# Patient Record
Sex: Female | Born: 1960 | Race: White | Hispanic: No | Smoking: Former smoker
Health system: Southern US, Community
[De-identification: ages and names within clinical notes are randomized; demographics above are authoritative.]

## PROBLEM LIST (undated history)

## (undated) ENCOUNTER — Ambulatory Visit: Admission: EM | Payer: Medicaid Other

## (undated) DIAGNOSIS — F1210 Cannabis abuse, uncomplicated: Secondary | ICD-10-CM

## (undated) DIAGNOSIS — E785 Hyperlipidemia, unspecified: Secondary | ICD-10-CM

## (undated) DIAGNOSIS — I471 Supraventricular tachycardia, unspecified: Secondary | ICD-10-CM

## (undated) DIAGNOSIS — R4189 Other symptoms and signs involving cognitive functions and awareness: Secondary | ICD-10-CM

## (undated) DIAGNOSIS — K219 Gastro-esophageal reflux disease without esophagitis: Secondary | ICD-10-CM

## (undated) DIAGNOSIS — M1990 Unspecified osteoarthritis, unspecified site: Secondary | ICD-10-CM

## (undated) DIAGNOSIS — I10 Essential (primary) hypertension: Secondary | ICD-10-CM

## (undated) DIAGNOSIS — M48061 Spinal stenosis, lumbar region without neurogenic claudication: Secondary | ICD-10-CM

## (undated) DIAGNOSIS — J189 Pneumonia, unspecified organism: Secondary | ICD-10-CM

## (undated) DIAGNOSIS — J439 Emphysema, unspecified: Secondary | ICD-10-CM

## (undated) DIAGNOSIS — R413 Other amnesia: Secondary | ICD-10-CM

## (undated) DIAGNOSIS — M4802 Spinal stenosis, cervical region: Secondary | ICD-10-CM

## (undated) DIAGNOSIS — F32A Depression, unspecified: Secondary | ICD-10-CM

## (undated) DIAGNOSIS — Z91419 Personal history of unspecified adult abuse: Secondary | ICD-10-CM

## (undated) DIAGNOSIS — F419 Anxiety disorder, unspecified: Secondary | ICD-10-CM

## (undated) DIAGNOSIS — M2011 Hallux valgus (acquired), right foot: Secondary | ICD-10-CM

## (undated) DIAGNOSIS — M797 Fibromyalgia: Secondary | ICD-10-CM

## (undated) DIAGNOSIS — R42 Dizziness and giddiness: Secondary | ICD-10-CM

## (undated) DIAGNOSIS — T8859XA Other complications of anesthesia, initial encounter: Secondary | ICD-10-CM

## (undated) DIAGNOSIS — I7090 Unspecified atherosclerosis: Secondary | ICD-10-CM

## (undated) DIAGNOSIS — D259 Leiomyoma of uterus, unspecified: Secondary | ICD-10-CM

## (undated) DIAGNOSIS — I5189 Other ill-defined heart diseases: Secondary | ICD-10-CM

## (undated) HISTORY — DX: Other amnesia: R41.3

## (undated) HISTORY — PX: LAPAROSCOPY: SHX197

## (undated) HISTORY — DX: Emphysema, unspecified: J43.9

## (undated) HISTORY — DX: Unspecified atherosclerosis: I70.90

## (undated) HISTORY — DX: Essential (primary) hypertension: I10

## (undated) HISTORY — DX: Spinal stenosis, cervical region: M48.02

## (undated) HISTORY — DX: Unspecified osteoarthritis, unspecified site: M19.90

## (undated) HISTORY — DX: Fibromyalgia: M79.7

## (undated) HISTORY — PX: MULTIPLE TOOTH EXTRACTIONS: SHX2053

## (undated) HISTORY — DX: Depression, unspecified: F32.A

## (undated) HISTORY — PX: OTHER SURGICAL HISTORY: SHX169

## (undated) HISTORY — DX: Dizziness and giddiness: R42

## (undated) HISTORY — DX: Hyperlipidemia, unspecified: E78.5

## (undated) HISTORY — DX: Spinal stenosis, lumbar region without neurogenic claudication: M48.061

## (undated) HISTORY — DX: Leiomyoma of uterus, unspecified: D25.9

## (undated) HISTORY — DX: Anxiety disorder, unspecified: F41.9

## (undated) HISTORY — PX: DILATION AND CURETTAGE OF UTERUS: SHX78

## (undated) HISTORY — DX: Personal history of unspecified adult abuse: Z91.419

## (undated) HISTORY — DX: Other symptoms and signs involving cognitive functions and awareness: R41.89

## (undated) HISTORY — PX: APPENDECTOMY: SHX54

## (undated) HISTORY — PX: COLONOSCOPY WITH ESOPHAGOGASTRODUODENOSCOPY (EGD): SHX5779

## (undated) HISTORY — PX: TONSILLECTOMY: SUR1361

---

## 1998-08-19 HISTORY — PX: APPENDECTOMY: SHX54

## 2020-01-27 ENCOUNTER — Telehealth: Payer: Self-pay | Admitting: Obstetrics and Gynecology

## 2020-01-27 ENCOUNTER — Ambulatory Visit: Payer: Self-pay | Admitting: Obstetrics and Gynecology

## 2020-01-27 ENCOUNTER — Encounter: Payer: Self-pay | Admitting: Obstetrics and Gynecology

## 2020-01-27 ENCOUNTER — Other Ambulatory Visit: Payer: Self-pay

## 2020-01-27 VITALS — BP 131/86 | HR 72 | Ht 68.5 in | Wt 145.0 lb

## 2020-01-27 DIAGNOSIS — Z01419 Encounter for gynecological examination (general) (routine) without abnormal findings: Secondary | ICD-10-CM

## 2020-01-27 DIAGNOSIS — Z7189 Other specified counseling: Secondary | ICD-10-CM

## 2020-01-27 DIAGNOSIS — Z124 Encounter for screening for malignant neoplasm of cervix: Secondary | ICD-10-CM

## 2020-01-27 DIAGNOSIS — Z1239 Encounter for other screening for malignant neoplasm of breast: Secondary | ICD-10-CM

## 2020-01-27 MED ORDER — MEDROXYPROGESTERONE ACETATE 2.5 MG PO TABS: 2.5000 mg | ORAL_TABLET | Freq: Every day | ORAL | 3 refills | Status: DC

## 2020-01-27 MED ORDER — ESTRADIOL 0.5 MG PO TABS: 0.5000 mg | ORAL_TABLET | Freq: Every day | ORAL | 3 refills | Status: DC

## 2020-01-27 NOTE — Progress Notes (Signed)
Gynecology Annual Exam  PCP: Patient, No Pcp Per  Chief Complaint:  Chief Complaint  Patient presents with  . Annual Exam    Refill on hormones, moved here from [place]    History of Present Illness:Patient is a 59 y.o. G3P0030 presents for annual exam. The patient has no complaints today.   LMP: No LMP recorded. Patient has had an ablation. Absent, postmenopausal and prior ablation.  No PMB  The patient is sexually active. She denies dyspareunia.  The patient does perform self breast exams.  There is no notable family history of breast or ovarian cancer in her family.  The patient wears seatbelts: yes.   The patient has regular exercise: not asked.    The patient denies current symptoms of depression.     Review of Systems: Review of Systems  Constitutional: Negative for chills and fever.  HENT: Negative for congestion.   Respiratory: Negative for cough and shortness of breath.   Cardiovascular: Negative for chest pain and palpitations.  Gastrointestinal: Negative for abdominal pain, constipation, diarrhea, heartburn, nausea and vomiting.  Genitourinary: Negative for dysuria, frequency and urgency.  Musculoskeletal: Positive for back pain.  Skin: Negative for itching and rash.  Neurological: Negative for dizziness and headaches.  Endo/Heme/Allergies: Negative for polydipsia.  Psychiatric/Behavioral: Negative for depression.    Past Medical History:  There are no problems to display for this patient.   Past Surgical History:  Past Surgical History:  Procedure Laterality Date  . ablation    . APPENDECTOMY    . DILATION AND CURETTAGE OF UTERUS     x3 for miscarriage  . LAPAROSCOPY     Fibroid removal    Gynecologic History:  No LMP recorded. Patient has had an ablation.  Obstetric History: G3P0030  Family History:  Family History  Problem Relation Age of Onset  . Uterine cancer Mother 67  . Lung cancer Mother   . Brain cancer Mother   . Breast cancer  Maternal Grandmother 60  . Breast cancer Paternal Grandmother 39    Social History:  Social History   Socioeconomic History  . Marital status: Married    Spouse name: Not on file  . Number of children: Not on file  . Years of education: Not on file  . Highest education level: Not on file  Occupational History  . Not on file  Tobacco Use  . Smoking status: Never Smoker  . Smokeless tobacco: Never Used  Vaping Use  . Vaping Use: Never used  Substance and Sexual Activity  . Alcohol use: Yes    Comment: Occ  . Drug use: Yes    Types: Marijuana  . Sexual activity: Yes    Partners: Male  Other Topics Concern  . Not on file  Social History Narrative  . Not on file   Social Determinants of Health   Financial Resource Strain:   . Difficulty of Paying Living Expenses:   Food Insecurity:   . Worried About Charity fundraiser in the Last Year:   . Arboriculturist in the Last Year:   Transportation Needs:   . Film/video editor (Medical):   Marland Kitchen Lack of Transportation (Non-Medical):   Physical Activity:   . Days of Exercise per Week:   . Minutes of Exercise per Session:   Stress:   . Feeling of Stress :   Social Connections:   . Frequency of Communication with Friends and Family:   . Frequency of Social Gatherings  with Friends and Family:   . Attends Religious Services:   . Active Member of Clubs or Organizations:   . Attends Archivist Meetings:   Marland Kitchen Marital Status:   Intimate Partner Violence:   . Fear of Current or Ex-Partner:   . Emotionally Abused:   Marland Kitchen Physically Abused:   . Sexually Abused:     Allergies:  No Known Allergies  Medications: Prior to Admission medications   Medication Sig Start Date End Date Taking? Authorizing Provider  dicyclomine (BENTYL) 10 MG capsule Take 10 mg by mouth 4 (four) times daily -  before meals and at bedtime.   Yes [provider]  estradiol (ESTRACE) 0.5 MG tablet Take 0.5 mg by mouth daily.   Yes  [provider]  medroxyPROGESTERone (PROVERA) 2.5 MG tablet Take 2.5 mg by mouth daily.   Yes [provider]  pantoprazole (PROTONIX) 40 MG tablet TAKE 1 TABLET BY MOUTH EVERY DAY 01/04/20  Yes [provider]    Physical Exam Vitals: Blood pressure 131/86, pulse 72, height 5' 8.5" (1.74 m), weight 145 lb (65.8 kg).  General: NAD HEENT: normocephalic, anicteric Thyroid: no enlargement, no palpable nodules Pulmonary: No increased work of breathing, CTAB Cardiovascular: RRR, distal pulses 2+ Breast: Breast symmetrical, no tenderness, no palpable nodules or masses, no skin or nipple retraction present, no nipple discharge.  No axillary or supraclavicular lymphadenopathy. Abdomen: NABS, soft, non-tender, non-distended.  Umbilicus without lesions.  No hepatomegaly, splenomegaly or masses palpable. No evidence of hernia  Genitourinary:  External: Normal external female genitalia.  Normal urethral meatus, normal Bartholin's and Skene's glands.    Vagina: Normal vaginal mucosa, no evidence of prolapse.    Cervix: Grossly normal in appearance, no bleeding  Uterus: Non-enlarged, mobile, normal contour.  No CMT  Adnexa: ovaries non-enlarged, no adnexal masses  Rectal: deferred  Lymphatic: no evidence of inguinal lymphadenopathy Extremities: no edema, erythema, or tenderness Neurologic: Grossly intact Psychiatric: mood appropriate, affect full  Female chaperone present for pelvic and breast  portions of the physical exam     Assessment: 59 y.o. G3P0030 routine annual exam  Plan: Problem List Items Addressed This Visit    None    Visit Diagnoses    Encounter for gynecological examination without abnormal finding    -  Primary   Screening for malignant neoplasm of cervix       Relevant Orders   Cytology - PAP   Breast screening       Relevant Orders   MM 3D SCREEN BREAST BILATERAL   Counseling for hormone replacement therapy          1) Mammogram -  recommend yearly screening mammogram.  Mammogram Was ordered today - will arrange for [facility] referral  2) STI screening  was notoffered and therefore not obtained  3) ASCCP guidelines and rational discussed.  Patient opts for every 5 years screening interval  4) Osteoporosis  - per USPTF routine screening DEXA at age 89  5) Routine healthcare maintenance including cholesterol, diabetes screening discussed managed by PCP  6) Colonoscopy  - reports had earlier this year at [facility]   7) We discussed WHI study findings in detail.  In the combined estrogen-progesterone arm breast cancer risk was increased by 1.26 (CI of 1.00 to 1.59), coronary heart disease 1.29 (CI 1.02-1.63), stroke risk 1.41 (1.07-1.85), and pulmonary embolism 2.13 (CI 1.39-3.25).  That being said the while statistically significant the actual number of cases attributable are relatively small at an  additional 8 cases of breast cancer, 7 more coronary artery event, 8 more strokes, and 8 additional case of pulmonary embolism per 10,000 women.  Study was terminated because of the increased breast cancer risk, this was not seen in the estrogen only arm of the study for women without an intact uterus.  In addition it is important to note that HRT also had positive or risk reducing effects, and all cause mortality between the HRT/non-HRT users is not statistically different.  Estrogen-progestin HRT decreased the relative risk of hip fracture 0.66 (CI 0.45-0.98), colorectal cancer 0.63 (0.43-0.92).  Current consensus is to limit dose to the lowest effective dose, and shortest treatment duration possible.  Breast cancer risk appeared to increase after 4 years of use.  Also important to note is that these risk refer to systemic HRT for the treatment of vasomotor symptoms, and do not apply to vaginal preperations with minimal systemic absorption and aimed at treating symptoms of vulvovaginal atrophy.    We briefly touched on findings of  WHIMS trial published in 2005 which looked at women 100 year of age or older, and whether HRT was protective against the development of dementia.  The study revealed that HRT actually increased the risk for the development of dementia but was limited by looking only at patients 19 years of age and older.  The subsequent KEEPS trial  In 2012 which looked at HRT in recently postmenopausal women did not show any improvement in cognitive function for women on HRT.  However, there was also no significant cognitive declines seen in recently postmenopausal women receiving HRT as had previously been shown in the WHIMS trial.   - patient opts to continue HRT at present  8) Return in about 1 year (around 01/26/2021) for annual.    Malachy Mood, MD [facility], [facility] 01/27/2020, 1:27 PM

## 2020-01-27 NOTE — Telephone Encounter (Signed)
Christy from the [place] at [facility] calling to see if we could contact patient to let them know they needing to contact her about needing to be with the [place] program per AMS. Alyse Low states she called multiples time to reach patient and phone number on file is not accepting call. Asking if we would attempt to reach patient. I called both phone numbers we have on file and they aren't accepting calls at this time.

## 2020-01-27 NOTE — Patient Instructions (Addendum)
[facility] [place] [address]  [facility]  [address] [place] [address]  Phone: [phone]  We discussed WHI study findings in detail.  In the combined estrogen-progesterone arm breast cancer risk was increased by 1.26 (CI of 1.00 to 1.59), coronary heart disease 1.29 (CI 1.02-1.63), stroke risk 1.41 (1.07-1.85), and pulmonary embolism 2.13 (CI 1.39-3.25).  That being said the while statistically significant the actual number of cases attributable are relatively small at an additional 8 cases of breast cancer, 7 more coronary artery event, 8 more strokes, and 8 additional case of pulmonary embolism per 10,000 women.  Study was terminated because of the increased breast cancer risk, this was not seen in the estrogen only arm of the study for women without an intact uterus.  In addition it is important to note that HRT also had positive or risk reducing effects, and all cause mortality between the HRT/non-HRT users is not statistically different.  Estrogen-progestin HRT decreased the relative risk of hip fracture 0.66 (CI 0.45-0.98), colorectal cancer 0.63 (0.43-0.92).  Current consensus is to limit dose to the lowest effective dose, and shortest treatment duration possible.  Breast cancer risk appeared to increase after 4 years of use.  Also important to note is that these risk refer to systemic HRT for the treatment of vasomotor symptoms, and do not apply to vaginal preperations with minimal systemic absorption and aimed at treating symptoms of vulvovaginal atrophy.    We briefly touched on findings of WHIMS trial published in 2005 which looked at women 4 year of age or older, and whether HRT was protective against the development of dementia.  The study revealed that HRT actually increased the risk for the development of dementia but was limited by looking only at patients 53 years of age and older.  The subsequent KEEPS trial  In 2012 which  looked at HRT in recently postmenopausal women did not show any improvement in cognitive function for women on HRT.  However, there was also no significant cognitive declines seen in recently postmenopausal women receiving HRT as had previously been shown in the [facility] trial.

## 2020-01-31 LAB — IGP, APTIMA HPV, RFX 16/18,45
HPV Aptima: NEGATIVE
PAP Smear Comment: 0

## 2020-01-31 NOTE — Telephone Encounter (Signed)
Phone number on file not accepting calls at this time. Unable to leave message

## 2020-02-08 ENCOUNTER — Ambulatory Visit: Payer: Self-pay

## 2020-02-22 ENCOUNTER — Encounter: Payer: Self-pay | Admitting: Gerontology

## 2020-02-22 ENCOUNTER — Ambulatory Visit: Payer: Self-pay | Admitting: Gerontology

## 2020-02-22 VITALS — BP 141/82 | HR 85 | Ht 68.0 in | Wt 148.5 lb

## 2020-02-22 DIAGNOSIS — M549 Dorsalgia, unspecified: Secondary | ICD-10-CM | POA: Insufficient documentation

## 2020-02-22 DIAGNOSIS — Z7689 Persons encountering health services in other specified circumstances: Secondary | ICD-10-CM | POA: Insufficient documentation

## 2020-02-22 DIAGNOSIS — G8929 Other chronic pain: Secondary | ICD-10-CM | POA: Insufficient documentation

## 2020-02-22 DIAGNOSIS — F419 Anxiety disorder, unspecified: Secondary | ICD-10-CM | POA: Insufficient documentation

## 2020-02-22 DIAGNOSIS — R109 Unspecified abdominal pain: Secondary | ICD-10-CM

## 2020-02-22 DIAGNOSIS — R232 Flushing: Secondary | ICD-10-CM | POA: Insufficient documentation

## 2020-02-22 MED ORDER — PANTOPRAZOLE SODIUM 40 MG PO TBEC: 40.0000 mg | DELAYED_RELEASE_TABLET | Freq: Every day | ORAL | 2 refills | Status: DC

## 2020-02-22 NOTE — Progress Notes (Signed)
Patient ID: Amber Livingston, female   DOB: [birth year], 59 y.o.   MRN: [id number]  Chief Complaint  Patient presents with  . 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 Spinal Stenosis    Worsens with weather    HPI Amber Livingston is a 59 y.o. female who presents to establish care and evaluation of her chronic problems. She states that after her second dose of Moderna covid vaccine on 12/16/2019, she has been experiencing palpitation,"funny feelings in her heart" and her heart tenses up sometimes. She states that she takes Magnesium and Potasium otc after her Covid vaccine and her symptoms has improved.   She also have a history of spinal stenosis and states that she experiences intermittent exacerbation of non radiating sharp 8/10 pain to upper and lower back. She states that her back "locks up" during the exacerbation episode for more than 2 weeks, and it's debilitating because she's in bed. She states that over exertion aggravate symptoms and rest relieves symptoms. She denies bowel or bladder incontinence and saddle anesthesia. She also states that she's very anxious about her back condition because she can't work and unable to support herself. She states that her mood fluctuates, but she denies suicidal nor homicidal ideation. She was seen by Ob/gyn Dr Bonney Aid A on 01/27/2020 and she was started on HRT. She reports that takeing estradiol 0.5 mg and Provera 2.5 mg moderately relieves her hot flashes. 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 She continues to c/o intermittent left upper quadrant abdominal pain, and she had Colonoscopy and EGD done on 10/11/19 at [facility]. Her EGD showedmultiple small antral erosions. biopsyfor hp done normal duodenum per Dr Betti Cruz D  And Colonoscopy, showedtotal of three polyps 3-4 mm in the rectosigmoid, removed with hot and cold snare.  otherwise normal colon. She continues on 40 mg Protonix daily and  reports that she's worried about her chronic abdominal pain. She continues to smoke 1/2 pack of cigarette daily and admits the desire to quit. Overall, she states that she's doing well and offers no further complaint.       Past Medical History:  Diagnosis Date  . Uterine fibroid     Past Surgical History:  Procedure Laterality Date  . ablation    . APPENDECTOMY    . DILATION AND CURETTAGE OF UTERUS     x3 for miscarriage  . LAPAROSCOPY     Fibroid removal    Family History  Problem Relation Age of Onset  . Uterine cancer Mother 20  . Lung cancer Mother   . Brain cancer Mother   . Breast cancer Maternal Grandmother 60  . Breast cancer Paternal Grandmother 32    Social History Social History   Tobacco Use  . Smoking status: Current Every Day Smoker    Packs/day: 0.50    Types: Cigarettes  . Smokeless tobacco: Never Used  Vaping Use  . Vaping Use: Never used  Substance Use Topics  . Alcohol use: Not Currently    Comment: Occ  . Drug use: Yes    Types: Marijuana    No Known Allergies  Current Outpatient Medications  Medication Sig Dispense Refill  . dicyclomine (BENTYL) 10 MG capsule Take 10 mg by mouth 4 (four) times daily -  before meals and at bedtime.    Marland Kitchen estradiol (ESTRACE) 0.5 MG tablet Take 1 tablet (0.5 mg total) by mouth daily. 90 tablet  3  . medroxyPROGESTERone (PROVERA) 2.5 MG tablet Take 1 tablet (2.5 mg total) by mouth daily. 90 tablet 3  . ondansetron (ZOFRAN) 4 MG tablet Take 4 mg by mouth as needed for nausea or vomiting.    . pantoprazole (PROTONIX) 40 MG tablet TAKE 1 TABLET BY MOUTH EVERY DAY     No current facility-administered medications for this visit.    Review of Systems Review of Systems  Constitutional: Negative.   HENT: Negative.   Eyes: Negative.   Gastrointestinal: Positive for abdominal pain.  Endocrine: Negative.   Genitourinary: Negative.   Musculoskeletal: Positive for back pain (spinal stenosis).  Skin: Negative.    Neurological: Negative.   Psychiatric/Behavioral: The patient is nervous/anxious.     Blood pressure (!) 141/82, pulse 85, height 5\' 8"  (1.727 m), weight 148 lb 8 oz (67.4 kg), SpO2 97 %.  Physical Exam Physical Exam Constitutional:      Appearance: Normal appearance.  HENT:     Head: Normocephalic and atraumatic.     Nose: Nose normal.     Mouth/Throat:     Mouth: Mucous membranes are moist.  Eyes:     Extraocular Movements: Extraocular movements intact.     Pupils: Pupils are equal, round, and reactive to light.  Cardiovascular:     Rate and Rhythm: Normal rate and regular rhythm.     Pulses: Normal pulses.     Heart sounds: Normal heart sounds.  Pulmonary:     Effort: Pulmonary effort is normal.     Breath sounds: Normal breath sounds.  Abdominal:     General: Abdomen is flat. Bowel sounds are normal.     Palpations: Abdomen is soft.     Tenderness: There is abdominal tenderness (mild tenderness on palpation to left upper quadrant).  Genitourinary:    Comments: Deferred per patient. Musculoskeletal:        General: Tenderness (mild tenderness to mid upper and lower back with palpation) present.     Cervical back: Normal range of motion.  Skin:    General: Skin is warm.  Neurological:     General: No focal deficit present.     Mental Status: She is alert and oriented to person, place, and time. Mental status is at baseline.  Psychiatric:        Mood and Affect: Mood normal.        Behavior: Behavior normal.        Thought Content: Thought content normal.        Judgment: Judgment normal.     Data Reviewed Lab and past medical history was reviewed.  Assessment and Plan  1. Encounter to establish care  - Ambulatory referral to Hematology / Oncology for Mammogram/ Routine labs will be checked. - CBC w/Diff; Future - Comp Met (CMET); Future - Lipid panel; Future - TSH; Future - Urinalysis; Future  2. Chronic abdominal pain - She will continue on Protonix  and was advised to complete [facility] financial application for Gastroenterology referral. - pantoprazole (PROTONIX) 40 MG tablet; Take 1 tablet (40 mg total) by mouth daily.  Dispense: 30 tablet; Refill: 2 - Ambulatory referral to Gastroenterology  3. Hot flashes - She will continue on current treatment regimen and will follow up with - Ambulatory referral to Obstetrics / Gynecology  4. Chronic back pain, unspecified back location, unspecified back pain laterality - She will follow up with [facility].  5. Anxiety - She will follow up with Ms. Simpson for mental health  evaluation. - She was advised to call Crisis help line or go to the ED for worsening symptoms.     Arkie Tagliaferro E Bless Lisenby 02/22/2020, 11:45 AM

## 2020-02-24 ENCOUNTER — Telehealth: Payer: Self-pay | Admitting: Obstetrics and Gynecology

## 2020-02-24 NOTE — Telephone Encounter (Signed)
Scheduled appt with Heather.

## 2020-02-29 ENCOUNTER — Other Ambulatory Visit: Payer: Self-pay

## 2020-02-29 ENCOUNTER — Ambulatory Visit: Payer: Self-pay | Admitting: Specialist

## 2020-02-29 ENCOUNTER — Ambulatory Visit: Payer: Self-pay | Admitting: Licensed Clinical Social Worker

## 2020-02-29 DIAGNOSIS — F411 Generalized anxiety disorder: Secondary | ICD-10-CM

## 2020-02-29 DIAGNOSIS — F341 Dysthymic disorder: Secondary | ICD-10-CM

## 2020-02-29 DIAGNOSIS — G8929 Other chronic pain: Secondary | ICD-10-CM

## 2020-02-29 NOTE — Progress Notes (Signed)
   Subjective:    Patient ID: Amber Livingston, female    DOB: 12/11/1960, 59 y.o.   MRN: [id number]  HPI 59 year old who provides a 15 year history of back pain previously diagnosed as spinal stenosis.   She is a poor historian going off on tangents such as she's had adult fatigue syndrome, fibro myalgia, her lack of insurance, etc. For the past month, her back pain is worse for no apparent reason. Fells her back has locked up. When asked  About ridiculer pain, gain its hard to get a straight answer.  Today she rates her pain as 10+ on a scale from 1-10.  We observed her smoking before coming into the clinic.  Review of Systems     Objective:   Physical Exam Her gait is normal. She's able to heel/toe walk. She accomplishes tandem gait. She does a full squat and rises in a monophasic action  When sitting, DTRs 2-3+ and symmetrical. Theres in clonus.   Sensation intact bilaterally.  MMT +5/5 in all muscle groups.   Sitting SLR neg. at 90 degrees  ROM she will forward flex until her fingers are approx. 6 inch from floor. She has 30 of EXT.  Lateral flex 20 degrees bilaterally with verbal complaints  Downward compression of her shoulders enblock, ROT reproduces no pain       Assessment & Plan:  Send her for x-rays to rule out any underlying serious condition.

## 2020-02-29 NOTE — BH Specialist Note (Signed)
Integrated Behavioral Health Comprehensive Clinical Assessment Via Phone  MRN: [id number] Name: Amber Livingston   Type of Service: Integrated Behavioral Health-Individual Interpretor: No. Interpretor Name and Language: not applicable.   PRESENTING CONCERNS: Amber Livingston is a 59 y.o. female accompanied by herself. Amber Livingston was referred to [facility] clinician for mental health.  Previous mental health services Have you ever been treated for a mental health problem? Yes If "Yes", when were you treated and whom did you see? Onesty reports that she saw her OB/Gyn for anxiety and was prescribed ativan and xanax.  Have you ever been hospitalized for mental health treatment? No Have you ever been treated for any of the following? Past Psychiatric History/Hospitalization(s): Anxiety: Yes, Anaia describes feeling nervous,anxious, and on edge daily, inability to stop or control her worrying, worrying too much, restless, difficulty relaxing, becomes easily  annoyed and irritable, and has trouble concentrating.  Bipolar Disorder: No Depression: Yes, Amber Livingston repots feeling down, depressed, hopeless, loss of interest in previously enjoyed  Activities, insomnia, fatigue, poor appetite, difficulty concentrating, and difficulty relaxing. She denies suicidal and homicidal thoughts.     Mania: Amber Livingston made the comment more than one regarding episodes of Mania and clinician forgot to followup on statement  Psychosis: No Schizophrenia: No Personality Disorder: Negative Hospitalization for psychiatric illness: No History of Electroconvulsive Shock Therapy: Negative Prior Suicide Attempts: Negative Have you ever had thoughts of harming yourself or others or attempted suicide? No plan to harm self or others  Medical history  has a past medical history of Uterine fibroid. Primary Care Physician: Amber Mood, MD Date of last physical exam:  Allergies: No Known Allergies Current medications:   Outpatient Encounter Medications as of 02/29/2020  Medication Sig  . dicyclomine (BENTYL) 10 MG capsule Take 10 mg by mouth 4 (four) times daily -  before meals and at bedtime.  Marland Kitchen estradiol (ESTRACE) 0.5 MG tablet Take 1 tablet (0.5 mg total) by mouth daily.  . medroxyPROGESTERone (PROVERA) 2.5 MG tablet Take 1 tablet (2.5 mg total) by mouth daily.  . ondansetron (ZOFRAN) 4 MG tablet Take 4 mg by mouth as needed for nausea or vomiting.  . pantoprazole (PROTONIX) 40 MG tablet Take 1 tablet (40 mg total) by mouth daily.   No facility-administered encounter medications on file as of 02/29/2020.   Have you ever had any serious medication reactions? No Is there any history of mental health problems or substance abuse in your family? Yes- Maurice intially reported that there was no history of mental illness in her family but later during the assessment made a statement that her dad had dementia.  Has anyone in your family been hospitalized for mental health treatment? No  Social/family history Who lives in your current household? Amber Livingston resides with her boyfriend of the last 15 years.  What is your family of origin, childhood history?  Where were you born?  Where did you grow up? How many different homes have you lived in?  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 Yes- Amber Livingston is one of five children she is the second oldest. What are their names, relation, sex, age? Are your parents separated or divorced? No.  What are your social supports? [name] states her boyfriend and aunt are her support system.  Education How many grades have you completed? 12th grade Did you have any problems in school? No  Employment/financial issues Danaiya stated she has an  employment history of working in [employer]. Sleep Usual bedtime varies. Sleeping arrangements: Rease shares bed with significant other.  Problems with  snoring: Not known Obstructive sleep apnea is not a concern. Problems with nightmares: No Problems with night terrors: No Problems with sleepwalking: No  Trauma/Abuse history Have you ever experienced or been exposed to any form of abuse? No Have you ever experienced or been exposed to something traumatic? No  Substance use Do you use alcohol, nicotine or caffeine? [name] smokes 1/2 pack of cigarettes per day.  How old were you when you first tasted alcohol?  Have you ever used illicit drugs or abused prescription medications? cocaine and marijuana [name] reported history of cocaine use in the 90's and states she currently uses marijuana in the mornings to cope with her symptoms of pain and anxiety.  Mental status General appearance/Behavior: Unable to assess patient's appearance due to assessment conducted over the phone.  Eye contact: Absent due to assessment conducted over the phone.  Motor behavior: Unknown assessment conducted over the phone.  Speech: Normal Level of consciousness: Alert Livingston: Depressed, Hopeless, Irritable and tearful.  Affect: Appropriate Anxiety level: Moderate Thought process: Coherent Thought content: WNL Perception: Normal Judgment: Fair Insight: Present  Diagnosis No diagnosis found.  GOALS ADDRESSED: Patient will reduce symptoms of: anxiety, depression and insomnia and increase knowledge and/or ability of: coping skills, healthy habits, self-management skills and stress reduction and also: Increase healthy adjustment to current life circumstances              INTERVENTIONS: Interventions utilized: Biopsychosocial assessment completed.  Standardized Assessments completed: GAD-7 and PHQ 9   ASSESSMENT/OUTCOME:  [name] is a 59 year old caucasian female who presents today for a mental health assessment via phone due to [place] and was referred by S. Conan Bowens, M.D. Amber Livingston has been experiencing symptoms of depression and anxiety for the last few years. She  states that a month ago she began having severe back pain that has impacted her mobility. Since that time Amber Livingston states that her symptoms of depression and anxiety have worsened. She denies any previous hospitalizations for mental illness. Kairy states she ws previously perscribed xanax and ativan but quit taking them in 2017. And takes no other psychotropic medications. Yakira admits to abusing cocaine 20 years ago she further admits to smoking marijuana daily.   Jerrica is an established patient of [facility]. She has a history of chronic back pain, chronic abdominal pain, anxiety. Surgical history includes: appendectomy, laparoscopy, fibroid removal, ablation, dilation and curettage x 3 for miscarriage. She had a colonoscopy on 10/11/2019. She has no known allergies or adverse drug reactions.  Korynne relocated 8 months ago to [place] from [place]. She currently lives with her boyfriend of 15 years. Zairah has three brothers and one sister all estranged. She has experienced the loss of her mother, December 2020, and her father January 2021. Additionally Etta's best friend recently died from [place]. Her support system includes her boyfriend and her aunt. Ethyl denies any history of mental illness in her family or substance abuse. However, Oris did state that her father had dementia.     PLAN:  Case consultation with Dr. Octavia Heir on Tuesday 03/07/2020 Scheduled next visit: Follow up in one week or earlier if needed Assessment completed by Jerrilyn Cairo, BSW Intern  [facility]

## 2020-03-02 ENCOUNTER — Other Ambulatory Visit: Payer: Self-pay

## 2020-03-02 DIAGNOSIS — Z7689 Persons encountering health services in other specified circumstances: Secondary | ICD-10-CM

## 2020-03-03 ENCOUNTER — Ambulatory Visit
Admission: RE | Admit: 2020-03-03 | Discharge: 2020-03-03 | Disposition: A | Discharge status: Home / Self Care | Payer: Self-pay | Attending: Specialist | Admitting: Specialist

## 2020-03-03 ENCOUNTER — Ambulatory Visit
Admission: RE | Admit: 2020-03-03 | Discharge: 2020-03-03 | Disposition: A | Discharge status: Home / Self Care | Payer: Self-pay | Source: Ambulatory Visit | Attending: Specialist | Admitting: Specialist

## 2020-03-03 DIAGNOSIS — M549 Dorsalgia, unspecified: Secondary | ICD-10-CM | POA: Insufficient documentation

## 2020-03-03 DIAGNOSIS — G8929 Other chronic pain: Secondary | ICD-10-CM | POA: Insufficient documentation

## 2020-03-03 LAB — CBC WITH DIFFERENTIAL/PLATELET
Basophils Absolute: 0.1 10*3/uL (ref 0.0–0.2)
Basos: 1 %
EOS (ABSOLUTE): 0.3 10*3/uL (ref 0.0–0.4)
Eos: 5 %
Hematocrit: 36.7 % (ref 34.0–46.6)
Hemoglobin: 13.1 g/dL (ref 11.1–15.9)
Immature Grans (Abs): 0 10*3/uL (ref 0.0–0.1)
Immature Granulocytes: 0 %
Lymphocytes Absolute: 2 10*3/uL (ref 0.7–3.1)
Lymphs: 36 %
MCH: 32.3 pg (ref 26.6–33.0)
MCHC: 35.7 g/dL (ref 31.5–35.7)
MCV: 91 fL (ref 79–97)
Monocytes Absolute: 0.4 10*3/uL (ref 0.1–0.9)
Monocytes: 8 %
Neutrophils Absolute: 2.8 10*3/uL (ref 1.4–7.0)
Neutrophils: 50 %
Platelets: 279 10*3/uL (ref 150–450)
RBC: 4.05 x10E6/uL (ref 3.77–5.28)
RDW: 12 % (ref 11.7–15.4)
WBC: 5.5 10*3/uL (ref 3.4–10.8)

## 2020-03-03 LAB — URINALYSIS
Bilirubin, UA: NEGATIVE
Glucose, UA: NEGATIVE
Ketones, UA: NEGATIVE
Leukocytes,UA: NEGATIVE
Nitrite, UA: NEGATIVE
Protein,UA: NEGATIVE
RBC, UA: NEGATIVE
Specific Gravity, UA: 1.014 (ref 1.005–1.030)
Urobilinogen, Ur: 0.2 mg/dL (ref 0.2–1.0)
pH, UA: 5 (ref 5.0–7.5)

## 2020-03-03 LAB — COMPREHENSIVE METABOLIC PANEL
ALT: 23 IU/L (ref 0–32)
AST: 14 IU/L (ref 0–40)
Albumin/Globulin Ratio: 2 (ref 1.2–2.2)
Albumin: 4.7 g/dL (ref 3.8–4.9)
Alkaline Phosphatase: 72 IU/L (ref 48–121)
BUN/Creatinine Ratio: 18 (ref 9–23)
BUN: 13 mg/dL (ref 6–24)
Bilirubin Total: 0.5 mg/dL (ref 0.0–1.2)
CO2: 21 mmol/L (ref 20–29)
Calcium: 9.5 mg/dL (ref 8.7–10.2)
Chloride: 106 mmol/L (ref 96–106)
Creatinine, Ser: 0.74 mg/dL (ref 0.57–1.00)
GFR calc Af Amer: 103 mL/min/{1.73_m2} (ref >59)
GFR calc non Af Amer: 89 mL/min/{1.73_m2} (ref >59)
Globulin, Total: 2.3 g/dL (ref 1.5–4.5)
Glucose: 80 mg/dL (ref 65–99)
Potassium: 4.1 mmol/L (ref 3.5–5.2)
Sodium: 139 mmol/L (ref 134–144)
Total Protein: 7 g/dL (ref 6.0–8.5)

## 2020-03-03 LAB — LIPID PANEL
Chol/HDL Ratio: 3.8 ratio (ref 0.0–4.4)
Cholesterol, Total: 249 mg/dL — ABNORMAL HIGH (ref 100–199)
HDL: 66 mg/dL (ref >39)
LDL Chol Calc (NIH): 168 mg/dL — ABNORMAL HIGH (ref 0–99)
Triglycerides: 89 mg/dL (ref 0–149)
VLDL Cholesterol Cal: 15 mg/dL (ref 5–40)

## 2020-03-03 LAB — TSH: TSH: 0.863 u[IU]/mL (ref 0.450–4.500)

## 2020-03-07 ENCOUNTER — Ambulatory Visit: Payer: Self-pay | Admitting: Licensed Clinical Social Worker

## 2020-03-07 ENCOUNTER — Ambulatory Visit: Payer: Self-pay | Admitting: Gerontology

## 2020-03-07 ENCOUNTER — Other Ambulatory Visit: Payer: Self-pay

## 2020-03-07 DIAGNOSIS — F341 Dysthymic disorder: Secondary | ICD-10-CM

## 2020-03-07 DIAGNOSIS — F1220 Cannabis dependence, uncomplicated: Secondary | ICD-10-CM

## 2020-03-07 DIAGNOSIS — F411 Generalized anxiety disorder: Secondary | ICD-10-CM

## 2020-03-07 DIAGNOSIS — F1421 Cocaine dependence, in remission: Secondary | ICD-10-CM

## 2020-03-07 MED ORDER — TRAZODONE HCL 50 MG PO TABS
ORAL_TABLET | ORAL | 1 refills | Status: DC
Start: 2020-03-07 — End: 2020-06-06

## 2020-03-07 NOTE — BH Specialist Note (Signed)
Integrated Behavioral Health Follow Up Visit Via Phone  MRN: [id number] Name: Amber Livingston   Type of Service: [place] Interpretor:No. Interpretor Name and Language: not applicable.   SUBJECTIVE: Amber Livingston is a 59 y.o. female accompanied by Self Patient was referred by S.Conan Bowens, MD for Anxiety. Patient reports the following symptoms/concerns: back pain and anxiety. Patient currently experiencing depression and anxiety. However, reports her pain level has decreased over the last couple of days and feels a little better.  Patient remembered being prescribed Ambien in the past but only took it for two days because she had no memory while on the medication. Patient was receptive and agreed with the recommendations. Patient spoke of situational stressors she is experiencing since relocating to [place] from [place] in addition to acceptance of the shift in her long term intimate relationship to a platonic friendship. Of note during the session in response to clinician patient did recall past law enforcement encounter, 15 years ago, due to cocaine use. She denies suicidal and homicidal thoughts.  Duration of problem:  One month; Severity of problem: moderate  OBJECTIVE: Mood: Appropriate and Affect: Normal Risk of harm to self or others: No plan to harm self or others  LIFE CONTEXT: Family and Social:  School/Work:  Self-Care:  Life Changes:   GOALS ADDRESSED: Patient will: 1.  Reduce symptoms of: anxiety and depression  2.  Increase knowledge and/or ability of: coping skills, healthy habits and Substance abuse education  3.  Demonstrate ability to: Increase healthy adjustment to current life circumstances, Increase adequate support systems for patient/family and Decrease self-medicating behaviors  INTERVENTIONS: Interventions utilized:  Supportive Counseling was utilized by the clinician during today's follow up session. Clinician relayed case  consultation findings to the patient. Clinician reviewed some things that the patient mentioned during her initial assessment and to clarify things that were not initially asked.  Standardized Assessments completed: Not Needed  ASSESSMENT: Case consultation with Dr. Octavia Heir, MD, psychiatric consultant on Tuesday July 20th @ 9 am who recommended that the patient start Trazodone 50 mg PO BID and 100 mg PO QHS to treat symptoms insomnia, depression, and anxiety. Clinician informed the patient that she may experience dizziness on the Trazodone to keep an eye on that and let her know if it worsens. In addition, psychiatric consultant recommended that patient participate in weekly therapy sessions.   PLAN: 1. Follow up with behavioral health clinician on : 03/16/2020 2. Behavioral recommendations: Trazodone 100 mg at bedtime and  trazodone 50 mg twice daily, weekly therapy  3. Referral(s): None  Note and session completed by Jerrilyn Cairo, BSW Candidate.  Bayard Hugger, LCSW

## 2020-03-08 ENCOUNTER — Ambulatory Visit: Payer: Self-pay | Admitting: Pharmacy Technician

## 2020-03-08 DIAGNOSIS — Z79899 Other long term (current) drug therapy: Secondary | ICD-10-CM

## 2020-03-08 NOTE — Progress Notes (Signed)
Completed financial assistance application for [place] due to recent hospital visit.  Patient agreed to be responsible for gathering financial information and forwarding to appropriate department in [facility].    Completed [facility] application and contract.  Patient agreed to all terms of the [facility] contract.  Patient has appointment at [facility] on 03/09/20.  Systems analyst, DOH Attestation & Patient Advocate Letter to [facility] for patient to sign.    Patient approved to receive medication assistance at [facility] until time for re-certification in 4944, and as long as eligibility criteria continues to be met.    Provided patient with community resource material based on her particular needs.    [facility]

## 2020-03-09 ENCOUNTER — Other Ambulatory Visit: Payer: Self-pay

## 2020-03-09 ENCOUNTER — Ambulatory Visit: Payer: Self-pay | Admitting: Gerontology

## 2020-03-09 ENCOUNTER — Encounter: Payer: Self-pay | Admitting: Gerontology

## 2020-03-09 VITALS — BP 118/76 | HR 70 | Temp 98.0°F | Resp 16 | Ht 67.0 in | Wt 149.8 lb

## 2020-03-09 DIAGNOSIS — G8929 Other chronic pain: Secondary | ICD-10-CM

## 2020-03-09 DIAGNOSIS — E785 Hyperlipidemia, unspecified: Secondary | ICD-10-CM | POA: Insufficient documentation

## 2020-03-09 MED ORDER — DICYCLOMINE HCL 10 MG PO CAPS: 10.0000 mg | ORAL_CAPSULE | Freq: Three times a day (TID) | ORAL | 0 refills | Status: DC

## 2020-03-09 NOTE — Progress Notes (Signed)
Established Patient Office Visit  Subjective:  Patient ID: Nalanie Winiecki, female    DOB: 10-28-1960  Age: 59 y.o. MRN: [id number]  CC: No chief complaint on file.   HPI Nonnie Pickney is a 59 y.o female who presents for follow up of hypertension and lab review. She reports being compliant with her treatment regimen. Her recent lipid panel lab done 03/02/2020 showed a total Cholesterol of 249mg /dL with an LDL of 168mg /dL. She verbalized that decreasing the number of cigarettes she smokes. She reports smoking about 10 cigarette per day and indicate interest quitting. She was seen by Julian Hy, LCSW on 03/07/2020 for mental health. She was seen by Dr. Vickki Hearing on 02/29/2020 for back pain and was sent for X-ray which showed normal alignment with no fracture. Imaging showed the presence of early disc space narrowing and anterior spurring in the mid and lower lumbar spine. 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 Rolm Baptise. She denies chest pain, shortness of breath or dyspnea with exertion. Overall, she states that she is doing well and offer no further complaint.       Past Medical History:  Diagnosis Date  . Uterine fibroid     Past Surgical History:  Procedure Laterality Date  . ablation    . APPENDECTOMY    . DILATION AND CURETTAGE OF UTERUS     x3 for miscarriage  . LAPAROSCOPY     Fibroid removal    Family History  Problem Relation Age of Onset  . Uterine cancer Mother 82  . Lung cancer Mother   . Brain cancer Mother   . Breast cancer Maternal Grandmother 60  . Breast cancer Paternal Grandmother 38    Social History   Socioeconomic History  . Marital status: Married    Spouse name: Not on file  . Number of children: Not on file  . Years of education: Not on file  . Highest education level: Not on file  Occupational History  . Not on file  Tobacco Use  . Smoking status: Current Every Day Smoker    Packs/day:  0.50    Types: Cigarettes  . Smokeless tobacco: Never Used  Vaping Use  . Vaping Use: Never used  Substance and Sexual Activity  . Alcohol use: Not Currently    Comment: Occ  . Drug use: Yes    Types: Marijuana  . Sexual activity: Yes    Partners: Male  Other Topics Concern  . Not on file  Social History Narrative  . Not on file   Social Determinants of Health   Financial Resource Strain:   . Difficulty of Paying Living Expenses:   Food Insecurity:   . Worried About Charity fundraiser in the Last Year:   . Arboriculturist in the Last Year:   Transportation Needs:   . Film/video editor (Medical):   Marland Kitchen Lack of Transportation (Non-Medical):   Physical Activity:   . Days of Exercise per Week:   . Minutes of Exercise per Session:   Stress:   . Feeling of Stress :   Social Connections:   . Frequency of Communication with Friends and Family:   . Frequency of Social Gatherings with Friends and Family:   . Attends Religious Services:   . Active Member of Clubs or Organizations:   . Attends Archivist Meetings:   Marland Kitchen Marital Status:   Intimate Partner Violence:   . Fear  of Current or Ex-Partner:   . Emotionally Abused:   Marland Kitchen Physically Abused:   . Sexually Abused:     Outpatient Medications Prior to Visit  Medication Sig Dispense Refill  . estradiol (ESTRACE) 0.5 MG tablet Take 1 tablet (0.5 mg total) by mouth daily. 90 tablet 3  . medroxyPROGESTERone (PROVERA) 2.5 MG tablet Take 1 tablet (2.5 mg total) by mouth daily. 90 tablet 3  . ondansetron (ZOFRAN) 4 MG tablet Take 4 mg by mouth as needed for nausea or vomiting.    . pantoprazole (PROTONIX) 40 MG tablet Take 1 tablet (40 mg total) by mouth daily. 30 tablet 2  . traZODone (DESYREL) 50 MG tablet Take 50 mg twice a day and 100 mg at bedtime. 120 tablet 1  . dicyclomine (BENTYL) 10 MG capsule Take 10 mg by mouth 4 (four) times daily -  before meals and at bedtime.     No facility-administered medications  prior to visit.    No Known Allergies  ROS Review of Systems  Constitutional: Negative.   Respiratory: Negative.   Cardiovascular: Negative.   Gastrointestinal: Negative.   Endocrine: Negative.   Genitourinary: Negative.   Musculoskeletal: Positive for back pain (Chronic back pain).  Skin: Negative.   Neurological: Negative.   Hematological: Negative.   Psychiatric/Behavioral: Negative.       Objective:    Physical Exam Constitutional:      Appearance: Normal appearance.  HENT:     Head: Normocephalic and atraumatic.  Cardiovascular:     Rate and Rhythm: Normal rate and regular rhythm.     Pulses: Normal pulses.     Heart sounds: Normal heart sounds.  Pulmonary:     Effort: Pulmonary effort is normal.     Breath sounds: Normal breath sounds.  Abdominal:     General: Abdomen is flat.  Neurological:     General: No focal deficit present.     Mental Status: She is alert and oriented to person, place, and time.  Psychiatric:        Mood and Affect: Mood normal.        Behavior: Behavior normal.        Thought Content: Thought content normal.        Judgment: Judgment normal.     BP 118/76 (BP Location: Right Arm, Patient Position: Sitting, Cuff Size: Normal)   Pulse 70   Temp 98 F (36.7 C) (Oral)   Resp 16   Ht 5\' 7"  (1.702 m)   Wt 149 lb 12.8 oz (67.9 kg)   SpO2 98%   BMI 23.46 kg/m  Wt Readings from Last 3 Encounters:  03/09/20 149 lb 12.8 oz (67.9 kg)  02/22/20 148 lb 8 oz (67.4 kg)  01/27/20 145 lb (65.8 kg)     Health Maintenance Due  Topic Date Due  . Hepatitis C Screening  Never done  . HIV Screening  Never done  . TETANUS/TDAP  Never done  . MAMMOGRAM  Never done    There are no preventive care reminders to display for this patient.  Lab Results  Component Value Date   TSH 0.863 03/02/2020   Lab Results  Component Value Date   WBC 5.5 03/02/2020   HGB 13.1 03/02/2020   HCT 36.7 03/02/2020   MCV 91 03/02/2020   PLT 279  03/02/2020   Lab Results  Component Value Date   NA 139 03/02/2020   K 4.1 03/02/2020   CO2 21 03/02/2020   GLUCOSE 80 03/02/2020  BUN 13 03/02/2020   CREATININE 0.74 03/02/2020   BILITOT 0.5 03/02/2020   ALKPHOS 72 03/02/2020   AST 14 03/02/2020   ALT 23 03/02/2020   PROT 7.0 03/02/2020   ALBUMIN 4.7 03/02/2020   CALCIUM 9.5 03/02/2020   Lab Results  Component Value Date   CHOL 249 (H) 03/02/2020   Lab Results  Component Value Date   HDL 66 03/02/2020   Lab Results  Component Value Date   LDLCALC 168 (H) 03/02/2020   Lab Results  Component Value Date   TRIG 89 03/02/2020   Lab Results  Component Value Date   CHOLHDL 3.8 03/02/2020   No results found for: HGBA1C    Assessment & Plan:   1. Chronic back pain, unspecified back location, unspecified back pain laterality She will continue to follow up with Dr. Vickki Hearing   2. Chronic abdominal pain She will continue current treatment regimen. - dicyclomine (BENTYL) 10 MG capsule; Take 1 capsule (10 mg total) by mouth 4 (four) times daily -  before meals and at bedtime.  Dispense: 90 capsule; Refill: 0, She would follow up with Gastroenterology   3. Elevated lipids Her 10 year ASCVD risk was 8.1%. She was advised to continue low fat/cholesterol diet and exercise as tolerated. She was also advised on smoking cessation.     Follow-up: Return in about 5 weeks (around 04/13/2020), or if symptoms worsen or fail to improve.    Carney Corners, RN

## 2020-03-16 ENCOUNTER — Other Ambulatory Visit: Payer: Self-pay

## 2020-03-16 ENCOUNTER — Ambulatory Visit: Payer: Self-pay | Admitting: Licensed Clinical Social Worker

## 2020-03-16 DIAGNOSIS — F411 Generalized anxiety disorder: Secondary | ICD-10-CM

## 2020-03-16 DIAGNOSIS — F1220 Cannabis dependence, uncomplicated: Secondary | ICD-10-CM

## 2020-03-16 DIAGNOSIS — F341 Dysthymic disorder: Secondary | ICD-10-CM

## 2020-03-16 NOTE — BH Specialist Note (Addendum)
Integrated Behavioral Health Follow Up Visit Via Phone  MRN: [id number] Name: Amber Livingston  Number of [place] Clinician visits: 2/6  Type of Service: [place] Interpretor:No. Interpretor Name and Language: not applicable.   SUBJECTIVE: Amber Livingston is a 59 y.o. female accompanied by self Patient was referred by [name], NP for depression and anxiety. Patient reports the following symptoms/concerns:   She feels worried about rising COVID numbers especially considering she is immunocomprimised. She reports being in less pain over the last week and feels as a result her anxiety has also improved. However, she states she still feels irritable most of the time. [name] states she feels bad that she can not contribute more financially to the household she shares with her friend.She is going to apply for SNAP benefits this week.  She states that when she went to pick up her prescription from medication Management last week, the pharmacist advised her to not take the Trazodone as directed and to start with one dose and build up over time until she reaches the prescribed dosage. She states even after the one dose she feels very tired. She denies any suicidal or homicidal thoughts. Duration of problem: ; Severity of problem: moderate  OBJECTIVE: Mood: Depressed and Affect: Blunt Risk of harm to self or others: No plan to harm self or others  LIFE CONTEXT: Family and Social: See above School/Work: Patient does not want to file for disability Self-Care:  Life Changes:   GOALS ADDRESSED: Patient will: 1.  Reduce symptoms of: agitation, anxiety and depression  2.  Increase knowledge and/or ability of: adhering to plan of care 3.  Demonstrate ability to: Increase healthy adjustment to current life circumstances and Improve medication compliance  INTERVENTIONS: Interventions utilized:  Supportive Counseling was utilized by the clinician  during today's follow up session.Clinincian prossessed with patient how she has been doing since the last follow up session. The clinician discussed the importance of medication compliance and that Trazodone could take 4-6 weeks to reach the intended effect. She explored with patient her previous substance abuse history and explained the importance of assessing how this has impacted her symptoms of depression and anxiety. Clinician measured patients symptoms of anxiety and depression on a numerical scale.  Standardized Assessments completed: GAD-7 and PHQ 9 GAD 7 = 16 PHQ 9 = 17 ASSESSMENT: Patient currently experiencing see above.   Patient may benefit from see above.  PLAN: 1. Follow up with behavioral health clinician on : 2. Behavioral recommendations: see above.  3. Referral(s): [place] (In Clinic) 4. "From scale of 1-10, how likely are you to follow plan?":  Intern Jerrilyn Cairo, BSW candidate did the session and dictated note for patient. Clinician Julian Hy, LCSW signed off on it.  Bayard Hugger, LCSW

## 2020-03-28 ENCOUNTER — Ambulatory Visit: Payer: Self-pay | Admitting: Specialist

## 2020-03-28 ENCOUNTER — Other Ambulatory Visit: Payer: Self-pay

## 2020-03-28 DIAGNOSIS — G8929 Other chronic pain: Secondary | ICD-10-CM

## 2020-03-28 NOTE — Progress Notes (Signed)
   Subjective:    Patient ID: Amber Livingston, female    DOB: April 22, 1961, 59 y.o.   MRN: [id number]  HPI 59 year old is here for a follow for a back. She stopped one of her medications and feels tremendously improved, although still complains of her lower back "locking up".  Review of her x rays shows that they look very good. 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 She tells me her last cholesterol was high and she will follow up with her PCP.   As far as her back, I'd recommend she consider seeing a chiropractor. Unfortunately, we no longer have one associated with this clinic. Return here only if something new starts to happen.   Review of Systems     Objective:   Physical Exam        Assessment & Plan:  Return PRN

## 2020-03-29 ENCOUNTER — Encounter: Payer: Self-pay | Admitting: Obstetrics and Gynecology

## 2020-03-29 ENCOUNTER — Ambulatory Visit (INDEPENDENT_AMBULATORY_CARE_PROVIDER_SITE_OTHER): Payer: Self-pay | Admitting: Obstetrics and Gynecology

## 2020-03-29 ENCOUNTER — Other Ambulatory Visit: Payer: Self-pay | Admitting: Obstetrics and Gynecology

## 2020-03-29 VITALS — BP 97/65 | HR 63 | Ht 67.0 in | Wt 148.5 lb

## 2020-03-29 DIAGNOSIS — Z79890 Hormone replacement therapy (postmenopausal): Secondary | ICD-10-CM

## 2020-03-29 DIAGNOSIS — N951 Menopausal and female climacteric states: Secondary | ICD-10-CM

## 2020-03-29 MED ORDER — MEDROXYPROGESTERONE ACETATE 2.5 MG PO TABS: 2.5000 mg | ORAL_TABLET | Freq: Every day | ORAL | 3 refills | Status: DC

## 2020-03-29 MED ORDER — ESTRADIOL 0.5 MG PO TABS: 0.5000 mg | ORAL_TABLET | Freq: Every day | ORAL | 3 refills | Status: DC

## 2020-03-29 NOTE — Progress Notes (Signed)
HPI:      [name] is a 59 y.o. G3P0030 who LMP was No LMP recorded. Patient has had an ablation.  Subjective:   She presents today because she is taking HRT and she is "about to run out".  She states that she has run out before and she has significant hot flashes and does not want this to happen. She saw Dr. Georgianne Fick approximately 3 months ago for annual examination and Pap smear.  She has now been referred here for HRT follow-up. She states "I am overdue for my mammogram". States that she had an endometrial ablation in 2012 for heavy irregular bleeding and pelvic pain and that this solved many of her problems at that time. She began HRT approximately 2015.    Hx: The following portions of the patient's history were reviewed and updated as appropriate:             She  has a past medical history of Uterine fibroid. She does not have any pertinent problems on file. She  has a past surgical history that includes Dilation and curettage of uterus; laparoscopy; Appendectomy; and ablation. Her family history includes Brain cancer in her mother; Breast cancer (age of onset: 39) in her maternal grandmother and paternal grandmother; Lung cancer in her mother; Uterine cancer (age of onset: 95) in her mother. She  reports that she has been smoking cigarettes. She has been smoking about 0.50 packs per day. She has never used smokeless tobacco. She reports previous alcohol use. She reports current drug use. Drug: Marijuana. She has a current medication list which includes the following prescription(s): dicyclomine, estradiol, medroxyprogesterone, ondansetron, pantoprazole, and trazodone. She has No Known Allergies.       Review of Systems:  Review of Systems  Constitutional: Denied constitutional symptoms, night sweats, recent illness, fatigue, fever, insomnia and weight loss.  Eyes: Denied eye symptoms, eye pain, photophobia, vision change and visual disturbance.  Ears/Nose/Throat/Neck: Denied  ear, nose, throat or neck symptoms, hearing loss, nasal discharge, sinus congestion and sore throat.  Cardiovascular: Denied cardiovascular symptoms, arrhythmia, chest pain/pressure, edema, exercise intolerance, orthopnea and palpitations.  Respiratory: Denied pulmonary symptoms, asthma, pleuritic pain, productive sputum, cough, dyspnea and wheezing.  Gastrointestinal: Denied, gastro-esophageal reflux, melena, nausea and vomiting.  Genitourinary: Denied genitourinary symptoms including symptomatic vaginal discharge, pelvic relaxation issues, and urinary complaints.  Musculoskeletal: Denied musculoskeletal symptoms, stiffness, swelling, muscle weakness and myalgia.  Dermatologic: Denied dermatology symptoms, rash and scar.  Neurologic: Denied neurology symptoms, dizziness, headache, neck pain and syncope.  Psychiatric: Denied psychiatric symptoms, anxiety and depression.  Endocrine: See HPI for additional information.   Meds:   Current Outpatient Medications on File Prior to Visit  Medication Sig Dispense Refill  . dicyclomine (BENTYL) 10 MG capsule Take 1 capsule (10 mg total) by mouth 4 (four) times daily -  before meals and at bedtime. 90 capsule 0  . ondansetron (ZOFRAN) 4 MG tablet Take 4 mg by mouth as needed for nausea or vomiting.    . pantoprazole (PROTONIX) 40 MG tablet Take 1 tablet (40 mg total) by mouth daily. 30 tablet 2  . traZODone (DESYREL) 50 MG tablet Take 50 mg twice a day and 100 mg at bedtime. 120 tablet 1   No current facility-administered medications on file prior to visit.    Objective:     Vitals:   03/29/20 1100  BP: 97/65  Pulse: 63  Assessment:    G3P0030 Patient Active Problem List   Diagnosis Date Noted  . Elevated lipids 03/09/2020  . Encounter to establish care 02/22/2020  . Chronic abdominal pain 02/22/2020  . Hot flashes 02/22/2020  . Chronic back pain 02/22/2020  . Anxiety 02/22/2020     1. Symptomatic menopausal or  female climacteric states   2. Postmenopausal hormone therapy     Patient would like to continue her HRT as she knows she becomes quite symptomatic without it.   Plan:            1.  Continue HRT  2.  Patient instructed on how to obtain a mammogram through [facility]  3.  Follow-up for annual examination in approximately 9 months. Orders No orders of the defined types were placed in this encounter.    Meds ordered this encounter  Medications  . estradiol (ESTRACE) 0.5 MG tablet    Sig: Take 1 tablet (0.5 mg total) by mouth daily.    Dispense:  90 tablet    Refill:  3  . medroxyPROGESTERone (PROVERA) 2.5 MG tablet    Sig: Take 1 tablet (2.5 mg total) by mouth daily.    Dispense:  90 tablet    Refill:  3      F/U  Return in about 9 months (around 12/27/2020) for Annual Physical. I spent 31 minutes involved in the care of this patient preparing to see the patient by obtaining and reviewing her medical history (including labs, imaging tests and prior procedures), documenting clinical information in the electronic health record (EHR), counseling and coordinating care plans, writing and sending prescriptions, ordering tests or procedures and directly communicating with the patient by discussing pertinent items from her history and physical exam as well as detailing my assessment and plan as noted above so that she has an informed understanding.  All of her questions were answered.  Finis Bud, M.D. 03/29/2020 11:33 AM

## 2020-03-30 ENCOUNTER — Other Ambulatory Visit: Payer: Self-pay

## 2020-03-30 ENCOUNTER — Ambulatory Visit: Payer: Self-pay | Admitting: Licensed Clinical Social Worker

## 2020-04-06 ENCOUNTER — Ambulatory Visit: Payer: Self-pay | Admitting: Gerontology

## 2020-04-06 VITALS — BP 104/75 | HR 67 | Resp 18 | Wt 144.6 lb

## 2020-04-06 DIAGNOSIS — G8929 Other chronic pain: Secondary | ICD-10-CM

## 2020-04-06 NOTE — Patient Instructions (Signed)

## 2020-04-06 NOTE — Progress Notes (Signed)
Established Patient Office Visit  Subjective:  Patient ID: Amber Livingston, female    DOB: [birth year]  Age: 59 y.o. MRN: [id number]  CC: No chief complaint on file.   HPI Amber Livingston is 59 y.o female who presents for follow up of chronic lower back pain which has been ongoing. Pain is located around her hip and lower back. Pain is rated 10/10 and described as non-radiating nagging pain. She states that pain is worse with ambulation. She reports that nothing makes the pain better. She was seen by Dr. Vickki Hearing 03/28/2020 who recommended for her to see a chiropractor. She verbalized that her pain has gotten progressively worse since her last visit. She denies bladder or bowel incontinence, and saddle anesthesia. Overall, she states that she is doing well, except for her debilitating chronic back pain and offers no further complaint.    Past Medical History:  Diagnosis Date  . Uterine fibroid     Past Surgical History:  Procedure Laterality Date  . ablation    . APPENDECTOMY    . DILATION AND CURETTAGE OF UTERUS     x3 for miscarriage  . LAPAROSCOPY     Fibroid removal    Family History  Problem Relation Age of Onset  . Uterine cancer Mother 52  . Lung cancer Mother   . Brain cancer Mother   . Breast cancer Maternal Grandmother 60  . Breast cancer Paternal Grandmother 71    Social History   Socioeconomic History  . Marital status: Married    Spouse name: Not on file  . Number of children: Not on file  . Years of education: Not on file  . Highest education level: Not on file  Occupational History  . Not on file  Tobacco Use  . Smoking status: Current Every Day Smoker    Packs/day: 0.50    Types: Cigarettes  . Smokeless tobacco: Never Used  Vaping Use  . Vaping Use: Never used  Substance and Sexual Activity  . Alcohol use: Not Currently    Comment: Occ  . Drug use: Yes    Types: Marijuana  . Sexual activity: Yes    Partners: Male  Other Topics Concern  . Not on file   Social History Narrative  . Not on file   Social Determinants of Health   Financial Resource Strain:   . Difficulty of Paying Living Expenses: Not on file  Food Insecurity:   . Worried About Charity fundraiser in the Last Year: Not on file  . Ran Out of Food in the Last Year: Not on file  Transportation Needs:   . Lack of Transportation (Medical): Not on file  . Lack of Transportation (Non-Medical): Not on file  Physical Activity:   . Days of Exercise per Week: Not on file  . Minutes of Exercise per Session: Not on file  Stress:   . Feeling of Stress : Not on file  Social Connections:   . Frequency of Communication with Friends and Family: Not on file  . Frequency of Social Gatherings with Friends and Family: Not on file  . Attends Religious Services: Not on file  . Active Member of Clubs or Organizations: Not on file  . Attends Archivist Meetings: Not on file  . Marital Status: Not on file  Intimate Partner Violence:   . Fear of Current or Ex-Partner: Not on file  . Emotionally Abused: Not on file  . Physically Abused: Not on file  . Sexually  Abused: Not on file    Outpatient Medications Prior to Visit  Medication Sig Dispense Refill  . estradiol (ESTRACE) 0.5 MG tablet Take 1 tablet (0.5 mg total) by mouth daily. 90 tablet 3  . medroxyPROGESTERone (PROVERA) 2.5 MG tablet Take 1 tablet (2.5 mg total) by mouth daily. 90 tablet 3  . ondansetron (ZOFRAN) 4 MG tablet Take 4 mg by mouth as needed for nausea or vomiting.    . traZODone (DESYREL) 50 MG tablet Take 50 mg twice a day and 100 mg at bedtime. 120 tablet 1  . dicyclomine (BENTYL) 10 MG capsule Take 1 capsule (10 mg total) by mouth 4 (four) times daily -  before meals and at bedtime. (Patient not taking: Reported on 04/06/2020) 90 capsule 0  . pantoprazole (PROTONIX) 40 MG tablet Take 1 tablet (40 mg total) by mouth daily. (Patient not taking: Reported on 04/06/2020) 30 tablet 2   No facility-administered  medications prior to visit.    No Known Allergies  ROS Review of Systems  Constitutional: Negative.   Respiratory: Negative.   Cardiovascular: Negative.   Gastrointestinal: Negative.   Endocrine: Negative.   Genitourinary: Negative.   Musculoskeletal: Positive for back pain (.Chronic back pain being followed by Dr. Vickki Hearing ).  Neurological: Negative.   Psychiatric/Behavioral: Negative.       Objective:    Physical Exam Constitutional:      Appearance: Normal appearance.  HENT:     Head: Normocephalic and atraumatic.  Cardiovascular:     Rate and Rhythm: Normal rate.     Pulses: Normal pulses.     Heart sounds: Normal heart sounds.  Pulmonary:     Effort: Pulmonary effort is normal.     Breath sounds: Normal breath sounds.  Neurological:     General: No focal deficit present.     Mental Status: She is alert and oriented to person, place, and time.  Psychiatric:        Mood and Affect: Mood normal.        Behavior: Behavior normal.        Thought Content: Thought content normal.        Judgment: Judgment normal.     BP 104/75 (BP Location: Left Arm, Patient Position: Sitting, Cuff Size: Normal)   Pulse 67   Resp 18   Wt 144 lb 9.6 oz (65.6 kg)   SpO2 98%   BMI 22.65 kg/m  Wt Readings from Last 3 Encounters:  04/06/20 144 lb 9.6 oz (65.6 kg)  03/29/20 148 lb 8 oz (67.4 kg)  03/09/20 149 lb 12.8 oz (67.9 kg)     Health Maintenance Due  Topic Date Due  . Hepatitis C Screening  Never done  . HIV Screening  Never done  . TETANUS/TDAP  Never done  . MAMMOGRAM  Never done  . INFLUENZA VACCINE  03/19/2020    There are no preventive care reminders to display for this patient.  Lab Results  Component Value Date   TSH 0.863 03/02/2020   Lab Results  Component Value Date   WBC 5.5 03/02/2020   HGB 13.1 03/02/2020   HCT 36.7 03/02/2020   MCV 91 03/02/2020   PLT 279 03/02/2020   Lab Results  Component Value Date   NA 139 03/02/2020   K 4.1  03/02/2020   CO2 21 03/02/2020   GLUCOSE 80 03/02/2020   BUN 13 03/02/2020   CREATININE 0.74 03/02/2020   BILITOT 0.5 03/02/2020   ALKPHOS 72 03/02/2020  AST 14 03/02/2020   ALT 23 03/02/2020   PROT 7.0 03/02/2020   ALBUMIN 4.7 03/02/2020   CALCIUM 9.5 03/02/2020   Lab Results  Component Value Date   CHOL 249 (H) 03/02/2020   Lab Results  Component Value Date   HDL 66 03/02/2020   Lab Results  Component Value Date   LDLCALC 168 (H) 03/02/2020   Lab Results  Component Value Date   TRIG 89 03/02/2020   Lab Results  Component Value Date   CHOLHDL 3.8 03/02/2020   No results found for: HGBA1C    Assessment & Plan:   1. Chronic back pain, unspecified back location, unspecified back pain laterality She complained of worsening chronic back pain and thus, will be referred to both orthopedic and pain management clinic. - Ambulatory referral to Orthopedic Surgery - Ambulatory referral to Pain Clinic     Follow-up: Return in about 3 months (around 07/05/2020), or if symptoms worsen or fail to improve.    Carney Corners, RN

## 2020-04-13 ENCOUNTER — Ambulatory Visit: Payer: Self-pay | Admitting: Gerontology

## 2020-04-18 ENCOUNTER — Encounter: Payer: Self-pay | Admitting: Gastroenterology

## 2020-04-18 ENCOUNTER — Ambulatory Visit (INDEPENDENT_AMBULATORY_CARE_PROVIDER_SITE_OTHER): Payer: Self-pay | Admitting: Gastroenterology

## 2020-04-18 ENCOUNTER — Other Ambulatory Visit: Payer: Self-pay

## 2020-04-18 VITALS — BP 123/83 | HR 74 | Temp 97.7°F | Ht 67.0 in | Wt 142.8 lb

## 2020-04-18 DIAGNOSIS — G8929 Other chronic pain: Secondary | ICD-10-CM

## 2020-04-18 DIAGNOSIS — R109 Unspecified abdominal pain: Secondary | ICD-10-CM

## 2020-04-18 NOTE — Progress Notes (Signed)
Amber Livingston [address]  [place]  [place], [place] [address]  Main: [phone]  Fax: [phone]   Gastroenterology Consultation  Referring Provider:     Langston Reusing, NP Primary Care Physician:  Malachy Mood, MD Reason for Consultation: Abdominal pain         HPI:    Chief Complaint  Patient presents with  . Abdominal Pain    Amber Livingston is a 59 y.o. y/o female referred for consultation & management  by Dr. Malachy Mood, MD. Patient with chronic abdominal pain issues, fibromyalgia presents with several questions about her abdominal pain. Also reports different life stressors affecting her pain. Reports having an initial endoscopy in 2010 and states she was diagnosed with an infection and was treated at that time. Location left upper quadrant, dull, nonradiating, 5/10. Episodic. Does not vary with meals. Does get better with dicyclomine. Has changed her diet to plant-based diet and that has helped.  Patient previously seen by [facility], this year, underwent EGD and colonoscopy in February 2021, see care everywhere.  Patient reported multiple small antral erosions.  Biopsies negative for H. pylori.  Recommendations were to avoid NSAID and PPI 40 mg p.o. once daily.  Colonoscopy with 3 subcentimeter polyps removed and showed sessile serrated adenoma and hyperplastic polyps.  They also ordered a fecal pancreatic elastase which was normal.  Their office notes also report of normal liver enzymes they did order further work-up.  Smooth muscle antibody normal.  Mitochondrial antibody normal.  Celiac serology negative  Most recent liver enzymes in July 2021 completely normal    Past Medical History:  Diagnosis Date  . Uterine fibroid     Past Surgical History:  Procedure Laterality Date  . ablation    . APPENDECTOMY    . DILATION AND CURETTAGE OF UTERUS     x3 for miscarriage  . LAPAROSCOPY     Fibroid removal    Prior to Admission  medications   Medication Sig Start Date End Date Taking? Authorizing Provider  dicyclomine (BENTYL) 10 MG capsule Take 1 capsule (10 mg total) by mouth 4 (four) times daily -  before meals and at bedtime. Patient not taking: Reported on 04/06/2020 03/09/20   Iloabachie, Chioma E, NP  estradiol (ESTRACE) 0.5 MG tablet Take 1 tablet (0.5 mg total) by mouth daily. 03/29/20   Harlin Heys, MD  medroxyPROGESTERone (PROVERA) 2.5 MG tablet Take 1 tablet (2.5 mg total) by mouth daily. 03/29/20   Harlin Heys, MD  ondansetron (ZOFRAN) 4 MG tablet Take 4 mg by mouth as needed for nausea or vomiting.    [provider]  pantoprazole (PROTONIX) 40 MG tablet Take 1 tablet (40 mg total) by mouth daily. Patient not taking: Reported on 04/06/2020 02/22/20   Iloabachie, Chioma E, NP  traZODone (DESYREL) 50 MG tablet Take 50 mg twice a day and 100 mg at bedtime. 03/07/20   Iloabachie, Chioma E, NP    Family History  Problem Relation Age of Onset  . Uterine cancer Mother 66  . Lung cancer Mother   . Brain cancer Mother   . Breast cancer Maternal Grandmother 60  . Breast cancer Paternal Grandmother 23     Social History   Tobacco Use  . Smoking status: Current Every Day Smoker    Packs/day: 0.50    Types: Cigarettes  . Smokeless tobacco: Never Used  Vaping Use  . Vaping Use: Never used  Substance Use Topics  . Alcohol  use: Not Currently    Comment: Occ  . Drug use: Yes    Types: Marijuana    Allergies as of 04/18/2020  . (No Known Allergies)    Review of Systems:    All systems reviewed and negative except where noted in HPI.   Physical Exam:  There were no vitals taken for this visit. No LMP recorded. Patient has had an ablation. Psych:  Alert and cooperative. Normal mood and affect. General:   Alert,  Well-developed, well-nourished, pleasant and cooperative in NAD Head:  Normocephalic and atraumatic. Eyes:  Sclera clear, no icterus.   Conjunctiva pink. Ears:  Normal  auditory acuity. Nose:  No deformity, discharge, or lesions. Mouth:  No deformity or lesions,oropharynx pink & moist. Neck:  Supple; no masses or thyromegaly. Abdomen:  Normal bowel sounds.  No bruits.  Soft, non-tender and non-distended without masses, hepatosplenomegaly or hernias noted.  No guarding or rebound tenderness.    Msk:  Symmetrical without gross deformities. Good, equal movement & strength bilaterally. Pulses:  Normal pulses noted. Extremities:  No clubbing or edema.  No cyanosis. Neurologic:  Alert and oriented x3;  grossly normal neurologically. Skin:  Intact without significant lesions or rashes. No jaundice. Lymph Nodes:  No significant cervical adenopathy. Psych:  Alert and cooperative. Normal mood and affect.   Labs: CBC    Component Value Date/Time   WBC 5.5 03/02/2020 1846   RBC 4.05 03/02/2020 1846   HGB 13.1 03/02/2020 1846   HCT 36.7 03/02/2020 1846   PLT 279 03/02/2020 1846   MCV 91 03/02/2020 1846   MCH 32.3 03/02/2020 1846   MCHC 35.7 03/02/2020 1846   RDW 12.0 03/02/2020 1846   LYMPHSABS 2.0 03/02/2020 1846   EOSABS 0.3 03/02/2020 1846   BASOSABS 0.1 03/02/2020 1846   CMP     Component Value Date/Time   NA 139 03/02/2020 1846   K 4.1 03/02/2020 1846   CL 106 03/02/2020 1846   CO2 21 03/02/2020 1846   GLUCOSE 80 03/02/2020 1846   BUN 13 03/02/2020 1846   CREATININE 0.74 03/02/2020 1846   CALCIUM 9.5 03/02/2020 1846   PROT 7.0 03/02/2020 1846   ALBUMIN 4.7 03/02/2020 1846   AST 14 03/02/2020 1846   ALT 23 03/02/2020 1846   ALKPHOS 72 03/02/2020 1846   BILITOT 0.5 03/02/2020 1846   GFRNONAA 89 03/02/2020 1846   GFRAA 103 03/02/2020 1846    Imaging Studies: No results found.  Assessment and Plan:   Amber Livingston is a 59 y.o. y/o female has been referred for abdominal pain  Patient has had a complete work-up for her abdominal plain including recent upper and lower endoscopy with antral erosions reported. She does smoke but is trying to  quit and I have encouraged her to quit and discussed that smoking can delay healing of erosions.  She was prescribed PPI once daily at the time of her previous endoscopy and has a few pills left that she uses as needed. We did not refill it at this time  Okay to use dicyclomine as needed  We discussed that her abdominal pain is likely functional given that it is chronic and since she took PPI as prescribed previously, those antral erosions have likely healed  Her labs are otherwise reassuring as well  I have encouraged her to work with her PCP and psychiatry on life stressors as that would help with her abdominal pain. Her pain does get worse when she is more stressed.   She  had several questions which were all answered to her satisfaction  If pain does not get better, follow-up with [place] further at that time  No indication for repeat endoscopy at this time   Dr Amber Livingston  Speech recognition software was used to dictate the above note.

## 2020-04-25 ENCOUNTER — Ambulatory Visit (INDEPENDENT_AMBULATORY_CARE_PROVIDER_SITE_OTHER): Payer: Self-pay | Admitting: Orthopaedic Surgery

## 2020-04-25 ENCOUNTER — Encounter: Payer: Self-pay | Admitting: Orthopaedic Surgery

## 2020-04-25 VITALS — Ht 67.0 in | Wt 143.0 lb

## 2020-04-25 DIAGNOSIS — G8929 Other chronic pain: Secondary | ICD-10-CM

## 2020-04-25 DIAGNOSIS — M4807 Spinal stenosis, lumbosacral region: Secondary | ICD-10-CM

## 2020-04-25 DIAGNOSIS — M549 Dorsalgia, unspecified: Secondary | ICD-10-CM

## 2020-04-25 MED ORDER — PREDNISONE 5 MG (21) PO TBPK: ORAL_TABLET | ORAL | 0 refills | Status: DC

## 2020-04-25 NOTE — Progress Notes (Signed)
Office Visit Note   Patient: Amber Livingston           Date of Birth: [birth year]           MRN: [id number] Visit Date: 04/25/2020              Requested by: Langston Reusing, NP [place],  [place] [address] PCP: Malachy Mood, MD   Assessment & Plan: Visit Diagnoses:  1. Chronic back pain, unspecified back location, unspecified back pain laterality     Plan: Patient rates her pain at greater than 8 out of 10.  Prednisone Dosepak sent in we will set her up for lumbar MRI scan to rule out compression.  Office follow-up after scan for review.  Follow-Up Instructions: Office follow-up after lumbar MRI scan.  Orders:  No orders of the defined types were placed in this encounter.  No orders of the defined types were placed in this encounter.     Procedures: No procedures performed   Clinical Data: No additional findings.   Subjective: Chief Complaint  Patient presents with  . Lower Back - Pain    HPI 59 year old female is moved here to [place] of [place] with past diagnosis of cervical stenosis.  She states she has had some discomfort in her back in the past but over the last 2 months she has had significant increase in back pain.  She states she feels like she has to drag her leg and has no quality of life.  Plain radiographs demonstrated some aortic calcification with past history of smoking.  Negative for acute lumbar changes.  States she has back pain leg weakness great difficulty sitting or standing.  She denies specific neurogenic claudication symptoms.  She has back pain left buttocks pain worse than right that radiates into her leg.  Patient describes a long list of nutritional supplements that she is taken to help with inflammation which has not been helpful with her symptoms.  Review of Systems positive for reported history of cervical stenosis.  She is on hormone treatment for hot flashes has chronic anxiety back pain and  hyperlipidemia otherwise noncontributory to HPI.   Objective: Vital Signs: Ht 5\' 7"  (1.702 m)   Wt 143 lb (64.9 kg)   BMI 22.40 kg/m   Physical Exam Constitutional:      Appearance: She is well-developed.  HENT:     Head: Normocephalic.     Right Ear: External ear normal.     Left Ear: External ear normal.  Eyes:     Pupils: Pupils are equal, round, and reactive to light.  Neck:     Thyroid: No thyromegaly.     Trachea: No tracheal deviation.  Cardiovascular:     Rate and Rhythm: Normal rate.  Pulmonary:     Effort: Pulmonary effort is normal.  Abdominal:     Palpations: Abdomen is soft.  Skin:    General: Skin is warm and dry.  Neurological:     Mental Status: She is alert and oriented to person, place, and time.  Psychiatric:        Behavior: Behavior normal.     Ortho Exam patient has negative logroll the hips she has pain with straight leg raising positive up to compression test bilateral sciatic notch tenderness worse on the left than right minimal trochanteric bursal tenderness anterior tib EHL is intact she is able to heel and toe walk.  She has been back pain  with forward flexion fingertips to ankle no pain with lumbar extension.  Knee and ankle jerk are intact anterior tib EHL takes good resistance.  No gastrocsoleus weakness no peroneal weakness. Specialty Comments:  No specialty comments available.  Imaging: No results found.   PMFS History: Patient Active Problem List   Diagnosis Date Noted  . Elevated lipids 03/09/2020  . Encounter to establish care 02/22/2020  . Chronic abdominal pain 02/22/2020  . Hot flashes 02/22/2020  . Chronic back pain 02/22/2020  . Anxiety 02/22/2020   Past Medical History:  Diagnosis Date  . Uterine fibroid     Family History  Problem Relation Age of Onset  . Uterine cancer Mother 78  . Lung cancer Mother   . Brain cancer Mother   . Breast cancer Maternal Grandmother 60  . Breast cancer Paternal Grandmother 45      Past Surgical History:  Procedure Laterality Date  . ablation    . APPENDECTOMY    . DILATION AND CURETTAGE OF UTERUS     x3 for miscarriage  . LAPAROSCOPY     Fibroid removal   Social History   Occupational History  . Not on file  Tobacco Use  . Smoking status: Current Every Day Smoker    Packs/day: 0.25    Types: Cigarettes  . Smokeless tobacco: Never Used  Vaping Use  . Vaping Use: Never used  Substance and Sexual Activity  . Alcohol use: Not Currently    Comment: Occ  . Drug use: Yes    Types: Marijuana  . Sexual activity: Yes    Partners: Male

## 2020-04-26 ENCOUNTER — Ambulatory Visit: Payer: Self-pay | Admitting: Licensed Clinical Social Worker

## 2020-04-26 ENCOUNTER — Other Ambulatory Visit: Payer: Self-pay

## 2020-04-26 DIAGNOSIS — F1220 Cannabis dependence, uncomplicated: Secondary | ICD-10-CM

## 2020-04-26 DIAGNOSIS — F341 Dysthymic disorder: Secondary | ICD-10-CM

## 2020-04-26 DIAGNOSIS — F411 Generalized anxiety disorder: Secondary | ICD-10-CM

## 2020-04-26 NOTE — BH Specialist Note (Signed)
Integrated Behavioral Health Follow Up Visit  MRN: [id number] Name: Amber Livingston    Type of Service: [place] Interpretor:No. Interpretor Name and Language: none  SUBJECTIVE: Amber Livingston is a 59 y.o. female accompanied by herself Patient was referred by Amber Shadow, NP For mental health. Patient reports the following symptoms/concerns: The patient reported spending three days in bed because of pain when Amber Livingston passed though. She states that overall, when the weather isn't bad she feels better. She notes that when her pain level is down so are her anxiety and depression symptoms. Patient expresses concern about having," heart spasms since she received the [place] shot last April." She states she is scheduled to speak to a physician in October about her heart and is worried what they will say. Amber Livingston reported that she still has difficulty falling asleep and took 50 mg of Trazodone last night to help. She stated that she did sleep all night but woke up crying in pain. The patient reported that she only takes the Trazodone when she feels like she needs it. She expressed concerns over COVID numbers rising however, she is using TV, art, and coloring as ways to combat the loneliness and worrying.  The patient denies suicidal or homicidal thoughts.      Duration of problem: ; Severity of problem: moderate  OBJECTIVE: Mood: Euthymic and Affect: Blunt Risk of harm to self or others: No plan to harm self or others  LIFE CONTEXT: Family and Social: see above School/Work: see above Self-Care: see above Life Changes: see above  GOALS ADDRESSED: Patient will: 1.  Reduce symptoms of: agitation, anxiety and depression  2.  Increase knowledge and/or ability of: coping skills and healthy habits  3.  Demonstrate ability to: Increase healthy adjustment to current life circumstances  INTERVENTIONS: Interventions utilized:  Supportive Counseling was  utilized by the clinician during today's follow up session. Clinician processed with patient how she has been doing since the last follow up session. Clinician utilized reflective listening encouraging the patient to ventilate her feelings regarding her current life circumstances. Clinician offered praise to the patient for using her hobbies of art, coloring as forms of self care. Clinician encouraged patient to continue to use  her coping skills and practice self care. Further, the clinician encouraged the patient to adhere to the care plan by taking her medication as prescribed. Clinician measured the patients anxiety and depression on a numerical scale.  Standardized Assessments completed: GAD-7 and PHQ 9  Gad-7 16    PHQ-9 15  ASSESSMENT: Patient currently experiencing see above  Patient may benefit from see above  PLAN: 1. Follow up with behavioral health clinician on : 05/10/2020 at 2:00  pm 2. Behavioral recommendations:  3. Referral(s): Amber Livingston (In Clinic) 4. "From scale of 1-10, how likely are you to follow plan?":   Amber Livingston, Student-Social Work

## 2020-05-10 ENCOUNTER — Ambulatory Visit: Payer: Self-pay | Admitting: Licensed Clinical Social Worker

## 2020-05-10 ENCOUNTER — Other Ambulatory Visit: Payer: Self-pay

## 2020-05-10 DIAGNOSIS — F341 Dysthymic disorder: Secondary | ICD-10-CM

## 2020-05-10 DIAGNOSIS — F411 Generalized anxiety disorder: Secondary | ICD-10-CM

## 2020-05-10 NOTE — BH Specialist Note (Signed)
Integrated Behavioral Health Follow Up Visit  MRN: [id number] Name: Amber Livingston  Type of Service: [place] Interpretor:No. Interpretor Name and Language:none  SUBJECTIVE: Amber Livingston is a 59 y.o. female accompanied by herself Patient was referred by [name], NP for mental health  Patient reports the following symptoms/concerns: The patient reports her best friend of 24 years is on Hospice in [place] and is expected to pass soon. She states that she has been depressed and down about her friend. She states that her pain levels are up due to the daily rain we have been having lately. She states it is hard for her to get out of bed because her pain is so severe. The patient reports that is is anxious about what her MRI tomorrow will reveal about her back pain. She is tired of no one in the medical field believing she is suffering and just needs answers. The patient requested that today's session remain brief so she could continue to grieve for her friend. The patient denied any suicidal or homicidal thoughts.  Duration of problem: ; Severity of problem: moderate  OBJECTIVE: Mood: Depressed and Affect: Appropriate Risk of harm to self or others: No plan to harm self or others  LIFE CONTEXT: Family and Social: see above School/Work: see [place] Self-Care: see above Life Changes: see above  GOALS ADDRESSED: Patient will: 1.  Reduce symptoms of: anxiety, depression and stress  2.  Increase knowledge and/or ability of: coping skills, healthy habits and stress reduction  3.  Demonstrate ability to: Increase healthy adjustment to current life circumstances  INTERVENTIONS: Interventions utilized:  Supportive Counseling was utilized during today's follow up session. The clinician processed with the patient how she has been doing since her last follow up session. The clinician allowed space for the patient to ventilate her frustrations regarding her current  circumstances. The clinician encouraged the patient to take her prescribed medication on time to reach its full intended effect. The clinician offered her condolences for the impending loss of her friend.  Standardized Assessments completed: GAD-7 and PHQ 9  Gad-7 17 PHQ-9 16   ASSESSMENT: Patient currently experiencing see above  Patient may benefit from see above  PLAN: 1. Follow up with behavioral health clinician on :05/24/2020 at 12:00 PM  2. Behavioral recommendations:  3. Referral(s): [place] (In Clinic) 4. "From scale of 1-10, how likely are you to follow plan?":   Amber Livingston, Student-Social Work

## 2020-05-11 ENCOUNTER — Other Ambulatory Visit: Payer: Self-pay

## 2020-05-11 ENCOUNTER — Ambulatory Visit
Admission: RE | Admit: 2020-05-11 | Discharge: 2020-05-11 | Disposition: A | Discharge status: Home / Self Care | Payer: Self-pay | Source: Ambulatory Visit | Attending: Orthopaedic Surgery | Admitting: Orthopaedic Surgery

## 2020-05-11 DIAGNOSIS — M4807 Spinal stenosis, lumbosacral region: Secondary | ICD-10-CM | POA: Insufficient documentation

## 2020-05-12 ENCOUNTER — Ambulatory Visit: Payer: Self-pay

## 2020-05-16 ENCOUNTER — Encounter: Payer: Self-pay | Admitting: Orthopaedic Surgery

## 2020-05-16 ENCOUNTER — Ambulatory Visit (INDEPENDENT_AMBULATORY_CARE_PROVIDER_SITE_OTHER): Payer: Self-pay | Admitting: Orthopaedic Surgery

## 2020-05-16 VITALS — BP 108/67 | HR 57

## 2020-05-16 DIAGNOSIS — M549 Dorsalgia, unspecified: Secondary | ICD-10-CM

## 2020-05-16 DIAGNOSIS — G8929 Other chronic pain: Secondary | ICD-10-CM

## 2020-05-16 NOTE — Progress Notes (Signed)
Office Visit Note   Patient: Amber Livingston           Date of Birth: 10-28-1960           MRN: [id number] Visit Date: 05/16/2020              Requested by: Malachy Mood, [address] [place],  [place] [address] PCP: Malachy Mood, MD   Assessment & Plan: Visit Diagnoses:  1. Chronic back pain, unspecified back location, unspecified back pain laterality     Plan: Patient's MRI scan shows some trace retrolisthesis with disc degeneration lumbar spine and mild facet arthrosis without significant foraminal or central compression. We discussed a walking program 2 miles per day resuming core strengthening exercises.  Tylenol or occasional ibuprofen for pain.  We discussed indications for operative intervention.  She states that she switched to a vegetarian diet she feels like she is significantly decreased inflammation in her system and has been able to lose weight and is feeling stronger.  She can follow-up here on an as-needed basis.  We reviewed images today of the MRI and I gave her a copy of her report.  I discussed with her I recommend against narcotic medication chronically.  Core strengthening and exercise program would be her best choice. Follow-Up Instructions: Return if symptoms worsen or fail to improve.   Orders:  No orders of the defined types were placed in this encounter.  No orders of the defined types were placed in this encounter.     Procedures: No procedures performed   Clinical Data: No additional findings.   Subjective: Chief Complaint  Patient presents with  . Lower Back - Pain    HPI 58 year old female returns with ongoing problems with chronic low back pain.  She takes Tylenol for pain she states at times has been severe.  She is also taken some trazodone in the past.  She complains of increased pain when the weather changes and rain is forecasted.  Problems with passing of her good friend recently with associated situational depression.   Patient had delayed starting a yoga program until seen today for review of MRI scan.  Review of Systems 14 point system update unchanged from 04/25/2020.  Patient report has self-reported history of cervical stenosis.   Objective: Vital Signs: BP 108/67   Pulse (!) 57   Physical Exam Constitutional:      Appearance: She is well-developed.  HENT:     Head: Normocephalic.     Right Ear: External ear normal.     Left Ear: External ear normal.  Eyes:     Pupils: Pupils are equal, round, and reactive to light.  Neck:     Thyroid: No thyromegaly.     Trachea: No tracheal deviation.  Cardiovascular:     Rate and Rhythm: Normal rate.  Pulmonary:     Effort: Pulmonary effort is normal.  Abdominal:     Palpations: Abdomen is soft.  Skin:    General: Skin is warm and dry.  Neurological:     Mental Status: She is alert and oriented to person, place, and time.  Psychiatric:        Behavior: Behavior normal.     Ortho Exam negative logroll the hips.  She is able to heel and toe walk.  She has good cervical range of motion with rotation 70 degrees right and left.    Specialty Comments:  No specialty comments available.  Imaging: CLINICAL DATA:  Spinal stenosis of lumbosacral region.  Spinal stenosis, lumbosacral. Additional history provided by scanning technologist: Patient reports pain radiating to right and left leg since June, difficulty walking.  EXAM: MRI LUMBAR SPINE WITHOUT CONTRAST  TECHNIQUE: Multiplanar, multisequence MR imaging of the lumbar spine was performed. No intravenous contrast was administered.  COMPARISON:  Lumbar spine radiographs 03/03/2020.  FINDINGS: Segmentation: Transitional lumbosacral anatomy is identified. For the purposes of this dictation, the L5 vertebra is the transitional level. There is a well-formed intervertebral disc space with a left-sided (and possibly right-sided) assimilation joint at the L5-S1 level.  Alignment:  Straightening of the expected lumbar lordosis. Trace L2-L3, L3-L4 and L4-L5 grade 1 retrolisthesis  Vertebrae: Vertebral body height is maintained. Trace degenerative endplate edema at W9-U0.  Conus medullaris and cauda equina: Conus extends to the L1 level. No signal abnormality within the visualized distal spinal cord.  Paraspinal and other soft tissues: No abnormality identified within included portions of the abdomen/retroperitoneum. Paraspinal soft tissues within normal limits  Disc levels:  Mild/moderate disc degeneration at L2-L3, L3-L4 and L4-L5. Mild disc degeneration at the remaining lumbar levels.  T11-T12: This level is imaged sagittally. Disc bulge. No significant spinal canal or foraminal stenosis.  T12-L1: Disc bulge. No significant spinal canal or foraminal stenosis.  L1-L2: Disc bulge. Mild facet arthrosis. No significant spinal canal or foraminal stenosis.  L2-L3: Trace retrolisthesis. Disc bulge. Mild facet arthrosis. No significant spinal canal or foraminal stenosis.  L3-L4: Trace retrolisthesis. Disc bulge with endplate spurring. Mild facet arthrosis/ligamentum flavum hypertrophy. No significant spinal canal stenosis. Mild left neural foraminal narrowing  L4-L5: Trace retrolisthesis. Disc bulge. Mild facet arthrosis/ligamentum flavum hypertrophy (greater on the left). Mild left subarticular narrowing without frank nerve root impingement. Central canal patent. Mild left neural foraminal narrowing.  L5-S1: Mild facet arthrosis. No significant disc herniation or stenosis.  IMPRESSION: Lumbar spondylosis as outlined with findings most notably as follows.  At L4-L5, there is trace retrolisthesis. Mild/moderate disc degeneration. Disc bulge. Mild facet arthrosis/ligamentum flavum hypertrophy. Mild left subarticular narrowing without frank nerve root impingement. Mild left neural foraminal narrowing.  At L3-L4, there is trace  retrolisthesis. Mild/moderate disc degeneration. Disc bulge with endplate spurring. Mild facet arthrosis/ligamentum flavum hypertrophy. Mild left neural foraminal narrowing. No significant canal stenosis.  At L2-L3, there is trace retrolisthesis. Mild/moderate disc degeneration. Disc bulge. Mild facet arthrosis. No significant canal or foraminal stenosis.   Electronically Signed   By: Kellie Simmering DO   On: 05/12/2020 08:32   PMFS History: Patient Active Problem List   Diagnosis Date Noted  . Elevated lipids 03/09/2020  . Encounter to establish care 02/22/2020  . Chronic abdominal pain 02/22/2020  . Hot flashes 02/22/2020  . Chronic back pain 02/22/2020  . Anxiety 02/22/2020   Past Medical History:  Diagnosis Date  . Uterine fibroid     Family History  Problem Relation Age of Onset  . Uterine cancer Mother 27  . Lung cancer Mother   . Brain cancer Mother   . Breast cancer Maternal Grandmother 60  . Breast cancer Paternal Grandmother 79    Past Surgical History:  Procedure Laterality Date  . ablation    . APPENDECTOMY    . DILATION AND CURETTAGE OF UTERUS     x3 for miscarriage  . LAPAROSCOPY     Fibroid removal   Social History   Occupational History  . Not on file  Tobacco Use  . Smoking status: Current Every Day Smoker    Packs/day: 0.25    Types: Cigarettes  .  Smokeless tobacco: Never Used  Vaping Use  . Vaping Use: Never used  Substance and Sexual Activity  . Alcohol use: Not Currently    Comment: Occ  . Drug use: Yes    Types: Marijuana  . Sexual activity: Yes    Partners: Male

## 2020-05-18 ENCOUNTER — Telehealth: Payer: Self-pay | Admitting: Licensed Clinical Social Worker

## 2020-05-18 NOTE — Telephone Encounter (Signed)
Called patient and administered the social determinants screening.  Will talk to social worker at clinic about referrals to possibly be made.

## 2020-05-24 ENCOUNTER — Other Ambulatory Visit: Payer: Self-pay

## 2020-05-24 ENCOUNTER — Ambulatory Visit: Payer: Self-pay | Admitting: Licensed Clinical Social Worker

## 2020-05-24 DIAGNOSIS — F1220 Cannabis dependence, uncomplicated: Secondary | ICD-10-CM

## 2020-05-24 DIAGNOSIS — F411 Generalized anxiety disorder: Secondary | ICD-10-CM

## 2020-05-24 DIAGNOSIS — F341 Dysthymic disorder: Secondary | ICD-10-CM

## 2020-05-24 NOTE — BH Specialist Note (Signed)
Integrated Behavioral Health Follow Up Visit  MRN: [id number] Name: Amber Livingston    Type of Service: [place] Interpretor:No. Interpretor Name and Language: none  SUBJECTIVE: Amber Livingston is a 59 y.o. female accompanied by herself  Patient was referred by Amber Shadow, NP for mental health. Patient reports the following symptoms/concerns: The patient reports that she is having a really good day. She states that her pain has been manageable today and she is spending some time reading. The patient notes that she plans on scheduling a mammogram and other appointments to explore her health concerns. The patient states she is very frustrated with pain management because the eight page application is confusing and is more like a rule book than an application. The patient states she is worried that because she smokes cannabis they will not accept her at the pain management clinic. She reports that she doesn't want to stop using cannabis because it is the "healing drug." She also expressed concerns that because she takes her medications as needed they may take her extra medication away from her when she goes to the pain management clinic. The patient notes that she has become determined to go back to work and is starting the job search process. She states that she is not sure that she can do a job but is going to find out. The patient denies suicidal and homicidal thoughts.  Duration of problem: ; Severity of problem: moderate  OBJECTIVE: Mood: Euthymic and Affect: Appropriate Risk of harm to self or others: No plan to harm self or others  LIFE CONTEXT: Family and Social: see above School/Work: see above Self-Care: see above Life Changes: see above  GOALS ADDRESSED: Patient will: 1.  Reduce symptoms of: anxiety, depression and stress  2.  Increase knowledge and/or ability of: coping skills and healthy habits  3.  Demonstrate ability to: Increase healthy  adjustment to current life circumstances  INTERVENTIONS: Interventions utilized:  Brief CBT was utilized by the clinician during today's follow-up session. The clinician processed with the patient how she has been since thee last follow session. The clinician measured the patients anxiety and depression on a numerical scale. The clinician congratulated the patient for taking the initiate to begin her job search. The clinician utilized reflective listening to provide a space for the client to ventilate her frustrations regarding her current life circumstances. The clinician discussed the importance of taking her medication as prescribed for it to reach it's full intended effect.   Standardized Assessments completed: GAD-7 and PHQ 9  GAD-7 15  PHQ-9 13  ASSESSMENT: Patient currently experiencing see above   Patient may benefit from see above  PLAN: 1. Follow up with behavioral health clinician on : 06/13/2020 at 5:30 pm  2. Behavioral recommendations: 3. Referral(s): [place] (In Clinic) 4. "From scale of 1-10, how likely are you to follow plan?":   Lesli Albee, Student-Social Work

## 2020-05-25 NOTE — Telephone Encounter (Signed)
Called patient to see if she wanted any referrals into the [facility] referral system as a result of her social determinants of health screening.  She shared that all was okay and that she did not.

## 2020-06-06 ENCOUNTER — Ambulatory Visit
Payer: Self-pay | Attending: Student in an Organized Health Care Education/Training Program | Admitting: Student in an Organized Health Care Education/Training Program

## 2020-06-06 ENCOUNTER — Other Ambulatory Visit: Payer: Self-pay

## 2020-06-06 ENCOUNTER — Encounter: Payer: Self-pay | Admitting: Student in an Organized Health Care Education/Training Program

## 2020-06-06 VITALS — BP 139/72 | HR 61 | Temp 97.1°F | Resp 18 | Ht 69.0 in | Wt 139.4 lb

## 2020-06-06 DIAGNOSIS — F1210 Cannabis abuse, uncomplicated: Secondary | ICD-10-CM | POA: Insufficient documentation

## 2020-06-06 DIAGNOSIS — M545 Low back pain, unspecified: Secondary | ICD-10-CM | POA: Insufficient documentation

## 2020-06-06 DIAGNOSIS — M47816 Spondylosis without myelopathy or radiculopathy, lumbar region: Secondary | ICD-10-CM | POA: Insufficient documentation

## 2020-06-06 DIAGNOSIS — M7918 Myalgia, other site: Secondary | ICD-10-CM | POA: Insufficient documentation

## 2020-06-06 DIAGNOSIS — M797 Fibromyalgia: Secondary | ICD-10-CM | POA: Insufficient documentation

## 2020-06-06 DIAGNOSIS — G8929 Other chronic pain: Secondary | ICD-10-CM | POA: Insufficient documentation

## 2020-06-06 DIAGNOSIS — G894 Chronic pain syndrome: Secondary | ICD-10-CM | POA: Insufficient documentation

## 2020-06-06 NOTE — Progress Notes (Signed)
Safety precautions to be maintained throughout the outpatient stay will include: orient to surroundings, keep bed in low position, maintain call bell within reach at all times, provide assistance with transfer out of bed and ambulation.  

## 2020-06-06 NOTE — Progress Notes (Signed)
Patient: Amber Livingston  Service Category: E/M  Provider: Gillis Santa, MD  DOB: Sep 19, 1960  DOS: 06/06/2020  Referring Provider: Langston Reusing, NP  MRN: [id number]  Setting: Ambulatory outpatient  PCP: Malachy Mood, MD  Type: New Patient  Specialty: Interventional Pain Management    Location: Office  Delivery: Face-to-face     Primary Reason(s) for Visit: Encounter for initial evaluation of one or more chronic problems (new to examiner) potentially causing chronic pain, and posing a threat to normal musculoskeletal function. (Level of risk: High) CC: Back Pain and Neck Pain  HPI  Amber Livingston is a 59 y.o. year old, female patient, who comes for the first time to our practice referred by Caryl Asp E, NP for our initial evaluation of her chronic pain. She has Encounter to establish care; Chronic abdominal pain; Hot flashes; Chronic back pain; Anxiety; Elevated lipids; Chronic bilateral low back pain without sciatica; Lumbar facet arthropathy; Myofascial pain; Chronic pain syndrome; Cannabis use disorder, mild, abuse; and Fibromyalgia on their problem list. Today she comes in for evaluation of her Back Pain and Neck Pain  Pain Assessment: Location: Lower Back Radiating: radiates into both hips and upper thighs Onset: More than a month ago Duration: Chronic pain Quality: Burning, Radiating Severity: 4 /10 (subjective, self-reported pain score)  Effect on ADL: , no strength in legs Timing: Constant Modifying factors: hot water and epsom salt, chi vitalizer machine, cannabis, BP: 139/72  HR: 61  Onset and Duration: Date of onset: 2006 Cause of pain: falls Severity: Getting worse, NAS-11 at its worse: 8/10, NAS-11 at its best: 5/10, NAS-11 now: 6/10 and NAS-11 on the average: 5/10 Timing: Morning and weather changes/full moon Aggravating Factors: Bending, Bowel movements, Kneeling, Lifiting, Motion, Prolonged sitting, Prolonged standing, Squatting, Twisting and Walking  downhill Alleviating Factors: Stretching, Hot packs, Lying down and Warm showers or baths Associated Problems: Night-time cramps, Fatigue, Inability to concentrate, Nausea, Sadness, Spasms, Temperature changes, Vomiting , Pain that wakes patient up and Pain that does not allow patient to sleep Quality of Pain: Agonizing, Burning, Constant, Disabling, Exhausting, Horrible, Pressure-like, Sharp, Shooting, Stabbing, Tiring and Uncomfortable Previous Examinations or Tests: Epidurogram Previous Treatments: Chiropractic manipulations, Narcotic medications and Steroid treatments by mouth   Historic Controlled Substance Pharmacotherapy Review  Historical Monitoring: The patient  reports current drug use. Drug: Marijuana. List of all UDS Test(s): No results found for: MDMA, COCAINSCRNUR, [place], [place], CANNABQUANT, [place], [place] List of other Serum/Urine Drug Screening Test(s):  No results found for: AMPHSCRSER, BARBSCRSER, BENZOSCRSER, COCAINSCRSER, COCAINSCRNUR, PCPSCRSER, PCPQUANT, THCSCRSER, THCU, CANNABQUANT, OPIATESCRSER, OXYSCRSER, PROPOXSCRSER, ETH Historical Background Evaluation: [place] PMP: PDMP not reviewed this encounter. Online review of the past 4-month period conducted.             [place] Department of public safety, offender search: Editor, commissioning Information) Non-contributory Risk Assessment Profile: Aberrant behavior: None observed or detected today Risk factors for fatal opioid overdose: history of substance abuse, history of substance use disorder and signs of non-medical use of Opioids Fatal overdose hazard ratio (HR): Calculation deferred Non-fatal overdose hazard ratio (HR): Calculation deferred Risk of opioid abuse or dependence: 0.7-3.0% with doses ? 36 MME/day and 6.1-26% with doses ? 120 MME/day. Substance use disorder (SUD) risk level: High Personal History of Substance Abuse (SUD-Substance use disorder):  Alcohol: Negative  Illegal Drugs: Positive Female or Female (cannabis for  pain)  Rx Drugs: Negative  ORT Risk Level calculation: Moderate Risk  Opioid Risk Tool - 06/06/20 1021      Family History  of Substance Abuse   Alcohol Negative    Illegal Drugs Negative    Rx Drugs Negative      Personal History of Substance Abuse   Alcohol Negative    Illegal Drugs Positive Female or Female   cannabis for pain   Rx Drugs Negative      Age   Age between 59-45 years  No      History of Preadolescent Sexual Abuse   History of Preadolescent Sexual Abuse Negative or Female      Psychological Disease   Psychological Disease Negative    Depression Negative      Total Score   Opioid Risk Tool Scoring 4    Opioid Risk Interpretation Moderate Risk          ORT Scoring interpretation table:  Score <3 = Low Risk for SUD  Score between 4-7 = Moderate Risk for SUD  Score >8 = High Risk for Opioid Abuse   PHQ-2 Depression Scale:  Total score: 0  PHQ-2 Scoring interpretation table: (Score and probability of major depressive disorder)  Score 0 = No depression  Score 1 = 15.4% Probability  Score 2 = 21.1% Probability  Score 3 = 38.4% Probability  Score 4 = 45.5% Probability  Score 5 = 56.4% Probability  Score 6 = 78.6% Probability   PHQ-9 Depression Scale:  Total score: 0  PHQ-9 Scoring interpretation table:  Score 0-4 = No depression  Score 5-9 = Mild depression  Score 10-14 = Moderate depression  Score 15-19 = Moderately severe depression  Score 20-27 = Severe depression (2.4 times higher risk of SUD and 2.89 times higher risk of overuse)   Pharmacologic Plan: Non-opioid analgesic therapy offered.            Initial impression: Poor candidate for opioid analgesics.  Meds   Current Outpatient Medications:  .  BEE POLLEN PO, Take 1 capsule by mouth daily., Disp: , Rfl:  .  Cyanocobalamin (B-12) 2500 MCG TABS, Take by mouth daily., Disp: , Rfl:  .  dicyclomine (BENTYL) 10 MG capsule, Take 10 mg by mouth 4 (four) times daily -  before meals and at bedtime.,  Disp: , Rfl:  .  estradiol (ESTRACE) 0.5 MG tablet, Take 0.5 mg by mouth daily., Disp: , Rfl:  .  medroxyPROGESTERone (PROVERA) 2.5 MG tablet, Take 2.5 mg by mouth daily., Disp: , Rfl:  .  NON FORMULARY, as needed. CBD gummies, Disp: , Rfl:  .  pantoprazole (PROTONIX) 40 MG tablet, Take 40 mg by mouth daily as needed., Disp: , Rfl:  .  Turmeric (QC TUMERIC COMPLEX PO), Take 1,000 mg by mouth daily., Disp: , Rfl:   Imaging Review   Lumbosacral Imaging: Lumbar MR wo contrast: Results for orders placed during the hospital encounter of 05/11/20  MR Lumbar Spine w/o contrast  Narrative CLINICAL DATA:  Spinal stenosis of lumbosacral region. Spinal stenosis, lumbosacral. Additional history provided by scanning technologist: Patient reports pain radiating to right and left leg since June, difficulty walking.  EXAM: MRI LUMBAR SPINE WITHOUT CONTRAST  TECHNIQUE: Multiplanar, multisequence MR imaging of the lumbar spine was performed. No intravenous contrast was administered.  COMPARISON:  Lumbar spine radiographs 03/03/2020.  FINDINGS: Segmentation: Transitional lumbosacral anatomy is identified. For the purposes of this dictation, the L5 vertebra is the transitional level. There is a well-formed intervertebral disc space with a left-sided (and possibly right-sided) assimilation joint at the L5-S1 level.  Alignment: Straightening of the expected lumbar lordosis. Trace L2-L3, L3-L4  and L4-L5 grade 1 retrolisthesis  Vertebrae: Vertebral body height is maintained. Trace degenerative endplate edema at F8-B0.  Conus medullaris and cauda equina: Conus extends to the L1 level. No signal abnormality within the visualized distal spinal cord.  Paraspinal and other soft tissues: No abnormality identified within included portions of the abdomen/retroperitoneum. Paraspinal soft tissues within normal limits  Disc levels:  Mild/moderate disc degeneration at L2-L3, L3-L4 and L4-L5. Mild  disc degeneration at the remaining lumbar levels.  T11-T12: This level is imaged sagittally. Disc bulge. No significant spinal canal or foraminal stenosis.  T12-L1: Disc bulge. No significant spinal canal or foraminal stenosis.  L1-L2: Disc bulge. Mild facet arthrosis. No significant spinal canal or foraminal stenosis.  L2-L3: Trace retrolisthesis. Disc bulge. Mild facet arthrosis. No significant spinal canal or foraminal stenosis.  L3-L4: Trace retrolisthesis. Disc bulge with endplate spurring. Mild facet arthrosis/ligamentum flavum hypertrophy. No significant spinal canal stenosis. Mild left neural foraminal narrowing  L4-L5: Trace retrolisthesis. Disc bulge. Mild facet arthrosis/ligamentum flavum hypertrophy (greater on the left). Mild left subarticular narrowing without frank nerve root impingement. Central canal patent. Mild left neural foraminal narrowing.  L5-S1: Mild facet arthrosis. No significant disc herniation or stenosis.  IMPRESSION: Lumbar spondylosis as outlined with findings most notably as follows.  At L4-L5, there is trace retrolisthesis. Mild/moderate disc degeneration. Disc bulge. Mild facet arthrosis/ligamentum flavum hypertrophy. Mild left subarticular narrowing without frank nerve root impingement. Mild left neural foraminal narrowing.  At L3-L4, there is trace retrolisthesis. Mild/moderate disc degeneration. Disc bulge with endplate spurring. Mild facet arthrosis/ligamentum flavum hypertrophy. Mild left neural foraminal narrowing. No significant canal stenosis.  At L2-L3, there is trace retrolisthesis. Mild/moderate disc degeneration. Disc bulge. Mild facet arthrosis. No significant canal or foraminal stenosis.   Electronically Signed By: Kellie Simmering DO On: 05/12/2020 08:32  Narrative CLINICAL DATA:  Low back pain worsening for 1 month  EXAM: LUMBAR SPINE - 2-3 VIEW  COMPARISON:  None.  FINDINGS: Normal alignment. No fracture. Early  disc space narrowing and anterior spurring in the mid and lower lumbar spine. Early degenerative facet disease in the lower lumbar spine. Aortic atherosclerosis. No aneurysm.  IMPRESSION: Degenerative changes as above.  No acute bony abnormality.   Electronically Signed By: Rolm Baptise M.D. On: 03/06/2020 10:24  Complexity Note: Imaging results reviewed. Results shared with Ms. Estell, using Layman's terms.                         ROS  Cardiovascular: No reported cardiovascular signs or symptoms such as High blood pressure, coronary artery disease, abnormal heart rate or rhythm, heart attack, blood thinner therapy or heart weakness and/or failure Pulmonary or Respiratory: Smoking Neurological: No reported neurological signs or symptoms such as seizures, abnormal skin sensations, urinary and/or fecal incontinence, being born with an abnormal open spine and/or a tethered spinal cord Psychological-Psychiatric: No reported psychological or psychiatric signs or symptoms such as difficulty sleeping, anxiety, depression, delusions or hallucinations (schizophrenial), mood swings (bipolar disorders) or suicidal ideations or attempts Gastrointestinal: No reported gastrointestinal signs or symptoms such as vomiting or evacuating blood, reflux, heartburn, alternating episodes of diarrhea and constipation, inflamed or scarred liver, or pancreas or irrregular and/or infrequent bowel movements Genitourinary: No reported renal or genitourinary signs or symptoms such as difficulty voiding or producing urine, peeing blood, non-functioning kidney, kidney stones, difficulty emptying the bladder, difficulty controlling the flow of urine, or chronic kidney disease Hematological: No reported hematological signs or symptoms such as prolonged  bleeding, low or poor functioning platelets, bruising or bleeding easily, hereditary bleeding problems, low energy levels due to low hemoglobin or being anemic Endocrine: No  reported endocrine signs or symptoms such as high or low blood sugar, rapid heart rate due to high thyroid levels, obesity or weight gain due to slow thyroid or thyroid disease Rheumatologic: Generalized muscle aches (Fibromyalgia) and Constant unexplained fatigue (Chronic Fatigue Syndrome) Musculoskeletal: Negative for myasthenia gravis, muscular dystrophy, multiple sclerosis or malignant hyperthermia Work History: Out of work due to pain  Allergies  Amber Livingston has No Known Allergies.  Laboratory Chemistry Profile   Renal Lab Results  Component Value Date   BUN 13 03/02/2020   CREATININE 0.74 03/02/2020   BCR 18 03/02/2020   GFRAA 103 03/02/2020   GFRNONAA 89 03/02/2020   SPECGRAV 1.014 03/02/2020   PHUR 5.0 03/02/2020   PROTEINUR Negative 03/02/2020     Electrolytes Lab Results  Component Value Date   NA 139 03/02/2020   K 4.1 03/02/2020   CL 106 03/02/2020   CALCIUM 9.5 03/02/2020     Hepatic Lab Results  Component Value Date   AST 14 03/02/2020   ALT 23 03/02/2020   ALBUMIN 4.7 03/02/2020   ALKPHOS 72 03/02/2020     ID No results found for: LYMEIGGIGMAB, HIV, [place], STAPHAUREUS, MRSAPCR, HCVAB, PREGTESTUR, RMSFIGG, QFVRPH1IGG, QFVRPH2IGG, LYMEIGGIGMAB   Bone No results found for: VD25OH, VD125OH2TOT, [id number], [id number], 25OHVITD1, 25OHVITD2, 25OHVITD3, TESTOFREE, TESTOSTERONE   Endocrine Lab Results  Component Value Date   GLUCOSE 80 03/02/2020   GLUCOSEU Negative 03/02/2020   TSH 0.863 03/02/2020     Neuropathy No results found for: VITAMINB12, FOLATE, HGBA1C, HIV   CNS No results found for: COLORCSF, APPEARCSF, RBCCOUNTCSF, WBCCSF, POLYSCSF, LYMPHSCSF, EOSCSF, PROTEINCSF, GLUCCSF, JCVIRUS, CSFOLI, IGGCSF, LABACHR, ACETBL, LABACHR, ACETBL   Inflammation (CRP: Acute  ESR: Chronic) No results found for: CRP, ESRSEDRATE, LATICACIDVEN   Rheumatology No results found for: RF, ANA, LABURIC, URICUR, LYMEIGGIGMAB, LYMEABIGMQN, HLAB27    Coagulation Lab Results  Component Value Date   PLT 279 03/02/2020     Cardiovascular Lab Results  Component Value Date   HGB 13.1 03/02/2020   HCT 36.7 03/02/2020     Screening No results found for: SARSCOV2NAA, COVIDSOURCE, STAPHAUREUS, MRSAPCR, HCVAB, HIV, PREGTESTUR   Cancer No results found for: CEA, CA125, LABCA2   Allergens No results found for: ALMOND, APPLE, ASPARAGUS, AVOCADO, BANANA, BARLEY, BASIL, [place], GREEN[place]AN, LIMABEAN, WHITEBEAN, BEEFIGE, REDBEET, BLUEBERRY, BROCCOLI, CABBAGE, MELON, CARROT, CASEIN, CASHEWNUT, CAULIFLOWER, CELERY     Note: Lab results reviewed.  PFSH  Drug: Amber Livingston  reports current drug use. Drug: Marijuana. Alcohol:  reports previous alcohol use. Tobacco:  reports that she has been smoking cigarettes. She has been smoking about 0.25 packs per day. She has never used smokeless tobacco. Medical:  has a past medical history of Uterine fibroid. Family: family history includes Brain cancer in her mother; Breast cancer (age of onset: 53) in her maternal grandmother and paternal grandmother; Lung cancer in her mother; Uterine cancer (age of onset: 68) in her mother.  Past Surgical History:  Procedure Laterality Date  . ablation    . APPENDECTOMY    . DILATION AND CURETTAGE OF UTERUS     x3 for miscarriage  . LAPAROSCOPY     Fibroid removal   Active Ambulatory Problems    Diagnosis Date Noted  . Encounter to establish care 02/22/2020  . Chronic abdominal pain 02/22/2020  . Hot flashes 02/22/2020  . Chronic back  pain 02/22/2020  . Anxiety 02/22/2020  . Elevated lipids 03/09/2020  . Chronic bilateral low back pain without sciatica 06/06/2020  . Lumbar facet arthropathy 06/06/2020  . Myofascial pain 06/06/2020  . Chronic pain syndrome 06/06/2020  . Cannabis use disorder, mild, abuse 06/06/2020  . Fibromyalgia 06/06/2020   Resolved Ambulatory Problems    Diagnosis Date Noted  . No Resolved Ambulatory Problems   Past Medical  History:  Diagnosis Date  . Uterine fibroid    Constitutional Exam  General appearance: alert, anxious, nervous and oriented Vitals:   06/06/20 1002  BP: 139/72  Pulse: 61  Resp: 18  Temp: (!) 97.1 F (36.2 C)  SpO2: 100%  Weight: 139 lb 6.4 oz (63.2 kg)  Height: $Remove'5\' 9"'XEMxuJg$  (1.753 m)   BMI Assessment: Estimated body mass index is 20.59 kg/m as calculated from the following:   Height as of this encounter: $RemoveBeforeD'5\' 9"'wxaqqEoNkmRBiV$  (1.753 m).   Weight as of this encounter: 139 lb 6.4 oz (63.2 kg).  BMI interpretation table: BMI level Category Range association with higher incidence of chronic pain  <18 kg/m2 Underweight   18.5-24.9 kg/m2 Ideal body weight   25-29.9 kg/m2 Overweight Increased incidence by 20%  30-34.9 kg/m2 Obese (Class I) Increased incidence by 68%  35-39.9 kg/m2 Severe obesity (Class II) Increased incidence by 136%  >40 kg/m2 Extreme obesity (Class III) Increased incidence by 254%   Patient's current BMI Ideal Body weight  Body mass index is 20.59 kg/m. Ideal body weight: 66.2 kg (145 lb 15.1 oz)   BMI Readings from Last 4 Encounters:  06/06/20 20.59 kg/m  04/25/20 22.40 kg/m  04/18/20 22.37 kg/m  04/06/20 22.65 kg/m   Wt Readings from Last 4 Encounters:  06/06/20 139 lb 6.4 oz (63.2 kg)  04/25/20 143 lb (64.9 kg)  04/18/20 142 lb 12.8 oz (64.8 kg)  04/06/20 144 lb 9.6 oz (65.6 kg)    Psych/Mental status: Alert, oriented x 3 (person, place, & time)       Eyes: PERLA Respiratory: No evidence of acute respiratory distress  Cervical Spine Exam  Skin & Axial Inspection: No masses, redness, edema, swelling, or associated skin lesions Alignment: Symmetrical Functional ROM: Pain restricted ROM      Stability: No instability detected Muscle Tone/Strength: Functionally intact. No obvious neuro-muscular anomalies detected. Sensory (Neurological): Musculoskeletal pain pattern  Upper Extremity (UE) Exam    Side: Right upper extremity  Side: Left upper extremity  Skin &  Extremity Inspection: Skin color, temperature, and hair growth are WNL. No peripheral edema or cyanosis. No masses, redness, swelling, asymmetry, or associated skin lesions. No contractures.  Skin & Extremity Inspection: Skin color, temperature, and hair growth are WNL. No peripheral edema or cyanosis. No masses, redness, swelling, asymmetry, or associated skin lesions. No contractures.  Functional ROM: Pain restricted ROM for all joints of upper extremity  Functional ROM: Pain restricted ROM for shoulder and elbow  Muscle Tone/Strength: Functionally intact. No obvious neuro-muscular anomalies detected.   Muscle Tone/Strength: Functionally intact. No obvious neuro-muscular anomalies detected.      Palpation: No palpable anomalies              Palpation: No palpable anomalies              Provocative Test(s):  Phalen's test: deferred Tinel's test: deferred Apley's scratch test (touch opposite shoulder):  Action 1 (Across chest): deferred Action 2 (Overhead): deferred Action 3 (LB reach): deferred   Provocative Test(s):  Phalen's test: deferred Tinel's test: deferred Apley's  scratch test (touch opposite shoulder):  Action 1 (Across chest): deferred Action 2 (Overhead): deferred Action 3 (LB reach): deferred    Thoracic Spine Area Exam  Skin & Axial Inspection: No masses, redness, or swelling Alignment: Symmetrical Functional ROM: Unrestricted ROM Stability: No instability detected Muscle Tone/Strength: Functionally intact. No obvious neuro-muscular anomalies detected. Sensory (Neurological): Unimpaired Muscle strength & Tone: No palpable anomalies  Lumbar Exam  Skin & Axial Inspection: No masses, redness, or swelling Alignment: Symmetrical Functional ROM: Pain restricted ROM       Stability: No instability detected Muscle Tone/Strength: Functionally intact. No obvious neuro-muscular anomalies detected. Sensory (Neurological): Musculoskeletal pain pattern   Gait & Posture  Assessment  Ambulation: Limited Gait: Relatively normal for age and body habitus Posture: Difficulty standing up straight, due to pain   Lower Extremity Exam    Side: Right lower extremity  Side: Left lower extremity  Stability: No instability observed          Stability: No instability observed          Skin & Extremity Inspection: Skin color, temperature, and hair growth are WNL. No peripheral edema or cyanosis. No masses, redness, swelling, asymmetry, or associated skin lesions. No contractures.  Skin & Extremity Inspection: Skin color, temperature, and hair growth are WNL. No peripheral edema or cyanosis. No masses, redness, swelling, asymmetry, or associated skin lesions. No contractures.  Functional ROM: Pain restricted ROM                  Functional ROM: Pain restricted ROM                  Muscle Tone/Strength: Functionally intact. No obvious neuro-muscular anomalies detected.  Muscle Tone/Strength: Functionally intact. No obvious neuro-muscular anomalies detected.  Sensory (Neurological): Unimpaired        Sensory (Neurological): Unimpaired        DTR: Patellar: deferred today Achilles: deferred today Plantar: deferred today  DTR: Patellar: deferred today Achilles: deferred today Plantar: deferred today  Palpation: No palpable anomalies  Palpation: No palpable anomalies   Assessment  Primary Diagnosis & Pertinent Problem List: The primary encounter diagnosis was Chronic bilateral low back pain without sciatica. Diagnoses of Fibromyalgia, Lumbar facet arthropathy, Myofascial pain, Chronic pain syndrome, and Cannabis use disorder, mild, abuse were also pertinent to this visit.  Visit Diagnosis (New problems to examiner): 1. Chronic bilateral low back pain without sciatica   2. Fibromyalgia   3. Lumbar facet arthropathy   4. Myofascial pain   5. Chronic pain syndrome   6. Cannabis use disorder, mild, abuse     Amber Livingston is a 59 year old female who presents with a chief  complaint of neck pain as well as low back pain that radiates into her bilateral posterior thighs.  Of note patient has a history of fibromyalgia.  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  She is not insured.  During the encounter, patient was somewhat tangential in her thought pattern and needed redirecting pertaining to her pain.  She states that she used to work in [employer] and has been on Product/process development scientist in the past and initially fell on a yacht that caused her to have severe right shoulder pain.  She subsequently injured herself further a year later and had low back pain.  She does utilize cannabis frequently.  She states that she has reduced red meat  in her diet which has helped out with her whole body inflammation and pain.  She states that she would like to work.  She did become tearful during the encounter.  We discussed evidence-based therapies for fibromyalgia.  I recommend against chronic opioid therapy for her condition as there is not evidence to suggest that it is helpful for fibromyalgia especially in the context of depression, anxiety and chronic/frequent THC use.  Significant psychological overlay and recommend that she continue care with social worker and try to find a therapist last psychiatrist to engage in CBT therapy and pain coping skills.  I did review the patient's lumbar MRI which is largely benign.  Shows mild degenerative changes in her lower lumbar spine which are to be expected based upon her previous occupation.  Do not recommend any injections at this time.  Recommend physical therapy and continued care with primary care provider.  Plan of Care    Referral Orders     Ambulatory referral to Physical Therapy   Pharmacological management options:  Opioid Analgesics:Not a candidate, +THC use, do not recommend for fibromyalgia/ MSK related pain   Membrane stabilizer: Patient not interested in gabapentin, Lyrica  Muscle relaxant: Tried and failed  NSAID: Consider over-the-counter ibuprofen, Aleve  Other analgesic(s): topical OTC Lidocaine patch    Provider-requested follow-up: Return if symptoms worsen or fail to improve.  Future Appointments  Date Time Provider [place]  06/13/2020  5:30 PM Lesli Albee, Student-Social Work [facility] None  07/05/2020 10:00 AM [place], Chioma E, NP [facility] None    Note by: Gillis Santa, MD Date: 06/06/2020; Time: 11:28 AM

## 2020-06-06 NOTE — Patient Instructions (Signed)
You have been given a referral to Physical Therapy

## 2020-06-13 ENCOUNTER — Other Ambulatory Visit: Payer: Self-pay

## 2020-06-13 ENCOUNTER — Ambulatory Visit: Payer: Self-pay | Admitting: Licensed Clinical Social Worker

## 2020-06-13 DIAGNOSIS — F1220 Cannabis dependence, uncomplicated: Secondary | ICD-10-CM

## 2020-06-13 DIAGNOSIS — F451 Undifferentiated somatoform disorder: Secondary | ICD-10-CM

## 2020-06-13 NOTE — BH Specialist Note (Signed)
Integrated Behavioral Health Follow Up Visit  MRN: [id number] Name: Amber Livingston    Type of Service: [place] Interpretor:No.  Interpretor Name and Language:none   SUBJECTIVE: Amber Livingston is a 59 y.o. female accompanied by herself Patient was referred by [name], Np for mental health. Patient reports the following symptoms/concerns: The patient reports that she was able to make it to her appointment at the pain management clinic and that she was referred for physical therapy. She states that she will be doing water physical therapy in a pool to loosen up her back, and reduce her pain levels. She stated that the pain management clinic told her she should not be having this much pain from stage one arthritis. The patient discussed further health and financial stressors. The patient stated that she is juicing, taking her oils, and suppplements and has lost 24 pounds. The patient reports that due to the full moon and the weather she was unable to get out of bed Friday through Sunday. The patient denies suicidal or homincidal thoughts.  Duration of problem: ; Severity of problem: moderate  OBJECTIVE: Mood: Euthymic and Affect: Appropriate Risk of harm to self or others: No plan to harm self or others  LIFE CONTEXT: Family and Social: see above School/Work:see above Self-Care: see above  Life Changes: see above  GOALS ADDRESSED: Patient will: 1.  Reduce symptoms of: anxiety, depression and stress  2.  Increase knowledge and/or ability of: coping skills, healthy habits and stress reduction  3.  Demonstrate ability to: Increase healthy adjustment to current life circumstances  INTERVENTIONS: Interventions utilized:  Supportive Counseling was utilized by the clinician during today's follow up session. Clinician processed with patient how she has been doing since the last follow up session. Clinician utilized reflective listening encouraging the  patient to ventilate her feelings regarding her current life circumstances Clinician encouraged patient to continue to use  her coping skills and practice self care. Further, the clinician encouraged the patient to adhere to the care plan by taking her medication as prescribed. Clinician measured the patients anxiety and depression on a numerical scale.  Standardized Assessments completed: GAD-7 and PHQ 9  PHQ-9  17 GAD-7   14  ASSESSMENT: Patient currently experiencing see above  Patient may benefit from see above  PLAN: 1. Follow up with behavioral health clinician on : 07/04/2020 at 3:00 PM  2. Behavioral recommendations: . 3. Referral(s): [place] (In Clinic) 4. "From scale of 1-10, how likely are you to follow plan?" .  Lesli Albee, Student-Social Work

## 2020-07-04 ENCOUNTER — Encounter: Payer: Self-pay | Admitting: Physical Therapy

## 2020-07-04 ENCOUNTER — Ambulatory Visit: Payer: Self-pay | Admitting: Licensed Clinical Social Worker

## 2020-07-04 ENCOUNTER — Other Ambulatory Visit: Payer: Self-pay

## 2020-07-04 ENCOUNTER — Ambulatory Visit
Payer: No Typology Code available for payment source | Attending: Student in an Organized Health Care Education/Training Program | Admitting: Physical Therapy

## 2020-07-04 DIAGNOSIS — R2689 Other abnormalities of gait and mobility: Secondary | ICD-10-CM | POA: Insufficient documentation

## 2020-07-04 DIAGNOSIS — F451 Undifferentiated somatoform disorder: Secondary | ICD-10-CM

## 2020-07-04 DIAGNOSIS — G8929 Other chronic pain: Secondary | ICD-10-CM | POA: Insufficient documentation

## 2020-07-04 DIAGNOSIS — M545 Low back pain, unspecified: Secondary | ICD-10-CM | POA: Insufficient documentation

## 2020-07-04 DIAGNOSIS — R531 Weakness: Secondary | ICD-10-CM

## 2020-07-04 DIAGNOSIS — F1220 Cannabis dependence, uncomplicated: Secondary | ICD-10-CM

## 2020-07-04 NOTE — BH Specialist Note (Signed)
Integrated Behavioral Health Follow Up Visit  MRN: [id number] Name: Amber Livingston   Type of Service: [place] Interpretor:No. Interpretor Name and Language:   SUBJECTIVE: Amber Livingston is a 59 y.o. female accompanied by herself Patient was referred by [name], NP for mental health  Patient reports the following symptoms/concerns: The patient reports that she had a couple bad days on Friday and Saturday due to the weather. She stated that" it got dark for a while there." She noted that looked like staying in bed and being depressed. She reported that did not last long because she had her first Physical therapy session and felt very hopeful that she was on the right road and that her therapist were "fantastic healers." She stated that she now feels better and is looking forward to doing 10 session twice a week. She stated that she is eating one big meal a day and trying to stick to her diet regime of juicing. She stated that she is sleeping much better by taking two Tylenol PM and a 25 MG of Trazodone. She noted she had an MRI  and is now wanting her heart checked out next because she is often short of breath without much exertion. The patient denied any suicidal or homicidal thoughts.  Duration of problem: ; Severity of problem: moderate  OBJECTIVE: Mood: Euthymic and Affect: Appropriate Risk of harm to self or others: No plan to harm self or others  LIFE CONTEXT: Family and Social: see above School/Work: see above Self-Care: see above Life Changes: see above  GOALS ADDRESSED: Patient will: 1.  Reduce symptoms of: anxiety, depression and stress  2.  Increase knowledge and/or ability of: coping skills, healthy habits and self-management skills  3.  Demonstrate ability to: Increase healthy adjustment to current life circumstances  INTERVENTIONS: Interventions utilized:  Supportive Counseling was utilized by the clinician during today's follow up  session. Clinician processed with patient how she has been doing since the last follow up session. Clinician utilized reflective listening encouraging the patient to ventilate her feelings regarding her current life circumstances. Clinician encouraged patient to continue to use her coping skills and practice self care. Further, the clinician encouraged the patient to adhere to the care plan by sticking to a 45 minute session and working through difficult topics and taking her medication as prescribed to reach its full intended effect. Clinician measured the patients anxiety and depression on a numerical scale.   Standardized Assessments completed: GAD-7 and PHQ 9  GAD-7 PHQ-9  ASSESSMENT: Patient currently experiencing see above  Patient may benefit from see above  PLAN: 1. Follow up with behavioral health clinician on : 07/26/2020 at 2:00 PM  2. Behavioral recommendations:  3. Referral(s): [place] (In Clinic) 4. "From scale of 1-10, how likely are you to follow plan?":   Lesli Albee, Student-Social Work

## 2020-07-04 NOTE — Therapy (Signed)
[facility] [address] [place], [place], [address] Phone: [phone]   Fax:  [phone]  Physical Therapy Evaluation  Patient Details  Name: Amber Livingston MRN: [id number] Date of Birth: [birth year] Referring Provider (PT): Dr. Gillis Santa   Encounter Date: 07/04/2020   PT End of Session - 07/04/20 1116    Visit Number 1    Number of Visits 9    Date for PT Re-Evaluation 08/01/20    Authorization - Visit Number 1    Authorization - Number of Visits 10    PT Start Time 3810    PT Stop Time 1116    PT Time Calculation (min) 58 min    Activity Tolerance Patient tolerated treatment well    Behavior During Therapy [facility] for tasks assessed/performed           Past Medical History:  Diagnosis Date  . Uterine fibroid     Past Surgical History:  Procedure Laterality Date  . ablation    . APPENDECTOMY    . DILATION AND CURETTAGE OF UTERUS     x3 for miscarriage  . LAPAROSCOPY     Fibroid removal    There were no vitals filed for this visit.    Subjective Assessment - 07/04/20 1022    Subjective Pt. is a 59 y.o. woman with low back pain and "late stage fibromyalgia." Pt.additionally states that she has cervical and lumbar stenosis. Pt. states that approx 10 years ago she had success with chiro and massage therapy, but patient moved a year ago and lost access to those. Pt. states that she woke up on June 10th with significant back pain and was "unable to walk, felt like someone put steel rods in her back." Pt. states that she did a total diet change and added vitamins/supplements that helped her inflammation. Pt. states that she has struggled to complete overhead motions with her low back pain. She additionally is incredibly sensitive to weather changes, in the winter her back gets far worse with pain. Pt. states she struggles with spinal extension, but is able to complete full spinal flexion. Pt. states her pain is not severe today,  "it's not a bad pain day." Pt. states she has 10-11 days/month where she has intense pain where she cannot get out of bed. Pt. states that pain has been improving with her lifestyle changes. Position changes do not change pain. Pt. uses something called a "Chi revitalizer" that hurts very bad during use, but feels better a day or so after. No bowel/bladder issues, no throbbing pain in belly. Pain does not radiate past knees. Pt. states that the pain is an achey pain. Pt. states that at best she can get to a 1/10 pain, 8/10 pain is typically the worst it gets. Pt. states that typically more movement helps with pain.    Pertinent History fibromyalgia    Patient Stated Goals return to work, improve strength and mobility, decrease pain    Currently in Pain? Yes    Pain Score 1     Pain Location Back    Pain Orientation Lower    Pain Descriptors / Indicators Aching    Pain Type Chronic pain           OBJECTIVE  Mental Status Patient is oriented to person, place and time.  Recent memory is intact.  Remote memory is intact.  Attention span and concentration are intact.  Expressive speech is intact.  Patient's fund of knowledge  is within normal limits for educational level.  SENSATION: Grossly intact to light touch bilateral LEs as determined by testing dermatomes L2-S2   MUSCULOSKELETAL: Tremor: None Bulk: Normal Tone: Normal No visible step-off along spinal column  Posture Lumbar lordosis: WNL Iliac crest height: equal bilaterally  Gait Pt demonstrates normal gait pattern with no gross abnormalities   Palpation Pt tender to palpation on L4/L5 paraspinals bilat.  Pain with counternutation and nutation mobs at SIJ   Strength (out of 5) R/L 4/4 Hip flexion 4/4 Hip ER 4/4 Hip IR 5/5 Hip abduction (seated) 5/5 Hip adduction (seated) 5/5 Knee extension 5/5 Knee flexion 5/5 Ankle dorsiflexion 5/5 Ankle plantarflexion  *Indicates pain   AROM (degrees) Limited  ext* Spinal flex WFL BLE AROM WFL *Indicates pain   Muscle Length Hamstrings: WFL bilat > 90 deg   Passive Accessory Intervertebral Motion (PAIVM) Pt generally hypomobilie T8-L5, repeated PAIVMs do not improve sx   SPECIAL TESTS 5xSTS: 19.4s Long axis hip distraction: improves sx bilaterally   Lumbar Radiculopathy and Discogenic: SLR: R: negative L: negative  Hip: FABER (SN 81): R: negative L: negative  SIJ:  Thigh Thrust: improves sx bilaterally  Compression: feels good and gives some relief  Distraction: relieves sx   Piriformis Syndrome: Pain with palpation, pain with prone IR/ER passively on L.    [facility] PT Assessment - 07/04/20 0001      Assessment   Medical Diagnosis Chronic bilateral low back pain/ Lumbar facet arthropathy/ Myofascial pain/ Chronic pain syndrome.    Referring Provider (PT) Dr. Gillis Santa    Onset Date/Surgical Date 08/20/19      Prior Function   Level of Independence Independent             Objective measurements completed on examination: See above findings.     TREATMENT:  Demonstrating HEP (1-2 reps completed bilat)   Supine Figure 4 Piriformis Stretch Prone Quadriceps Stretch with Strap Supine Lower Trunk Rotation  Tennis ball low back mobilization on wall for trigger point release         PT Education - 07/04/20 1119    Education Details HEP    Person(s) Educated Patient    Methods Explanation;Demonstration;Handout;Tactile cues;Verbal cues    Comprehension Verbalized understanding               PT Long Term Goals - 07/04/20 1209      PT LONG TERM GOAL #1   Title Pt. will perform 5xSTS in 12 seconds or less to demonstrate improvements in LE strength and functional mobility.    Baseline 11/16: 19.4s    Time 4    Period Weeks    Status New    Target Date 08/01/20      PT LONG TERM GOAL #2   Title Pt. will report ability to vaccuum for greater than 10 mins with no increase in back pain over 2/10 to improve  pain free functional mobility.    Baseline 11/16: pt is currently not able to vaccuum for 4-5 minutes with back pain at 5/10    Time 4    Period Weeks    Status New    Target Date 08/01/20      PT LONG TERM GOAL #3   Title Pt. will improve FOTO to 50 to demonstrate improvement in pain free functional mobility.    Baseline 11/16: 35    Time 4    Period Weeks    Status New    Target Date 08/01/20  PT LONG TERM GOAL #4   Title Pt. independent with HEP to increase B hip strength 1/2 muscle grade to improve standing tolerance/ pain-free household tasks.    Baseline Pt. demonstrates 5/5 strength bilaterally, except for bilat hip IR, ER, and flexion which scored 4/5 for bilat for each.    Time 4    Period Weeks    Status New    Target Date 08/01/20                  Plan - 07/04/20 1237    Clinical Impression Statement Pt. is a 59 y.o. woman with chronic low back pain. Pt. demonstrates 5/5 strength bilaterally, except for bilat hip IR, ER, and flexion which scored 4/5 for bilat for each. Pt. demonstrates WFL AROM for bilat LEs and spinal flexion, demonstrates decreased spinal ext. (see flowsheet). Pt. demonsrates good hamstring length bilaterally. Pt. negative for FABER and SLR bilat, and had no pain with increased hip flexion. Pt. experienced sx relief with LE long axist distraction bilaterally, thigh thrust bilat, SI compression, and SI distraction. Pt. experienced pain with counternutation and nutation mobilizations to SIJ. Pt. is generally hypomobile T8-L5, repeated PAVIMs did not improve sx. Pt. will benefit from skilled PT to improve strength and ROM to improve pain free functional mobility.    Personal Factors and Comorbidities Fitness;Past/Current Experience;Time since onset of injury/illness/exacerbation    Examination-Activity Limitations Bed Mobility;Lift;Stairs;Squat;Stand;Transfers;Sit    Examination-Participation Restrictions Community Activity;Yard Work     Merchant navy officer Stable/Uncomplicated    Designer, jewellery Low    Rehab Potential Fair    PT Frequency 2x / week    PT Duration 4 weeks    PT Treatment/Interventions ADLs/Self Care Home Management;Aquatic Therapy;Cryotherapy;Gait training;Stair training;Moist Heat;Functional mobility training;Therapeutic activities;Therapeutic exercise;Balance training;Neuromuscular re-education;Patient/family education;Manual techniques;Dry needling;Electrical Stimulation;Spinal Manipulations;Joint Manipulations    PT Next Visit Plan Manual tx./ reassess HEP    PT Home Exercise Plan [email or web address]    Consulted and Agree with Plan of Care Patient           Patient will benefit from skilled therapeutic intervention in order to improve the following deficits and impairments:  Abnormal gait, Decreased activity tolerance, Decreased endurance, Decreased mobility, Decreased range of motion, Hypomobility, Difficulty walking, Decreased strength, Increased muscle spasms, Impaired perceived functional ability, Pain  Visit Diagnosis: Chronic bilateral low back pain, unspecified whether sciatica present  Decreased mobility  Decreased strength     Problem List Patient Active Problem List   Diagnosis Date Noted  . Chronic bilateral low back pain without sciatica 06/06/2020  . Lumbar facet arthropathy 06/06/2020  . Myofascial pain 06/06/2020  . Chronic pain syndrome 06/06/2020  . Cannabis use disorder, mild, abuse 06/06/2020  . Fibromyalgia 06/06/2020  . Elevated lipids 03/09/2020  . Encounter to establish care 02/22/2020  . Chronic abdominal pain 02/22/2020  . Hot flashes 02/22/2020  . Chronic back pain 02/22/2020  . Anxiety 02/22/2020   Pura Spice, PT, DPT # [id number], SPT 07/05/2020, 7:52 AM  [facility] [address] [place], [place], [address] Phone: [phone]   Fax:  [phone]  Name: Amber Livingston MRN:  [id number] Date of Birth: 1961/06/22

## 2020-07-04 NOTE — Patient Instructions (Signed)
Access Code: [email or web address] URL: [email or web address] Date: 07/04/2020 Prepared by: Dorcas Carrow  Exercises Supine Figure 4 Piriformis Stretch - 1 x daily - 7 x weekly - 3 sets - 10 reps Prone Quadriceps Stretch with Strap - 1 x daily - 7 x weekly - 3 sets - 10 reps Supine Lower Trunk Rotation - 1 x daily - 7 x weekly - 3 sets - 10 reps

## 2020-07-05 ENCOUNTER — Other Ambulatory Visit: Payer: Self-pay | Admitting: Gerontology

## 2020-07-05 ENCOUNTER — Ambulatory Visit: Payer: Self-pay | Admitting: Gerontology

## 2020-07-05 VITALS — BP 104/67 | HR 99 | Temp 97.1°F | Resp 16 | Wt 136.5 lb

## 2020-07-05 DIAGNOSIS — Z87898 Personal history of other specified conditions: Secondary | ICD-10-CM

## 2020-07-05 DIAGNOSIS — E785 Hyperlipidemia, unspecified: Secondary | ICD-10-CM

## 2020-07-05 DIAGNOSIS — R0602 Shortness of breath: Secondary | ICD-10-CM

## 2020-07-05 MED ORDER — ALBUTEROL SULFATE HFA 108 (90 BASE) MCG/ACT IN AERS: 2.0000 | INHALATION_SPRAY | Freq: Four times a day (QID) | RESPIRATORY_TRACT | 2 refills | Status: DC | PRN

## 2020-07-05 NOTE — Progress Notes (Signed)
Established Patient Office Visit  Subjective:  Patient ID: Amber Livingston, female    DOB: 03-Aug-1961  Age: 59 y.o. MRN: [id number]  CC: No chief complaint on file.   HPI Jayleigh Cutrona presents for follow up. She states that she's compliant with her medication and continues to make healthy lifestyle changes. She follows up with pain management and has started Physical therapy. She had one session and she reports that she could notice some improvement. Currently, she c/o worsening shortness of breath that has being going on for many years when she performs chores such as vacuuming her apartment. She continues to smoke less than 10 cigarettes daily and admits the desire to quit.  She reports that after she recieved her 2nd dose of Covid vaccine , she's being experiencing heart palpitation and spasms 3-4 nights a week that resolves in less than 5 minutes. She denies nausea, vomiting and dizziness with symptoms. Overall, she states that she's concerned about her heart, and she offers no further complaint.  Past Medical History:  Diagnosis Date   Uterine fibroid     Past Surgical History:  Procedure Laterality Date   ablation     APPENDECTOMY     DILATION AND CURETTAGE OF UTERUS     x3 for miscarriage   LAPAROSCOPY     Fibroid removal    Family History  Problem Relation Age of Onset   Uterine cancer Mother 22   Lung cancer Mother    Brain cancer Mother    Breast cancer Maternal Grandmother 28   Breast cancer Paternal Grandmother 53    Social History   Socioeconomic History   Marital status: Married    Spouse name: Not on file   Number of children: Not on file   Years of education: Not on file   Highest education level: Not on file  Occupational History   Not on file  Tobacco Use   Smoking status: Current Every Day Smoker    Packs/day: 0.25    Types: Cigarettes   Smokeless tobacco: Never Used  Vaping Use   Vaping Use: Never used  Substance and Sexual Activity    Alcohol use: Not Currently    Comment: Occ   Drug use: Yes    Types: Marijuana   Sexual activity: Yes    Partners: Male  Other Topics Concern   Not on file  Social History Narrative   Not on file   Social Determinants of Health   Financial Resource Strain: High Risk   Difficulty of Paying Living Expenses: Very hard  Food Insecurity: Food Insecurity Present   Worried About Running Out of Food in the Last Year: Sometimes true   Ran Out of Food in the Last Year: Never true  Transportation Needs: No Transportation Needs   Lack of Transportation (Medical): No   Lack of Transportation (Non-Medical): No  Physical Activity: Inactive   Days of Exercise per Week: 0 days   Minutes of Exercise per Session: 0 min  Stress: Stress Concern Present   Feeling of Stress : To some extent  Social Connections: Socially Isolated   Frequency of Communication with Friends and Family: Twice a week   Frequency of Social Gatherings with Friends and Family: Never   Attends Religious Services: Never   Marine scientist or Organizations: No   Attends Archivist Meetings: Never   Marital Status: Never married  Human resources officer Violence:    Fear of Current or Ex-Partner: Not on file  Emotionally Abused: Not on file   Physically Abused: Not on file   Sexually Abused: Not on file    Outpatient Medications Prior to Visit  Medication Sig Dispense Refill   BEE POLLEN PO Take 1 capsule by mouth daily.     Cyanocobalamin (B-12) 2500 MCG TABS Take by mouth daily.     estradiol (ESTRACE) 0.5 MG tablet Take 0.5 mg by mouth daily.     medroxyPROGESTERone (PROVERA) 2.5 MG tablet Take 2.5 mg by mouth daily.     Turmeric (QC TUMERIC COMPLEX PO) Take 1,000 mg by mouth daily.     NON FORMULARY as needed. CBD gummies     dicyclomine (BENTYL) 10 MG capsule Take 10 mg by mouth 4 (four) times daily -  before meals and at bedtime. (Patient not taking: Reported on  07/05/2020)     pantoprazole (PROTONIX) 40 MG tablet Take 40 mg by mouth daily as needed. (Patient not taking: Reported on 07/05/2020)     No facility-administered medications prior to visit.    No Known Allergies  ROS Review of Systems  Constitutional: Positive for fatigue.  Eyes: Negative.   Respiratory: Negative.   Cardiovascular: Positive for palpitations (intermittent palpitation).  Neurological: Negative.   Psychiatric/Behavioral: Negative.       Objective:    Physical Exam Constitutional:      Appearance: Normal appearance.  HENT:     Head: Normocephalic and atraumatic.  Eyes:     Extraocular Movements: Extraocular movements intact.     Pupils: Pupils are equal, round, and reactive to light.  Cardiovascular:     Rate and Rhythm: Normal rate and regular rhythm.     Pulses: Normal pulses.     Heart sounds: Normal heart sounds.  Pulmonary:     Effort: Pulmonary effort is normal.     Breath sounds: Normal breath sounds.  Neurological:     General: No focal deficit present.     Mental Status: She is alert and oriented to person, place, and time. Mental status is at baseline.  Psychiatric:        Mood and Affect: Mood normal.        Behavior: Behavior normal.        Thought Content: Thought content normal.        Judgment: Judgment normal.     BP 104/67 (BP Location: Left Arm, Patient Position: Sitting, Cuff Size: Normal)    Pulse 99    Temp (!) 97.1 F (36.2 C)    Resp 16    Wt 136 lb 8 oz (61.9 kg)    SpO2 97%    BMI 20.16 kg/m  Wt Readings from Last 3 Encounters:  07/05/20 136 lb 8 oz (61.9 kg)  06/06/20 139 lb 6.4 oz (63.2 kg)  04/25/20 143 lb (64.9 kg)     Health Maintenance Due  Topic Date Due   Hepatitis C Screening  Never done   HIV Screening  Never done   TETANUS/TDAP  Never done   MAMMOGRAM  Never done   INFLUENZA VACCINE  Never done    There are no preventive care reminders to display for this patient.  Lab Results  Component  Value Date   TSH 0.863 03/02/2020   Lab Results  Component Value Date   WBC 5.5 03/02/2020   HGB 13.1 03/02/2020   HCT 36.7 03/02/2020   MCV 91 03/02/2020   PLT 279 03/02/2020   Lab Results  Component Value Date   NA 139 03/02/2020  K 4.1 03/02/2020   CO2 21 03/02/2020   GLUCOSE 80 03/02/2020   BUN 13 03/02/2020   CREATININE 0.74 03/02/2020   BILITOT 0.5 03/02/2020   ALKPHOS 72 03/02/2020   AST 14 03/02/2020   ALT 23 03/02/2020   PROT 7.0 03/02/2020   ALBUMIN 4.7 03/02/2020   CALCIUM 9.5 03/02/2020   Lab Results  Component Value Date   CHOL 249 (H) 03/02/2020   Lab Results  Component Value Date   HDL 66 03/02/2020   Lab Results  Component Value Date   LDLCALC 168 (H) 03/02/2020   Lab Results  Component Value Date   TRIG 89 03/02/2020   Lab Results  Component Value Date   CHOLHDL 3.8 03/02/2020   No results found for: HGBA1C    Assessment & Plan:   1. Elevated lipids - Will check some labs - Lipid panel; Future - B12 and Folate Panel; Future - Vitamin D (25 hydroxy); Future - Vitamin D (25 hydroxy) - B12 and Folate Panel - Lipid panel  2. History of palpitations - Will do - EKG 12-Lead and possible Cardiology referral.  3. Shortness of breath - She will continue on Albuterol as needed, was advised on medication side effects and to notify clinic. - albuterol (VENTOLIN HFA) 108 (90 Base) MCG/ACT inhaler; Inhale 2 puffs into the lungs every 6 (six) hours as needed for wheezing or shortness of breath.  Dispense: 1 each; Refill: 2     Follow-up: Return in about 2 weeks (around 07/19/2020), or if symptoms worsen or fail to improve.    Stayce Delancy Jerold Coombe, NP

## 2020-07-06 ENCOUNTER — Ambulatory Visit
Admission: RE | Admit: 2020-07-06 | Discharge: 2020-07-06 | Disposition: A | Discharge status: Home / Self Care | Payer: No Typology Code available for payment source | Source: Ambulatory Visit | Attending: Obstetrics and Gynecology | Admitting: Obstetrics and Gynecology

## 2020-07-06 ENCOUNTER — Telehealth: Payer: Self-pay | Admitting: Pharmacist

## 2020-07-06 ENCOUNTER — Ambulatory Visit: Payer: No Typology Code available for payment source | Admitting: Physical Therapy

## 2020-07-06 ENCOUNTER — Other Ambulatory Visit: Payer: Self-pay

## 2020-07-06 DIAGNOSIS — M545 Low back pain, unspecified: Secondary | ICD-10-CM

## 2020-07-06 DIAGNOSIS — R531 Weakness: Secondary | ICD-10-CM

## 2020-07-06 DIAGNOSIS — Z87898 Personal history of other specified conditions: Secondary | ICD-10-CM | POA: Insufficient documentation

## 2020-07-06 DIAGNOSIS — R2689 Other abnormalities of gait and mobility: Secondary | ICD-10-CM

## 2020-07-06 LAB — VITAMIN D 25 HYDROXY (VIT D DEFICIENCY, FRACTURES): Vit D, 25-Hydroxy: 43.7 ng/mL (ref 30.0–100.0)

## 2020-07-06 LAB — LIPID PANEL
Chol/HDL Ratio: 3.5 ratio (ref 0.0–4.4)
Cholesterol, Total: 208 mg/dL — ABNORMAL HIGH (ref 100–199)
HDL: 60 mg/dL (ref >39)
LDL Chol Calc (NIH): 132 mg/dL — ABNORMAL HIGH (ref 0–99)
Triglycerides: 89 mg/dL (ref 0–149)
VLDL Cholesterol Cal: 16 mg/dL (ref 5–40)

## 2020-07-06 LAB — B12 AND FOLATE PANEL
Folate: 7.1 ng/mL
Vitamin B-12: 759 pg/mL (ref 232–1245)

## 2020-07-06 NOTE — Telephone Encounter (Signed)
07/06/2020 3:26:05 PM - ProAir forms to provider-[facility]  -- Elmer Picker - Thursday, July 06, 2020 3:24 PM --Received pharmacy printout for new med-ProAir HFA Inhale 2 puffs into the lungs every 6 hours as needed for wheezing or shortness of breath--Printed [employer] application, patient signed her portion today, sending provider portion to [facility].

## 2020-07-07 ENCOUNTER — Encounter: Payer: Self-pay | Admitting: Physical Therapy

## 2020-07-07 NOTE — Therapy (Signed)
[facility] [address] [place], [place], [address] Phone: [phone]   Fax:  [phone]  Physical Therapy Treatment  Patient Details  Name: Amber Livingston MRN: [id number] Date of Birth: Dec 24, 1960 Referring Provider (PT): Dr. Gillis Santa   Encounter Date: 07/06/2020   PT End of Session - 07/07/20 1724    Visit Number 2    Number of Visits 9    Date for PT Re-Evaluation 08/01/20    Authorization - Visit Number 2    Authorization - Number of Visits 10    PT Start Time 3845    PT Stop Time 1514    PT Time Calculation (min) 42 min    Activity Tolerance Patient tolerated treatment well    Behavior During Therapy [facility] for tasks assessed/performed           Past Medical History:  Diagnosis Date  . Uterine fibroid     Past Surgical History:  Procedure Laterality Date  . ablation    . APPENDECTOMY    . DILATION AND CURETTAGE OF UTERUS     x3 for miscarriage  . LAPAROSCOPY     Fibroid removal    There were no vitals filed for this visit.   Subjective Assessment - 07/07/20 1718    Subjective Pt. states she is very optimistic about participating with PT after initial evaluation.  Pt. reports feeling better after evaluation but states she is concerned about the upcoming full moon/ change in weather.  Pt. states she normally limited to the bed when weather changes happen.  PT discussed importance of movement/ exercise.    Pertinent History fibromyalgia    Patient Stated Goals return to work, improve strength and mobility, decrease pain    Currently in Pain? Yes    Pain Score 1     Pain Location Back    Pain Orientation Lower           Reviewed HEP (pt. Had no questions)  Manual tx.:  Supine L/R hamstring/ piriformis/ lumbar rotn./ knee to chest manual stretches 4x each with static holds (no increase symptoms/ R piriformis stretch "feels like a good stretch") Prone STM to mid-thoracic to lumbar region/ glut. Med./  piriformis muscles. Grade II-III PA grade mobs. To low thoracic/lumbas spine (unilateral)- 2x30 sec.   Prone hypervolt to B lumbar paraspinals/ glut. Musculature. Discussed use of MH or ice.  Gait assessment in hallway     PT Long Term Goals - 07/04/20 1209      PT LONG TERM GOAL #1   Title Pt. will perform 5xSTS in 12 seconds or less to demonstrate improvements in LE strength and functional mobility.    Baseline 11/16: 19.4s    Time 4    Period Weeks    Status New    Target Date 08/01/20      PT LONG TERM GOAL #2   Title Pt. will report ability to vaccuum for greater than 10 mins with no increase in back pain over 2/10 to improve pain free functional mobility.    Baseline 11/16: pt is currently not able to vaccuum for 4-5 minutes with back pain at 5/10    Time 4    Period Weeks    Status New    Target Date 08/01/20      PT LONG TERM GOAL #3   Title Pt. will improve FOTO to 50 to demonstrate improvement in pain free functional mobility.    Baseline 11/16: 35  Time 4    Period Weeks    Status New    Target Date 08/01/20      PT LONG TERM GOAL #4   Title Pt. independent with HEP to increase B hip strength 1/2 muscle grade to improve standing tolerance/ pain-free household tasks.    Baseline Pt. demonstrates 5/5 strength bilaterally, except for bilat hip IR, ER, and flexion which scored 4/5 for bilat for each.    Time 4    Period Weeks    Status New    Target Date 08/01/20                 Plan - 07/07/20 1725    Clinical Impression Statement PT tx. focused on R hip/piriformis stretches and low thoracic/lumbar manual tx.  Pt. presents with good B LE muscle flexibility and reports benefit from static modified piriformis stretches to R hip in supine.  Moderate low thoracic/lumbar paraspinal muscle tightness.  PT discussed trigger point dry needling and pt. may be a candidate for future appts.  Pt. will continue with current HEP and instructed to remain active during  upcoming weather changes this weekend.    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    PT Home Exercise Plan [email or web address]    Consulted and Agree with Plan of Care Patient           Patient will benefit from skilled therapeutic intervention in order to improve the following deficits and impairments:  Abnormal gait, Decreased activity tolerance, Decreased endurance, Decreased mobility, Decreased range of motion, Hypomobility, Difficulty walking, Decreased strength, Increased muscle spasms, Impaired perceived functional ability, Pain  Visit Diagnosis: Chronic bilateral low back pain, unspecified whether sciatica present  Decreased mobility  Decreased strength     Problem List Patient Active Problem List   Diagnosis Date Noted  . History of palpitations 07/05/2020  . Shortness of breath 07/05/2020  . Chronic bilateral low back pain without sciatica 06/06/2020  . Lumbar facet arthropathy 06/06/2020  . Myofascial pain 06/06/2020  . Chronic pain syndrome 06/06/2020  . Cannabis use disorder, mild, abuse 06/06/2020  . Fibromyalgia 06/06/2020  . Elevated lipids 03/09/2020  .  Encounter to establish care 02/22/2020  . Chronic abdominal pain 02/22/2020  . Hot flashes 02/22/2020  . Chronic back pain 02/22/2020  . Anxiety 02/22/2020   Pura Spice, PT, DPT # [id number] 07/07/2020, 5:37 PM  [place] [address] [place], [place], [address] Phone: [phone]   Fax:  [phone]  Name: Amber Livingston MRN: [id number] Date of Birth: [birth year]

## 2020-07-11 ENCOUNTER — Other Ambulatory Visit: Payer: Self-pay | Admitting: Gerontology

## 2020-07-11 ENCOUNTER — Ambulatory Visit: Payer: No Typology Code available for payment source

## 2020-07-11 ENCOUNTER — Other Ambulatory Visit: Payer: Self-pay

## 2020-07-11 DIAGNOSIS — R2689 Other abnormalities of gait and mobility: Secondary | ICD-10-CM

## 2020-07-11 DIAGNOSIS — M545 Low back pain, unspecified: Secondary | ICD-10-CM

## 2020-07-11 NOTE — Therapy (Signed)
[place] [facility] [address] [place], [place], [address] Phone: [phone]   Fax:  [phone]  Physical Therapy Treatment  Patient Details  Name: Amber Livingston MRN: [id number] Date of Birth: Oct 11, 1960 Referring Provider (PT): Dr. Gillis Santa   Encounter Date: 07/11/2020   PT End of Session - 07/11/20 1659    Visit Number 3    Number of Visits 9    Date for PT Re-Evaluation 08/01/20    PT Start Time 1430    PT Stop Time 1515    PT Time Calculation (min) 45 min    Activity Tolerance Patient tolerated treatment well    Behavior During Therapy [facility] for tasks assessed/performed           Past Medical History:  Diagnosis Date  . Uterine fibroid     Past Surgical History:  Procedure Laterality Date  . ablation    . APPENDECTOMY    . DILATION AND CURETTAGE OF UTERUS     x3 for miscarriage  . LAPAROSCOPY     Fibroid removal    There were no vitals filed for this visit.   Subjective Assessment - 07/11/20 1659    Subjective Pt reports that she is doing well today. She denies any low back pain in sitting upon arrival today. She is hopeful about physical therapy being helpful. No specific questions or concerns at this time.    Pertinent History fibromyalgia    Patient Stated Goals return to work, improve strength and mobility, decrease pain    Currently in Pain? No/denies              TREATMENT   Manual Therapy  Supine L/R hamstring/ piriformis/ lumbar rotn./knee to chest manual stretches x 30s each with static holds (no increase symptoms) Prone STM to mid-thoracic to lumbar region bilaterally paraspinals, Utilized theraband roller for glut. Max/med/piriformis muscles. Grade I-II CPA grade mobs T9-L5, 20s/bout x 2 bouts/level;     Trigger Point Dry Needling (TDN), unbilled Education performed with patient regarding potential benefit of TDN. Reviewed precautions and risks with patient. Pt provided verbal consent to  treatment. TDN performed to bilateral lumbar multifidi with 4, 0.25 x 40 single needle placements (2 on each side one at L3 and one at L4) with deep ache reported by patient. Pistoning technique utilized.   Patient denies any pain in sitting today upon arrival.  Continued with hip and lumbar stretches as well as prone soft tissue mobilization and thoracic/lumbar mobilizations.  Initiated trigger point dry needling today with patient performing 2 placements on either side of the lower spine into multifidi.  Patient encouraged to continue HEP, monitor for response to treatment today, and follow-up as scheduled. Pt will benefit from PT services to address deficits in back pain and improve pain-free function at home.                                PT Long Term Goals - 07/04/20 1209      PT LONG TERM GOAL #1   Title Pt. will perform 5xSTS in 12 seconds or less to demonstrate improvements in LE strength and functional mobility.    Baseline 11/16: 19.4s    Time 4    Period Weeks    Status New    Target Date 08/01/20      PT LONG TERM GOAL #2   Title Pt. will report ability to vaccuum for  greater than 10 mins with no increase in back pain over 2/10 to improve pain free functional mobility.    Baseline 11/16: pt is currently not able to vaccuum for 4-5 minutes with back pain at 5/10    Time 4    Period Weeks    Status New    Target Date 08/01/20      PT LONG TERM GOAL #3   Title Pt. will improve FOTO to 50 to demonstrate improvement in pain free functional mobility.    Baseline 11/16: 35    Time 4    Period Weeks    Status New    Target Date 08/01/20      PT LONG TERM GOAL #4   Title Pt. independent with HEP to increase B hip strength 1/2 muscle grade to improve standing tolerance/ pain-free household tasks.    Baseline Pt. demonstrates 5/5 strength bilaterally, except for bilat hip IR, ER, and flexion which scored 4/5 for bilat for each.    Time 4    Period  Weeks    Status New    Target Date 08/01/20                 Plan - 07/11/20 1700    Clinical Impression Statement Patient denies any pain in sitting today upon arrival.  Continued with hip and lumbar stretches as well as prone soft tissue mobilization and thoracic/lumbar mobilizations.  Initiated trigger point dry needling today with patient performing 2 placements on either side of the lower spine into multifidi.  Patient encouraged to continue HEP, monitor for response to treatment today, and follow-up as scheduled. Pt will benefit from PT services to address deficits in back pain and improve pain-free function at home.    Personal Factors and Comorbidities Fitness;Past/Current Experience;Time since onset of injury/illness/exacerbation    Examination-Activity Limitations Bed Mobility;Lift;Stairs;Squat;Stand;Transfers;Sit    Examination-Participation Restrictions Community Activity;Yard Work    Stability/Clinical Decision Making Stable/Uncomplicated    Rehab Potential Fair    PT Frequency 2x / week    PT Duration 4 weeks    PT Treatment/Interventions ADLs/Self Care Home Management;Aquatic Therapy;Cryotherapy;Gait training;Stair training;Moist Heat;Functional mobility training;Therapeutic activities;Therapeutic exercise;Balance training;Neuromuscular re-education;Patient/family education;Manual techniques;Dry needling;Electrical Stimulation;Spinal Manipulations;Joint Manipulations    PT Next Visit Plan Prone manual tx, TDN, consider pain education as well as incorporating mindfulness into sessions.    PT Home Exercise Plan [email or web address]    Consulted and Agree with Plan of Care Patient           Patient will benefit from skilled therapeutic intervention in order to improve the following deficits and impairments:  Abnormal gait, Decreased activity tolerance, Decreased endurance, Decreased mobility, Decreased range of motion, Hypomobility, Difficulty walking, Decreased strength, Increased  muscle spasms, Impaired perceived functional ability, Pain  Visit Diagnosis: Chronic bilateral low back pain, unspecified whether sciatica present  Decreased mobility     Problem List Patient Active Problem List   Diagnosis Date Noted  . History of palpitations 07/05/2020  . Shortness of breath 07/05/2020  . Chronic bilateral low back pain without sciatica 06/06/2020  . Lumbar facet arthropathy 06/06/2020  . Myofascial pain 06/06/2020  . Chronic pain syndrome 06/06/2020  . Cannabis use disorder, mild, abuse 06/06/2020  . Fibromyalgia 06/06/2020  . Elevated lipids 03/09/2020  . Encounter to establish care 02/22/2020  . Chronic abdominal pain 02/22/2020  . Hot flashes 02/22/2020  . Chronic back pain 02/22/2020  . Anxiety 02/22/2020   Lyndel Safe Min Collymore PT, DPT, GCS  Mireya Meditz 07/11/2020, 5:06 PM  [facility] [address] [place], [place], [address] Phone: [phone]   Fax:  [phone]  Name: Megyn Leng MRN: [id number] Date of Birth: [birth year]

## 2020-07-18 ENCOUNTER — Ambulatory Visit: Payer: No Typology Code available for payment source

## 2020-07-18 ENCOUNTER — Other Ambulatory Visit: Payer: Self-pay

## 2020-07-18 DIAGNOSIS — M545 Low back pain, unspecified: Secondary | ICD-10-CM

## 2020-07-18 DIAGNOSIS — G8929 Other chronic pain: Secondary | ICD-10-CM

## 2020-07-18 NOTE — Therapy (Signed)
[place] [facility] [address] [place], [place], [address] Phone: [phone]   Fax:  [phone]  Physical Therapy Treatment  Patient Details  Name: Maysa Lynn MRN: [id number] Date of Birth: 01/27/1961 Referring Provider (PT): Dr. Gillis Santa   Encounter Date: 07/18/2020   PT End of Session - 07/18/20 1430    Visit Number 4    Number of Visits 9    Date for PT Re-Evaluation 08/01/20    PT Start Time 1430    PT Stop Time 1515    PT Time Calculation (min) 45 min    Activity Tolerance Patient tolerated treatment well    Behavior During Therapy [facility] for tasks assessed/performed           Past Medical History:  Diagnosis Date  . Uterine fibroid     Past Surgical History:  Procedure Laterality Date  . ablation    . APPENDECTOMY    . DILATION AND CURETTAGE OF UTERUS     x3 for miscarriage  . LAPAROSCOPY     Fibroid removal    There were no vitals filed for this visit.   Subjective Assessment - 07/18/20 1429    Subjective Pt reports that she is doing well today. She denies any low back pain in sitting upon arrival today. She reports increase in soreness after last therapy session. She started having some numbness in her low back down into her buttocks on Friday 07/14/20 which has persisted until today. Numbness does not extend into thighs and pt denies any new onset bowel/bladder incontinence conincidng with the numbness. No specific questions upon arrival.    Pertinent History fibromyalgia    Patient Stated Goals return to work, improve strength and mobility, decrease pain    Currently in Pain? No/denies              TREATMENT   Manual Therapy  Supine L/R hamstring/ piriformis/ lumbar rotn./knee to chest, figure 4 manual stretches x 30s each with static holds (no increase symptoms), added ankle DF/PF during HS stretch for sciatic nerve glides; Prone STM to mid-thoracic to lumbar region bilaterally paraspinals,  Utilized theraband roller for glut. Max/med/piriformis muscles. Grade I-II CPA grade mobs L1-L5, 20s/bout x 2 bouts/level; Grade I-II UPA grade mobs L1-L5 bilateral, 20s/bout x 2 bouts/level;   Electrical Stimulation  NMES with patient in prone using large muscle spasm setting on Empi Continuum unit at default setting (pulse rate: 80Hz , off time: 5s, symmetrical waveform, 300 microsec pulse width, 2s ramp up/down, 10s on time/5s off time) at pt tolerable intensity of 39 bilateral x 10 minutes, concurrent moist heat on low back;     Patient denies any pain in sitting today upon arrival.  Continued with hip and lumbar stretches as well as prone soft tissue mobilization and thoracic/lumbar mobilizations. Given increase in soreness after last session deferred trigger point dry needling today. Utilized NMES to lumbar spine to assess for pain relief to see if this might benefit pt in the further. Patient encouraged to continue HEP, monitor for response to treatment today, and follow-up as scheduled. Pt will benefit from PT services to address deficits in back pain and improve pain-free function at home.                                            PT Long Term Goals - 07/04/20 1209  PT LONG TERM GOAL #1   Title Pt. will perform 5xSTS in 12 seconds or less to demonstrate improvements in LE strength and functional mobility.    Baseline 11/16: 19.4s    Time 4    Period Weeks    Status New    Target Date 08/01/20      PT LONG TERM GOAL #2   Title Pt. will report ability to vaccuum for greater than 10 mins with no increase in back pain over 2/10 to improve pain free functional mobility.    Baseline 11/16: pt is currently not able to vaccuum for 4-5 minutes with back pain at 5/10    Time 4    Period Weeks    Status New    Target Date 08/01/20      PT LONG TERM GOAL #3   Title Pt. will improve FOTO to 50 to demonstrate improvement in pain free functional  mobility.    Baseline 11/16: 35    Time 4    Period Weeks    Status New    Target Date 08/01/20      PT LONG TERM GOAL #4   Title Pt. independent with HEP to increase B hip strength 1/2 muscle grade to improve standing tolerance/ pain-free household tasks.    Baseline Pt. demonstrates 5/5 strength bilaterally, except for bilat hip IR, ER, and flexion which scored 4/5 for bilat for each.    Time 4    Period Weeks    Status New    Target Date 08/01/20                 Plan - 07/18/20 1431    Clinical Impression Statement Patient denies any pain in sitting today upon arrival.  Continued with hip and lumbar stretches as well as prone soft tissue mobilization and thoracic/lumbar mobilizations. Given increase in soreness after last session deferred trigger point dry needling today. Utilized NMES to lumbar spine to assess for pain relief to see if this might benefit pt in the further. Patient encouraged to continue HEP, monitor for response to treatment today, and follow-up as scheduled. Pt will benefit from PT services to address deficits in back pain and improve pain-free function at home.    Personal Factors and Comorbidities Fitness;Past/Current Experience;Time since onset of injury/illness/exacerbation    Examination-Activity Limitations Bed Mobility;Lift;Stairs;Squat;Stand;Transfers;Sit    Examination-Participation Restrictions Community Activity;Yard Work    Stability/Clinical Decision Making Stable/Uncomplicated    Rehab Potential Fair    PT Frequency 2x / week    PT Duration 4 weeks    PT Treatment/Interventions ADLs/Self Care Home Management;Aquatic Therapy;Cryotherapy;Gait training;Stair training;Moist Heat;Functional mobility training;Therapeutic activities;Therapeutic exercise;Balance training;Neuromuscular re-education;Patient/family education;Manual techniques;Dry needling;Electrical Stimulation;Spinal Manipulations;Joint Manipulations    PT Next Visit Plan Prone manual tx,  TDN, consider pain education as well as incorporating mindfulness into sessions.    PT Home Exercise Plan [email or web address]    Consulted and Agree with Plan of Care Patient           Patient will benefit from skilled therapeutic intervention in order to improve the following deficits and impairments:  Abnormal gait, Decreased activity tolerance, Decreased endurance, Decreased mobility, Decreased range of motion, Hypomobility, Difficulty walking, Decreased strength, Increased muscle spasms, Impaired perceived functional ability, Pain  Visit Diagnosis: Chronic bilateral low back pain, unspecified whether sciatica present     Problem List Patient Active Problem List   Diagnosis Date Noted  . History of palpitations 07/05/2020  . Shortness of breath 07/05/2020  . Chronic bilateral  low back pain without sciatica 06/06/2020  . Lumbar facet arthropathy 06/06/2020  . Myofascial pain 06/06/2020  . Chronic pain syndrome 06/06/2020  . Cannabis use disorder, mild, abuse 06/06/2020  . Fibromyalgia 06/06/2020  . Elevated lipids 03/09/2020  . Encounter to establish care 02/22/2020  . Chronic abdominal pain 02/22/2020  . Hot flashes 02/22/2020  . Chronic back pain 02/22/2020  . Anxiety 02/22/2020   Phillips Grout PT, DPT, GCS  Breon Rehm 07/18/2020, 4:15 PM  [facility] [address] [place], [place], [address] Phone: [phone]   Fax:  [phone]  Name: Odalys Win MRN: [id number] Date of Birth: [birth year]

## 2020-07-20 ENCOUNTER — Other Ambulatory Visit: Payer: Self-pay

## 2020-07-20 ENCOUNTER — Ambulatory Visit
Payer: No Typology Code available for payment source | Attending: Student in an Organized Health Care Education/Training Program

## 2020-07-20 DIAGNOSIS — G8929 Other chronic pain: Secondary | ICD-10-CM | POA: Insufficient documentation

## 2020-07-20 DIAGNOSIS — R531 Weakness: Secondary | ICD-10-CM | POA: Insufficient documentation

## 2020-07-20 DIAGNOSIS — M545 Low back pain, unspecified: Secondary | ICD-10-CM | POA: Insufficient documentation

## 2020-07-20 DIAGNOSIS — R2689 Other abnormalities of gait and mobility: Secondary | ICD-10-CM | POA: Insufficient documentation

## 2020-07-20 NOTE — Therapy (Addendum)
[facility] [address] [place], [place], [address] Phone: [phone]   Fax:  [phone]  Physical Therapy Treatment  Patient Details  Name: Amber Livingston MRN: [id number] Date of Birth: 1960-11-30 Referring Provider (PT): Dr. Gillis Santa   Encounter Date: 07/20/2020   PT End of Session - 07/20/20 1526    Visit Number 5    Number of Visits 9    Date for PT Re-Evaluation 08/01/20    PT Start Time 1518    PT Stop Time 1600    PT Time Calculation (min) 42 min    Activity Tolerance Patient tolerated treatment well    Behavior During Therapy [facility] for tasks assessed/performed           Past Medical History:  Diagnosis Date  . Uterine fibroid     Past Surgical History:  Procedure Laterality Date  . ablation    . APPENDECTOMY    . DILATION AND CURETTAGE OF UTERUS     x3 for miscarriage  . LAPAROSCOPY     Fibroid removal    There were no vitals filed for this visit.   Subjective Assessment - 07/20/20 1536    Subjective Pt reports that she is having a fibromyalgia flare today. Her numbness has now subsided in her low back. She reports improved ability to perform lumbar extension and shoulder overhead abduction since starting therapy which is encouraging for her.    Pertinent History fibromyalgia    Patient Stated Goals return to work, improve strength and mobility, decrease pain    Currently in Pain? No/denies              TREATMENT   Manual Therapy  Prone STM to mid-thoracic to lumbar region bilaterally paraspinals, Utilized theraband roller for glut. Max/med/piriformis muscles. Grade I-II CPA grade mobs L1-L5, 20s/bout x 3 bouts/level; Grade I-II UPA grade mobs L1-L5 bilateral, 20s/bout x 3 bouts/level;   Electrical Stimulation  NMES with patient in prone using large muscle spasm setting on Empi Continuum unit at default setting (pulse rate: 80Hz , off time: 5s, symmetrical waveform, 300 microsec pulse width,  2s ramp up/down, 10s on time/5s off time) at pt tolerable intensity of 39 bilateral x 8 minutes, concurrent moist heat on low back;     Continued with hip and lumbar stretches as well as prone soft tissue mobilization and thoracic/lumbar mobilizations. Utilized NMES again to lumbar spine for relief of muscles spasm. Patient encouraged to continue HEP, monitor for response to treatment today, and follow-up as scheduled. Pt will benefit from PT services to address deficits in back pain and improve pain-free function at home.                                       PT Long Term Goals - 07/04/20 1209      PT LONG TERM GOAL #1   Title Pt. will perform 5xSTS in 12 seconds or less to demonstrate improvements in LE strength and functional mobility.    Baseline 11/16: 19.4s    Time 4    Period Weeks    Status New    Target Date 08/01/20      PT LONG TERM GOAL #2   Title Pt. will report ability to vaccuum for greater than 10 mins with no increase in back pain over 2/10 to improve pain free functional mobility.    Baseline 11/16:  pt is currently not able to vaccuum for 4-5 minutes with back pain at 5/10    Time 4    Period Weeks    Status New    Target Date 08/01/20      PT LONG TERM GOAL #3   Title Pt. will improve FOTO to 50 to demonstrate improvement in pain free functional mobility.    Baseline 11/16: 35    Time 4    Period Weeks    Status New    Target Date 08/01/20      PT LONG TERM GOAL #4   Title Pt. independent with HEP to increase B hip strength 1/2 muscle grade to improve standing tolerance/ pain-free household tasks.    Baseline Pt. demonstrates 5/5 strength bilaterally, except for bilat hip IR, ER, and flexion which scored 4/5 for bilat for each.    Time 4    Period Weeks    Status New    Target Date 08/01/20                 Plan - 07/20/20 1716    Clinical Impression Statement Continued with hip and lumbar stretches as well as  prone soft tissue mobilization and thoracic/lumbar mobilizations. Utilized NMES again to lumbar spine for relief of muscles spasm. Patient encouraged to continue HEP, monitor for response to treatment today, and follow-up as scheduled. Pt will benefit from PT services to address deficits in back pain and improve pain-free function at home.    Personal Factors and Comorbidities Fitness;Past/Current Experience;Time since onset of injury/illness/exacerbation    Examination-Activity Limitations Bed Mobility;Lift;Stairs;Squat;Stand;Transfers;Sit    Examination-Participation Restrictions Community Activity;Yard Work    Stability/Clinical Decision Making Stable/Uncomplicated    Rehab Potential Fair    PT Frequency 2x / week    PT Duration 4 weeks    PT Treatment/Interventions ADLs/Self Care Home Management;Aquatic Therapy;Cryotherapy;Gait training;Stair training;Moist Heat;Functional mobility training;Therapeutic activities;Therapeutic exercise;Balance training;Neuromuscular re-education;Patient/family education;Manual techniques;Dry needling;Electrical Stimulation;Spinal Manipulations;Joint Manipulations    PT Next Visit Plan Prone manual tx, TDN, consider pain education as well as incorporating mindfulness into sessions.    PT Home Exercise Plan [email or web address]    Consulted and Agree with Plan of Care Patient           Patient will benefit from skilled therapeutic intervention in order to improve the following deficits and impairments:  Abnormal gait, Decreased activity tolerance, Decreased endurance, Decreased mobility, Decreased range of motion, Hypomobility, Difficulty walking, Decreased strength, Increased muscle spasms, Impaired perceived functional ability, Pain  Visit Diagnosis: Chronic bilateral low back pain, unspecified whether sciatica present  Decreased strength     Problem List Patient Active Problem List   Diagnosis Date Noted  . History of palpitations 07/05/2020  . Shortness of  breath 07/05/2020  . Chronic bilateral low back pain without sciatica 06/06/2020  . Lumbar facet arthropathy 06/06/2020  . Myofascial pain 06/06/2020  . Chronic pain syndrome 06/06/2020  . Cannabis use disorder, mild, abuse 06/06/2020  . Fibromyalgia 06/06/2020  . Elevated lipids 03/09/2020  . Encounter to establish care 02/22/2020  . Chronic abdominal pain 02/22/2020  . Hot flashes 02/22/2020  . Chronic back pain 02/22/2020  . Anxiety 02/22/2020   Phillips Grout PT, DPT, GCS  Rhythm Wigfall 07/20/2020, 5:24 PM  [facility] [address] [place], [place], [address] Phone: [phone]   Fax:  [phone]  Name: Amber Livingston MRN: [id number] Date of Birth: Oct 09, 1960

## 2020-07-21 ENCOUNTER — Telehealth: Payer: Self-pay | Admitting: Pharmacist

## 2020-07-21 NOTE — Telephone Encounter (Signed)
07/21/2020 4:85:92 AM - [employer] application faxed to for [employer] - Friday, July 21, 2020 9:36 AM --[employer] Gess [employer] application for ProAir HFA Inhale 2 puffs into the lungs every 6 hours as needed for wheezing or shortness of breath.

## 2020-07-25 ENCOUNTER — Ambulatory Visit: Payer: No Typology Code available for payment source

## 2020-07-25 ENCOUNTER — Other Ambulatory Visit: Payer: Self-pay | Admitting: Gerontology

## 2020-07-25 ENCOUNTER — Other Ambulatory Visit: Payer: Self-pay

## 2020-07-25 ENCOUNTER — Ambulatory Visit: Payer: Self-pay

## 2020-07-25 DIAGNOSIS — R531 Weakness: Secondary | ICD-10-CM

## 2020-07-25 DIAGNOSIS — Z87898 Personal history of other specified conditions: Secondary | ICD-10-CM

## 2020-07-25 DIAGNOSIS — M545 Low back pain, unspecified: Secondary | ICD-10-CM

## 2020-07-25 NOTE — Therapy (Signed)
[facility] [address] [place], [place], [address] Phone: [phone]   Fax:  [phone]  Physical Therapy Treatment  Patient Details  Name: Amber Livingston MRN: [id number] Date of Birth: Aug 18, 1961 Referring Provider (PT): Dr. Gillis Santa   Encounter Date: 07/25/2020   PT End of Session - 07/25/20 1429    Visit Number 6    Number of Visits 9    Date for PT Re-Evaluation 08/01/20    PT Start Time 1430    PT Stop Time 1515    PT Time Calculation (min) 45 min    Activity Tolerance Patient tolerated treatment well    Behavior During Therapy [facility] for tasks assessed/performed           Past Medical History:  Diagnosis Date  . Uterine fibroid     Past Surgical History:  Procedure Laterality Date  . ablation    . APPENDECTOMY    . DILATION AND CURETTAGE OF UTERUS     x3 for miscarriage  . LAPAROSCOPY     Fibroid removal    There were no vitals filed for this visit.   Subjective Assessment - 07/25/20 1428    Subjective Pt reports that she is doing well today. She states that after the last session she felt great and was able to clean her whole house. However with the weather changing over the weekend she had an increase in her fibromyalgia symptoms. She denies any pain upon arrival today.    Pertinent History fibromyalgia    Patient Stated Goals return to work, improve strength and mobility, decrease pain    Currently in Pain? No/denies              TREATMENT   Manual Therapy  Moist heat to lumbar spine during interval history x 5 minutes; Prone STM to lumbar region bilaterally paraspinals and quadratus lumborum, Utilized theraband roller for glut. Max/med/piriformis muscles. Also utilized percussive STM with Hypervolt; Grade I-II CPA grade mobs L1-L5, 20s/bout x 3 bouts/level; Grade I-II UPA grade mobs L1-L5 bilateral, 20s/bout x 3 bouts/level;   Ther-ex  Prone straight and bent knee hip extension x 10  each bilaterally; Prone HS curls with manual resistance from therapist x 10 bilaterally; Quadruped cat/camel x 10 each direction; Quadruped alternating arm lifts x 10 BUE, tactile cues from therapist for core stability; Quadruped alternating leg lifts x 10 BUE, tactile cues from therapist for core stability;  Child's pose stretch x 20s, right and left bias x 20s each with overpressure into spinal flexion by therapist;    Pt educated throughout session about proper posture and technique with exercises. Improved exercise technique, movement at target joints, use of target muscles after min to mod verbal, visual, tactile cues.    Continued with lumbar stretches as well as prone soft tissue mobilization andlumbar mobilizations. Introduced additional stabilization strengthening for abdominals and low back today. Mild increase in back pain with strengthening. Patient encouraged to continue HEP, monitor for response to treatment today, and follow-up as scheduled. Pt will benefit from PT services to address deficits in back pain and improve pain-free function at home.                              PT Long Term Goals - 07/04/20 1209      PT LONG TERM GOAL #1   Title Pt. will perform 5xSTS in 12 seconds or less to demonstrate improvements  in LE strength and functional mobility.    Baseline 11/16: 19.4s    Time 4    Period Weeks    Status New    Target Date 08/01/20      PT LONG TERM GOAL #2   Title Pt. will report ability to vaccuum for greater than 10 mins with no increase in back pain over 2/10 to improve pain free functional mobility.    Baseline 11/16: pt is currently not able to vaccuum for 4-5 minutes with back pain at 5/10    Time 4    Period Weeks    Status New    Target Date 08/01/20      PT LONG TERM GOAL #3   Title Pt. will improve FOTO to 50 to demonstrate improvement in pain free functional mobility.    Baseline 11/16: 35    Time 4    Period Weeks     Status New    Target Date 08/01/20      PT LONG TERM GOAL #4   Title Pt. independent with HEP to increase B hip strength 1/2 muscle grade to improve standing tolerance/ pain-free household tasks.    Baseline Pt. demonstrates 5/5 strength bilaterally, except for bilat hip IR, ER, and flexion which scored 4/5 for bilat for each.    Time 4    Period Weeks    Status New    Target Date 08/01/20                 Plan - 07/25/20 1540    Clinical Impression Statement Continued with lumbar stretches as well as prone soft tissue mobilization andlumbar mobilizations. Introduced additional stabilization strengthening for abdominals and low back today. Mild increase in back pain with strengthening. Patient encouraged to continue HEP, monitor for response to treatment today, and follow-up as scheduled. Pt will benefit from PT services to address deficits in back pain and improve pain-free function at home.    Personal Factors and Comorbidities Fitness;Past/Current Experience;Time since onset of injury/illness/exacerbation    Examination-Activity Limitations Bed Mobility;Lift;Stairs;Squat;Stand;Transfers;Sit    Examination-Participation Restrictions Community Activity;Yard Work    Stability/Clinical Decision Making Stable/Uncomplicated    Rehab Potential Fair    PT Frequency 2x / week    PT Duration 4 weeks    PT Treatment/Interventions ADLs/Self Care Home Management;Aquatic Therapy;Cryotherapy;Gait training;Stair training;Moist Heat;Functional mobility training;Therapeutic activities;Therapeutic exercise;Balance training;Neuromuscular re-education;Patient/family education;Manual techniques;Dry needling;Electrical Stimulation;Spinal Manipulations;Joint Manipulations    PT Next Visit Plan Prone manual tx, TDN, consider pain education as well as incorporating mindfulness into sessions.    PT Home Exercise Plan [email or web address]    Consulted and Agree with Plan of Care Patient           Patient will  benefit from skilled therapeutic intervention in order to improve the following deficits and impairments:  Abnormal gait, Decreased activity tolerance, Decreased endurance, Decreased mobility, Decreased range of motion, Hypomobility, Difficulty walking, Decreased strength, Increased muscle spasms, Impaired perceived functional ability, Pain  Visit Diagnosis: Chronic bilateral low back pain, unspecified whether sciatica present  Decreased strength     Problem List Patient Active Problem List   Diagnosis Date Noted  . History of palpitations 07/05/2020  . Shortness of breath 07/05/2020  . Chronic bilateral low back pain without sciatica 06/06/2020  . Lumbar facet arthropathy 06/06/2020  . Myofascial pain 06/06/2020  . Chronic pain syndrome 06/06/2020  . Cannabis use disorder, mild, abuse 06/06/2020  . Fibromyalgia 06/06/2020  . Elevated lipids 03/09/2020  . Encounter to establish care  02/22/2020  . Chronic abdominal pain 02/22/2020  . Hot flashes 02/22/2020  . Chronic back pain 02/22/2020  . Anxiety 02/22/2020   Phillips Grout PT, DPT, GCS  Atina Feeley 07/25/2020, 3:51 PM  [facility] [address] [place], [place], [address] Phone: [phone]   Fax:  [phone]  Name: Gretel Cantu MRN: [id number] Date of Birth: 1961-08-12

## 2020-07-26 ENCOUNTER — Ambulatory Visit: Payer: Self-pay | Admitting: Licensed Clinical Social Worker

## 2020-07-26 DIAGNOSIS — F451 Undifferentiated somatoform disorder: Secondary | ICD-10-CM

## 2020-07-26 DIAGNOSIS — F1220 Cannabis dependence, uncomplicated: Secondary | ICD-10-CM

## 2020-07-26 NOTE — BH Specialist Note (Signed)
Integrated Behavioral Health Follow Up In-Person Visit  MRN: [id number] Name: Amber Livingston   Total time: 30 minutes  Types of Service: [place] ([facility])  Interpretor:No. Interpretor Name and Language: n/a  Subjective: Amber Livingston is a 59 y.o. female accompanied by herself Patient was referred by [name], NP  for mental health Patient reports the following symptoms/concerns: The patient apologized for missing the first call for today's appointment and explained she was in the bathroom and her phone didn't ring. The patient notes that her anxiety and depression have improved lately. She states that she is in a lot of pain from  neck stenosis today because the weather has turned so cold. The patient stated that she was informed she has something going on with her heart and that she is waiting to hear form a cardiologist regarding an appointment. She dicussed that she has been to physical therapy six time now trying to work through her issues. She states that she is ready to do longer sessions and work on her mental health but she is not feeling very well and needs to end the session early so she can go rest today. The patient denied any suicidal or homicidal thoughts.  Duration of problem: ; Severity of problem: moderate  Objective: Mood: Euthymic and Affect: Appropriate Risk of harm to self or others: No plan to harm self or others  Life Context: Family and Social: see above School/Work: see above Self-Care: see above Life Changes: see above  Patient and/or Family's Strengths/Protective Factors: Concrete supports in place (healthy food, safe environments, etc.) and Sense of purpose  Goals Addressed: Patient will: 1.  Reduce symptoms of: agitation, anxiety, depression and stress  2.  Increase knowledge and/or ability of: coping skills, self-management skills and stress reduction  3.  Demonstrate ability to: Increase healthy adjustment to current life  circumstances  Progress towards Goals: Ongoing  Interventions: Interventions utilized:  Supportive Counseling was utilized by the clinician during today's follow up session. Clinician processed with patient how she has been doing since the last follow up session. Clinician utilized reflective listening encouraging the patient to ventilate her feelings regarding her current life circumstances Clinician encouraged patient to continue with the session. Further, the clinician encouraged the patient to adhere to the care plan by taking her medication as prescribed. Clinician measured the patients anxiety and depression on a numerical scale. Standardized Assessments completed: GAD-7 and PHQ 9  GAD-7   10 PHQ-9   12  Assessment: Patient currently experiencing see above  Patient may benefit from See above  Plan: 1. Follow up with behavioral health clinician on : 08/31/2019 at 12:00 pm  2. Behavioral recommendations:  3. Referral(s): [place] (In Clinic) 4. "From scale of 1-10, how likely are you to follow plan?":   Lesli Albee, Student-Social Work

## 2020-07-27 ENCOUNTER — Ambulatory Visit: Payer: No Typology Code available for payment source

## 2020-07-27 ENCOUNTER — Ambulatory Visit: Payer: Self-pay

## 2020-07-27 ENCOUNTER — Other Ambulatory Visit: Payer: Self-pay

## 2020-07-27 DIAGNOSIS — R531 Weakness: Secondary | ICD-10-CM

## 2020-07-27 DIAGNOSIS — G8929 Other chronic pain: Secondary | ICD-10-CM

## 2020-07-27 DIAGNOSIS — M545 Low back pain, unspecified: Secondary | ICD-10-CM

## 2020-07-27 NOTE — Therapy (Signed)
[facility] [address]. [place], [place], [address] Phone: [phone]   Fax:  [phone]  Physical Therapy Treatment  Patient Details  Name: Amber Livingston MRN: [id number] Date of Birth: [birth year] Referring Provider (PT): Dr. Gillis Santa   Encounter Date: 07/27/2020   PT End of Session - 07/27/20 1446    Visit Number 7    Number of Visits 9    Date for PT Re-Evaluation 08/01/20    PT Start Time 1435    PT Stop Time 1515    PT Time Calculation (min) 40 min    Activity Tolerance Patient tolerated treatment well    Behavior During Therapy [facility] for tasks assessed/performed           Past Medical History:  Diagnosis Date  . Uterine fibroid     Past Surgical History:  Procedure Laterality Date  . ablation    . APPENDECTOMY    . DILATION AND CURETTAGE OF UTERUS     x3 for miscarriage  . LAPAROSCOPY     Fibroid removal    There were no vitals filed for this visit.   Subjective Assessment - 07/27/20 1554    Subjective Pt reports that she is doing well today with the exception of some side effects from her Covid booster that she had yesterday. She denies any pain upon arrival today.  No specific questions or concerns.    Pertinent History fibromyalgia    Patient Stated Goals return to work, improve strength and mobility, decrease pain    Currently in Pain? No/denies            TREATMENT   Manual Therapy Moist heat to lumbar spine during interval history x 5 minutes; Prone STM to lumbar region bilaterally paraspinals and quadratus lumborum, Utilized theraband roller for glut. Max/med/piriformis muscles. Also utilized percussive STM with Hypervolt; Grade I-II CPA grade mobs L1-L5, 20s/bout x2bouts/level; Grade I-II UPA grade mobs L1-L5 bilateral, 20s/bout x2bouts/level;   Ther-ex  Prone straight and bent knee hip extension x 10 each bilaterally; Prone HS curls with manual resistance from therapist x 10  bilaterally; Side-lying hip abduction with manual resistance from therapist x10 BLE; Side-lying clams with manual resistance from therapist x10 BLE; Quadruped cat/camel x 10 each direction; Quadruped alternating leg lifts x 10 BUE, tactile cues from therapist for core stability;  Quadruped bird dogs x5, tactile cues from therapist for core stability;   Pt educated throughout session about proper posture and technique with exercises. Improved exercise technique, movement at target joints, use of target muscles after min to mod verbal, visual, tactile cues.    Continued with lumbar stretches as well as prone soft tissue mobilization and lumbar mobilizations. Introduced additional stabilization strengthening for abdominals, hips, and low back today.  No increase in back pain with strengthening.Patient encouraged to continue HEP, monitor for response to treatment today, and follow-up as scheduled.  Updated outcome measures, goals, and recertification at next visit.  Pt will benefit from PT services to address deficits in back pain and improve pain-free function at home.                                  PT Long Term Goals - 07/04/20 1209      PT LONG TERM GOAL #1   Title Pt. will perform 5xSTS in 12 seconds or less to demonstrate improvements in LE strength and functional mobility.  Baseline 11/16: 19.4s    Time 4    Period Weeks    Status New    Target Date 08/01/20      PT LONG TERM GOAL #2   Title Pt. will report ability to vaccuum for greater than 10 mins with no increase in back pain over 2/10 to improve pain free functional mobility.    Baseline 11/16: pt is currently not able to vaccuum for 4-5 minutes with back pain at 5/10    Time 4    Period Weeks    Status New    Target Date 08/01/20      PT LONG TERM GOAL #3   Title Pt. will improve FOTO to 50 to demonstrate improvement in pain free functional mobility.    Baseline 11/16: 35    Time 4     Period Weeks    Status New    Target Date 08/01/20      PT LONG TERM GOAL #4   Title Pt. independent with HEP to increase B hip strength 1/2 muscle grade to improve standing tolerance/ pain-free household tasks.    Baseline Pt. demonstrates 5/5 strength bilaterally, except for bilat hip IR, ER, and flexion which scored 4/5 for bilat for each.    Time 4    Period Weeks    Status New    Target Date 08/01/20                 Plan - 07/27/20 1555    Clinical Impression Statement Continued with lumbar stretches as well as prone soft tissue mobilization and lumbar mobilizations. Introduced additional stabilization strengthening for abdominals, hips, and low back today.  No increase in back pain with strengthening. Patient encouraged to continue HEP, monitor for response to treatment today, and follow-up as scheduled.  Updated outcome measures, goals, and recertification at next visit.  Pt will benefit from PT services to address deficits in back pain and improve pain-free function at home.    Personal Factors and Comorbidities Fitness;Past/Current Experience;Time since onset of injury/illness/exacerbation    Examination-Activity Limitations Bed Mobility;Lift;Stairs;Squat;Stand;Transfers;Sit    Examination-Participation Restrictions Community Activity;Yard Work    Stability/Clinical Decision Making Stable/Uncomplicated    Rehab Potential Fair    PT Frequency 2x / week    PT Duration 4 weeks    PT Treatment/Interventions ADLs/Self Care Home Management;Aquatic Therapy;Cryotherapy;Gait training;Stair training;Moist Heat;Functional mobility training;Therapeutic activities;Therapeutic exercise;Balance training;Neuromuscular re-education;Patient/family education;Manual techniques;Dry needling;Electrical Stimulation;Spinal Manipulations;Joint Manipulations    PT Next Visit Plan Update outcome measures, goals, recertification, prone manual tx, TDN, consider pain education as well as incorporating  mindfulness into sessions.    PT Home Exercise Plan [email or web address]    Consulted and Agree with Plan of Care Patient           Patient will benefit from skilled therapeutic intervention in order to improve the following deficits and impairments:  Abnormal gait,Decreased activity tolerance,Decreased endurance,Decreased mobility,Decreased range of motion,Hypomobility,Difficulty walking,Decreased strength,Increased muscle spasms,Impaired perceived functional ability,Pain  Visit Diagnosis: Chronic bilateral low back pain, unspecified whether sciatica present  Decreased strength     Problem List Patient Active Problem List   Diagnosis Date Noted  . History of palpitations 07/05/2020  . Shortness of breath 07/05/2020  . Chronic bilateral low back pain without sciatica 06/06/2020  . Lumbar facet arthropathy 06/06/2020  . Myofascial pain 06/06/2020  . Chronic pain syndrome 06/06/2020  . Cannabis use disorder, mild, abuse 06/06/2020  . Fibromyalgia 06/06/2020  . Elevated lipids 03/09/2020  . Encounter to establish care  02/22/2020  . Chronic abdominal pain 02/22/2020  . Hot flashes 02/22/2020  . Chronic back pain 02/22/2020  . Anxiety 02/22/2020   Phillips Grout PT, DPT, GCS  Amber Livingston 07/27/2020, 3:58 PM  [place] [facility] [address]. [place], [place], [address] Phone: [phone]   Fax:  [phone]  Name: Amber Livingston MRN: [id number] Date of Birth: 03/25/1961

## 2020-08-01 ENCOUNTER — Ambulatory Visit: Payer: No Typology Code available for payment source

## 2020-08-03 ENCOUNTER — Ambulatory Visit: Payer: No Typology Code available for payment source

## 2020-08-03 ENCOUNTER — Other Ambulatory Visit: Payer: Self-pay

## 2020-08-03 DIAGNOSIS — M545 Low back pain, unspecified: Secondary | ICD-10-CM

## 2020-08-03 DIAGNOSIS — R531 Weakness: Secondary | ICD-10-CM

## 2020-08-03 NOTE — Therapy (Signed)
[facility] [facility] [address]. [place], [place], [address] Phone: [phone]   Fax:  [phone]  Physical Therapy Treatment/Recertification  Patient Details  Name: Amber Livingston MRN: [id number] Date of Birth: [birth year] Referring Provider (PT): Dr. Gillis Santa   Encounter Date: 08/03/2020   PT End of Session - 08/03/20 1439    Visit Number 8    Number of Visits 25    Date for PT Re-Evaluation 09/28/20    PT Start Time 6962    PT Stop Time 1515    PT Time Calculation (min) 38 min    Activity Tolerance Patient tolerated treatment well    Behavior During Therapy [facility] for tasks assessed/performed           Past Medical History:  Diagnosis Date  . Uterine fibroid     Past Surgical History:  Procedure Laterality Date  . ablation    . APPENDECTOMY    . DILATION AND CURETTAGE OF UTERUS     x3 for miscarriage  . LAPAROSCOPY     Fibroid removal    There were no vitals filed for this visit.   Subjective Assessment - 08/03/20 1437    Subjective Pt reports that she is doing well today however she ate some meat and didn't feel well on Tuesday. She reports some occasional L hip pain recently but not currently.  No specific questions or concerns.    Pertinent History fibromyalgia    Patient Stated Goals return to work, improve strength and mobility, decrease pain    Currently in Pain? No/denies    Pain Location --    Pain Orientation --    Pain Descriptors / Indicators --    Pain Type --    Pain Onset --    Pain Frequency --                TREATMENT   Manual Therapy Updated outcome measures and goals with patient: FOTO: 63 5TSTS: 11.5s Strength Testing:  R/L 4+/4+ Hip flexion 5/5 Hip external rotation 5/5 Hip internal rotation 4/4 Hip extension  4+/4+ Hip abduction 4+/4 Hip adduction 5/5 Knee extension 4+/4+ Knee flexion 5/5 Ankle Dorsiflexion *indicates pain   Prone STM tolumbar region bilaterally  paraspinals, quadratus lumborum, glutes (max/med/min) and deep hip external rotators including piriformis.Also utilized percussive STM with Hypervolt; Grade I-II CPA grade mobs L1-L5, 20s/bout x2bouts/level; Grade I-II UPA grade mobs L1-L5 bilateral, 20s/bout x2bouts/level;   Ther-ex Prone straight and bent knee hip extension x 10 each bilaterally;   Pt educated throughout session about proper posture and technique with exercises. Improved exercise technique, movement at target joints, use of target muscles after min to mod verbal, visual, tactile cues.   Patient demonstrates excellent motivation during session today.  She has made great progress with therapy and overall reports significant improvement in her pain as well as her endurance with activities around the house such as cleaning and vacuuming.  Her FOTO score has improved from 35 to 63 and her 5 times sit to stand has dropped from 19.4 seconds to 11.5 seconds.  She continues to report limitations with respect to endurance and pain.  She also continues to demonstrate some weakness with lateral hips as well as abdominal and low back.  Continued with prone soft tissue mobilization and lumbar mobilizations today. In future sessions will continue with additional stabilization strengthening for abdominals, hips, and low back.Patient encouraged to continue HEP, monitor for response to treatment today, and follow-up as scheduled.  She has yet to achieve maximal benefit from physical therapy and pt will benefit from additional PT services to address deficits in back pain and improve pain-free function at home.                              PT Long Term Goals - 08/03/20 1445      PT LONG TERM GOAL #1   Title Pt. will perform 5xSTS in 12 seconds or less to demonstrate improvements in LE strength and functional mobility.    Baseline 11/16: 19.4s; 08/03/20: 11.5s    Time 8    Period Weeks    Status Achieved       PT LONG TERM GOAL #2   Title Pt. will report ability to vaccuum for greater than 10 mins with no increase in back pain over 2/10 to improve pain free functional mobility.    Baseline 11/16: pt is currently not able to vaccuum for 4-5 minutes with back pain at 5/10; 08/03/20: Pt can vaccuum for 4-5 minutes, no pain but fatigue, rest for a minute and continue.    Time 8    Period Weeks    Status Partially Met    Target Date 09/28/20      PT LONG TERM GOAL #3   Title Pt. will improve FOTO to 50 to demonstrate improvement in pain free functional mobility.    Baseline 11/16: 35; 08/03/20: 63    Time 4    Period Weeks    Status Achieved      PT LONG TERM GOAL #4   Title Pt. independent with HEP to increase B hip strength 1/2 muscle grade to improve standing tolerance/ pain-free household tasks.    Baseline Pt. demonstrates 5/5 strength bilaterally, except for bilat hip IR, ER, and flexion which scored 4/5 for bilat for each. 08/03/20: R/L Hip flexion: 4+/4+, hip extension: 4/4, hip abduction: 4+/4+, hip adduction: 4+/4    Time 8    Period Weeks    Status Partially Met    Target Date 09/28/20                 Plan - 08/04/20 1327    Clinical Impression Statement Patient demonstrates excellent motivation during session today.  She has made great progress with therapy and overall reports significant improvement in her pain as well as her endurance with activities around the house such as cleaning and vacuuming.  Her FOTO score has improved from 35 to 63 and her 5 times sit to stand has dropped from 19.4 seconds to 11.5 seconds.  She continues to report limitations with respect to endurance and pain.  She also continues to demonstrate some weakness with lateral hips as well as abdominal and low back.  Continued with prone soft tissue mobilization and lumbar mobilizations today.  In future sessions will continue with additional stabilization strengthening for abdominals, hips, and low  back. Patient encouraged to continue HEP, monitor for response to treatment today, and follow-up as scheduled.    She has yet to achieve maximal benefit from physical therapy and pt will benefit from additional PT services to address deficits in back pain and improve pain-free function at home.    Personal Factors and Comorbidities Fitness;Past/Current Experience;Time since onset of injury/illness/exacerbation    Examination-Activity Limitations Bed Mobility;Lift;Stairs;Squat;Stand;Transfers;Sit    Examination-Participation Restrictions Community Activity;Yard Work    Stability/Clinical Decision Making Stable/Uncomplicated    Rehab Potential Fair    PT  Frequency 2x / week    PT Duration 8 weeks    PT Treatment/Interventions ADLs/Self Care Home Management;Aquatic Therapy;Cryotherapy;Gait training;Stair training;Moist Heat;Functional mobility training;Therapeutic activities;Therapeutic exercise;Balance training;Neuromuscular re-education;Patient/family education;Manual techniques;Dry needling;Electrical Stimulation;Spinal Manipulations;Joint Manipulations    PT Next Visit Plan Update outcome measures, goals, recertification, prone manual tx, TDN, consider pain education as well as incorporating mindfulness into sessions.    PT Home Exercise Plan [email or web address]    Consulted and Agree with Plan of Care Patient           Patient will benefit from skilled therapeutic intervention in order to improve the following deficits and impairments:  Abnormal gait,Decreased activity tolerance,Decreased endurance,Decreased mobility,Decreased range of motion,Hypomobility,Difficulty walking,Decreased strength,Increased muscle spasms,Impaired perceived functional ability,Pain  Visit Diagnosis: Chronic bilateral low back pain, unspecified whether sciatica present  Decreased strength     Problem List Patient Active Problem List   Diagnosis Date Noted  . History of palpitations 07/05/2020  . Shortness of breath  07/05/2020  . Chronic bilateral low back pain without sciatica 06/06/2020  . Lumbar facet arthropathy 06/06/2020  . Myofascial pain 06/06/2020  . Chronic pain syndrome 06/06/2020  . Cannabis use disorder, mild, abuse 06/06/2020  . Fibromyalgia 06/06/2020  . Elevated lipids 03/09/2020  . Encounter to establish care 02/22/2020  . Chronic abdominal pain 02/22/2020  . Hot flashes 02/22/2020  . Chronic back pain 02/22/2020  . Anxiety 02/22/2020   Phillips Grout PT, DPT, GCS  Amber Livingston 08/04/2020, 1:35 PM  [place] [facility] [address]. [place], [place], [address] Phone: [phone]   Fax:  [phone]  Name: Amber Livingston MRN: [id number] Date of Birth: 01-Feb-1961

## 2020-08-07 ENCOUNTER — Ambulatory Visit
Payer: No Typology Code available for payment source | Admitting: Student in an Organized Health Care Education/Training Program

## 2020-08-08 ENCOUNTER — Other Ambulatory Visit: Payer: Self-pay

## 2020-08-08 ENCOUNTER — Ambulatory Visit: Payer: No Typology Code available for payment source

## 2020-08-08 DIAGNOSIS — G8929 Other chronic pain: Secondary | ICD-10-CM

## 2020-08-08 DIAGNOSIS — R2689 Other abnormalities of gait and mobility: Secondary | ICD-10-CM

## 2020-08-08 DIAGNOSIS — R531 Weakness: Secondary | ICD-10-CM

## 2020-08-08 NOTE — Therapy (Signed)
[facility] [address] [place], [place], [address] Phone: [phone]   Fax:  [phone]  Physical Therapy Treatment  Patient Details  Name: Amber Livingston MRN: [id number] Date of Birth: [birth year] Referring Provider (PT): Dr. Gillis Santa   Encounter Date: 08/08/2020   PT End of Session - 08/08/20 1433    Visit Number 9    Number of Visits 25    Date for PT Re-Evaluation 09/28/20    PT Start Time 1433    PT Stop Time 1515    PT Time Calculation (min) 42 min    Activity Tolerance Patient tolerated treatment well    Behavior During Therapy [facility] for tasks assessed/performed           Past Medical History:  Diagnosis Date  . Uterine fibroid     Past Surgical History:  Procedure Laterality Date  . ablation    . APPENDECTOMY    . DILATION AND CURETTAGE OF UTERUS     x3 for miscarriage  . LAPAROSCOPY     Fibroid removal    There were no vitals filed for this visit.   Subjective Assessment - 08/08/20 1504    Subjective Pt reports that she is doing well today. She had a couple rough days after her last therapy session but then felt really good. No specific questions or concerns.    Pertinent History fibromyalgia    Patient Stated Goals return to work, improve strength and mobility, decrease pain    Currently in Pain? No/denies              TREATMENT   Manual Therapy Moist heat to lumbar spine during interval history x 5 minutes; Prone STM to lumbar region bilaterally paraspinals and quadratus lumborum, utilizing theraband roller for glut. Max/med/piriformis muscles. Also utilized percussive STM with Hypervolt; Grade I-II CPA grade mobs L1-L5, 20s/bout x2bouts/level; Grade I-II UPA grade mobs L1-L5 bilateral, 20s/bout x2bouts/level;   Ther-ex  Prone straight and bent knee hip extension x 10 each bilaterally; Prone HS curls with manual resistance from therapist x 10 bilaterally; Side-lying hip  abduction with manual resistance from therapist x10 BLE; Side-lying clams with manual resistance from therapist x10 BLE; Side-lying reverse clams with manual resistance from therapist x10 BLE; Side knee planks 10s hold/10s relax x 3 bilateral; Quadruped cat/camel x 10 each direction; Quadruped bird dogs x 5, tactile cues from therapist for core stability; Child's pose stretch 10-second hold x2   Pt educated throughout session about proper posture and technique with exercises. Improved exercise technique, movement at target joints, use of target muscles after min to mod verbal, visual, tactile cues.    Continued with prone soft tissue mobilization and lumbar mobilizations during session today. Incorporated additional stabilization strengthening for abdominals, hips, and low back today.  She demonstrates improving stability during bird dogs. Patient encouraged to continue HEP, monitor for response to treatment today, and follow-up as scheduled.  She will need a progress note at next visit.  Pt will benefit from PT services to address deficits in back pain and improve pain-free function at home.                                 PT Long Term Goals - 08/03/20 1445      PT LONG TERM GOAL #1   Title Pt. will perform 5xSTS in 12 seconds or less to demonstrate improvements in LE strength  and functional mobility.    Baseline 11/16: 19.4s; 08/03/20: 11.5s    Time 8    Period Weeks    Status Achieved      PT LONG TERM GOAL #2   Title Pt. will report ability to vaccuum for greater than 10 mins with no increase in back pain over 2/10 to improve pain free functional mobility.    Baseline 11/16: pt is currently not able to vaccuum for 4-5 minutes with back pain at 5/10; 08/03/20: Pt can vaccuum for 4-5 minutes, no pain but fatigue, rest for a minute and continue.    Time 8    Period Weeks    Status Partially Met    Target Date 09/28/20      PT LONG TERM GOAL #3    Title Pt. will improve FOTO to 50 to demonstrate improvement in pain free functional mobility.    Baseline 11/16: 35; 08/03/20: 63    Time 4    Period Weeks    Status Achieved      PT LONG TERM GOAL #4   Title Pt. independent with HEP to increase B hip strength 1/2 muscle grade to improve standing tolerance/ pain-free household tasks.    Baseline Pt. demonstrates 5/5 strength bilaterally, except for bilat hip IR, ER, and flexion which scored 4/5 for bilat for each. 08/03/20: R/L Hip flexion: 4+/4+, hip extension: 4/4, hip abduction: 4+/4+, hip adduction: 4+/4    Time 8    Period Weeks    Status Partially Met    Target Date 09/28/20                 Plan - 08/08/20 1433    Clinical Impression Statement Continued with prone soft tissue mobilization and lumbar mobilizations during session today. Incorporated additional stabilization strengthening for abdominals, hips, and low back today.  She demonstrates improving stability during bird dogs.  Patient encouraged to continue HEP, monitor for response to treatment today, and follow-up as scheduled.  She will need a progress note at next visit.  Pt will benefit from PT services to address deficits in back pain and improve pain-free function at home.    Personal Factors and Comorbidities Fitness;Past/Current Experience;Time since onset of injury/illness/exacerbation    Examination-Activity Limitations Bed Mobility;Lift;Stairs;Squat;Stand;Transfers;Sit    Examination-Participation Restrictions Community Activity;Yard Work    Stability/Clinical Decision Making Stable/Uncomplicated    Rehab Potential Fair    PT Frequency 2x / week    PT Duration 8 weeks    PT Treatment/Interventions ADLs/Self Care Home Management;Aquatic Therapy;Cryotherapy;Gait training;Stair training;Moist Heat;Functional mobility training;Therapeutic activities;Therapeutic exercise;Balance training;Neuromuscular re-education;Patient/family education;Manual techniques;Dry  needling;Electrical Stimulation;Spinal Manipulations;Joint Manipulations    PT Next Visit Plan Progress note (no need to update outcome measures), prone manual tx, adominal and low back strengthening;    PT Home Exercise Plan [email or web address]    Consulted and Agree with Plan of Care Patient           Patient will benefit from skilled therapeutic intervention in order to improve the following deficits and impairments:  Abnormal gait,Decreased activity tolerance,Decreased endurance,Decreased mobility,Decreased range of motion,Hypomobility,Difficulty walking,Decreased strength,Increased muscle spasms,Impaired perceived functional ability,Pain  Visit Diagnosis: Chronic bilateral low back pain, unspecified whether sciatica present  Decreased strength  Decreased mobility     Problem List Patient Active Problem List   Diagnosis Date Noted  . History of palpitations 07/05/2020  . Shortness of breath 07/05/2020  . Chronic bilateral low back pain without sciatica 06/06/2020  . Lumbar facet arthropathy 06/06/2020  . Myofascial pain  06/06/2020  . Chronic pain syndrome 06/06/2020  . Cannabis use disorder, mild, abuse 06/06/2020  . Fibromyalgia 06/06/2020  . Elevated lipids 03/09/2020  . Encounter to establish care 02/22/2020  . Chronic abdominal pain 02/22/2020  . Hot flashes 02/22/2020  . Chronic back pain 02/22/2020  . Anxiety 02/22/2020   Phillips Grout PT, DPT, GCS  Kortni Hasten 08/08/2020, 4:17 PM  [place] [facility] [address] [place], [place], [address] Phone: [phone]   Fax:  [phone]  Name: Amber Livingston MRN: [id number] Date of Birth: 1961/08/12

## 2020-08-10 ENCOUNTER — Ambulatory Visit: Payer: No Typology Code available for payment source

## 2020-08-10 ENCOUNTER — Other Ambulatory Visit: Payer: Self-pay

## 2020-08-10 DIAGNOSIS — G8929 Other chronic pain: Secondary | ICD-10-CM

## 2020-08-10 DIAGNOSIS — R531 Weakness: Secondary | ICD-10-CM

## 2020-08-10 NOTE — Therapy (Signed)
[facility] [address] [place], [place], [address] Phone: [phone]   Fax:  [phone]  Physical Therapy Progress Note  Dates of reporting period  07/04/20   to   08/10/20  Patient Details  Name: Amber Livingston MRN: [id number] Date of Birth: 13-Feb-1961 Referring Provider (PT): Dr. Gillis Santa   Encounter Date: 08/10/2020   PT End of Session - 08/10/20 1628    Visit Number 10    Number of Visits 25    Date for PT Re-Evaluation 09/28/20    PT Start Time 1600    PT Stop Time 1645    PT Time Calculation (min) 45 min    Activity Tolerance Patient tolerated treatment well    Behavior During Therapy [facility] for tasks assessed/performed           Past Medical History:  Diagnosis Date  . Uterine fibroid     Past Surgical History:  Procedure Laterality Date  . ablation    . APPENDECTOMY    . DILATION AND CURETTAGE OF UTERUS     x3 for miscarriage  . LAPAROSCOPY     Fibroid removal    There were no vitals filed for this visit.   Subjective Assessment - 08/10/20 1627    Subjective Pt reports that she is doing well today.  Denies low back pain or hip pain upon arrival.  Overall very pleased with the progress she has made since starting therapy. No specific questions or concerns.    Pertinent History fibromyalgia    Patient Stated Goals return to work, improve strength and mobility, decrease pain    Currently in Pain? No/denies                TREATMENT   Manual Therapy Moist heat to lumbar spine during interval history x 5 minutes; Prone STM tolumbar region bilaterally paraspinals and quadratus lumborum, utilizing theraband roller for glut. Max/med/piriformis muscles.Also utilized percussive STM with Hypervolt; Grade I-II CPA grade mobs L1-L5, 20s/bout x2bouts/level; Grade I-II UPA grade mobs L1-L5 bilateral, 20s/bout x2bouts/level;   Ther-ex Prone straight and bent knee hip extension x 10 each  bilaterally; Prone HS curls with manual resistance from therapist x 10 bilaterally; Side-lying hip abduction with manual resistance from therapist x10 BLE; Side-lying clams with manual resistance from therapist x10 BLE; Side-lying reverse clams with manual resistance from therapist x10 BLE; Side knee planks 10s hold/10s relax x 3 bilateral; Quadruped cat/camel x 10 each direction; Quadruped bird dogs x 5, tactile cues from therapist for core stability; Child's pose stretch 10-second hold x2   Pt educated throughout session about proper posture and technique with exercises. Improved exercise technique, movement at target joints, use of target muscles after min to mod verbal, visual, tactile cues.   Patient demonstrates excellent motivation during session today.  She has made great progress with therapy and overall reports significant improvement in her pain as well as her endurance with activities around the house such as cleaning and vacuuming.  Outcome measures recently updated with patient so deferred today.  When last scored her FOTO score had improved from 35 to 63 and her 5 times sit to stand had dropped from 19.4 seconds to 11.5 seconds.  She continues to report limitations with respect to endurance and pain.  She also continues to demonstrate some weakness with lateral hips as well as abdominal and low back however this is improving with each passing session.  Continued with prone soft tissue mobilization and lumbar  mobilizations today. In future sessions will continue with additional stabilization strengthening for abdominals,hips,andlow back.Patient encouraged to continue HEP, monitor for response to treatment today, and follow-up as scheduled.  She has yet to achieve maximal benefit from physical therapy and pt will benefit from additional PT services to address deficits in back pain and improve pain-free function at home.                                PT Long Term Goals - 08/10/20 1630      PT LONG TERM GOAL #1   Title Pt. will perform 5xSTS in 12 seconds or less to demonstrate improvements in LE strength and functional mobility.    Baseline 11/16: 19.4s; 08/03/20: 11.5s    Time 8    Period Weeks    Status Achieved      PT LONG TERM GOAL #2   Title Pt. will report ability to vaccuum for greater than 10 mins with no increase in back pain over 2/10 to improve pain free functional mobility.    Baseline 11/16: pt is currently not able to vaccuum for 4-5 minutes with back pain at 5/10; 08/03/20: Pt can vaccuum for 4-5 minutes, no pain but fatigue, rest for a minute and continue.    Time 8    Period Weeks    Status Partially Met    Target Date 09/28/20      PT LONG TERM GOAL #3   Title Pt. will improve FOTO to 50 to demonstrate improvement in pain free functional mobility.    Baseline 11/16: 35; 08/03/20: 63    Time 4    Period Weeks    Status Achieved      PT LONG TERM GOAL #4   Title Pt. independent with HEP to increase B hip strength 1/2 muscle grade to improve standing tolerance/ pain-free household tasks.    Baseline Pt. demonstrates 5/5 strength bilaterally, except for bilat hip IR, ER, and flexion which scored 4/5 for bilat for each. 08/03/20: R/L Hip flexion: 4+/4+, hip extension: 4/4, hip abduction: 4+/4+, hip adduction: 4+/4    Time 8    Period Weeks    Status Partially Met    Target Date 09/28/20                 Plan - 08/10/20 1630    Clinical Impression Statement Patient demonstrates excellent motivation during session today.  She has made great progress with therapy and overall reports significant improvement in her pain as well as her endurance with activities around the house such as cleaning and vacuuming.  Outcome measures recently updated with patient so deferred today.  When last scored her FOTO score had improved from 35 to 63 and her 5 times sit  to stand had dropped from 19.4 seconds to 11.5 seconds.  She continues to report limitations with respect to endurance and pain.  She also continues to demonstrate some weakness with lateral hips as well as abdominal and low back however this is improving with each passing session.  Continued with prone soft tissue mobilization and lumbar mobilizations today.  In future sessions will continue with additional stabilization strengthening for abdominals, hips, and low back. Patient encouraged to continue HEP, monitor for response to treatment today, and follow-up as scheduled.    She has yet to achieve maximal benefit from physical therapy and pt will benefit from additional PT services to address deficits in back pain  and improve pain-free function at home.    Personal Factors and Comorbidities Fitness;Past/Current Experience;Time since onset of injury/illness/exacerbation    Examination-Activity Limitations Bed Mobility;Lift;Stairs;Squat;Stand;Transfers;Sit    Examination-Participation Restrictions Community Activity;Yard Work    Stability/Clinical Decision Making Stable/Uncomplicated    Rehab Potential Fair    PT Frequency 2x / week    PT Duration 8 weeks    PT Treatment/Interventions ADLs/Self Care Home Management;Aquatic Therapy;Cryotherapy;Gait training;Stair training;Moist Heat;Functional mobility training;Therapeutic activities;Therapeutic exercise;Balance training;Neuromuscular re-education;Patient/family education;Manual techniques;Dry needling;Electrical Stimulation;Spinal Manipulations;Joint Manipulations    PT Next Visit Plan prone manual tx, adominal and low back strengthening;    PT Home Exercise Plan [email or web address]    Consulted and Agree with Plan of Care Patient           Patient will benefit from skilled therapeutic intervention in order to improve the following deficits and impairments:  Abnormal gait,Decreased activity tolerance,Decreased endurance,Decreased mobility,Decreased range of  motion,Hypomobility,Difficulty walking,Decreased strength,Increased muscle spasms,Impaired perceived functional ability,Pain  Visit Diagnosis: Chronic bilateral low back pain, unspecified whether sciatica present  Decreased strength     Problem List Patient Active Problem List   Diagnosis Date Noted  . History of palpitations 07/05/2020  . Shortness of breath 07/05/2020  . Chronic bilateral low back pain without sciatica 06/06/2020  . Lumbar facet arthropathy 06/06/2020  . Myofascial pain 06/06/2020  . Chronic pain syndrome 06/06/2020  . Cannabis use disorder, mild, abuse 06/06/2020  . Fibromyalgia 06/06/2020  . Elevated lipids 03/09/2020  . Encounter to establish care 02/22/2020  . Chronic abdominal pain 02/22/2020  . Hot flashes 02/22/2020  . Chronic back pain 02/22/2020  . Anxiety 02/22/2020   Phillips Grout PT, DPT, GCS  Elexis Pollak 08/10/2020, 4:33 PM  [facility] [address] [place], [place], [address] Phone: [phone]   Fax:  [phone]  Name: Amber Livingston MRN: [id number] Date of Birth: 1960/12/18

## 2020-08-21 NOTE — Patient Instructions (Incomplete)
TREATMENT   Manual Therapy Moist heat to lumbar spine during interval history x 5 minutes; Prone STM tolumbar region bilaterally paraspinals and quadratus lumborum,utilizingtheraband roller for glut. Max/med/piriformis muscles.Also utilized percussive STM with Hypervolt; Grade I-II CPA grade mobs L1-L5, 20s/bout x2bouts/level; Grade I-II UPA grade mobs L1-L5 bilateral, 20s/bout x2bouts/level;   Ther-ex Prone straight and bent knee hip extension x 10 each bilaterally; Prone HS curls with manual resistance from therapist x 10 bilaterally; Side-lying hip abduction with manual resistance from therapist x10 BLE; Side-lying clams with manual resistance from therapist x10 BLE; Side-lyingreverseclams with manual resistance from therapist x10 BLE; Side knee planks 10s hold/10s relax x 3 bilateral; Quadruped cat/camel x 10 each direction; Quadruped bird dogs x 5, tactile cues from therapist for core stability; Child's pose stretch 10-second hold x2   Pt educated throughout session about proper posture and technique with exercises. Improved exercise technique, movement at target joints, use of target muscles after min to mod verbal, visual, tactile cues.   Patient demonstrates excellent motivation during session today. She has made great progress with therapy and overall reports significant improvement in her pain as well as her endurance with activities around the house such as cleaning and vacuuming.  Outcome measures recently updated with patient so deferred today.  When last scored her FOTO score had improved from 35 to63 and her 5 times sit to stand had dropped from 19.4 seconds to 11.5 seconds. She continues to report limitations with respect to endurance and pain. She also continues to demonstrate some weakness withlateral hips as well as abdominal and low back however this is improving with each passing session. Continued with prone soft tissue mobilization and  lumbar mobilizationstoday.In future sessions will continue with additional stabilization strengthening for abdominals,hips,andlow back.Patient encouraged to continue HEP, monitor for response to treatment today, and follow-up as scheduled.She has yet to achieve maximal benefit from physical therapy and pt will benefit fromadditionalPT services to address deficits in back pain and improve pain-free function at home.

## 2020-08-22 ENCOUNTER — Other Ambulatory Visit: Payer: Self-pay

## 2020-08-22 ENCOUNTER — Ambulatory Visit
Payer: No Typology Code available for payment source | Attending: Student in an Organized Health Care Education/Training Program

## 2020-08-22 DIAGNOSIS — G8929 Other chronic pain: Secondary | ICD-10-CM | POA: Insufficient documentation

## 2020-08-22 DIAGNOSIS — M545 Low back pain, unspecified: Secondary | ICD-10-CM | POA: Insufficient documentation

## 2020-08-22 DIAGNOSIS — R2689 Other abnormalities of gait and mobility: Secondary | ICD-10-CM | POA: Insufficient documentation

## 2020-08-22 DIAGNOSIS — R531 Weakness: Secondary | ICD-10-CM | POA: Insufficient documentation

## 2020-08-22 NOTE — Therapy (Signed)
[place] [facility] [address] [place], [place], [address] Phone: [phone]   Fax:  [phone]  Physical Therapy Treatment  Patient Details  Name: Amber Livingston MRN: [id number] Date of Birth: Jan 05, 1961 Referring Provider (PT): Dr. Gillis Santa   Encounter Date: 08/22/2020   PT End of Session - 08/22/20 1434    Visit Number 11    Number of Visits 25    Date for PT Re-Evaluation 09/28/20    PT Start Time 1432    PT Stop Time 1515    PT Time Calculation (min) 43 min    Activity Tolerance Patient tolerated treatment well    Behavior During Therapy [facility] for tasks assessed/performed           Past Medical History:  Diagnosis Date  . Uterine fibroid     Past Surgical History:  Procedure Laterality Date  . ablation    . APPENDECTOMY    . DILATION AND CURETTAGE OF UTERUS     x3 for miscarriage  . LAPAROSCOPY     Fibroid removal    There were no vitals filed for this visit.   Subjective Assessment - 08/22/20 1433    Subjective Pt reports that she is doing well today.  Denies low back pain or hip pain upon arrival.  Continues to make improvement and remain pleased with her progress. No specific questions or concerns.    Pertinent History fibromyalgia    Patient Stated Goals return to work, improve strength and mobility, decrease pain    Currently in Pain? No/denies              TREATMENT   Manual Therapy Moist heat to lumbar spine during interval history x 5 minutes; Prone STM tolumbar region bilaterally paraspinals and quadratus lumborum, utilizing theraband roller for glut. Max/med/piriformis muscles.Also utilized percussive STM with Hypervolt; Grade I-II CPA grade mobs L1-L5, 20s/bout x2bouts/level; Grade I-II UPA grade mobs L1-L5 bilateral, 20s/bout x2bouts/level;   Ther-ex Prone straight and bent knee hip extension x 10 each bilaterally; Prone HS curls with manual resistance from therapist x 10  bilaterally; Quadruped cat/camel x 10 each direction; Quadruped bird dogs x 5, tactile cues from therapist for core stability; Child's pose stretch 10-second hold x2 with overpressure into flexion by therapist; Hooklying single leg bridges x 10 LLE, x 20 LLE; Hooklying straight knee physioball bridges x 10; Total Gym single leg squats L22 2 x 10 BLE;   Pt educated throughout session about proper posture and technique with exercises. Improved exercise technique, movement at target joints, use of target muscles after min to mod verbal, visual, tactile cues.   Patient demonstrates excellent motivation during session today. She is making great progress with therapy and demonstrates improved hip and trunk strength/control during session. Continued with prone soft tissue mobilization and lumbar mobilizations today.Incorporated physioball into exercises today as well as Total Gym to progress to more weightbearing exercises. In future sessions will continue with additional stabilization strengthening for abdominals,hips,andlow back.Patient encouraged to continue HEP, monitor for response to treatment today, and follow-up as scheduled.  She has yet to achieve maximal benefit from physical therapy and pt will benefit from additional PT services to address deficits in back pain and improve pain-free function at home.                                  PT Long Term Goals - 08/10/20 1630  PT LONG TERM GOAL #1   Title Pt. will perform 5xSTS in 12 seconds or less to demonstrate improvements in LE strength and functional mobility.    Baseline 11/16: 19.4s; 08/03/20: 11.5s    Time 8    Period Weeks    Status Achieved      PT LONG TERM GOAL #2   Title Pt. will report ability to vaccuum for greater than 10 mins with no increase in back pain over 2/10 to improve pain free functional mobility.    Baseline 11/16: pt is currently not able to vaccuum for 4-5 minutes  with back pain at 5/10; 08/03/20: Pt can vaccuum for 4-5 minutes, no pain but fatigue, rest for a minute and continue.    Time 8    Period Weeks    Status Partially Met    Target Date 09/28/20      PT LONG TERM GOAL #3   Title Pt. will improve FOTO to 50 to demonstrate improvement in pain free functional mobility.    Baseline 11/16: 35; 08/03/20: 63    Time 4    Period Weeks    Status Achieved      PT LONG TERM GOAL #4   Title Pt. independent with HEP to increase B hip strength 1/2 muscle grade to improve standing tolerance/ pain-free household tasks.    Baseline Pt. demonstrates 5/5 strength bilaterally, except for bilat hip IR, ER, and flexion which scored 4/5 for bilat for each. 08/03/20: R/L Hip flexion: 4+/4+, hip extension: 4/4, hip abduction: 4+/4+, hip adduction: 4+/4    Time 8    Period Weeks    Status Partially Met    Target Date 09/28/20                 Plan - 08/22/20 1434    Clinical Impression Statement Patient demonstrates excellent motivation during session today. She is making great progress with therapy and demonstrates improved hip and trunk strength/control during session. Continued with prone soft tissue mobilization and lumbar mobilizations today. Incorporated physioball into exercises today as well as Total Gym to progress to more weightbearing exercises. In future sessions will continue with additional stabilization strengthening for abdominals, hips, and low back. Patient encouraged to continue HEP, monitor for response to treatment today, and follow-up as scheduled.    She has yet to achieve maximal benefit from physical therapy and pt will benefit from additional PT services to address deficits in back pain and improve pain-free function at home.    Personal Factors and Comorbidities Fitness;Past/Current Experience;Time since onset of injury/illness/exacerbation    Examination-Activity Limitations Bed Mobility;Lift;Stairs;Squat;Stand;Transfers;Sit     Examination-Participation Restrictions Community Activity;Yard Work    Stability/Clinical Decision Making Stable/Uncomplicated    Rehab Potential Fair    PT Frequency 2x / week    PT Duration 8 weeks    PT Treatment/Interventions ADLs/Self Care Home Management;Aquatic Therapy;Cryotherapy;Gait training;Stair training;Moist Heat;Functional mobility training;Therapeutic activities;Therapeutic exercise;Balance training;Neuromuscular re-education;Patient/family education;Manual techniques;Dry needling;Electrical Stimulation;Spinal Manipulations;Joint Manipulations    PT Next Visit Plan prone manual tx, adominal and low back strengthening;    PT Home Exercise Plan [email or web address]    Consulted and Agree with Plan of Care Patient           Patient will benefit from skilled therapeutic intervention in order to improve the following deficits and impairments:  Abnormal gait,Decreased activity tolerance,Decreased endurance,Decreased mobility,Decreased range of motion,Hypomobility,Difficulty walking,Decreased strength,Increased muscle spasms,Impaired perceived functional ability,Pain  Visit Diagnosis: Chronic bilateral low back pain, unspecified whether sciatica present  Decreased  strength     Problem List Patient Active Problem List   Diagnosis Date Noted  . History of palpitations 07/05/2020  . Shortness of breath 07/05/2020  . Chronic bilateral low back pain without sciatica 06/06/2020  . Lumbar facet arthropathy 06/06/2020  . Myofascial pain 06/06/2020  . Chronic pain syndrome 06/06/2020  . Cannabis use disorder, mild, abuse 06/06/2020  . Fibromyalgia 06/06/2020  . Elevated lipids 03/09/2020  . Encounter to establish care 02/22/2020  . Chronic abdominal pain 02/22/2020  . Hot flashes 02/22/2020  . Chronic back pain 02/22/2020  . Anxiety 02/22/2020   Phillips Grout PT, DPT, GCS  Kyree Fedorko 08/22/2020, 5:11 PM  [place] [facility] [address] [place], [place], [address] Phone: [phone]   Fax:  [phone]  Name: Amber Livingston MRN: [id number] Date of Birth: 04-13-1961

## 2020-08-24 ENCOUNTER — Ambulatory Visit: Payer: No Typology Code available for payment source

## 2020-08-25 ENCOUNTER — Other Ambulatory Visit: Payer: Self-pay

## 2020-08-25 ENCOUNTER — Encounter: Payer: Self-pay | Admitting: Internal Medicine

## 2020-08-25 ENCOUNTER — Ambulatory Visit (INDEPENDENT_AMBULATORY_CARE_PROVIDER_SITE_OTHER): Payer: No Typology Code available for payment source

## 2020-08-25 ENCOUNTER — Ambulatory Visit (INDEPENDENT_AMBULATORY_CARE_PROVIDER_SITE_OTHER): Payer: Self-pay | Admitting: Internal Medicine

## 2020-08-25 ENCOUNTER — Encounter: Payer: Self-pay | Admitting: *Deleted

## 2020-08-25 ENCOUNTER — Encounter: Payer: Self-pay | Admitting: Radiology

## 2020-08-25 VITALS — BP 112/80 | HR 64 | Ht 69.0 in | Wt 126.4 lb

## 2020-08-25 DIAGNOSIS — F1210 Cannabis abuse, uncomplicated: Secondary | ICD-10-CM

## 2020-08-25 DIAGNOSIS — R0600 Dyspnea, unspecified: Secondary | ICD-10-CM | POA: Insufficient documentation

## 2020-08-25 DIAGNOSIS — R002 Palpitations: Secondary | ICD-10-CM | POA: Insufficient documentation

## 2020-08-25 DIAGNOSIS — R0609 Other forms of dyspnea: Secondary | ICD-10-CM | POA: Insufficient documentation

## 2020-08-25 DIAGNOSIS — E782 Mixed hyperlipidemia: Secondary | ICD-10-CM

## 2020-08-25 NOTE — Progress Notes (Signed)
Enrolled patient for a 14 day Zio XT Monitor to be mailed to patients home. Patient was instructed to call [employer] for selfpay/hardship cost

## 2020-08-25 NOTE — Patient Instructions (Addendum)
Medication Instructions:  Your physician recommends that you continue on your current medications as directed. Please refer to the Current Medication list given to you today.  *If you need a refill on your cardiac medications before your next appointment, please call your pharmacy*   Lab Work: none If you have labs (blood work) drawn today and your tests are completely normal, you will receive your results only by: Marland Kitchen MyChart Message (if you have MyChart) OR . A paper copy in the mail If you have any lab test that is abnormal or we need to change your treatment, we will call you to review the results.   Testing/Procedures: Your physician has recommended that you wear a holter monitor. Holter monitors are medical devices that record the heart's electrical activity. Doctors most often use these monitors to diagnose arrhythmias. Arrhythmias are problems with the speed or rhythm of the heartbeat. The monitor is a small, portable device. You can wear one while you do your normal daily activities. This is usually used to diagnose what is causing palpitations/syncope (passing out).  14 day zio  Your physician has requested that you have an exercise tolerance test. For further information please visit [email or web address]. Please also follow instruction sheet, as given. You will need to have covid testing prior to stress test  Your physician has requested that you have an echocardiogram. Echocardiography is a painless test that uses sound waves to create images of your heart. It provides your doctor with information about the size and shape of your heart and how well your heart's chambers and valves are working. This procedure takes approximately one hour. There are no restrictions for this procedure.    Follow-Up: At [facility], you and your health needs are our priority.  As part of our continuing mission to provide you with exceptional heart care, we have created designated Provider Care Teams.   These Care Teams include your primary Cardiologist (physician) and Advanced Practice Providers (APPs -  Physician Assistants and Nurse Practitioners) who all work together to provide you with the care you need, when you need it.  We recommend signing up for the patient portal called "MyChart".  Sign up information is provided on this After Visit Summary.  MyChart is used to connect with patients for Virtual Visits (Telemedicine).  Patients are able to view lab/test results, encounter notes, upcoming appointments, etc.  Non-urgent messages can be sent to your provider as well.   To learn more about what you can do with MyChart, go to [email or web address].    Your next appointment:   2-3 month(s)  The format for your next appointment:   In Person  Provider:   Dr Gasper Sells   Other Instructions   ZIO XT- Long Term Monitor Instructions  14 Your physician has requested you wear your ZIO patch monitor_14______days.   This is a single patch monitor.  Ir[employer] supplies one patch monitor per enrollment.  Additional stickers are not available.   Please do not apply patch if you will be having a Nuclear Stress Test, Echocardiogram, Cardiac CT, MRI, or Chest Xray during the time frame you would be wearing the monitor. The patch cannot be worn during these tests.  You cannot remove and re-apply the ZIO XT patch monitor.   Your ZIO patch monitor will be sent USPS Priority mail from [facility] directly to your home address. The monitor may also be mailed to a PO BOX if home delivery is not available.   It may take 3-5 days  to receive your monitor after you have been enrolled.   Once you have received you monitor, please review enclosed instructions.  Your monitor has already been registered assigning a specific monitor serial # to you.   Applying the monitor   Shave hair from upper left chest.   Hold abrader disc by orange tab.  Rub abrader in 40 strokes over left upper chest as  indicated in your monitor instructions.   Clean area with 4 enclosed alcohol pads .  Use all pads to assure are is cleaned thoroughly.  Let dry.   Apply patch as indicated in monitor instructions.  Patch will be place under collarbone on left side of chest with arrow pointing upward.   Rub patch adhesive wings for 2 minutes.Remove white label marked "1".  Remove white label marked "2".  Rub patch adhesive wings for 2 additional minutes.   While looking in a mirror, press and release button in center of patch.  A small green light will flash 3-4 times .  This will be your only indicator the monitor has been turned on.     Do not shower for the first 24 hours.  You may shower after the first 24 hours.   Press button if you feel a symptom. You will hear a small click.  Record Date, Time and Symptom in the Patient Log Book.   When you are ready to remove patch, follow instructions on last 2 pages of Patient Log Book.  Stick patch monitor onto last page of Patient Log Book.   Place Patient Log Book in [place] box.  Use locking tab on box and tape box closed securely.  The [employer] and [employer] has [employer] on it.  Please place in mailbox as soon as possible.  Your physician should have your test results approximately 7 days after the monitor has been mailed back to [facility].   Call [place] at [phone] if you have questions regarding your ZIO XT patch monitor.  Call them immediately if you see an orange light blinking on your monitor.   If your monitor falls off in less than 4 days contact our Monitor department at [phone].  If your monitor becomes loose or falls off after 4 days call [employer] at [phone] for suggestions on securing your monitor.    Due to recent COVID-19 restrictions implemented by our local and state authorities and in an effort to keep both patients and staff as safe as possible, our hospital system requires COVID-19 testing prior to  certain scheduled hospital procedures.  Please go to [address]. [place], [place] [address]  .  This is a drive up testing site.  You will not need to exit your vehicle.  You will not be billed at the time of testing but may receive a bill later depending on your insurance. You must agree to self-quarantine from the time of your testing until the procedure date  This should included staying home with ONLY the people you live with.  Avoid take-out, grocery store shopping or leaving the house for any non-emergent reason.  Failure to have your COVID-19 test done on the date and time you have been scheduled will result in cancellation of your procedure.  Please call our office at [phone] if you have any questions.

## 2020-08-25 NOTE — Progress Notes (Signed)
Cardiology Office Note:    Date:  08/25/2020   ID:  Amber Livingston, DOB July 29, 1961, MRN [id number]  PCP:  Malachy Mood, MD  [facility] Cardiologist:  Werner Lean, MD  [place] Electrophysiologist:  None   CC: Shortness of breath Consulted for the evaluation of palpitations at the behest of Malachy Mood, MD  History of Present Illness:    Amber Livingston is a 60 y.o. female with a hx of Uterine Ablation, Cannabis Abuse, and HLD who presents for evaluation.  Patient notes that she is feeling much better after having a fibroid related back  She thought this was related to her back.  Now with her back has improved dramatically with her physical therapy.  Despite this, she has persistent shortness of breath.  She also has been having funny heart beats and spasms.  Has been using a pulse oximeter since COVID and her heart rate has been all over the place. Has chest aching pain.  This start  6 months ago and is getting worse.  Has palpitations at least twice a week. Discomfort occurs spontaneously, worsens with no cause, and improves with spontaneous.  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  No shortness of breath, but has DOE.  No PND or orthopnea.  No bendopnea, notes intentional weight loss, leg swelling , or abdominal swelling.  No syncope or near syncope .   Patient reports NO prior cardiac testing including  echo,  stress test,  heart catheterizations,  cardioversion,  ablations.  No history of pre-eclampsia or prematurity.  Notes distant drug use (cocaine).  Past Medical History:  Diagnosis Date  . Uterine fibroid     Past Surgical History:  Procedure Laterality Date  . ablation    . APPENDECTOMY    . DILATION AND CURETTAGE OF UTERUS     x3 for miscarriage  . LAPAROSCOPY     Fibroid removal  . PALPITATION      Current Medications: Current Meds  Medication Sig  . albuterol (VENTOLIN HFA) 108 (90 Base)  MCG/ACT inhaler Inhale 2 puffs into the lungs every 6 (six) hours as needed for wheezing or shortness of breath.  . BEE POLLEN PO Take 1 capsule by mouth daily.  . Cyanocobalamin (B-12) 2500 MCG TABS Take by mouth daily.  . diphenhydramine-acetaminophen (TYLENOL PM) 25-500 MG TABS tablet Take 2 tablets by mouth at bedtime as needed.  Marland Kitchen estradiol (ESTRACE) 0.5 MG tablet Take 0.5 mg by mouth daily.  Marland Kitchen MAGNESIUM BISGLYCINATE PO Take 1,000 mg by mouth daily.  . medroxyPROGESTERone (PROVERA) 2.5 MG tablet Take 2.5 mg by mouth daily.  . NON FORMULARY as needed. CBD gummies  . traZODone (DESYREL) 50 MG tablet Take 25 mg by mouth at bedtime.  . Turmeric (QC TUMERIC COMPLEX PO) Take 1,000 mg by mouth daily.     Allergies:   Patient has no known allergies.   Social History   Socioeconomic History  . Marital status: Married    Spouse name: Not on file  . Number of children: Not on file  . Years of education: Not on file  . Highest education level: Not on file  Occupational History  . Not on file  Tobacco Use  . Smoking status: Current Every Day Smoker    Packs/day: 0.25    Types: Cigarettes  . Smokeless tobacco: Never Used  Vaping Use  . Vaping Use: Never used  Substance and Sexual Activity  . Alcohol  use: Not Currently    Comment: Occ  . Drug use: Yes    Types: Marijuana  . Sexual activity: Yes    Partners: Male  Other Topics Concern  . Not on file  Social History Narrative  . Not on file   Social Determinants of Health   Financial Resource Strain: High Risk  . Difficulty of Paying Living Expenses: Very hard  Food Insecurity: Food Insecurity Present  . Worried About Charity fundraiser in the Last Year: Sometimes true  . Ran Out of Food in the Last Year: Never true  Transportation Needs: No Transportation Needs  . Lack of Transportation (Medical): No  . Lack of Transportation (Non-Medical): No  Physical Activity: Inactive  . Days of Exercise per Week: 0 days  . Minutes of  Exercise per Session: 0 min  Stress: Stress Concern Present  . Feeling of Stress : To some extent  Social Connections: Socially Isolated  . Frequency of Communication with Friends and Family: Twice a week  . Frequency of Social Gatherings with Friends and Family: Never  . Attends Religious Services: Never  . Active Member of Clubs or Organizations: No  . Attends Archivist Meetings: Never  . Marital Status: Never married     Family History: The patient's family history includes Brain cancer in her mother; Breast cancer (age of onset: 50) in her maternal grandmother and paternal grandmother; Lung cancer in her mother; Uterine cancer (age of onset: 32) in her mother.  ROS:   Please see the history of present illness.     All other systems reviewed and are negative.  EKGs/Labs/Other Studies Reviewed:    The following studies were reviewed today:  EKG:   08/25/20: SR 64 WNL 07/06/2020: Sinus 65 WNL  Recent Labs: 03/02/2020: ALT 23; BUN 13; Creatinine, Ser 0.74; Hemoglobin 13.1; Platelets 279; Potassium 4.1; Sodium 139; TSH 0.863  Recent Lipid Panel    Component Value Date/Time   CHOL 208 (H) 07/05/2020 1031   TRIG 89 07/05/2020 1031   HDL 60 07/05/2020 1031   CHOLHDL 3.5 07/05/2020 1031   LDLCALC 132 (H) 07/05/2020 1031   Risk Assessment/Calculations:     ASCVD Risk 4.2%  Physical Exam:    VS:  BP 112/80   Pulse 64   Ht 5\' 9"  (1.753 m)   Wt 126 lb 6.4 oz (57.3 kg)   SpO2 100%   BMI 18.67 kg/m     Wt Readings from Last 3 Encounters:  08/25/20 126 lb 6.4 oz (57.3 kg)  07/05/20 136 lb 8 oz (61.9 kg)  06/06/20 139 lb 6.4 oz (63.2 kg)     GEN:  Well nourished, well developed in no acute distress HEENT: Normal NECK: No JVD; No carotid bruits LYMPHATICS: No lymphadenopathy CARDIAC: RRR, no murmurs, rubs, gallops RESPIRATORY:  Clear to auscultation without rales, wheezing or rhonchi  ABDOMEN: Soft, non-tender, non-distended MUSCULOSKELETAL:  No edema; No  deformity  SKIN: Warm and dry NEUROLOGIC:  Alert and oriented x 3 PSYCHIATRIC:  Normal affect   ASSESSMENT:    1. DOE (dyspnea on exertion)   2. Palpitations   3. Mixed hyperlipidemia   4. Cannabis use disorder, mild, abuse    PLAN:    In order of problems listed above:  Palpitations; - will obtain 14-day non live heart monitor (ZioPatch)  DOE with chest spasm - The patient presents with possibly cardiac - EKG shows no without evidence of accessory pathway, ventricular pacing, digoxin use, LBBB, or baseline  ST changes. - Would recommend an echocardiogram to assess LVEF and exclude WMA.  - Would recommend exercise stress test (NPO at midnight); discussed risks, benefits, and alternatives of the diagnostic procedure including chest pain, arrhythmia, and death.  Patient amenable for testing.  Hyperlipidemia (mixed) - gave education on dietary changes  Cannabis Abuse, prior tobacco use, and distant substance abuse - discussed the dangers of cannabis use, both inhaled and oral, which include, but are not limited to cardiovascular disease, increased cancer risk of multiple types of cancer, COPD, peripheral arterial disease, strokes. - counseled on the benefits of smoking cessation. - firmly advised to quit.  - we also reviewed strategies to maximize success, including:  Removing cigarettes and smoking materials from environment   Stress management  Substitution of other forms of reinforcement (the one cigarette a day approach)  Support of family/friends and group smoking cessation  Selecting a quit date  Patient provided contact information for [employer] or 1-800-QUIT-NOW  Patient provided with [place]'s 8 free smoking cessation classes: [phone] and [email or web address].fr   2-3 months follow up unless new symptoms or abnormal test results warranting change in plan  Would be reasonable for  Virtual Follow up  Would be reasonable for  APP Follow  up   Shared Decision Making/Informed Consent The risks [chest pain, shortness of breath, cardiac arrhythmias, dizziness, blood pressure fluctuations, myocardial infarction, stroke/transient ischemic attack, and life-threatening complications (estimated to be 1 in 10,000)], benefits (risk stratification, diagnosing coronary artery disease, treatment guidance) and alternatives of an exercise tolerance test were discussed in detail with Ms. Sheffer and she agrees to proceed.  Medication Adjustments/Labs and Tests Ordered: Current medicines are reviewed at length with the patient today.  Concerns regarding medicines are outlined above.  Orders Placed This Encounter  Procedures  . EXERCISE TOLERANCE TEST (ETT)  . LONG TERM MONITOR (3-14 DAYS)  . EKG 12-Lead  . ECHOCARDIOGRAM COMPLETE   No orders of the defined types were placed in this encounter.   Patient Instructions  Medication Instructions:  Your physician recommends that you continue on your current medications as directed. Please refer to the Current Medication list given to you today.  *If you need a refill on your cardiac medications before your next appointment, please call your pharmacy*   Lab Work: none If you have labs (blood work) drawn today and your tests are completely normal, you will receive your results only by: Marland Kitchen MyChart Message (if you have MyChart) OR . A paper copy in the mail If you have any lab test that is abnormal or we need to change your treatment, we will call you to review the results.   Testing/Procedures: Your physician has recommended that you wear a holter monitor. Holter monitors are medical devices that record the heart's electrical activity. Doctors most often use these monitors to diagnose arrhythmias. Arrhythmias are problems with the speed or rhythm of the heartbeat. The monitor is a small, portable device. You can wear one while you do your normal daily activities. This is usually used to diagnose  what is causing palpitations/syncope (passing out).  14 day zio  Your physician has requested that you have an exercise tolerance test. For further information please visit HugeFiesta.[email or web address]. Please also follow instruction sheet, as given. You will need to have covid testing prior to stress test  Your physician has requested that you have an echocardiogram. Echocardiography is a painless test that uses sound waves to create images of your heart. It provides your doctor with  information about the size and shape of your heart and how well your heart's chambers and valves are working. This procedure takes approximately one hour. There are no restrictions for this procedure.    Follow-Up: At [facility], you and your health needs are our priority.  As part of our continuing mission to provide you with exceptional heart care, we have created designated Provider Care Teams.  These Care Teams include your primary Cardiologist (physician) and Advanced Practice Providers (APPs -  Physician Assistants and Nurse Practitioners) who all work together to provide you with the care you need, when you need it.  We recommend signing up for the patient portal called "MyChart".  Sign up information is provided on this After Visit Summary.  MyChart is used to connect with patients for Virtual Visits (Telemedicine).  Patients are able to view lab/test results, encounter notes, upcoming appointments, etc.  Non-urgent messages can be sent to your provider as well.   To learn more about what you can do with MyChart, go to [email or web address].    Your next appointment:   2-3 month(s)  The format for your next appointment:   In Person  Provider:   Dr Gasper Sells   Other Instructions   ZIO XT- Long Term Monitor Instructions  14 Your physician has requested you wear your ZIO patch monitor_14______days.   This is a single patch monitor.  [employer] supplies one patch monitor per enrollment.  Additional  stickers are not available.   Please do not apply patch if you will be having a Nuclear Stress Test, Echocardiogram, Cardiac CT, MRI, or Chest Xray during the time frame you would be wearing the monitor. The patch cannot be worn during these tests.  You cannot remove and re-apply the ZIO XT patch monitor.   Your ZIO patch monitor will be sent USPS Priority mail from [facility] directly to your home address. The monitor may also be mailed to a PO BOX if home delivery is not available.   It may take 3-5 days to receive your monitor after you have been enrolled.   Once you have received you monitor, please review enclosed instructions.  Your monitor has already been registered assigning a specific monitor serial # to you.   Applying the monitor   Shave hair from upper left chest.   Hold abrader disc by orange tab.  Rub abrader in 40 strokes over left upper chest as indicated in your monitor instructions.   Clean area with 4 enclosed alcohol pads .  Use all pads to assure are is cleaned thoroughly.  Let dry.   Apply patch as indicated in monitor instructions.  Patch will be place under collarbone on left side of chest with arrow pointing upward.   Rub patch adhesive wings for 2 minutes.Remove white label marked "1".  Remove white label marked "2".  Rub patch adhesive wings for 2 additional minutes.   While looking in a mirror, press and release button in center of patch.  A small green light will flash 3-4 times .  This will be your only indicator the monitor has been turned on.     Do not shower for the first 24 hours.  You may shower after the first 24 hours.   Press button if you feel a symptom. You will hear a small click.  Record Date, Time and Symptom in the Patient Log Book.   When you are ready to remove patch, follow instructions on last 2 pages of Patient Log Book.  Stick patch monitor onto last page of Patient Log Book.   Place Patient Log Book in [place] box.  Use locking  tab on box and tape box closed securely.  The [employer] has [employer] on it.  Please place in mailbox as soon as possible.  Your physician should have your test results approximately 7 days after the monitor has been mailed back to [facility].   Call [place] at [phone] if you have questions regarding your ZIO XT patch monitor.  Call them immediately if you see an orange light blinking on your monitor.   If your monitor falls off in less than 4 days contact our Monitor department at [phone].  If your monitor becomes loose or falls off after 4 days call [employer] at [phone] for suggestions on securing your monitor.    Due to recent COVID-19 restrictions implemented by our local and state authorities and in an effort to keep both patients and staff as safe as possible, our hospital system requires COVID-19 testing prior to certain scheduled hospital procedures.  Please go to [address]. [place], [place] [address]  .  This is a drive up testing site.  You will not need to exit your vehicle.  You will not be billed at the time of testing but may receive a bill later depending on your insurance. You must agree to self-quarantine from the time of your testing until the procedure date  This should included staying home with ONLY the people you live with.  Avoid take-out, grocery store shopping or leaving the house for any non-emergent reason.  Failure to have your COVID-19 test done on the date and time you have been scheduled will result in cancellation of your procedure.  Please call our office at [phone] if you have any questions.     Signed, Werner Lean, MD  08/25/2020 9:44 AM    [facility]

## 2020-08-29 ENCOUNTER — Ambulatory Visit: Payer: No Typology Code available for payment source

## 2020-08-29 ENCOUNTER — Other Ambulatory Visit: Payer: Self-pay

## 2020-08-29 DIAGNOSIS — M545 Low back pain, unspecified: Secondary | ICD-10-CM

## 2020-08-29 DIAGNOSIS — R531 Weakness: Secondary | ICD-10-CM

## 2020-08-29 NOTE — Therapy (Signed)
[facility] [address] [place], [place], [address] Phone: [phone]   Fax:  [phone]  Physical Therapy Treatment  Patient Details  Name: Amber Livingston MRN: [id number] Date of Birth: 08-21-1960 Referring Provider (PT): Dr. Edward Jolly   Encounter Date: 08/29/2020   PT End of Session - 08/29/20 1352    Visit Number 12    Number of Visits 25    Date for PT Re-Evaluation 09/28/20    PT Start Time 1347    PT Stop Time 1430    PT Time Calculation (min) 43 min    Activity Tolerance Patient tolerated treatment well    Behavior During Therapy [facility] for tasks assessed/performed           Past Medical History:  Diagnosis Date  . Uterine fibroid     Past Surgical History:  Procedure Laterality Date  . ablation    . APPENDECTOMY    . DILATION AND CURETTAGE OF UTERUS     x3 for miscarriage  . LAPAROSCOPY     Fibroid removal  . PALPITATION      There were no vitals filed for this visit.   Subjective Assessment - 08/29/20 1349    Subjective Pt reports that she is doing well today. Denies low back pain or hip pain upon arrival but did have pain this morning. Back is stiff upon arrival but not painful. No specific questions or concerns.    Pertinent History fibromyalgia    Patient Stated Goals return to work, improve strength and mobility, decrease pain    Currently in Pain? No/denies              TREATMENT   Manual Therapy Moist heat to lumbar spine during interval history x 5 minutes; Prone STM tolumbar region bilaterally paraspinals, quadratus lumborum, and glutMax/med/piriformis muscles utilizing percussive STM with Hypervolt; Grade I-II CPA grade mobs L1-L5, 20s/bout x2bouts/level; Grade I-II UPA grade mobs L1-L5 bilateral, 20s/bout x2bouts/level;   Ther-ex Prone straight and bent knee hip extension x 10 each bilaterally; Quadruped cat/camel x 10 each direction; Quadruped bird dogs x 10,  tactile cues from therapist for core stability; Quadruped fire hydrants x 10 BLE; Child's pose stretch 10-second hold x2 with overpressure into flexion by therapist, lateral bias; Knee front planks 15s hold, 15s relax x 5; Supine straight knee physioball bridges 1 x 10; Single leg 6" eccentric step-down 2 x 10 BLE, cues for proper technique and control with level pelvis and to avoid valgus at the knee;   Pt educated throughout session about proper posture and technique with exercises. Improved exercise technique, movement at target joints, use of target muscles after min to mod verbal, visual, tactile cues.   Patient demonstrates excellent motivation during session today. She is making great progress with therapy and demonstrates improved hip and trunk strength/control during session.Continued with prone soft tissue mobilization and lumbar mobilizationstoday.Incorporated planks as well as single-leg step downs into session today.  In future sessions will continue with additional stabilization strengthening for abdominals,hips,andlow back.Patient encouraged to continue HEP, monitor for response to treatment today, and follow-up as scheduled. She has yet to achieve maximal benefit from physical therapy and pt will benefit fromadditionalPT services to address deficits in back pain and improve pain-free function at home.                              PT Long Term Goals - 08/10/20 1630  PT LONG TERM GOAL #1   Title Pt. will perform 5xSTS in 12 seconds or less to demonstrate improvements in LE strength and functional mobility.    Baseline 11/16: 19.4s; 08/03/20: 11.5s    Time 8    Period Weeks    Status Achieved      PT LONG TERM GOAL #2   Title Pt. will report ability to vaccuum for greater than 10 mins with no increase in back pain over 2/10 to improve pain free functional mobility.    Baseline 11/16: pt is currently not able to vaccuum for 4-5 minutes  with back pain at 5/10; 08/03/20: Pt can vaccuum for 4-5 minutes, no pain but fatigue, rest for a minute and continue.    Time 8    Period Weeks    Status Partially Met    Target Date 09/28/20      PT LONG TERM GOAL #3   Title Pt. will improve FOTO to 50 to demonstrate improvement in pain free functional mobility.    Baseline 11/16: 35; 08/03/20: 63    Time 4    Period Weeks    Status Achieved      PT LONG TERM GOAL #4   Title Pt. independent with HEP to increase B hip strength 1/2 muscle grade to improve standing tolerance/ pain-free household tasks.    Baseline Pt. demonstrates 5/5 strength bilaterally, except for bilat hip IR, ER, and flexion which scored 4/5 for bilat for each. 08/03/20: R/L Hip flexion: 4+/4+, hip extension: 4/4, hip abduction: 4+/4+, hip adduction: 4+/4    Time 8    Period Weeks    Status Partially Met    Target Date 09/28/20                 Plan - 08/29/20 1353    Clinical Impression Statement Patient demonstrates excellent motivation during session today. She is making great progress with therapy and demonstrates improved hip and trunk strength/control during session. Continued with prone soft tissue mobilization and lumbar mobilizations today. Incorporated planks as well as single-leg step downs into session today.  In future sessions will continue with additional stabilization strengthening for abdominals, hips, and low back. Patient encouraged to continue HEP, monitor for response to treatment today, and follow-up as scheduled. She has yet to achieve maximal benefit from physical therapy and pt will benefit from additional PT services to address deficits in back pain and improve pain-free function at home.    Personal Factors and Comorbidities Fitness;Past/Current Experience;Time since onset of injury/illness/exacerbation    Examination-Activity Limitations Bed Mobility;Lift;Stairs;Squat;Stand;Transfers;Sit    Examination-Participation Restrictions  Community Activity;Yard Work    Stability/Clinical Decision Making Stable/Uncomplicated    Rehab Potential Fair    PT Frequency 2x / week    PT Duration 8 weeks    PT Treatment/Interventions ADLs/Self Care Home Management;Aquatic Therapy;Cryotherapy;Gait training;Stair training;Moist Heat;Functional mobility training;Therapeutic activities;Therapeutic exercise;Balance training;Neuromuscular re-education;Patient/family education;Manual techniques;Dry needling;Electrical Stimulation;Spinal Manipulations;Joint Manipulations    PT Next Visit Plan prone manual tx, adominal and low back strengthening;    PT Home Exercise Plan [email or web address]    Consulted and Agree with Plan of Care Patient           Patient will benefit from skilled therapeutic intervention in order to improve the following deficits and impairments:  Abnormal gait,Decreased activity tolerance,Decreased endurance,Decreased mobility,Decreased range of motion,Hypomobility,Difficulty walking,Decreased strength,Increased muscle spasms,Impaired perceived functional ability,Pain  Visit Diagnosis: Chronic bilateral low back pain, unspecified whether sciatica present  Decreased strength     Problem List  Patient Active Problem List   Diagnosis Date Noted  . Palpitations 08/25/2020  . DOE (dyspnea on exertion) 08/25/2020  . Mixed hyperlipidemia 08/25/2020  . History of palpitations 07/05/2020  . Shortness of breath 07/05/2020  . Chronic bilateral low back pain without sciatica 06/06/2020  . Lumbar facet arthropathy 06/06/2020  . Myofascial pain 06/06/2020  . Chronic pain syndrome 06/06/2020  . Cannabis use disorder, mild, abuse 06/06/2020  . Fibromyalgia 06/06/2020  . Elevated lipids 03/09/2020  . Encounter to establish care 02/22/2020  . Chronic abdominal pain 02/22/2020  . Hot flashes 02/22/2020  . Chronic back pain 02/22/2020  . Anxiety 02/22/2020   Phillips Grout PT, DPT, GCS  Joee Iovine 08/29/2020, 4:49 PM  [facility] [address]. [place], [place], [address] Phone: [phone]   Fax:  [phone]  Name: Amber Livingston MRN: [id number] Date of Birth: [birth year]

## 2020-08-30 ENCOUNTER — Ambulatory Visit: Payer: Self-pay | Admitting: Licensed Clinical Social Worker

## 2020-08-31 ENCOUNTER — Telehealth: Payer: Self-pay | Admitting: Gerontology

## 2020-08-31 ENCOUNTER — Ambulatory Visit: Payer: No Typology Code available for payment source

## 2020-08-31 ENCOUNTER — Other Ambulatory Visit: Payer: Self-pay

## 2020-08-31 DIAGNOSIS — G8929 Other chronic pain: Secondary | ICD-10-CM

## 2020-08-31 DIAGNOSIS — R531 Weakness: Secondary | ICD-10-CM

## 2020-08-31 NOTE — Therapy (Signed)
[facility] [address] [place], [place], [address] Phone: [phone]   Fax:  [phone]  Physical Therapy Treatment  Patient Details  Name: Amber Livingston MRN: [id number] Date of Birth: 02-15-1961 Referring Provider (PT): Dr. Gillis Santa   Encounter Date: 08/31/2020   PT End of Session - 08/31/20 1352    Visit Number 13    Number of Visits 25    Date for PT Re-Evaluation 09/28/20    PT Start Time 1347    PT Stop Time 1430    PT Time Calculation (min) 43 min    Activity Tolerance Patient tolerated treatment well    Behavior During Therapy Amber Livingston for tasks assessed/performed           Past Medical History:  Diagnosis Date  . Uterine fibroid     Past Surgical History:  Procedure Laterality Date  . ablation    . APPENDECTOMY    . DILATION AND CURETTAGE OF UTERUS     x3 for miscarriage  . LAPAROSCOPY     Fibroid removal  . PALPITATION      There were no vitals filed for this visit.   Subjective Assessment - 08/31/20 1351    Subjective Pt reports that she is doing well today. Denies low back pain or hip pain upon arrival. She reports that she was active all day yesterday without pain. She is fatigued today but is not having pain. No specific questions or concerns.    Pertinent History fibromyalgia    Patient Stated Goals return to work, improve strength and mobility, decrease pain    Currently in Pain? No/denies                  TREATMENT   Manual Therapy Moist heat to lumbar spine during interval history x 5 minutes; Prone STM tolumbar region bilateral paraspinals, quadratus lumborum, and glutMax/med/piriformis muscles utilizing percussive STM with Hypervolt; Grade I-II CPA grade mobs L1-L5, 20s/bout x2bouts/level; Grade I-II UPA grade mobs L1-L5 bilateral, 20s/bout x2bouts/level;   Ther-ex Prone straight and bent knee hip extension x 10 each bilaterally; Quadruped cat/camel x 10 each  direction; Quadruped bird dogs x 10, tactile cues from therapist for core stability; Quadruped fire hydrants x 10 BLE; Child's pose stretch 30 second hold x 2with overpressure into flexion by therapist, lateral bias also performed x30 seconds each direction with therapist providing distraction of the hips to help with lateral flexion stretch; Full front planks 10s hold, 10s relax x 2, increase in lumbar pain so regressed back to knee planks 15s hold, 15s relax x 3; Supine physioball hamstring curls x 10; Supine physioball single leg hip thrust x 10 BLE; Supine physioball dead bugs x10 on each side;   Pt educated throughout session about proper posture and technique with exercises. Improved exercise technique, movement at target joints, use of target muscles after min to mod verbal, visual, tactile cues.   Patient demonstrates excellent motivation during session today.She is making excellent progress with therapy and demonstrates improved hip and trunk strength/control during session.  Attempted to progress patient to full front planks however she does report increase in lower lumbar pain after second set so regressed back to knee planks.  Progressed stability exercises with physio ball including supine hamstring curls, supine single-leg hip thrust, and supine dead bugs. Continued with prone soft tissue mobilization and lumbar mobilizationstoday. In future sessions will continue with additional stabilization strengthening for abdominals,hips,andlow back.Patient encouraged to continue HEP, monitor for response  to treatment today, and follow-up as scheduled. She has yet to achieve maximal benefit from physical therapy and pt will benefit fromadditionalPT services to address deficits in back pain and improve pain-free function at home.                             PT Long Term Goals - 08/10/20 1630      PT LONG TERM GOAL #1   Title Pt. will perform 5xSTS in  12 seconds or less to demonstrate improvements in LE strength and functional mobility.    Baseline 11/16: 19.4s; 08/03/20: 11.5s    Time 8    Period Weeks    Status Achieved      PT LONG TERM GOAL #2   Title Pt. will report ability to vaccuum for greater than 10 mins with no increase in back pain over 2/10 to improve pain free functional mobility.    Baseline 11/16: pt is currently not able to vaccuum for 4-5 minutes with back pain at 5/10; 08/03/20: Pt can vaccuum for 4-5 minutes, no pain but fatigue, rest for a minute and continue.    Time 8    Period Weeks    Status Partially Met    Target Date 09/28/20      PT LONG TERM GOAL #3   Title Pt. will improve FOTO to 50 to demonstrate improvement in pain free functional mobility.    Baseline 11/16: 35; 08/03/20: 63    Time 4    Period Weeks    Status Achieved      PT LONG TERM GOAL #4   Title Pt. independent with HEP to increase B hip strength 1/2 muscle grade to improve standing tolerance/ pain-free household tasks.    Baseline Pt. demonstrates 5/5 strength bilaterally, except for bilat hip IR, ER, and flexion which scored 4/5 for bilat for each. 08/03/20: R/L Hip flexion: 4+/4+, hip extension: 4/4, hip abduction: 4+/4+, hip adduction: 4+/4    Time 8    Period Weeks    Status Partially Met    Target Date 09/28/20                 Plan - 08/31/20 1353    Clinical Impression Statement Patient demonstrates excellent motivation during session today. She is making excellent progress with therapy and demonstrates improved hip and trunk strength/control during session.  Attempted to progress patient to full front planks however she does report increase in lower lumbar pain after second set so regressed back to knee planks.  Progressed stability exercises with physio ball including supine hamstring curls, supine single-leg hip thrust, and supine dead bugs.  Continued with prone soft tissue mobilization and lumbar mobilizations today.  In  future sessions will continue with additional stabilization strengthening for abdominals, hips, and low back. Patient encouraged to continue HEP, monitor for response to treatment today, and follow-up as scheduled. She has yet to achieve maximal benefit from physical therapy and pt will benefit from additional PT services to address deficits in back pain and improve pain-free function at home.    Personal Factors and Comorbidities Fitness;Past/Current Experience;Time since onset of injury/illness/exacerbation    Examination-Activity Limitations Bed Mobility;Lift;Stairs;Squat;Stand;Transfers;Sit    Examination-Participation Restrictions Community Activity;Yard Work    Stability/Clinical Decision Making Stable/Uncomplicated    Rehab Potential Fair    PT Frequency 2x / week    PT Duration 8 weeks    PT Treatment/Interventions ADLs/Self Care Home Management;Aquatic Therapy;Cryotherapy;Gait training;Stair training;Moist  Heat;Functional mobility training;Therapeutic activities;Therapeutic exercise;Balance training;Neuromuscular re-education;Patient/family education;Manual techniques;Dry needling;Electrical Stimulation;Spinal Manipulations;Joint Manipulations    PT Next Visit Plan prone manual tx, adominal and low back strengthening;    PT Home Exercise Plan [email or web address]    Consulted and Agree with Plan of Care Patient           Patient will benefit from skilled therapeutic intervention in order to improve the following deficits and impairments:  Abnormal gait,Decreased activity tolerance,Decreased endurance,Decreased mobility,Decreased range of motion,Hypomobility,Difficulty walking,Decreased strength,Increased muscle spasms,Impaired perceived functional ability,Pain  Visit Diagnosis: Chronic bilateral low back pain, unspecified whether sciatica present  Decreased strength     Problem List Patient Active Problem List   Diagnosis Date Noted  . Palpitations 08/25/2020  . DOE (dyspnea on exertion)  08/25/2020  . Mixed hyperlipidemia 08/25/2020  . History of palpitations 07/05/2020  . Shortness of breath 07/05/2020  . Chronic bilateral low back pain without sciatica 06/06/2020  . Lumbar facet arthropathy 06/06/2020  . Myofascial pain 06/06/2020  . Chronic pain syndrome 06/06/2020  . Cannabis use disorder, mild, abuse 06/06/2020  . Fibromyalgia 06/06/2020  . Elevated lipids 03/09/2020  . Encounter to establish care 02/22/2020  . Chronic abdominal pain 02/22/2020  . Hot flashes 02/22/2020  . Chronic back pain 02/22/2020  . Anxiety 02/22/2020   Amber Livingston PT, DPT, GCS  Amber Livingston 08/31/2020, 5:24 PM  [facility] [facility] [address] [place], [place], [address] Phone: [phone]   Fax:  [phone]  Name: Amber Livingston MRN: [id number] Date of Birth: 12/12/1960

## 2020-08-31 NOTE — Telephone Encounter (Signed)
Called pt to schedule follow up. Pt wanted to come in after visit with cardiologist on 03/07

## 2020-09-05 ENCOUNTER — Ambulatory Visit: Payer: No Typology Code available for payment source

## 2020-09-05 ENCOUNTER — Other Ambulatory Visit: Payer: Self-pay

## 2020-09-05 ENCOUNTER — Ambulatory Visit: Payer: Self-pay | Admitting: Licensed Clinical Social Worker

## 2020-09-05 DIAGNOSIS — G8929 Other chronic pain: Secondary | ICD-10-CM

## 2020-09-05 DIAGNOSIS — M545 Low back pain, unspecified: Secondary | ICD-10-CM

## 2020-09-05 DIAGNOSIS — R2689 Other abnormalities of gait and mobility: Secondary | ICD-10-CM

## 2020-09-05 DIAGNOSIS — R531 Weakness: Secondary | ICD-10-CM

## 2020-09-05 NOTE — Therapy (Signed)
[facility] [address] [place], [place], [address] Phone: [phone]   Fax:  [phone]  Physical Therapy Treatment  Patient Details  Name: Amber Livingston MRN: [id number] Date of Birth: 1961/04/15 Referring Provider (PT): Dr. Gillis Santa   Encounter Date: 09/05/2020   PT End of Session - 09/05/20 1436    Visit Number 14    Number of Visits 25    Date for PT Re-Evaluation 09/28/20    PT Start Time 1432    PT Stop Time 1515    PT Time Calculation (min) 43 min    Activity Tolerance Patient tolerated treatment well    Behavior During Therapy [facility] for tasks assessed/performed           Past Medical History:  Diagnosis Date  . Uterine fibroid     Past Surgical History:  Procedure Laterality Date  . ablation    . APPENDECTOMY    . DILATION AND CURETTAGE OF UTERUS     x3 for miscarriage  . LAPAROSCOPY     Fibroid removal  . PALPITATION      There were no vitals filed for this visit.   Subjective Assessment - 09/05/20 1436    Subjective Pt reports that she is doing well today.She did have some pain over the weekend but it was tolerable and considerably better than it has been in the past.  She reports some stiffness in her back today but no pain. No specific questions or concerns.    Pertinent History fibromyalgia    Patient Stated Goals return to work, improve strength and mobility, decrease pain    Currently in Pain? No/denies             TREATMENT   Manual Therapy Moist heat to lumbar spine during interval history x 5 minutes; Prone STM tolumbar region bilateral paraspinals,quadratus lumborum,and glutMax/med/piriformis muscles utilizingpercussive STM with Hypervolt; Grade I-II CPA grade mobs L1-L5, 20s/bout x2bouts/level; Grade I-II UPA grade mobs L1-L5 bilateral, 20s/bout x2bouts/level;   Ther-ex Prone straight and bent knee hip extension x 10 each bilaterally; Quadruped cat/camel x 10  each direction; Quadruped bird dogs x10, tactile cues from therapist for core stability; Quadruped fire hydrants x 10 BLE; Child's pose stretch 30 second hold x 2with overpressure into flexion by therapist, lateral bias also performed x30 seconds each direction with therapist providing distraction of the hips to help with lateral flexion stretch; Front knee planks 20s hold, 20s relax x 5; Pilates 100 in tabletop position. Pt able to make it to 70 and has to stop secondary to fatigue; Supinephysioball hamstring curls x 10; Supine physioball single leg hip thrust x 10 BLE; Supine physioball dead bugs x10 on each side;   Pt educated throughout session about proper posture and technique with exercises. Improved exercise technique, movement at target joints, use of target muscles after min to mod verbal, visual, tactile cues.   Patient demonstrates excellent motivation during session today.She is making excellent progress with therapy and demonstrates improved hip and trunk strength/control during session.  Continued with prone soft tissue mobilization and lumbar mobilizationstoday. She is able to progress some abdominal and lumbar strengthening today. Patient encouraged to continue HEP, monitor for response to treatment today, and follow-up as scheduled. She has yet to achieve maximal benefit from physical therapy and pt will benefit fromadditionalPT services to address deficits in back pain and improve pain-free function at home.  PT Long Term Goals - 08/10/20 1630      PT LONG TERM GOAL #1   Title Pt. will perform 5xSTS in 12 seconds or less to demonstrate improvements in LE strength and functional mobility.    Baseline 11/16: 19.4s; 08/03/20: 11.5s    Time 8    Period Weeks    Status Achieved      PT LONG TERM GOAL #2   Title Pt. will report ability to vaccuum for greater than 10 mins with no increase in back pain over  2/10 to improve pain free functional mobility.    Baseline 11/16: pt is currently not able to vaccuum for 4-5 minutes with back pain at 5/10; 08/03/20: Pt can vaccuum for 4-5 minutes, no pain but fatigue, rest for a minute and continue.    Time 8    Period Weeks    Status Partially Met    Target Date 09/28/20      PT LONG TERM GOAL #3   Title Pt. will improve FOTO to 50 to demonstrate improvement in pain free functional mobility.    Baseline 11/16: 35; 08/03/20: 63    Time 4    Period Weeks    Status Achieved      PT LONG TERM GOAL #4   Title Pt. independent with HEP to increase B hip strength 1/2 muscle grade to improve standing tolerance/ pain-free household tasks.    Baseline Pt. demonstrates 5/5 strength bilaterally, except for bilat hip IR, ER, and flexion which scored 4/5 for bilat for each. 08/03/20: R/L Hip flexion: 4+/4+, hip extension: 4/4, hip abduction: 4+/4+, hip adduction: 4+/4    Time 8    Period Weeks    Status Partially Met    Target Date 09/28/20                 Plan - 09/05/20 1436    Clinical Impression Statement Patient demonstrates excellent motivation during session today. She is making excellent progress with therapy and demonstrates improved hip and trunk strength/control during session.   Continued with prone soft tissue mobilization and lumbar mobilizations today. She is able to progress some abdominal and lumbar strengthening today. Patient encouraged to continue HEP, monitor for response to treatment today, and follow-up as scheduled. She has yet to achieve maximal benefit from physical therapy and pt will benefit from additional PT services to address deficits in back pain and improve pain-free function at home.    Personal Factors and Comorbidities Fitness;Past/Current Experience;Time since onset of injury/illness/exacerbation    Examination-Activity Limitations Bed Mobility;Lift;Stairs;Squat;Stand;Transfers;Sit    Examination-Participation  Restrictions Community Activity;Yard Work    Stability/Clinical Decision Making Stable/Uncomplicated    Rehab Potential Fair    PT Frequency 2x / week    PT Duration 8 weeks    PT Treatment/Interventions ADLs/Self Care Home Management;Aquatic Therapy;Cryotherapy;Gait training;Stair training;Moist Heat;Functional mobility training;Therapeutic activities;Therapeutic exercise;Balance training;Neuromuscular re-education;Patient/family education;Manual techniques;Dry needling;Electrical Stimulation;Spinal Manipulations;Joint Manipulations    PT Next Visit Plan prone manual tx, adominal and low back strengthening;    PT Home Exercise Plan [email or web address]    Consulted and Agree with Plan of Care Patient           Patient will benefit from skilled therapeutic intervention in order to improve the following deficits and impairments:  Abnormal gait,Decreased activity tolerance,Decreased endurance,Decreased mobility,Decreased range of motion,Hypomobility,Difficulty walking,Decreased strength,Increased muscle spasms,Impaired perceived functional ability,Pain  Visit Diagnosis: Chronic bilateral low back pain, unspecified whether sciatica present  Decreased strength  Decreased mobility     Problem  List Patient Active Problem List   Diagnosis Date Noted  . Palpitations 08/25/2020  . DOE (dyspnea on exertion) 08/25/2020  . Mixed hyperlipidemia 08/25/2020  . History of palpitations 07/05/2020  . Shortness of breath 07/05/2020  . Chronic bilateral low back pain without sciatica 06/06/2020  . Lumbar facet arthropathy 06/06/2020  . Myofascial pain 06/06/2020  . Chronic pain syndrome 06/06/2020  . Cannabis use disorder, mild, abuse 06/06/2020  . Fibromyalgia 06/06/2020  . Elevated lipids 03/09/2020  . Encounter to establish care 02/22/2020  . Chronic abdominal pain 02/22/2020  . Hot flashes 02/22/2020  . Chronic back pain 02/22/2020  . Anxiety 02/22/2020   Amber Livingston PT, DPT, GCS   Amber Livingston 09/05/2020, 4:09 PM  [facility] [address]. [place], [place], [address] Phone: [phone]   Fax:  [phone]  Name: Amber Livingston MRN: [id number] Date of Birth: Jul 16, 1961

## 2020-09-07 ENCOUNTER — Ambulatory Visit: Payer: No Typology Code available for payment source

## 2020-09-12 ENCOUNTER — Ambulatory Visit: Payer: No Typology Code available for payment source

## 2020-09-12 ENCOUNTER — Other Ambulatory Visit: Payer: Self-pay

## 2020-09-12 DIAGNOSIS — R531 Weakness: Secondary | ICD-10-CM

## 2020-09-12 DIAGNOSIS — G8929 Other chronic pain: Secondary | ICD-10-CM

## 2020-09-12 DIAGNOSIS — M545 Low back pain, unspecified: Secondary | ICD-10-CM

## 2020-09-12 NOTE — Therapy (Signed)
[facility] [address] [place], [place], [address] Phone: [phone]   Fax:  [phone]  Physical Therapy Treatment  Patient Details  Name: Amber Livingston MRN: [id number] Date of Birth: 08/24/1960 Referring Provider (PT): Dr. Gillis Santa   Encounter Date: 09/12/2020   PT End of Session - 09/12/20 1515    Visit Number 15    Number of Visits 25    Date for PT Re-Evaluation 09/28/20    PT Start Time 1435    PT Stop Time 1515    PT Time Calculation (min) 40 min    Activity Tolerance Patient tolerated treatment well    Behavior During Therapy [facility] for tasks assessed/performed           Past Medical History:  Diagnosis Date  . Uterine fibroid     Past Surgical History:  Procedure Laterality Date  . ablation    . APPENDECTOMY    . DILATION AND CURETTAGE OF UTERUS     x3 for miscarriage  . LAPAROSCOPY     Fibroid removal  . PALPITATION      There were no vitals filed for this visit.   Subjective Assessment - 09/12/20 1435    Subjective Pt reports that she is doing alright today. She had a fibromyalgia flare over the last few days with an increase in her pain and fatigue.  She denies any resting pain upon arrival today but complains of significant stiffness in her back. No specific questions currently.    Pertinent History fibromyalgia    Patient Stated Goals return to work, improve strength and mobility, decrease pain    Currently in Pain? No/denies   No resting pain but significant stiffness             TREATMENT   Manual Therapy Moist heat to lumbar spine during interval history x 5 minutes; Prone STM tolumbar region bilateral paraspinals,quadratus lumborum,and glutMax/med/piriformis muscles utilizingpercussive STM with Hypervolt; Grade I-II CPA grade mobs L1-L5, 20s/bout x2bouts/level; Grade I-II UPA grade mobs L1-L5 bilateral, 20s/bout x2bouts/level; Prone hip flexor stretch x45 seconds  bilaterally; Supine single knee-to-chest stretch x45 seconds bilaterally; Supine hip IR and ER stretches x45 seconds each bilaterally; Supine hooklying lumbothoracic rotation stretch x45 seconds toward each side; Prone press up static hold x15 seconds, repeated twice; Prone press ups with overpressure at lower lumbar spine L4/5 x5;   Ther-ex Prone straight and bent knee hip extension x 10 each bilaterally; Quadruped cat/camel x 10 each direction; Child's pose stretch 30 second hold x 2with overpressure into flexion by therapist,    Pt educated throughout session about proper posture and technique with exercises. Improved exercise technique, movement at target joints, use of target muscles after min to mod verbal, visual, tactile cues.   Patient reporting increased back stiffness upon arrival today.  Most of session today focused on manual treatment with limited strengthening exercises due to the increased stiffness today. Continued with prone soft tissue mobilization and lumbar mobilizationstoday. Patient encouraged to continue HEP, monitor for response to treatment today, and follow-up as scheduled.  We will return to additional strengthening once the current stiffness resolves. Overall she is making excellent progress with therapy despite intermittent brief setbacks. She has yet to achieve maximal benefit from physical therapy and pt will benefit fromadditionalPT services to address deficits in back pain and improve pain-free function at home.  PT Long Term Goals - 08/10/20 1630      PT LONG TERM GOAL #1   Title Pt. will perform 5xSTS in 12 seconds or less to demonstrate improvements in LE strength and functional mobility.    Baseline 11/16: 19.4s; 08/03/20: 11.5s    Time 8    Period Weeks    Status Achieved      PT LONG TERM GOAL #2   Title Pt. will report ability to vaccuum for greater than 10 mins with no  increase in back pain over 2/10 to improve pain free functional mobility.    Baseline 11/16: pt is currently not able to vaccuum for 4-5 minutes with back pain at 5/10; 08/03/20: Pt can vaccuum for 4-5 minutes, no pain but fatigue, rest for a minute and continue.    Time 8    Period Weeks    Status Partially Met    Target Date 09/28/20      PT LONG TERM GOAL #3   Title Pt. will improve FOTO to 50 to demonstrate improvement in pain free functional mobility.    Baseline 11/16: 35; 08/03/20: 63    Time 4    Period Weeks    Status Achieved      PT LONG TERM GOAL #4   Title Pt. independent with HEP to increase B hip strength 1/2 muscle grade to improve standing tolerance/ pain-free household tasks.    Baseline Pt. demonstrates 5/5 strength bilaterally, except for bilat hip IR, ER, and flexion which scored 4/5 for bilat for each. 08/03/20: R/L Hip flexion: 4+/4+, hip extension: 4/4, hip abduction: 4+/4+, hip adduction: 4+/4    Time 8    Period Weeks    Status Partially Met    Target Date 09/28/20                 Plan - 09/12/20 1541    Clinical Impression Statement Patient reporting increased back stiffness upon arrival today.  Most of session today focused on manual treatment with limited strengthening exercises due to the increased stiffness today. Continued with prone soft tissue mobilization and lumbar mobilizations today. Patient encouraged to continue HEP, monitor for response to treatment today, and follow-up as scheduled.  We will return to additional strengthening once the current stiffness resolves. Overall she is making excellent progress with therapy despite intermittent brief setbacks. She has yet to achieve maximal benefit from physical therapy and pt will benefit from additional PT services to address deficits in back pain and improve pain-free function at home.    Personal Factors and Comorbidities Fitness;Past/Current Experience;Time since onset of  injury/illness/exacerbation    Examination-Activity Limitations Bed Mobility;Lift;Stairs;Squat;Stand;Transfers;Sit    Examination-Participation Restrictions Community Activity;Yard Work    Stability/Clinical Decision Making Stable/Uncomplicated    Rehab Potential Fair    PT Frequency 2x / week    PT Duration 8 weeks    PT Treatment/Interventions ADLs/Self Care Home Management;Aquatic Therapy;Cryotherapy;Gait training;Stair training;Moist Heat;Functional mobility training;Therapeutic activities;Therapeutic exercise;Balance training;Neuromuscular re-education;Patient/family education;Manual techniques;Dry needling;Electrical Stimulation;Spinal Manipulations;Joint Manipulations    PT Next Visit Plan prone manual tx, adominal and low back strengthening;    PT Home Exercise Plan [email or web address]    Consulted and Agree with Plan of Care Patient           Patient will benefit from skilled therapeutic intervention in order to improve the following deficits and impairments:  Abnormal gait,Decreased activity tolerance,Decreased endurance,Decreased mobility,Decreased range of motion,Hypomobility,Difficulty walking,Decreased strength,Increased muscle spasms,Impaired perceived functional ability,Pain  Visit Diagnosis: Chronic bilateral low back pain,  unspecified whether sciatica present  Decreased strength     Problem List Patient Active Problem List   Diagnosis Date Noted  . Palpitations 08/25/2020  . DOE (dyspnea on exertion) 08/25/2020  . Mixed hyperlipidemia 08/25/2020  . History of palpitations 07/05/2020  . Shortness of breath 07/05/2020  . Chronic bilateral low back pain without sciatica 06/06/2020  . Lumbar facet arthropathy 06/06/2020  . Myofascial pain 06/06/2020  . Chronic pain syndrome 06/06/2020  . Cannabis use disorder, mild, abuse 06/06/2020  . Fibromyalgia 06/06/2020  . Elevated lipids 03/09/2020  . Encounter to establish care 02/22/2020  . Chronic abdominal pain 02/22/2020  .  Hot flashes 02/22/2020  . Chronic back pain 02/22/2020  . Anxiety 02/22/2020   Phillips Grout PT, DPT, GCS  Corina Stacy 09/12/2020, 3:48 PM  [facility] [address]. [place], [place], [address] Phone: [phone]   Fax:  [phone]  Name: Elleigh Cassetta MRN: [id number] Date of Birth: 09-19-1960

## 2020-09-14 ENCOUNTER — Ambulatory Visit: Payer: Self-pay | Admitting: Licensed Clinical Social Worker

## 2020-09-14 ENCOUNTER — Ambulatory Visit: Payer: No Typology Code available for payment source

## 2020-09-14 ENCOUNTER — Other Ambulatory Visit: Payer: Self-pay

## 2020-09-14 DIAGNOSIS — F1220 Cannabis dependence, uncomplicated: Secondary | ICD-10-CM

## 2020-09-14 DIAGNOSIS — F411 Generalized anxiety disorder: Secondary | ICD-10-CM

## 2020-09-14 DIAGNOSIS — F451 Undifferentiated somatoform disorder: Secondary | ICD-10-CM

## 2020-09-14 DIAGNOSIS — R531 Weakness: Secondary | ICD-10-CM

## 2020-09-14 DIAGNOSIS — M545 Low back pain, unspecified: Secondary | ICD-10-CM

## 2020-09-14 NOTE — Therapy (Signed)
[facility] [address]. [place], [place], [address] Phone: [phone]   Fax:  [phone]  Physical Therapy Treatment  Patient Details  Name: Amber Livingston MRN: [id number] Date of Birth: 05-26-1961 Referring Provider (PT): Dr. Gillis Santa   Encounter Date: 09/14/2020   PT End of Session - 09/14/20 1351    Visit Number 16    Number of Visits 25    Date for PT Re-Evaluation 09/28/20    PT Start Time 1347    PT Stop Time 1430    PT Time Calculation (min) 43 min    Activity Tolerance Patient tolerated treatment well    Behavior During Therapy [facility] for tasks assessed/performed           Past Medical History:  Diagnosis Date  . Uterine fibroid     Past Surgical History:  Procedure Laterality Date  . ablation    . APPENDECTOMY    . DILATION AND CURETTAGE OF UTERUS     x3 for miscarriage  . LAPAROSCOPY     Fibroid removal  . PALPITATION      There were no vitals filed for this visit.   Subjective Assessment - 09/14/20 1350    Subjective Pt reports that she is doing alright today. She reports glut soreness after her last therapy session and back stiffness yesterday. She denies any resting pain upon arrival today. No specific questions currently.    Pertinent History fibromyalgia    Patient Stated Goals return to work, improve strength and mobility, decrease pain    Currently in Pain? No/denies              TREATMENT   Manual Therapy Moist heat to lumbar spine during interval history x 5 minutes; Prone STM tolumbar region bilateral paraspinals,quadratus lumborum,and glutMax/med/piriformis muscles utilizingpercussive STM with Hypervolt, effleurage performed to low back; Grade I-II CPA grade mobs L1-L5, 20s/bout x2bouts/level; Grade I-II UPA grade mobs L1-L5 bilateral, 20s/bout x2bouts/level;   Ther-ex Quadruped cat/camel x 10 each direction; Child's pose stretch 30 second hold intermittently  between exercises, intermittent overpressure into flexion by therapist,  Quadruped bird dogs x10, tactile cues from therapist for core stability; Quadruped fire hydrants x 10 BLE; Front planks 10s hold, 10s relax x 5, pt reports back stiffness but no pain today; Side knee planks 15s hold, 15s relax x 3 on each side, attempted full plank but too difficult for patient; Supinephysioball hamstring curls x 10; Supine physioball single leg hip thrust x 10 BLE; Supine physioball dead bugs x10 on each side;   Pt educated throughout session about proper posture and technique with exercises. Improved exercise technique, movement at target joints, use of target muscles after min to mod verbal, visual, tactile cues.   Patient reporting that she is doing well today. More time spent today performing strengthening compared to previous session as her pain is significantly better than last session. Continued with prone soft tissue mobilization and lumbar mobilizationstoday. Patient encouraged to continue HEP, monitor for response to treatment today, and follow-up as scheduled. Overall she is making excellent progress with therapy despite intermittent brief setbacks. Her core stability is improving with each session and she is able to perform full planks today without pain. She has yet to achieve maximal benefit from physical therapy and pt will benefit fromadditionalPT services to address deficits in back pain and improve pain-free function at home.  PT Long Term Goals - 08/10/20 1630      PT LONG TERM GOAL #1   Title Pt. will perform 5xSTS in 12 seconds or less to demonstrate improvements in LE strength and functional mobility.    Baseline 11/16: 19.4s; 08/03/20: 11.5s    Time 8    Period Weeks    Status Achieved      PT LONG TERM GOAL #2   Title Pt. will report ability to vaccuum for greater than 10 mins with no increase in back pain over  2/10 to improve pain free functional mobility.    Baseline 11/16: pt is currently not able to vaccuum for 4-5 minutes with back pain at 5/10; 08/03/20: Pt can vaccuum for 4-5 minutes, no pain but fatigue, rest for a minute and continue.    Time 8    Period Weeks    Status Partially Met    Target Date 09/28/20      PT LONG TERM GOAL #3   Title Pt. will improve FOTO to 50 to demonstrate improvement in pain free functional mobility.    Baseline 11/16: 35; 08/03/20: 63    Time 4    Period Weeks    Status Achieved      PT LONG TERM GOAL #4   Title Pt. independent with HEP to increase B hip strength 1/2 muscle grade to improve standing tolerance/ pain-free household tasks.    Baseline Pt. demonstrates 5/5 strength bilaterally, except for bilat hip IR, ER, and flexion which scored 4/5 for bilat for each. 08/03/20: R/L Hip flexion: 4+/4+, hip extension: 4/4, hip abduction: 4+/4+, hip adduction: 4+/4    Time 8    Period Weeks    Status Partially Met    Target Date 09/28/20                 Plan - 09/14/20 1352    Clinical Impression Statement Patient reporting that she is doing well today. More time spent today performing strengthening compared to previous session as her pain is significantly better than last session. Continued with prone soft tissue mobilization and lumbar mobilizations today. Patient encouraged to continue HEP, monitor for response to treatment today, and follow-up as scheduled. Overall she is making excellent progress with therapy despite intermittent brief setbacks. Her core stability is improving with each session and she is able to perform full planks today without pain. She has yet to achieve maximal benefit from physical therapy and pt will benefit from additional PT services to address deficits in back pain and improve pain-free function at home.    Personal Factors and Comorbidities Fitness;Past/Current Experience;Time since onset of injury/illness/exacerbation     Examination-Activity Limitations Bed Mobility;Lift;Stairs;Squat;Stand;Transfers;Sit    Examination-Participation Restrictions Community Activity;Yard Work    Stability/Clinical Decision Making Stable/Uncomplicated    Rehab Potential Fair    PT Frequency 2x / week    PT Duration 8 weeks    PT Treatment/Interventions ADLs/Self Care Home Management;Aquatic Therapy;Cryotherapy;Gait training;Stair training;Moist Heat;Functional mobility training;Therapeutic activities;Therapeutic exercise;Balance training;Neuromuscular re-education;Patient/family education;Manual techniques;Dry needling;Electrical Stimulation;Spinal Manipulations;Joint Manipulations    PT Next Visit Plan prone manual tx, adominal and low back strengthening;    PT Home Exercise Plan [email or web address]    Consulted and Agree with Plan of Care Patient           Patient will benefit from skilled therapeutic intervention in order to improve the following deficits and impairments:  Abnormal gait,Decreased activity tolerance,Decreased endurance,Decreased mobility,Decreased range of motion,Hypomobility,Difficulty walking,Decreased strength,Increased muscle spasms,Impaired perceived functional ability,Pain  Visit Diagnosis: Chronic bilateral low back pain, unspecified whether sciatica present  Decreased strength     Problem List Patient Active Problem List   Diagnosis Date Noted  . Palpitations 08/25/2020  . DOE (dyspnea on exertion) 08/25/2020  . Mixed hyperlipidemia 08/25/2020  . History of palpitations 07/05/2020  . Shortness of breath 07/05/2020  . Chronic bilateral low back pain without sciatica 06/06/2020  . Lumbar facet arthropathy 06/06/2020  . Myofascial pain 06/06/2020  . Chronic pain syndrome 06/06/2020  . Cannabis use disorder, mild, abuse 06/06/2020  . Fibromyalgia 06/06/2020  . Elevated lipids 03/09/2020  . Encounter to establish care 02/22/2020  . Chronic abdominal pain 02/22/2020  . Hot flashes 02/22/2020  .  Chronic back pain 02/22/2020  . Anxiety 02/22/2020   Phillips Grout PT, DPT, GCS  Huprich,Jason 09/14/2020, 2:44 PM  [facility] [address] [place], [place], [address] Phone: [phone]   Fax:  [phone]  Name: Amber Livingston MRN: [id number] Date of Birth: January 30, 1961

## 2020-09-14 NOTE — BH Specialist Note (Signed)
Integrated Behavioral Health Follow Up In-Person Visit  MRN: [id number] Name: Amber Livingston  Total time: 30 minutes  Types of Service: [place] ([facility])  Interpretor:No. Interpretor Name and Language:   Subjective: Amber Livingston is a 60 y.o. female accompanied by herself Patient was referred by Malachy Mood for mental health Patient reports the following symptoms/concerns: The patient stated that she has been wearing a heart monitor for the last two weeks, and is looking forward to having it removed tomorrow. She notes that she has to have several tests on her heart and will need to quarantine after a required COVID test so she can have the procedures. Amber Livingston also stated that she is concerned to hear what the Cardiologist may have to say, but is determined to take it one step at a time. The patient reports that she has been going twice a week to physical therapy and is so pleased with the progress she has made there. She states that her severe lower back pain is 70% better and she now has some pain free days. The patient notes that she is looking forward to making more progress and is hopeful that she may be able to return to work again soon. She states that some days she has an abundance of energy now and is overall doing very well. The patient noted that her best friend of over 25 years is nearing the end of his life and that has been hard to deal with. She discussed her plans for her self care. The patient denied any suicidal or homicidal thoughts.    Duration of problem: ; Severity of problem: moderate  Objective: Mood: Euthymic and Affect: Appropriate Risk of harm to self or others: No plan to harm self or others  Life Context: Family and Social: see above School/Work: see above Self-Care: see above Life Changes: see above  Patient and/or Family's Strengths/Protective Factors: Concrete supports in place (healthy food, safe environments, etc.)  Goals  Addressed: Patient will: 1.  Reduce symptoms of: anxiety, depression and insomnia  2.  Increase knowledge and/or ability of: coping skills, healthy habits and stress reduction  3.  Demonstrate ability to: Increase healthy adjustment to current life circumstances  Progress towards Goals: Revised and Ongoing  Interventions: Interventions utilized:  CBT Cognitive Behavioral Therapy was utilized by the clinician during today's follow-up visit. The clinician processed with the patient how she has been doing since her last follow-up visit. The clinician asked the patient open ended questions and utilized reflective listening to assess the patient's motivation to change. The clinician offered education regarding the importance of self care. The clinician encouraged the patient to continue to utilize her coping skills and practice self care to deal with her current life circumstances.  Standardized Assessments completed: GAD-7 and PHQ 9 GAD-7   8 PHQ-9   7 Assessment: Patient currently experiencing see above  Patient may benefit from see above  Plan: 1. Follow up with behavioral health clinician on : 09/27/2020 at 4:30 PM  2. Behavioral recommendations:  3. Referral(s): [place] (In Clinic) 4. "From scale of 1-10, how likely are you to follow plan?":   Lesli Albee, Student-Social Work

## 2020-09-18 ENCOUNTER — Other Ambulatory Visit (HOSPITAL_COMMUNITY)
Admission: RE | Admit: 2020-09-18 | Discharge: 2020-09-18 | Disposition: A | Discharge status: Home / Self Care | Payer: No Typology Code available for payment source | Source: Ambulatory Visit | Attending: Internal Medicine | Admitting: Internal Medicine

## 2020-09-18 DIAGNOSIS — Z01812 Encounter for preprocedural laboratory examination: Secondary | ICD-10-CM | POA: Insufficient documentation

## 2020-09-18 DIAGNOSIS — Z20822 Contact with and (suspected) exposure to covid-19: Secondary | ICD-10-CM | POA: Insufficient documentation

## 2020-09-18 LAB — SARS CORONAVIRUS 2 (TAT 6-24 HRS): SARS Coronavirus 2: NEGATIVE

## 2020-09-21 ENCOUNTER — Ambulatory Visit (INDEPENDENT_AMBULATORY_CARE_PROVIDER_SITE_OTHER): Payer: No Typology Code available for payment source

## 2020-09-21 ENCOUNTER — Other Ambulatory Visit: Payer: Self-pay

## 2020-09-21 ENCOUNTER — Ambulatory Visit (HOSPITAL_COMMUNITY): Payer: No Typology Code available for payment source | Attending: Cardiovascular Disease

## 2020-09-21 DIAGNOSIS — R0600 Dyspnea, unspecified: Secondary | ICD-10-CM

## 2020-09-21 DIAGNOSIS — R0609 Other forms of dyspnea: Secondary | ICD-10-CM

## 2020-09-21 LAB — EXERCISE TOLERANCE TEST
Estimated workload: 8 METS
Exercise duration (min): 6 min
Exercise duration (sec): 37 s
MPHR: 161 {beats}/min
Peak HR: 130 {beats}/min
Percent HR: 80 %
RPE: 18
Rest HR: 59 {beats}/min

## 2020-09-21 LAB — ECHOCARDIOGRAM COMPLETE
Area-P 1/2: 4.31 cm2
S' Lateral: 2.8 cm

## 2020-09-22 ENCOUNTER — Other Ambulatory Visit: Payer: Self-pay | Admitting: *Deleted

## 2020-09-22 DIAGNOSIS — R0600 Dyspnea, unspecified: Secondary | ICD-10-CM

## 2020-09-22 DIAGNOSIS — R0609 Other forms of dyspnea: Secondary | ICD-10-CM

## 2020-09-26 ENCOUNTER — Other Ambulatory Visit: Payer: Self-pay

## 2020-09-26 ENCOUNTER — Telehealth: Payer: Self-pay | Admitting: Internal Medicine

## 2020-09-26 ENCOUNTER — Ambulatory Visit
Payer: No Typology Code available for payment source | Attending: Student in an Organized Health Care Education/Training Program

## 2020-09-26 DIAGNOSIS — R2689 Other abnormalities of gait and mobility: Secondary | ICD-10-CM | POA: Insufficient documentation

## 2020-09-26 DIAGNOSIS — G8929 Other chronic pain: Secondary | ICD-10-CM | POA: Insufficient documentation

## 2020-09-26 DIAGNOSIS — M545 Low back pain, unspecified: Secondary | ICD-10-CM | POA: Insufficient documentation

## 2020-09-26 DIAGNOSIS — R531 Weakness: Secondary | ICD-10-CM | POA: Insufficient documentation

## 2020-09-26 NOTE — Therapy (Signed)
[facility] [address] [place], [place], [address] Phone: [phone]   Fax:  [phone]  Physical Therapy Treatment  Patient Details  Name: Amber Livingston MRN: [id number] Date of Birth: 01/30/1961 Referring Provider (PT): Dr. Bilal Lateef   Encounter Date: 09/26/2020   PT End of Session - 09/26/20 1304    Visit Number 17    Number of Visits 25    Date for PT Re-Evaluation 09/28/20    PT Start Time 1300    PT Stop Time 1345    PT Time Calculation (min) 45 min    Activity Tolerance Patient tolerated treatment well    Behavior During Therapy WFL for tasks assessed/performed           Past Medical History:  Diagnosis Date  . Uterine fibroid     Past Surgical History:  Procedure Laterality Date  . ablation    . APPENDECTOMY    . DILATION AND CURETTAGE OF UTERUS     x3 for miscarriage  . LAPAROSCOPY     Fibroid removal  . PALPITATION      There were no vitals filed for this visit.   Subjective Assessment - 09/26/20 1303    Subjective Patient reported that she had an EKG done recently, concerned about her results, per pt pending further workup.    Pertinent History fibromyalgia    Currently in Pain? No/denies              TREATMENT    Manual Therapy    Moist heat to lumbar spine during interval history x 5 minutes;   Prone STM to lumbar region bilateral paraspinals, quadratus lumborum, and glut Max/med/piriformis muscles utilizing percussive STM with Hypervolt, effleurage performed to low back;   Grade I-II CPA grade mobs L1-L5, 20s/bout x 2 bouts/level;   Grade I-II UPA grade mobs L1-L5 bilateral, 20s/bout x 2 bouts/level;   Ther-ex     Quadruped cat/camel x 10 each direction;   Child's pose stretch 30 second hold intermittently between exercises, intermittent overpressure into flexion by therapist,    Quadruped bird dogs x 10, tactile cues from therapist for core stability;   Quadruped fire hydrants  x 10 BLE;   Front planks 10s hold, 10s relax x 5, pt reports back stiffness but no pain today;   Side knee planks 15s hold, 15s relax x 3 on each side, attempted full plank but too difficult for patient;   Supine physioball hamstring curls x 10;   Supine physioball single leg hip thrust x 10 BLE;     Pt educated throughout session about proper posture and technique with exercises. Improved exercise technique, movement at target joints, use of target muscles after min to mod verbal, visual, tactile cues.    Pt response/clinical impression: The patient demonstrated excellent motivation throughout session. Pt did need tactile and verbal cueing to maintain exercise form/technique during strengthening exercises. Most challenged by planks. She would benefit from further skilled PT intervention to continue to progress towards goals.     PT Education - 09/26/20 1303    Education Details therex    Person(s) Educated Patient    Methods Explanation;Demonstration;Tactile cues;Verbal cues    Comprehension Verbalized understanding               PT Long Term Goals - 08/10/20 1630      PT LONG TERM GOAL #1   Title Pt. will perform 5xSTS in 12 seconds or less to demonstrate improvements in   LE strength and functional mobility.    Baseline 11/16: 19.4s; 08/03/20: 11.5s    Time 8    Period Weeks    Status Achieved      PT LONG TERM GOAL #2   Title Pt. will report ability to vaccuum for greater than 10 mins with no increase in back pain over 2/10 to improve pain free functional mobility.    Baseline 11/16: pt is currently not able to vaccuum for 4-5 minutes with back pain at 5/10; 08/03/20: Pt can vaccuum for 4-5 minutes, no pain but fatigue, rest for a minute and continue.    Time 8    Period Weeks    Status Partially Met    Target Date 09/28/20      PT LONG TERM GOAL #3   Title Pt. will improve FOTO to 50 to demonstrate improvement in pain free functional mobility.    Baseline 11/16:  35; 08/03/20: 63    Time 4    Period Weeks    Status Achieved      PT LONG TERM GOAL #4   Title Pt. independent with HEP to increase B hip strength 1/2 muscle grade to improve standing tolerance/ pain-free household tasks.    Baseline Pt. demonstrates 5/5 strength bilaterally, except for bilat hip IR, ER, and flexion which scored 4/5 for bilat for each. 08/03/20: R/L Hip flexion: 4+/4+, hip extension: 4/4, hip abduction: 4+/4+, hip adduction: 4+/4    Time 8    Period Weeks    Status Partially Met    Target Date 09/28/20                 Plan - 09/26/20 1304    Clinical Impression Statement The patient demonstrated excellent motivation throughout session. Pt did need tactile and verbal cueing to maintain exercise form/technique during strengthening exercises. Most challenged by planks. She would benefit from further skilled PT intervention to continue to progress towards goals.    Personal Factors and Comorbidities Fitness;Past/Current Experience;Time since onset of injury/illness/exacerbation    Examination-Activity Limitations Bed Mobility;Lift;Stairs;Squat;Stand;Transfers;Sit    Examination-Participation Restrictions Community Activity;Yard Work    Stability/Clinical Decision Making Stable/Uncomplicated    Rehab Potential Fair    PT Frequency 2x / week    PT Duration 8 weeks    PT Treatment/Interventions ADLs/Self Care Home Management;Aquatic Therapy;Cryotherapy;Gait training;Stair training;Moist Heat;Functional mobility training;Therapeutic activities;Therapeutic exercise;Balance training;Neuromuscular re-education;Patient/family education;Manual techniques;Dry needling;Electrical Stimulation;Spinal Manipulations;Joint Manipulations    PT Next Visit Plan prone manual tx, adominal and low back strengthening;    PT Home Exercise Plan [email or web address]    Consulted and Agree with Plan of Care Patient           Patient will benefit from skilled therapeutic intervention in order to  improve the following deficits and impairments:  Abnormal gait,Decreased activity tolerance,Decreased endurance,Decreased mobility,Decreased range of motion,Hypomobility,Difficulty walking,Decreased strength,Increased muscle spasms,Impaired perceived functional ability,Pain  Visit Diagnosis: Chronic bilateral low back pain, unspecified whether sciatica present  Decreased strength  Decreased mobility     Problem List Patient Active Problem List   Diagnosis Date Noted  . Palpitations 08/25/2020  . DOE (dyspnea on exertion) 08/25/2020  . Mixed hyperlipidemia 08/25/2020  . History of palpitations 07/05/2020  . Shortness of breath 07/05/2020  . Chronic bilateral low back pain without sciatica 06/06/2020  . Lumbar facet arthropathy 06/06/2020  . Myofascial pain 06/06/2020  . Chronic pain syndrome 06/06/2020  . Cannabis use disorder, mild, abuse 06/06/2020  . Fibromyalgia 06/06/2020  . Elevated lipids 03/09/2020  . Encounter  to establish care 02/22/2020  . Chronic abdominal pain 02/22/2020  . Hot flashes 02/22/2020  . Chronic back pain 02/22/2020  . Anxiety 02/22/2020    Lieutenant Diego PT, DPT 1:51 PM,09/26/20   [facility] [address] [place], [place], [address] Phone: [phone]   Fax:  [phone]  Name: Ericah Scotto MRN: [id number] Date of Birth: 12-07-1960

## 2020-09-26 NOTE — Telephone Encounter (Signed)
Left message for patient to call and discuss scheduling the Cardiac MRI ordered by Dr. Gasper Sells

## 2020-09-27 ENCOUNTER — Ambulatory Visit: Payer: Self-pay | Admitting: Licensed Clinical Social Worker

## 2020-09-27 ENCOUNTER — Other Ambulatory Visit: Payer: No Typology Code available for payment source

## 2020-09-27 ENCOUNTER — Encounter: Payer: Self-pay | Admitting: Internal Medicine

## 2020-09-27 DIAGNOSIS — R0609 Other forms of dyspnea: Secondary | ICD-10-CM

## 2020-09-27 DIAGNOSIS — R0600 Dyspnea, unspecified: Secondary | ICD-10-CM

## 2020-09-27 LAB — CBC
Hematocrit: 37.5 % (ref 34.0–46.6)
Hemoglobin: 12.7 g/dL (ref 11.1–15.9)
MCH: 31.8 pg (ref 26.6–33.0)
MCHC: 33.9 g/dL (ref 31.5–35.7)
MCV: 94 fL (ref 79–97)
Platelets: 321 10*3/uL (ref 150–450)
RBC: 3.99 x10E6/uL (ref 3.77–5.28)
RDW: 12.5 % (ref 11.7–15.4)
WBC: 4.2 10*3/uL (ref 3.4–10.8)

## 2020-09-27 NOTE — Telephone Encounter (Signed)
Spoke with patient regarding appointment scheduled 10/25/20 at 12:00pm at [facility] for the Cardiac MRI ordered by Dr. Gasper Sells.  Arrival time is 11:30 am--1st floor admissions office.  Will mail information to patient and she voiced her understanding

## 2020-09-27 NOTE — Progress Notes (Signed)
Placed BMP for prior lab required before Cardiac MRI.

## 2020-09-28 LAB — BASIC METABOLIC PANEL
BUN/Creatinine Ratio: 15 (ref 9–23)
BUN: 11 mg/dL (ref 6–24)
CO2: 21 mmol/L (ref 20–29)
Calcium: 9.7 mg/dL (ref 8.7–10.2)
Chloride: 106 mmol/L (ref 96–106)
Creatinine, Ser: 0.75 mg/dL (ref 0.57–1.00)
GFR calc Af Amer: 101 mL/min/{1.73_m2} (ref >59)
GFR calc non Af Amer: 88 mL/min/{1.73_m2} (ref >59)
Glucose: 88 mg/dL (ref 65–99)
Potassium: 5 mmol/L (ref 3.5–5.2)
Sodium: 141 mmol/L (ref 134–144)

## 2020-10-03 ENCOUNTER — Ambulatory Visit: Payer: No Typology Code available for payment source

## 2020-10-03 ENCOUNTER — Other Ambulatory Visit: Payer: Self-pay

## 2020-10-03 DIAGNOSIS — G8929 Other chronic pain: Secondary | ICD-10-CM

## 2020-10-03 DIAGNOSIS — R531 Weakness: Secondary | ICD-10-CM

## 2020-10-03 DIAGNOSIS — R2689 Other abnormalities of gait and mobility: Secondary | ICD-10-CM

## 2020-10-03 DIAGNOSIS — M545 Low back pain, unspecified: Secondary | ICD-10-CM

## 2020-10-03 NOTE — Therapy (Signed)
[facility] [address] [place], [place], [address] Phone: [phone]   Fax:  [phone]  Physical Therapy Treatment and Re-certification  Patient Details  Name: Amber Livingston MRN: [id number] Date of Birth: [birth year] Referring Provider (PT): Dr. Edward Jolly   Encounter Date: 10/03/2020   PT End of Session - 10/03/20 1304    Visit Number 18    Number of Visits 25    Date for PT Re-Evaluation 10/31/20    PT Start Time 1300    PT Stop Time 1345    PT Time Calculation (min) 45 min    Activity Tolerance Patient tolerated treatment well    Behavior During Therapy [facility] for tasks assessed/performed           Past Medical History:  Diagnosis Date  . Uterine fibroid     Past Surgical History:  Procedure Laterality Date  . ablation    . APPENDECTOMY    . DILATION AND CURETTAGE OF UTERUS     x3 for miscarriage  . LAPAROSCOPY     Fibroid removal  . PALPITATION      There were no vitals filed for this visit.   Subjective Assessment - 10/03/20 1306    Subjective Patient reported that she attempted vacuuming and had to stop three times. Patient reported that she feels 70-80% better since initial evaluation. Still having difficulty with locking of lumbar spine, bilateral numbness to posterior legs (above knee) with functional activities, as well as decreased activity tolerance. In the last two weeks her pain has been as high as a 3/10, and at its best her pain has been 0/10.    Pertinent History fibromyalgia    Patient Stated Goals return to work, improve strength and mobility, decrease pain    Currently in Pain? Yes    Pain Score --   .5   Pain Orientation Lower    Pain Descriptors / Indicators Aching    Pain Type Chronic pain    Pain Onset More than a month ago    Pain Frequency Intermittent           TREATMENT         Manual Therapy     Moist heat to lumbar spine during interval history x 5 minutes;    Prone STM  to deep external rotators, glute med/glute max deep tissue Grade I-II CPA grade mobs L1-L5, 20s/bout x 2 bouts/level;   Grade I-II UPA grade mobs L1-L5 bilateral, 20s/bout x 2 bouts/level;       Ther-ex: Updated HEP, given handout:  Access Code: [email or web address] URL: [email or web address] Date: 10/03/2020 Prepared by: Olga Coaster     Quadruped cat/camel x 10 each direction;    Child's pose stretch 30 second hold intermittently between exercises, intermittent overpressure into flexion by therapist,     Quadruped fire hydrants x 10 BLE;    Single leg bridge (set up fouresquare) 2x10  Front planks 10s hold, 10s relax x 5, pt reports back stiffness but no pain today;    Pt educated throughout session about proper posture and technique with exercises. Improved exercise technique, movement at target joints, use of target muscles after min to mod verbal, visual, tactile cues.    Pt response/clinical impression: The patient continues to be motivated to participate with therapy to maximize function, independence, and mobility. Pt reported improvements with therapy overall, especially with pain levels and some improved abilities to perform functional activities. She still exhibited and reported difficulties with IADLs  and functional activities such as standing, squatting, household tasks, etc. The patient would benefit from further skilled PT intervention to continue to progress towards goals and prep patient for self management of condition.        PT Education - 10/03/20 1308    Education Details therex    Person(s) Educated Patient    Methods Explanation;Demonstration;Tactile cues;Verbal cues    Comprehension Verbalized understanding;Returned demonstration;Verbal cues required;Tactile cues required               PT Long Term Goals - 10/03/20 1308      PT LONG TERM GOAL #1   Title Pt. will perform 5xSTS in 12 seconds or less to demonstrate improvements in LE strength  and functional mobility.    Baseline 11/16: 19.4s; 08/03/20: 11.5s    Time 8    Period Weeks    Status Achieved      PT LONG TERM GOAL #2   Title Pt. will report ability to vaccuum for greater than 10 mins with no increase in back pain over 2/10 to improve pain free functional mobility.    Baseline 11/16: pt is currently not able to vaccuum for 4-5 minutes with back pain at 5/10; 08/03/20: Pt can vaccuum for 4-5 minutes, no pain but fatigue, rest for a minute and continue. 2/15 pt had to stop 3 times while vacuuming and rest for a minute, reported discomfort with activity. Biggest complaint of numbness    Time 8    Period Weeks    Status Partially Met      PT LONG TERM GOAL #3   Title Pt. will improve FOTO to 50 to demonstrate improvement in pain free functional mobility.    Baseline 11/16: 35; 08/03/20: 63    Time 4    Period Weeks    Status Achieved      PT LONG TERM GOAL #4   Title Pt. independent with HEP to increase B hip strength 1/2 muscle grade to improve standing tolerance/ pain-free household tasks.    Baseline Pt. demonstrates 5/5 strength bilaterally, except for bilat hip IR, ER, and flexion which scored 4/5 for bilat for each. 08/03/20: R/L Hip flexion: 4+/4+, hip extension: 4/4, hip abduction: 4+/4+, hip adduction: 4+/4; deferred to next session    Time 8    Period Weeks    Status Partially Met      PT LONG TERM GOAL #5   Title Patient will report and demonstrate the ability to perform finalized HEP independently, as well as an understanding on how to progress program to continue to maximize strength and function.    Time 4    Period Weeks    Status New    Target Date 10/31/20                 Plan - 10/03/20 1308    Clinical Impression Statement The patient continues to be motivated to participate with therapy to maximize function, independence, and mobility. Pt reported improvements with therapy overall, especially with pain levels and some improved  abilities to perform functional activities. She still exhibited and reported difficulties with IADLs and functional activities such as standing, squatting, household tasks, etc. The patient would benefit from further skilled PT intervention to continue to progress towards goals and prep patient for self management of condition.    Personal Factors and Comorbidities Fitness;Past/Current Experience;Time since onset of injury/illness/exacerbation    Examination-Activity Limitations Bed Mobility;Lift;Stairs;Squat;Stand;Transfers;Sit    Examination-Participation Restrictions Community Activity;[employer] Work  Stability/Clinical Decision Making Stable/Uncomplicated    Rehab Potential Fair    PT Frequency 2x / week    PT Duration 8 weeks    PT Treatment/Interventions ADLs/Self Care Home Management;Aquatic Therapy;Cryotherapy;Gait training;Stair training;Moist Heat;Functional mobility training;Therapeutic activities;Therapeutic exercise;Balance training;Neuromuscular re-education;Patient/family education;Manual techniques;Dry needling;Electrical Stimulation;Spinal Manipulations;Joint Manipulations    PT Next Visit Plan prone manual tx, adominal and low back strengthening;    PT Home Exercise Plan [email or web address]    Consulted and Agree with Plan of Care Patient           Patient will benefit from skilled therapeutic intervention in order to improve the following deficits and impairments:  Abnormal gait,Decreased activity tolerance,Decreased endurance,Decreased mobility,Decreased range of motion,Hypomobility,Difficulty walking,Decreased strength,Increased muscle spasms,Impaired perceived functional ability,Pain  Visit Diagnosis: Chronic bilateral low back pain, unspecified whether sciatica present - Plan: PT plan of care cert/re-cert  Decreased strength - Plan: PT plan of care cert/re-cert  Decreased mobility - Plan: PT plan of care cert/re-cert     Problem List Patient Active Problem List   Diagnosis  Date Noted  . Palpitations 08/25/2020  . DOE (dyspnea on exertion) 08/25/2020  . Mixed hyperlipidemia 08/25/2020  . History of palpitations 07/05/2020  . Shortness of breath 07/05/2020  . Chronic bilateral low back pain without sciatica 06/06/2020  . Lumbar facet arthropathy 06/06/2020  . Myofascial pain 06/06/2020  . Chronic pain syndrome 06/06/2020  . Cannabis use disorder, mild, abuse 06/06/2020  . Fibromyalgia 06/06/2020  . Elevated lipids 03/09/2020  . Encounter to establish care 02/22/2020  . Chronic abdominal pain 02/22/2020  . Hot flashes 02/22/2020  . Chronic back pain 02/22/2020  . Anxiety 02/22/2020    Lieutenant Diego PT, DPT [phone] PM,10/03/20   [place] [facility] [address] [place], [place], [address] Phone: [phone]   Fax:  [phone]  Name: Amber Livingston MRN: [id number] Date of Birth: 1961-07-04

## 2020-10-05 ENCOUNTER — Telehealth: Payer: Self-pay | Admitting: Pharmacist

## 2020-10-05 NOTE — Telephone Encounter (Signed)
Provided 2022 proof of income. Approved to receive medication assistance at [facility] until time for re-certification in 2023, and as long as eligibility criteria continues to be met.   [facility] 

## 2020-10-10 ENCOUNTER — Ambulatory Visit: Payer: No Typology Code available for payment source

## 2020-10-10 ENCOUNTER — Other Ambulatory Visit: Payer: Self-pay

## 2020-10-10 DIAGNOSIS — R531 Weakness: Secondary | ICD-10-CM

## 2020-10-10 DIAGNOSIS — M545 Low back pain, unspecified: Secondary | ICD-10-CM

## 2020-10-10 DIAGNOSIS — R2689 Other abnormalities of gait and mobility: Secondary | ICD-10-CM

## 2020-10-10 DIAGNOSIS — G8929 Other chronic pain: Secondary | ICD-10-CM

## 2020-10-10 NOTE — Therapy (Signed)
[facility] [address] [place], [place], [address] Phone: [phone]   Fax:  [phone]  Physical Therapy Treatment  Patient Details  Name: Amber Livingston MRN: [id number] Date of Birth: 05-08-1961 Referring Provider (PT): Dr. Gillis Santa   Encounter Date: 10/10/2020   PT End of Session - 10/10/20 1304    Visit Number 19    Number of Visits 25    Date for PT Re-Evaluation 10/31/20    PT Start Time 1300    PT Stop Time 1345    PT Time Calculation (min) 45 min    Activity Tolerance Patient tolerated treatment well    Behavior During Therapy [facility] for tasks assessed/performed           Past Medical History:  Diagnosis Date  . Uterine fibroid     Past Surgical History:  Procedure Laterality Date  . ablation    . APPENDECTOMY    . DILATION AND CURETTAGE OF UTERUS     x3 for miscarriage  . LAPAROSCOPY     Fibroid removal  . PALPITATION      There were no vitals filed for this visit.   Subjective Assessment - 10/10/20 1302    Subjective Patient reported after her appointment on Tuesday last week, she felt fine, and felt like "super woman" on Wednesday and really worked on her house to clean it up. On Thursday AM, woke up with siginficant pain in lumbar and cervical regions. Lasted 4 days, today is a bit better, reported no pain at the moment ( but did wake up with pain). did have some medicine prior to therapy, but for the last hour and a half has been pain free.    Pertinent History fibromyalgia    Patient Stated Goals return to work, improve strength and mobility, decrease pain    Currently in Pain? No/denies              TREATMENT      Manual Therapy      Moist heat to lumbar spine during interval history x 5 minutes;     Prone STM to deep external rotators, glute med/glute max deep tissue, hypervolt utilized as well   Grade I-II CPA grade mobs L1-L5, 20s/bout x 2 bouts/level;     Grade I-II UPA grade mobs  L1-L5 bilateral, 20s/bout x 2 bouts/level;       Ther-ex:   Child's pose stretch 30 second hold intermittently between exercises, intermittent overpressure into flexion by therapist,      Hands and feet Planks  4 x10sec holds  Side planks on knees 4 x10 second holds   Hip abduction 2x10 sidelying bilaterally  Bridges with ball single leg 2x10   Hamstring curls 2x10  Quadruped fire hydrants 2x10   HEP:  Access Code: [email or web address]   URL: [email or web address]   Date: 10/03/2020   Prepared by: Lieutenant Diego        Pt educated throughout session about proper posture and technique with exercises. Improved exercise technique, movement at target joints, use of target muscles after min to mod verbal, visual, tactile cues.         Pt response/clinical impression: Patient denied pain with session, reported fatigue with exercises especially in regards to planks as well as R glute focused exercises. Multimodal cueing needed throughout to maintain exercise form/technique as well as proper muscle activation. The patient was able to perform performed strengthening exercises as planned. The patient would benefit from further skilled PT  intervention to continue to progress towards goals.     PT Education - 10/10/20 1304    Education Details therex    Person(s) Educated Patient    Methods Explanation;Demonstration;Tactile cues;Verbal cues    Comprehension Verbalized understanding;Returned demonstration;Verbal cues required;Tactile cues required               PT Long Term Goals - 10/03/20 1308      PT LONG TERM GOAL #1   Title Pt. will perform 5xSTS in 12 seconds or less to demonstrate improvements in LE strength and functional mobility.    Baseline 11/16: 19.4s; 08/03/20: 11.5s    Time 8    Period Weeks    Status Achieved      PT LONG TERM GOAL #2   Title Pt. will report ability to vaccuum for greater than 10 mins with no increase in back pain over 2/10 to  improve pain free functional mobility.    Baseline 11/16: pt is currently not able to vaccuum for 4-5 minutes with back pain at 5/10; 08/03/20: Pt can vaccuum for 4-5 minutes, no pain but fatigue, rest for a minute and continue. 2/15 pt had to stop 3 times while vacuuming and rest for a minute, reported discomfort with activity. Biggest complaint of numbness    Time 8    Period Weeks    Status Partially Met      PT LONG TERM GOAL #3   Title Pt. will improve FOTO to 50 to demonstrate improvement in pain free functional mobility.    Baseline 11/16: 35; 08/03/20: 63    Time 4    Period Weeks    Status Achieved      PT LONG TERM GOAL #4   Title Pt. independent with HEP to increase B hip strength 1/2 muscle grade to improve standing tolerance/ pain-free household tasks.    Baseline Pt. demonstrates 5/5 strength bilaterally, except for bilat hip IR, ER, and flexion which scored 4/5 for bilat for each. 08/03/20: R/L Hip flexion: 4+/4+, hip extension: 4/4, hip abduction: 4+/4+, hip adduction: 4+/4; deferred to next session    Time 8    Period Weeks    Status Partially Met      PT LONG TERM GOAL #5   Title Patient will report and demonstrate the ability to perform finalized HEP independently, as well as an understanding on how to progress program to continue to maximize strength and function.    Time 4    Period Weeks    Status New    Target Date 10/31/20                 Plan - 10/10/20 1304    Clinical Impression Statement Patient denied pain with session, reported fatigue with exercises especially in regards to planks as well as R glute focused exercises. Multimodal cueing needed throughout to maintain exercise form/technique as well as proper muscle activation. The patient was able to perform performed strengthening exercises as planned. The patient would benefit from further skilled PT intervention to continue to progress towards goals.    Personal Factors and Comorbidities  Fitness;Past/Current Experience;Time since onset of injury/illness/exacerbation    Examination-Activity Limitations Bed Mobility;Lift;Stairs;Squat;Stand;Transfers;Sit    Examination-Participation Restrictions Community Activity;Yard Work    Stability/Clinical Decision Making Stable/Uncomplicated    Rehab Potential Fair    PT Frequency 2x / week    PT Duration 8 weeks    PT Treatment/Interventions ADLs/Self Care Home Management;Aquatic Therapy;Cryotherapy;Gait training;Stair training;Moist Heat;Functional mobility training;Therapeutic activities;Therapeutic exercise;Balance training;Neuromuscular  re-education;Patient/family education;Manual techniques;Dry needling;Electrical Stimulation;Spinal Manipulations;Joint Manipulations    PT Next Visit Plan prone manual tx, adominal and low back strengthening;    PT Home Exercise Plan [email or web address]    Consulted and Agree with Plan of Care Patient           Patient will benefit from skilled therapeutic intervention in order to improve the following deficits and impairments:  Abnormal gait,Decreased activity tolerance,Decreased endurance,Decreased mobility,Decreased range of motion,Hypomobility,Difficulty walking,Decreased strength,Increased muscle spasms,Impaired perceived functional ability,Pain  Visit Diagnosis: Chronic bilateral low back pain, unspecified whether sciatica present  Decreased strength  Decreased mobility     Problem List Patient Active Problem List   Diagnosis Date Noted  . Palpitations 08/25/2020  . DOE (dyspnea on exertion) 08/25/2020  . Mixed hyperlipidemia 08/25/2020  . History of palpitations 07/05/2020  . Shortness of breath 07/05/2020  . Chronic bilateral low back pain without sciatica 06/06/2020  . Lumbar facet arthropathy 06/06/2020  . Myofascial pain 06/06/2020  . Chronic pain syndrome 06/06/2020  . Cannabis use disorder, mild, abuse 06/06/2020  . Fibromyalgia 06/06/2020  . Elevated lipids 03/09/2020  .  Encounter to establish care 02/22/2020  . Chronic abdominal pain 02/22/2020  . Hot flashes 02/22/2020  . Chronic back pain 02/22/2020  . Anxiety 02/22/2020    Lieutenant Diego PT, DPT 1:56 PM,10/10/20   [facility] [address] [place], [place], [address] Phone: [phone]   Fax:  [phone]  Name: Amber Livingston MRN: [id number] Date of Birth: [birth year]

## 2020-10-11 ENCOUNTER — Ambulatory Visit: Payer: Self-pay | Admitting: Licensed Clinical Social Worker

## 2020-10-11 ENCOUNTER — Other Ambulatory Visit: Payer: Self-pay | Admitting: Gerontology

## 2020-10-11 DIAGNOSIS — F411 Generalized anxiety disorder: Secondary | ICD-10-CM

## 2020-10-11 DIAGNOSIS — F451 Undifferentiated somatoform disorder: Secondary | ICD-10-CM

## 2020-10-11 DIAGNOSIS — F1220 Cannabis dependence, uncomplicated: Secondary | ICD-10-CM

## 2020-10-11 NOTE — BH Specialist Note (Signed)
Integrated Behavioral Health Follow Up In-Person Visit  MRN: [id number] Name: Amber Livingston   Total time: 60 minutes  Types of Service: [place] (BHI)  Interpretor:No. Interpretor Name and Language:  Subjective: Amber Livingston is a 60 y.o. female accompanied by herself Patient was referred by [name] for mental health Patient reports the following symptoms/concerns: The patient stated that she has not been able to go to physical therapy and missed several sessions due to medical procedures. She notes that as a result she ended up in more pain and stiffened up again. She notes that her physical therapist had to request additional sessions, and she is worried about what will happen if she doesn't get approved for the additional time. She stated that she has an upcoming appointment for a MRI and is concerned that the study will take two hours with her in "the tube." The patient notes that she had to spend five days in bed this past week due to pain. She notes that she has been thinking about applying for SSDI. The patient denies any suicidal or homicidal thoughts.  Duration of problem: ; Severity of problem: moderate  Objective: Mood: Euthymic and Affect: Appropriate Risk of harm to self or others: No plan to harm self or others  Life Context: Family and Social: see above School/Work: see above Self-Care: see above Life Changes: see above  Patient and/or Family's Strengths/Protective Factors: Concrete supports in place (healthy food, safe environments, etc.)  Goals Addressed: Patient will: 1.  Reduce symptoms of: anxiety, depression, insomnia and stress  2.  Increase knowledge and/or ability of: coping skills, healthy habits, self-management skills and stress reduction  3.  Demonstrate ability to: Increase healthy adjustment to current life circumstances  Progress towards Goals: Ongoing  Interventions: Interventions utilized:  Supportive Counseling was  utilized by the clinician during today's follow up session. The clinician processed with the patient how they have been doing since the last follow-up session. The clinician provided a space for the patient to ventilate their frustrations regarding their current life circumstances. Clinician measured the patient's anxiety and depression on a numerical scale. Clinician encouraged the patient to continue to practice self care and to speak to her primary care provider about her unexpected weight loss. Clinician encouraged the patient to take their mediation at the same time everyday exactly as prescribed for it to reach it's full intended effect.  Standardized Assessments completed: GAD-7 and PHQ 9  GAD-7    13 PHQ-9   10   Assessment: Patient currently experiencing see above.   Patient may benefit from see above  Plan: 1. Follow up with behavioral health clinician on : 11/01/2020 at 12:00 PM 2. Behavioral recommendations:  3. Referral(s): [facility] (In Clinic) 4. "From scale of 1-10, how likely are you to follow plan?":   Lesli Albee, Student-Social Work

## 2020-10-12 ENCOUNTER — Other Ambulatory Visit: Payer: Self-pay | Admitting: Gerontology

## 2020-10-12 ENCOUNTER — Encounter: Payer: Self-pay | Admitting: Student in an Organized Health Care Education/Training Program

## 2020-10-12 ENCOUNTER — Other Ambulatory Visit: Payer: Self-pay

## 2020-10-12 ENCOUNTER — Ambulatory Visit: Payer: No Typology Code available for payment source

## 2020-10-12 ENCOUNTER — Ambulatory Visit
Payer: No Typology Code available for payment source | Attending: Student in an Organized Health Care Education/Training Program | Admitting: Student in an Organized Health Care Education/Training Program

## 2020-10-12 VITALS — BP 128/86 | HR 90 | Temp 97.0°F | Ht 69.0 in | Wt 122.0 lb

## 2020-10-12 DIAGNOSIS — M545 Low back pain, unspecified: Secondary | ICD-10-CM | POA: Insufficient documentation

## 2020-10-12 DIAGNOSIS — R531 Weakness: Secondary | ICD-10-CM

## 2020-10-12 DIAGNOSIS — M47816 Spondylosis without myelopathy or radiculopathy, lumbar region: Secondary | ICD-10-CM | POA: Insufficient documentation

## 2020-10-12 DIAGNOSIS — G894 Chronic pain syndrome: Secondary | ICD-10-CM | POA: Insufficient documentation

## 2020-10-12 DIAGNOSIS — F341 Dysthymic disorder: Secondary | ICD-10-CM

## 2020-10-12 DIAGNOSIS — G8929 Other chronic pain: Secondary | ICD-10-CM | POA: Insufficient documentation

## 2020-10-12 DIAGNOSIS — M797 Fibromyalgia: Secondary | ICD-10-CM | POA: Insufficient documentation

## 2020-10-12 DIAGNOSIS — F1210 Cannabis abuse, uncomplicated: Secondary | ICD-10-CM | POA: Insufficient documentation

## 2020-10-12 DIAGNOSIS — M7918 Myalgia, other site: Secondary | ICD-10-CM | POA: Insufficient documentation

## 2020-10-12 MED ORDER — METHYLPREDNISOLONE 4 MG PO TBPK: ORAL_TABLET | ORAL | 0 refills | Status: AC

## 2020-10-12 MED ORDER — TRAZODONE HCL 50 MG PO TABS: 25.0000 mg | ORAL_TABLET | Freq: Every day | ORAL | 0 refills | Status: DC

## 2020-10-12 NOTE — Progress Notes (Signed)
Safety precautions to be maintained throughout the outpatient stay will include: orient to surroundings, keep bed in low position, maintain call bell within reach at all times, provide assistance with transfer out of bed and ambulation.  

## 2020-10-12 NOTE — Progress Notes (Signed)
PROVIDER NOTE: Information contained herein reflects review and annotations entered in association with encounter. Interpretation of such information and data should be left to medically-trained personnel. Information provided to patient can be located elsewhere in the medical record under "Patient Instructions". Document created using STT-dictation technology, any transcriptional errors that may result from process are unintentional.    Patient: Amber Livingston  Service Category: E/M  Provider: Gillis Santa, MD  DOB: 1961/01/28  DOS: 10/12/2020  Specialty: Interventional Pain Management  MRN: [id number]  Setting: Ambulatory outpatient  PCP: Malachy Mood, MD  Type: Established Patient    Referring Provider: Malachy Mood, MD  Location: Office  Delivery: Face-to-face     HPI  Amber Livingston, a 60 y.o. year old female, is here today because of her Fibromyalgia [M79.7]. Amber Livingston primary complain today is Back Pain Last encounter: My last encounter with her was on 08/07/2020. Pertinent problems: Amber Livingston has Chronic back pain; Chronic bilateral low back pain without sciatica; Lumbar facet arthropathy; Myofascial pain; Chronic pain syndrome; Cannabis use disorder, mild, abuse; and Fibromyalgia on their pertinent problem list. Pain Assessment: Severity of Chronic pain is reported as a 3 /10. Location: Back Right,Left,Lower/pain radiaties down to her buttock. Onset: More than a month ago. Quality: Numbness,Shooting,Burning,Aching. Timing: Constant. Modifying factor(s): physical therapy, meds. Vitals:  height is _0  (1.753 m) and weight is 122 lb (55.3 kg). Her temperature is 97 F (36.1 C) (abnormal). Her blood pressure is 128/86 and her pulse is 90. Her oxygen saturation is 100%.   Reason for encounter: worsening of previously known (established) problem   Patient presents today for worsening lumbar lower back pain related to fibromyalgia.  She states that approximately 10 days ago she woke up and  was having increased low back pain.  She states that she has been working with physical therapy for the last 2 months and this is been really helpful for her lumbar musculoskeletal pain.  She is very Programmer, applications Corene Cornea and what he has done for her lumbar spine pain.  She states that she had to interrupt her physical therapy and that she had a new physical therapist.  She states that this interruption in her physical therapy may have set her back and that she is having increased low back pain.  I recommend that she continue with physical therapy.  I informed her that fibromyalgia pain can flareup from stress, diet, weather and reassured her that her symptoms should hopefully improve.  If she continues to have pain even next week, I sent in a Medrol Dosepak that she can try and utilize.  ROS  Constitutional: Denies any fever or chills Gastrointestinal: No reported hemesis, hematochezia, vomiting, or acute GI distress Musculoskeletal: Lumbar spine pain Neurological: No reported episodes of acute onset apraxia, aphasia, dysarthria, agnosia, amnesia, paralysis, loss of coordination, or loss of consciousness  Medication Review  B-12, Bee Pollen, Magnesium Bisglycinate, NON FORMULARY, Turmeric, albuterol, diphenhydramine-acetaminophen, estradiol, medroxyPROGESTERone, methylPREDNISolone, and traZODone  History Review  Allergy: Amber Livingston has No Known Allergies. Drug: Amber Livingston  reports current drug use. Drug: Marijuana. Alcohol:  reports previous alcohol use. Tobacco:  reports that she quit smoking 8 days ago. Her smoking use included cigarettes. She smoked 0.25 packs per day. She has never used smokeless tobacco. Social: Amber Livingston  reports that she quit smoking 8 days ago. Her smoking use included cigarettes. She smoked 0.25 packs per day. She has never used smokeless tobacco. She reports previous alcohol use. She reports current drug use.  Drug: Marijuana. Medical:  has a past medical history of Uterine  fibroid. Surgical: Amber Livingston  has a past surgical history that includes Dilation and curettage of uterus; laparoscopy; Appendectomy; ablation; and PALPITATION. Family: family history includes Brain cancer in her mother; Breast cancer (age of onset: 31) in her maternal grandmother and paternal grandmother; Lung cancer in her mother; Uterine cancer (age of onset: 64) in her mother.  Laboratory Chemistry Profile   Renal Lab Results  Component Value Date   BUN 11 09/27/2020   CREATININE 0.75 09/27/2020   BCR 15 09/27/2020   GFRAA 101 09/27/2020   GFRNONAA 88 09/27/2020     Hepatic Lab Results  Component Value Date   AST 14 03/02/2020   ALT 23 03/02/2020   ALBUMIN 4.7 03/02/2020   ALKPHOS 72 03/02/2020     Electrolytes Lab Results  Component Value Date   NA 141 09/27/2020   K 5.0 09/27/2020   CL 106 09/27/2020   CALCIUM 9.7 09/27/2020     Bone Lab Results  Component Value Date   VD25OH 43.7 07/05/2020     Inflammation (CRP: Acute Phase) (ESR: Chronic Phase) No results found for: CRP, ESRSEDRATE, LATICACIDVEN     Note: Above Lab results reviewed.  Recent Imaging Review  LONG TERM MONITOR (3-14 DAYS)  Patient had a minimum heart rate of 39 bpm, maximum heart rate of 160  bpm, and average heart rate of 66 bpm.  Predominant underlying rhythm was sinus rhythm.  Four runs of supraventricular tachycardia occurred lasting 16 beats at  longest with a max rate of 160 bpm at fastest.  Isolated PACs were rare (<1.0%), with rare couplets present.  Isolated PVCs were rare (<1.0%), with rare couplets and bigeminy  present.  No evidence of complete heart block.  Triggered and diary events associated with sinus rhythm, one episodes of  SVT, and rare PVC.   Occasionally symptomatic SVT. Note: Reviewed        Physical Exam  General appearance: Well nourished, well developed, and well hydrated. In no apparent acute distress Mental status: Alert, oriented x 3 (person,  place, & time)       Respiratory: No evidence of acute respiratory distress Eyes: PERLA Vitals: BP 128/86   Pulse 90   Temp (!) 97 F (36.1 C)   Ht $R'5\' 9"'If$  (1.753 m)   Wt 122 lb (55.3 kg)   SpO2 100%   BMI 18.02 kg/m  BMI: Estimated body mass index is 18.02 kg/m as calculated from the following:   Height as of this encounter: $RemoveBeforeD'5\' 9"'ITTTRRAEDQQJTd$  (1.753 m).   Weight as of this encounter: 122 lb (55.3 kg). Ideal: Ideal body weight: 66.2 kg (145 lb 15.1 oz) Lumbar musculoskeletal pain, pain with facet loading Assessment   Status Diagnosis  Having a Flare-up Having a Flare-up Controlled 1. Fibromyalgia   2. Chronic bilateral low back pain without sciatica   3. Cannabis use disorder, mild, abuse   4. Myofascial pain   5. Lumbar facet arthropathy   6. Chronic pain syndrome      Updated Problems: Problem  Chronic Bilateral Low Back Pain Without Sciatica  Lumbar Facet Arthropathy  Myofascial Pain  Chronic Pain Syndrome  Cannabis Use Disorder, Mild, Abuse  Fibromyalgia  Chronic Back Pain    Plan of Care   Amber Livingston has a current medication list which includes the following long-term medication(s): albuterol, diphenhydramine-acetaminophen, and trazodone.  Pharmacotherapy (Medications Ordered): Meds ordered this encounter  Medications  . methylPREDNISolone (MEDROL)  4 MG TBPK tablet    Sig: Follow package instructions.    Dispense:  21 tablet    Refill:  0    Do not add to the "Automatic Refill" notification system.   Orders:  Orders Placed This Encounter  Procedures  . Ambulatory referral to Physical Therapy    Referral Priority:   Routine    Referral Type:   Physical Medicine    Referral Reason:   Specialty Services Required    Requested Specialty:   Physical Therapy    Number of Visits Requested:   1   Follow-up plan:   No follow-ups on file.   Recent Visits No visits were found meeting these conditions. Showing recent visits within past 90 days and meeting all other  requirements Today's Visits Date Type Provider Dept  10/12/20 Office Visit Gillis Santa, MD [facility]  Showing today's visits and meeting all other requirements Future Appointments No visits were found meeting these conditions. Showing future appointments within next 90 days and meeting all other requirements  I discussed the assessment and treatment plan with the patient. The patient was provided an opportunity to ask questions and all were answered. The patient agreed with the plan and demonstrated an understanding of the instructions.  Patient advised to call back or seek an in-person evaluation if the symptoms or condition worsens.  Duration of encounter: 20 minutes.  Note by: Gillis Santa, MD Date: 10/12/2020; Time: 2:57 PM

## 2020-10-13 NOTE — Therapy (Signed)
[facility] [address] [place], [place], [address] Phone: [phone]   Fax:  [phone]  Physical Therapy Treatment  Patient Details  Name: Amber Livingston MRN: [id number] Date of Birth: 03-25-1961 Referring Provider (PT): Dr. Edward Jolly   Encounter Date: 10/12/2020   PT End of Session - 10/13/20 1052    Visit Number 20    Number of Visits 25    Date for PT Re-Evaluation 10/31/20    PT Start Time 1307    PT Stop Time 1347    PT Time Calculation (min) 40 min    Activity Tolerance Patient tolerated treatment well    Behavior During Therapy [facility] for tasks assessed/performed           Past Medical History:  Diagnosis Date  . Uterine fibroid     Past Surgical History:  Procedure Laterality Date  . ablation    . APPENDECTOMY    . DILATION AND CURETTAGE OF UTERUS     x3 for miscarriage  . LAPAROSCOPY     Fibroid removal  . PALPITATION      There were no vitals filed for this visit.   Subjective Assessment - 10/12/20 1508    Subjective Pt reports that she has had a regression of her symptoms over the last week.  She denies any pain upon arrival today.  She has also had some anxiety provoking medical news with respect to her heart studies.    Pertinent History fibromyalgia    Patient Stated Goals return to work, improve strength and mobility, decrease pain    Currently in Pain? No/denies                TREATMENT   Manual Therapy Moist heat to lumbar spine and seated during history.  Extensive interval history obtained from patient regarding her aggression over the last week with respect to her pain.  Also reviewed her anxieties regarding recent cardiac echo and upcoming cardiac MRI.   STM tolumbar region bilateral paraspinals,quadratus lumborum,and glutMax/med/piriformis muscles utilizingpercussive STM with Hypervolt, effleurage performed to low back, and instrument assisted soft tissue  mobilization. Grade I-II CPA grade mobs L1-L5, 20s/bout x2bouts/level; Grade I-II UPA grade mobs L1-L5 bilateral, 20s/bout x2bouts/level;   Ther-ex Prone straight and bent knee hip extension x10 BLE each; Prone hamstring curls with manual resistance from therapist x10 BLE each;   Pt educated throughout session about proper posture and technique with exercises. Improved exercise technique, movement at target joints, use of target muscles after min to mod verbal, visual, tactile cues.   Patient reporting that she is doing well today however did have a regression of her pain over the course last week.  She has anxieties regarding the results of her cardiac echo and upcoming cardiac MRI as well as anxieties regarding concerns over progression of her pain.  Patient provided with gentle reassurance of the normal fluctuations in pain with respect to fibromyalgia. Continued with prone soft tissue mobilization and lumbar mobilizationstoday. Patient encouraged to continue HEP, monitor for response to treatment today, and follow-up as scheduled. Overall she is making excellent progress with therapy despite intermittent brief setbacks. She has yet to achieve maximal benefit from physical therapy and pt will benefit fromadditionalPT services to address deficits in back pain and improve pain-free function at home.                              PT Long  Term Goals - 10/03/20 1308      PT LONG TERM GOAL #1   Title Pt. will perform 5xSTS in 12 seconds or less to demonstrate improvements in LE strength and functional mobility.    Baseline 11/16: 19.4s; 08/03/20: 11.5s    Time 8    Period Weeks    Status Achieved      PT LONG TERM GOAL #2   Title Pt. will report ability to vaccuum for greater than 10 mins with no increase in back pain over 2/10 to improve pain free functional mobility.    Baseline 11/16: pt is currently not able to vaccuum for 4-5 minutes with back pain  at 5/10; 08/03/20: Pt can vaccuum for 4-5 minutes, no pain but fatigue, rest for a minute and continue. 2/15 pt had to stop 3 times while vacuuming and rest for a minute, reported discomfort with activity. Biggest complaint of numbness    Time 8    Period Weeks    Status Partially Met      PT LONG TERM GOAL #3   Title Pt. will improve FOTO to 50 to demonstrate improvement in pain free functional mobility.    Baseline 11/16: 35; 08/03/20: 63    Time 4    Period Weeks    Status Achieved      PT LONG TERM GOAL #4   Title Pt. independent with HEP to increase B hip strength 1/2 muscle grade to improve standing tolerance/ pain-free household tasks.    Baseline Pt. demonstrates 5/5 strength bilaterally, except for bilat hip IR, ER, and flexion which scored 4/5 for bilat for each. 08/03/20: R/L Hip flexion: 4+/4+, hip extension: 4/4, hip abduction: 4+/4+, hip adduction: 4+/4; deferred to next session    Time 8    Period Weeks    Status Partially Met      PT LONG TERM GOAL #5   Title Patient will report and demonstrate the ability to perform finalized HEP independently, as well as an understanding on how to progress program to continue to maximize strength and function.    Time 4    Period Weeks    Status New    Target Date 10/31/20                 Plan - 10/13/20 1053    Clinical Impression Statement Patient reporting that she is doing well today however did have a regression of her pain over the course last week.  She has anxieties regarding the results of her cardiac echo and upcoming cardiac MRI as well as anxieties regarding concerns over progression of her pain.  Patient provided with gentle reassurance of the normal fluctuations in pain with respect to fibromyalgia. Continued with prone soft tissue mobilization and lumbar mobilizations today. Patient encouraged to continue HEP, monitor for response to treatment today, and follow-up as scheduled. Overall she is making excellent  progress with therapy despite intermittent brief setbacks. She has yet to achieve maximal benefit from physical therapy and pt will benefit from additional PT services to address deficits in back pain and improve pain-free function at home.    Personal Factors and Comorbidities Fitness;Past/Current Experience;Time since onset of injury/illness/exacerbation    Examination-Activity Limitations Bed Mobility;Lift;Stairs;Squat;Stand;Transfers;Sit    Examination-Participation Restrictions Community Activity;Yard Work    Stability/Clinical Decision Making Stable/Uncomplicated    Rehab Potential Fair    PT Frequency 2x / week    PT Duration 8 weeks    PT Treatment/Interventions ADLs/Self Care Home Management;Aquatic Therapy;Cryotherapy;Gait training;Stair  training;Moist Heat;Functional mobility training;Therapeutic activities;Therapeutic exercise;Balance training;Neuromuscular re-education;Patient/family education;Manual techniques;Dry needling;Electrical Stimulation;Spinal Manipulations;Joint Manipulations    PT Next Visit Plan prone manual tx, adominal and low back strengthening;    PT Home Exercise Plan [id number]    Consulted and Agree with Plan of Care Patient           Patient will benefit from skilled therapeutic intervention in order to improve the following deficits and impairments:  Abnormal gait,Decreased activity tolerance,Decreased endurance,Decreased mobility,Decreased range of motion,Hypomobility,Difficulty walking,Decreased strength,Increased muscle spasms,Impaired perceived functional ability,Pain  Visit Diagnosis: Chronic bilateral low back pain, unspecified whether sciatica present  Decreased strength     Problem List Patient Active Problem List   Diagnosis Date Noted  . Palpitations 08/25/2020  . DOE (dyspnea on exertion) 08/25/2020  . Mixed hyperlipidemia 08/25/2020  . History of palpitations 07/05/2020  . Shortness of breath 07/05/2020  . Chronic bilateral low back pain  without sciatica 06/06/2020  . Lumbar facet arthropathy 06/06/2020  . Myofascial pain 06/06/2020  . Chronic pain syndrome 06/06/2020  . Cannabis use disorder, mild, abuse 06/06/2020  . Fibromyalgia 06/06/2020  . Elevated lipids 03/09/2020  . Encounter to establish care 02/22/2020  . Chronic abdominal pain 02/22/2020  . Hot flashes 02/22/2020  . Chronic back pain 02/22/2020  . Anxiety 02/22/2020   Phillips Grout PT, DPT, GCS  Suhayb Anzalone 10/13/2020, 10:58 AM  [place] [facility] [address] [place], [place], [address] Phone: [phone]   Fax:  [phone]  Name: Amber Livingston MRN: [id number] Date of Birth: [birth year]

## 2020-10-17 ENCOUNTER — Other Ambulatory Visit: Payer: Self-pay

## 2020-10-17 ENCOUNTER — Ambulatory Visit
Payer: No Typology Code available for payment source | Attending: Student in an Organized Health Care Education/Training Program

## 2020-10-17 DIAGNOSIS — R531 Weakness: Secondary | ICD-10-CM | POA: Insufficient documentation

## 2020-10-17 DIAGNOSIS — M545 Low back pain, unspecified: Secondary | ICD-10-CM | POA: Insufficient documentation

## 2020-10-17 DIAGNOSIS — R2689 Other abnormalities of gait and mobility: Secondary | ICD-10-CM | POA: Insufficient documentation

## 2020-10-17 DIAGNOSIS — G8929 Other chronic pain: Secondary | ICD-10-CM | POA: Insufficient documentation

## 2020-10-17 NOTE — Therapy (Signed)
[facility] [address] [place], [place], [address] Phone: [phone]   Fax:  [phone]  Physical Therapy Treatment  Patient Details  Name: Amber Livingston MRN: [id number] Date of Birth: [birth year] Referring Provider (PT): Dr. Gillis Santa   Encounter Date: 10/17/2020   PT End of Session - 10/17/20 1307    Visit Number 21    Number of Visits 25    Date for PT Re-Evaluation 10/31/20    PT Start Time 1302    PT Stop Time 1345    PT Time Calculation (min) 43 min    Activity Tolerance Patient tolerated treatment well    Behavior During Therapy [facility] for tasks assessed/performed           Past Medical History:  Diagnosis Date  . Uterine fibroid     Past Surgical History:  Procedure Laterality Date  . ablation    . APPENDECTOMY    . DILATION AND CURETTAGE OF UTERUS     x3 for miscarriage  . LAPAROSCOPY     Fibroid removal  . PALPITATION      There were no vitals filed for this visit.   Subjective Assessment - 10/17/20 1305    Subjective Patient reported she doing well today. Pt reported that she had pain this morning in her R hip. But did what she needd to do. Reported after her last PT visit her pain was better but does complain of cervical pain as well.    Pertinent History fibromyalgia    Patient Stated Goals return to work, improve strength and mobility, decrease pain    Currently in Pain? No/denies           TREATMENT Manual Therapy Moist heat to lumbar spine and seated during history. STM tolumbar region bilateral paraspinals,quadratus lumborum,and glutMax/med/piriformis muscles utilizingpercussive STM with Hypervolt, effleurage performed to low back, and instrument assisted soft tissue mobilization. Grade I-II CPA grade mobs L1-L5, 20s/bout x2bouts/level; Grade I-II UPA grade mobs L1-L5 bilateral, 20s/bout x2bouts/level;   Ther-ex Prone straight and bent knee hip extension x10 BLE each; Prone  hamstring curls with manual resistance from therapist x10 BLE each; sidelying hip abduction 2x10 bilaterally, manual resistance from therapist  childs pose with over pressure 2x30 seconds  Pt educated throughout session about proper posture and technique with exercises. Improved exercise technique, movement at target joints, use of target muscles after min to mod verbal, visual, tactile cues.  Pt response/clinical impression: The patient reported and demonstrated continued stiffness and hypomobility of lumbar spine, as well as concordant pain with deep tissue mobilization of glute med/superior max. The patient was encouraged to return to HEP as able to continue to make progress at home and in preparation in self management of condition. The patient would benefit from further skilled PT intervention to continue to progress towards goals.      PT Education - 10/17/20 1306    Education Details therex    Person(s) Educated Patient    Methods Explanation;Demonstration;Tactile cues;Verbal cues    Comprehension Verbalized understanding;Returned demonstration;Verbal cues required;Tactile cues required;Need further instruction               PT Long Term Goals - 10/03/20 1308      PT LONG TERM GOAL #1   Title Pt. will perform 5xSTS in 12 seconds or less to demonstrate improvements in LE strength and functional mobility.    Baseline 11/16: 19.4s; 08/03/20: 11.5s    Time 8    Period Weeks  Status Achieved      PT LONG TERM GOAL #2   Title Pt. will report ability to vaccuum for greater than 10 mins with no increase in back pain over 2/10 to improve pain free functional mobility.    Baseline 11/16: pt is currently not able to vaccuum for 4-5 minutes with back pain at 5/10; 08/03/20: Pt can vaccuum for 4-5 minutes, no pain but fatigue, rest for a minute and continue. 2/15 pt had to stop 3 times while vacuuming and rest for a minute, reported discomfort with activity. Biggest complaint of  numbness    Time 8    Period Weeks    Status Partially Met      PT LONG TERM GOAL #3   Title Pt. will improve FOTO to 50 to demonstrate improvement in pain free functional mobility.    Baseline 11/16: 35; 08/03/20: 63    Time 4    Period Weeks    Status Achieved      PT LONG TERM GOAL #4   Title Pt. independent with HEP to increase B hip strength 1/2 muscle grade to improve standing tolerance/ pain-free household tasks.    Baseline Pt. demonstrates 5/5 strength bilaterally, except for bilat hip IR, ER, and flexion which scored 4/5 for bilat for each. 08/03/20: R/L Hip flexion: 4+/4+, hip extension: 4/4, hip abduction: 4+/4+, hip adduction: 4+/4; deferred to next session    Time 8    Period Weeks    Status Partially Met      PT LONG TERM GOAL #5   Title Patient will report and demonstrate the ability to perform finalized HEP independently, as well as an understanding on how to progress program to continue to maximize strength and function.    Time 4    Period Weeks    Status New    Target Date 10/31/20                 Plan - 10/17/20 1306    Clinical Impression Statement The patient reported and demonstrated continued stiffness and hypomobility of lumbar spine, as well as concordant pain with deep tissue mobilization of glute med/superior max. The patient was encouraged to return to HEP as able to continue to make progress at home and in preparation in self management of condition. The patient would benefit from further skilled PT intervention to continue to progress towards goals.    Personal Factors and Comorbidities Fitness;Past/Current Experience;Time since onset of injury/illness/exacerbation    Examination-Activity Limitations Bed Mobility;Lift;Stairs;Squat;Stand;Transfers;Sit    Examination-Participation Restrictions Community Activity;Yard Work    Stability/Clinical Decision Making Stable/Uncomplicated    Rehab Potential Fair    PT Frequency 2x / week    PT Duration  8 weeks    PT Treatment/Interventions ADLs/Self Care Home Management;Aquatic Therapy;Cryotherapy;Gait training;Stair training;Moist Heat;Functional mobility training;Therapeutic activities;Therapeutic exercise;Balance training;Neuromuscular re-education;Patient/family education;Manual techniques;Dry needling;Electrical Stimulation;Spinal Manipulations;Joint Manipulations    PT Next Visit Plan prone manual tx, adominal and low back strengthening;    PT Home Exercise Plan [id number]    Consulted and Agree with Plan of Care Patient           Patient will benefit from skilled therapeutic intervention in order to improve the following deficits and impairments:  Abnormal gait,Decreased activity tolerance,Decreased endurance,Decreased mobility,Decreased range of motion,Hypomobility,Difficulty walking,Decreased strength,Increased muscle spasms,Impaired perceived functional ability,Pain  Visit Diagnosis: Chronic bilateral low back pain, unspecified whether sciatica present  Decreased strength  Decreased mobility     Problem List Patient Active Problem List   Diagnosis  Date Noted  . Palpitations 08/25/2020  . DOE (dyspnea on exertion) 08/25/2020  . Mixed hyperlipidemia 08/25/2020  . History of palpitations 07/05/2020  . Shortness of breath 07/05/2020  . Chronic bilateral low back pain without sciatica 06/06/2020  . Lumbar facet arthropathy 06/06/2020  . Myofascial pain 06/06/2020  . Chronic pain syndrome 06/06/2020  . Cannabis use disorder, mild, abuse 06/06/2020  . Fibromyalgia 06/06/2020  . Elevated lipids 03/09/2020  . Encounter to establish care 02/22/2020  . Chronic abdominal pain 02/22/2020  . Hot flashes 02/22/2020  . Chronic back pain 02/22/2020  . Anxiety 02/22/2020    Lieutenant Diego PT, DPT 1:48 PM,10/17/20   [facility] [facility] [address] [place], [place], [address] Phone: [phone]   Fax:  [phone]  Name: Amber Livingston MRN: [id number] Date of Birth: 1960/10/06

## 2020-10-19 ENCOUNTER — Ambulatory Visit: Payer: No Typology Code available for payment source

## 2020-10-19 ENCOUNTER — Other Ambulatory Visit: Payer: Self-pay

## 2020-10-19 DIAGNOSIS — R531 Weakness: Secondary | ICD-10-CM

## 2020-10-19 DIAGNOSIS — G8929 Other chronic pain: Secondary | ICD-10-CM

## 2020-10-19 NOTE — Therapy (Signed)
[facility] [address] [place], [place], [address] Phone: [phone]   Fax:  [phone]  Physical Therapy Treatment  Patient Details  Name: Amber Livingston MRN: [id number] Date of Birth: September 08, 1960 Referring Provider (PT): Dr. Gillis Santa   Encounter Date: 10/19/2020   PT End of Session - 10/19/20 1514    Visit Number 22    Number of Visits 25    Date for PT Re-Evaluation 10/31/20    PT Start Time 1300    PT Stop Time 1343    PT Time Calculation (min) 43 min    Activity Tolerance Patient tolerated treatment well    Behavior During Therapy [facility] for tasks assessed/performed           Past Medical History:  Diagnosis Date  . Uterine fibroid     Past Surgical History:  Procedure Laterality Date  . ablation    . APPENDECTOMY    . DILATION AND CURETTAGE OF UTERUS     x3 for miscarriage  . LAPAROSCOPY     Fibroid removal  . PALPITATION      There were no vitals filed for this visit.   Subjective Assessment - 10/19/20 1513    Subjective Patient reports she is doing well today. Back pain and hip pain are once again improving. Numbness is also improving. No specific questions or concerns upon arrival.    Pertinent History fibromyalgia    Patient Stated Goals return to work, improve strength and mobility, decrease pain    Currently in Pain? No/denies               TREATMENT   Manual Therapy Moist heat to lumbar spine in prone during interval history; STM tolumbar region bilateral paraspinals,quadratus lumborum,and glutMax/med/piriformis muscles utilizingpercussive STM with Hypervolt, effleurage performed to low back, and instrument assisted soft tissue mobilization. Grade I-II CPA grade mobs L1-L5, 20s/bout x2bouts/level; Grade I-II UPA grade mobs L1-L5 bilateral, 20s/bout x2bouts/level;   Ther-ex Prone straight and bent knee hip extension x20 BLE each; Prone hamstring curls with manual  resistance from therapist x10 BLE each; Sidelying hip abduction x 10 BLE; Sidelying clams with manual resistance from therapist x 10 BLE; Sidelying reverse clams with manual resistance from therapist x 10 BLE;   Pt educated throughout session about proper posture and technique with exercises. Improved exercise technique, movement at target joints, use of target muscles after min to mod verbal, visual, tactile cues.   Patient reporting that she is doing well today and once again feels like her symptoms are improving. Continued with prone soft tissue mobilization and lumbar mobilizationstoday during session. Reinitiated abdominal, low back, and hip strengthening to help with long-term management of back pain. Pt is able to complete exercises today without any increase in her pain. Patient encouraged to continue HEP, monitor for response to treatment today, and follow-up as scheduled. Overall she is making excellent progress with therapy despite intermittent brief setbacks. She has yet to achieve maximal benefit from physical therapy and pt will benefit fromadditionalPT services to address deficits in back pain and improve pain-free function at home.                              PT Long Term Goals - 10/03/20 1308      PT LONG TERM GOAL #1   Title Pt. will perform 5xSTS in 12 seconds or less to demonstrate improvements in LE strength and functional  mobility.    Baseline 11/16: 19.4s; 08/03/20: 11.5s    Time 8    Period Weeks    Status Achieved      PT LONG TERM GOAL #2   Title Pt. will report ability to vaccuum for greater than 10 mins with no increase in back pain over 2/10 to improve pain free functional mobility.    Baseline 11/16: pt is currently not able to vaccuum for 4-5 minutes with back pain at 5/10; 08/03/20: Pt can vaccuum for 4-5 minutes, no pain but fatigue, rest for a minute and continue. 2/15 pt had to stop 3 times while vacuuming and rest for a  minute, reported discomfort with activity. Biggest complaint of numbness    Time 8    Period Weeks    Status Partially Met      PT LONG TERM GOAL #3   Title Pt. will improve FOTO to 50 to demonstrate improvement in pain free functional mobility.    Baseline 11/16: 35; 08/03/20: 63    Time 4    Period Weeks    Status Achieved      PT LONG TERM GOAL #4   Title Pt. independent with HEP to increase B hip strength 1/2 muscle grade to improve standing tolerance/ pain-free household tasks.    Baseline Pt. demonstrates 5/5 strength bilaterally, except for bilat hip IR, ER, and flexion which scored 4/5 for bilat for each. 08/03/20: R/L Hip flexion: 4+/4+, hip extension: 4/4, hip abduction: 4+/4+, hip adduction: 4+/4; deferred to next session    Time 8    Period Weeks    Status Partially Met      PT LONG TERM GOAL #5   Title Patient will report and demonstrate the ability to perform finalized HEP independently, as well as an understanding on how to progress program to continue to maximize strength and function.    Time 4    Period Weeks    Status New    Target Date 10/31/20                 Plan - 10/19/20 1514    Clinical Impression Statement Patient reporting that she is doing well today and once again feels like her symptoms are improving. Continued with prone soft tissue mobilization and lumbar mobilizations today during session. Reinitiated abdominal, low back, and hip strengthening to help with long-term management of back pain. Pt is able to complete exercises today without any increase in her pain. Patient encouraged to continue HEP, monitor for response to treatment today, and follow-up as scheduled. Overall she is making excellent progress with therapy despite intermittent brief setbacks. She has yet to achieve maximal benefit from physical therapy and pt will benefit from additional PT services to address deficits in back pain and improve pain-free function at home.    Personal  Factors and Comorbidities Fitness;Past/Current Experience;Time since onset of injury/illness/exacerbation    Examination-Activity Limitations Bed Mobility;Lift;Stairs;Squat;Stand;Transfers;Sit    Examination-Participation Restrictions Community Activity;Yard Work    Stability/Clinical Decision Making Stable/Uncomplicated    Rehab Potential Fair    PT Frequency 2x / week    PT Duration 8 weeks    PT Treatment/Interventions ADLs/Self Care Home Management;Aquatic Therapy;Cryotherapy;Gait training;Stair training;Moist Heat;Functional mobility training;Therapeutic activities;Therapeutic exercise;Balance training;Neuromuscular re-education;Patient/family education;Manual techniques;Dry needling;Electrical Stimulation;Spinal Manipulations;Joint Manipulations    PT Next Visit Plan prone manual tx, adominal and low back strengthening;    PT Home Exercise Plan [email or web address]    Consulted and Agree with Plan of Care Patient  Patient will benefit from skilled therapeutic intervention in order to improve the following deficits and impairments:  Abnormal gait,Decreased activity tolerance,Decreased endurance,Decreased mobility,Decreased range of motion,Hypomobility,Difficulty walking,Decreased strength,Increased muscle spasms,Impaired perceived functional ability,Pain  Visit Diagnosis: Chronic bilateral low back pain, unspecified whether sciatica present  Decreased strength     Problem List Patient Active Problem List   Diagnosis Date Noted  . Palpitations 08/25/2020  . DOE (dyspnea on exertion) 08/25/2020  . Mixed hyperlipidemia 08/25/2020  . History of palpitations 07/05/2020  . Shortness of breath 07/05/2020  . Chronic bilateral low back pain without sciatica 06/06/2020  . Lumbar facet arthropathy 06/06/2020  . Myofascial pain 06/06/2020  . Chronic pain syndrome 06/06/2020  . Cannabis use disorder, mild, abuse 06/06/2020  . Fibromyalgia 06/06/2020  . Elevated lipids 03/09/2020  .  Encounter to establish care 02/22/2020  . Chronic abdominal pain 02/22/2020  . Hot flashes 02/22/2020  . Chronic back pain 02/22/2020  . Anxiety 02/22/2020   Phillips Grout PT, DPT, GCS  Bulah Lurie 10/19/2020, 8:08 PM  [facility] [address] [place], [place], [address] Phone: [phone]   Fax:  [phone]  Name: Mikiala Fugett MRN: [id number] Date of Birth: [birth year]

## 2020-10-23 ENCOUNTER — Ambulatory Visit: Payer: Self-pay | Admitting: Internal Medicine

## 2020-10-23 ENCOUNTER — Telehealth (HOSPITAL_COMMUNITY): Payer: Self-pay | Admitting: Emergency Medicine

## 2020-10-23 ENCOUNTER — Ambulatory Visit: Payer: No Typology Code available for payment source

## 2020-10-23 ENCOUNTER — Other Ambulatory Visit: Payer: Self-pay

## 2020-10-23 DIAGNOSIS — M545 Low back pain, unspecified: Secondary | ICD-10-CM

## 2020-10-23 DIAGNOSIS — R531 Weakness: Secondary | ICD-10-CM

## 2020-10-23 NOTE — Therapy (Signed)
[facility] [address] [place], [place], [address] Phone: [phone]   Fax:  [phone]  Physical Therapy Treatment  Patient Details  Name: Amber Livingston MRN: [id number] Date of Birth: 1961-03-18 Referring Provider (PT): Dr. Gillis Santa   Encounter Date: 10/23/2020   PT End of Session - 10/23/20 1306    Visit Number 23    Number of Visits 25    Date for PT Re-Evaluation 10/31/20    PT Start Time 1219    PT Stop Time 1345    PT Time Calculation (min) 42 min    Activity Tolerance Patient tolerated treatment well    Behavior During Therapy [facility] for tasks assessed/performed           Past Medical History:  Diagnosis Date  . Uterine fibroid     Past Surgical History:  Procedure Laterality Date  . ablation    . APPENDECTOMY    . DILATION AND CURETTAGE OF UTERUS     x3 for miscarriage  . LAPAROSCOPY     Fibroid removal  . PALPITATION      There were no vitals filed for this visit.   Subjective Assessment - 10/23/20 1306    Subjective Patient reports she is doing alright today. Back pain and hip pain continue to improve. She woke up with 1/10 R hip pain. Numbness is also improving. No specific questions or concerns upon arrival.    Pertinent History fibromyalgia    Patient Stated Goals return to work, improve strength and mobility, decrease pain    Currently in Pain? Yes    Pain Score 1     Pain Location Back    Pain Orientation Right;Left    Pain Descriptors / Indicators Aching;Shooting    Pain Type Chronic pain    Pain Onset More than a month ago    Pain Frequency Constant               TREATMENT   Manual Therapy Moist heat to lumbar spine in prone during interval history; STM tolumbar region bilateral paraspinals,quadratus lumborum,and glutMax/med/piriformis muscles utilizingpercussive STM with Hypervolt,; Grade I-II CPA grade mobs L1-L5, 20s/bout x2bouts/level; Grade I-II UPA grade mobs  L1-L5 bilateral, 20s/bout x2bouts/level;   Ther-ex Prone straight and bent knee hip extension x20 BLE each; Prone hamstring curls with manual resistance from therapist x10 BLE each; Quadruped bird dogs x 10 bilateral; Quadruped cat cows x 5 each direction; Quadruped fire hydrants x 10 on each side; Childs pose stretch with overpressure by therapist x 30s; Forearm knee planks 20s hold/15s rest x 5, one additional 11s hold;   Pt educated throughout session about proper posture and technique with exercises. Improved exercise technique, movement at target joints, use of target muscles after min to mod verbal, visual, tactile cues.   Patient reporting that she is doing well today and once again feels like her symptoms are continuing to improve. Continued with prone soft tissue mobilization and lumbar mobilizationstoday during session. Also continued abdominal, low back, and hip strengthening to help with long-term management of back pain.  Patient was able to perform more challenging exercises during session today.  He denies any increase in her pain during exercise. Patient encouraged to continue HEP, monitor for response to treatment today, and follow-up as scheduled. Overall she is makingexcellentprogress with therapy despite intermittent brief setbacks. She has yet to achieve maximal benefit from physical therapy and pt will benefit fromadditionalPT services to address deficits in back pain and  improve pain-free function at home.                              PT Long Term Goals - 10/03/20 1308      PT LONG TERM GOAL #1   Title Pt. will perform 5xSTS in 12 seconds or less to demonstrate improvements in LE strength and functional mobility.    Baseline 11/16: 19.4s; 08/03/20: 11.5s    Time 8    Period Weeks    Status Achieved      PT LONG TERM GOAL #2   Title Pt. will report ability to vaccuum for greater than 10 mins with no increase in back pain  over 2/10 to improve pain free functional mobility.    Baseline 11/16: pt is currently not able to vaccuum for 4-5 minutes with back pain at 5/10; 08/03/20: Pt can vaccuum for 4-5 minutes, no pain but fatigue, rest for a minute and continue. 2/15 pt had to stop 3 times while vacuuming and rest for a minute, reported discomfort with activity. Biggest complaint of numbness    Time 8    Period Weeks    Status Partially Met      PT LONG TERM GOAL #3   Title Pt. will improve FOTO to 50 to demonstrate improvement in pain free functional mobility.    Baseline 11/16: 35; 08/03/20: 63    Time 4    Period Weeks    Status Achieved      PT LONG TERM GOAL #4   Title Pt. independent with HEP to increase B hip strength 1/2 muscle grade to improve standing tolerance/ pain-free household tasks.    Baseline Pt. demonstrates 5/5 strength bilaterally, except for bilat hip IR, ER, and flexion which scored 4/5 for bilat for each. 08/03/20: R/L Hip flexion: 4+/4+, hip extension: 4/4, hip abduction: 4+/4+, hip adduction: 4+/4; deferred to next session    Time 8    Period Weeks    Status Partially Met      PT LONG TERM GOAL #5   Title Patient will report and demonstrate the ability to perform finalized HEP independently, as well as an understanding on how to progress program to continue to maximize strength and function.    Time 4    Period Weeks    Status New    Target Date 10/31/20                 Plan - 10/23/20 1307    Clinical Impression Statement Patient reporting that she is doing well today and once again feels like her symptoms are continuing to improve. Continued with prone soft tissue mobilization and lumbar mobilizations today during session. Also continued abdominal, low back, and hip strengthening to help with long-term management of back pain.  Patient was able to perform more challenging exercises during session today.  He denies any increase in her pain during exercise. Patient  encouraged to continue HEP, monitor for response to treatment today, and follow-up as scheduled. Overall she is making excellent progress with therapy despite intermittent brief setbacks. She has yet to achieve maximal benefit from physical therapy and pt will benefit from additional PT services to address deficits in back pain and improve pain-free function at home.    Personal Factors and Comorbidities Fitness;Past/Current Experience;Time since onset of injury/illness/exacerbation    Examination-Activity Limitations Bed Mobility;Lift;Stairs;Squat;Stand;Transfers;Sit    Examination-Participation Restrictions Community Activity;Yard Work    Stability/Clinical Decision Making Stable/Uncomplicated  Rehab Potential Fair    PT Frequency 2x / week    PT Duration 8 weeks    PT Treatment/Interventions ADLs/Self Care Home Management;Aquatic Therapy;Cryotherapy;Gait training;Stair training;Moist Heat;Functional mobility training;Therapeutic activities;Therapeutic exercise;Balance training;Neuromuscular re-education;Patient/family education;Manual techniques;Dry needling;Electrical Stimulation;Spinal Manipulations;Joint Manipulations    PT Next Visit Plan prone manual tx, adominal and low back strengthening;    PT Home Exercise Plan [id number]    Consulted and Agree with Plan of Care Patient           Patient will benefit from skilled therapeutic intervention in order to improve the following deficits and impairments:  Abnormal gait,Decreased activity tolerance,Decreased endurance,Decreased mobility,Decreased range of motion,Hypomobility,Difficulty walking,Decreased strength,Increased muscle spasms,Impaired perceived functional ability,Pain  Visit Diagnosis: Chronic bilateral low back pain, unspecified whether sciatica present  Decreased strength     Problem List Patient Active Problem List   Diagnosis Date Noted  . Palpitations 08/25/2020  . DOE (dyspnea on exertion) 08/25/2020  . Mixed  hyperlipidemia 08/25/2020  . History of palpitations 07/05/2020  . Shortness of breath 07/05/2020  . Chronic bilateral low back pain without sciatica 06/06/2020  . Lumbar facet arthropathy 06/06/2020  . Myofascial pain 06/06/2020  . Chronic pain syndrome 06/06/2020  . Cannabis use disorder, mild, abuse 06/06/2020  . Fibromyalgia 06/06/2020  . Elevated lipids 03/09/2020  . Encounter to establish care 02/22/2020  . Chronic abdominal pain 02/22/2020  . Hot flashes 02/22/2020  . Chronic back pain 02/22/2020  . Anxiety 02/22/2020   Phillips Grout PT, DPT, GCS  Aline Wesche 10/23/2020, 3:22 PM  [facility] [address] [place], [place], [address] Phone: [phone]   Fax:  [phone]  Name: Mable Dara MRN: [id number] Date of Birth: 1961-02-04

## 2020-10-23 NOTE — Telephone Encounter (Signed)
Attempted to call patient regarding upcoming cardiac MR appointment. Left message on voicemail with name and callback number Jomaira Darr RN Navigator Cardiac Imaging [facility] [phone] Office [phone] Cell  

## 2020-10-23 NOTE — Telephone Encounter (Signed)
Reaching out to patient to offer assistance regarding upcoming cardiac imaging study; pt verbalizes understanding of appt date/time, parking situation and where to check in, and verified current allergies; name and call back number provided for further questions should they arise Marchia Bond RN [facility] [phone] office [phone] cell   Pt denies implants, reports some claustro (will take trazodone prior to scan- states she does have a safe ride) Clarise Cruz

## 2020-10-24 ENCOUNTER — Encounter: Payer: Self-pay | Admitting: Gerontology

## 2020-10-24 ENCOUNTER — Telehealth: Payer: Self-pay | Admitting: *Deleted

## 2020-10-24 ENCOUNTER — Ambulatory Visit: Payer: Self-pay | Admitting: Gerontology

## 2020-10-24 VITALS — BP 120/55 | HR 70 | Temp 97.1°F | Resp 18 | Wt 121.5 lb

## 2020-10-24 DIAGNOSIS — F17200 Nicotine dependence, unspecified, uncomplicated: Secondary | ICD-10-CM

## 2020-10-24 DIAGNOSIS — R0600 Dyspnea, unspecified: Secondary | ICD-10-CM

## 2020-10-24 DIAGNOSIS — F419 Anxiety disorder, unspecified: Secondary | ICD-10-CM

## 2020-10-24 DIAGNOSIS — M797 Fibromyalgia: Secondary | ICD-10-CM

## 2020-10-24 DIAGNOSIS — R0609 Other forms of dyspnea: Secondary | ICD-10-CM

## 2020-10-24 DIAGNOSIS — Z7189 Other specified counseling: Secondary | ICD-10-CM | POA: Insufficient documentation

## 2020-10-24 DIAGNOSIS — E782 Mixed hyperlipidemia: Secondary | ICD-10-CM

## 2020-10-24 NOTE — Patient Instructions (Addendum)
Shortness of Breath, Adult Shortness of breath means you have trouble breathing. Shortness of breath could be a sign of a medical problem. Follow these instructions at home:  Watch for any changes in your symptoms. Do not use any products that contain nicotine or t Preventing High Cholesterol Cholesterol is a white, waxy substance similar to fat that the human body needs to help build cells. The liver makes all the cholesterol that a person's body needs. Having high cholesterol (hypercholesterolemia) increases your risk for heart disease and stroke. Extra or excess cholesterol comes from the food that you eat. High cholesterol can often be prevented with diet and lifestyle changes. If you already have high cholesterol, you can control it with diet, lifestyle changes, and medicines. How can high cholesterol affect me? If you have high cholesterol, fatty deposits (plaques) may build up on the walls of your blood vessels. The blood vessels that carry blood away from your heart are called arteries. Plaques make the arteries narrower and stiffer. This in turn can: Restrict or block blood flow and cause blood clots to form. Increase your risk for heart attack and stroke. What can increase my risk for high cholesterol? This condition is more likely to develop in people who: Eat foods that are high in saturated fat or cholesterol. Saturated fat is mostly found in foods that come from animal sources. Are overweight. Are not getting enough exercise. Have a family history of high cholesterol (familial hypercholesterolemia). What actions can I take to prevent this? Nutrition Eat less saturated fat. Avoid trans fats (partially hydrogenated oils). These are often found in margarine and in some baked goods, fried foods, and snacks bought in packages. Avoid precooked or cured meat, such as bacon, sausages, or meat loaves. Avoid foods and drinks that have added sugars. Eat more fruits, vegetables, and whole  grains. Choose healthy sources of protein, such as fish, poultry, lean cuts of red meat, beans, peas, lentils, and nuts. Choose healthy sources of fat, such as: Nuts. Vegetable oils, especially olive oil. Fish that have healthy fats, such as omega-3 fatty acids. These fish include mackerel or salmon.   Lifestyle Lose weight if you are overweight. Maintaining a healthy body mass index (BMI) can help prevent or control high cholesterol. It can also lower your risk for diabetes and high blood pressure. Ask your health care provider to help you with a diet and exercise plan to lose weight safely. Do not use any products that contain nicotine or tobacco, such as cigarettes, e-cigarettes, and chewing tobacco. If you need help quitting, ask your health care provider. Alcohol use Do not drink alcohol if: Your health care provider tells you not to drink. You are pregnant, may be pregnant, or are planning to become pregnant. If you drink alcohol: Limit how much you use to: 0-1 drink a day for women. 0-2 drinks a day for men. Be aware of how much alcohol is in your drink. In the [place] one drink equals one 12 oz bottle of beer (355 mL), one 5 oz glass of wine (148 mL), or one 1 oz glass of hard liquor (44 mL). Activity Get enough exercise. Do exercises as told by your health care provider. Each week, do at least 150 minutes of exercise that takes a medium level of effort (moderate-intensity exercise). This kind of exercise: Makes your heart beat faster while allowing you to still be able to talk. Can be done in short sessions several times a day or longer sessions a few  times a week. For example, on 5 days each week, you could walk fast or ride your bike 3 times a day for 10 minutes each time.   Medicines Your health care provider may recommend medicines to help lower cholesterol. This may be a medicine to lower the amount of cholesterol that your liver makes. You may need medicine if: Diet and  lifestyle changes have not lowered your cholesterol enough. You have high cholesterol and other risk factors for heart disease or stroke. Take over-the-counter and prescription medicines only as told by your health care provider. General information Manage your risk factors for high cholesterol. Talk with your health care provider about all your risk factors and how to lower your risk. Manage other conditions that you have, such as diabetes or high blood pressure (hypertension). Have blood tests to check your cholesterol levels at regular points in time as told by your health care provider. Keep all follow-up visits as told by your health care provider. This is important. Where to find more information American Heart Association: www.heart.org National Heart, Lung, and Blood Institute: https://wilson-eaton.com/ Summary High cholesterol increases your risk for heart disease and stroke. By keeping your cholesterol level low, you can reduce your risk for these conditions. High cholesterol can often be prevented with diet and lifestyle changes. Work with your health care provider to manage your risk factors, and have your blood tested regularly. This information is not intended to replace advice given to you by your health care provider. Make sure you discuss any questions you have with your health care provider. Document Revised: 05/18/2019 Document Reviewed: 05/18/2019 Elsevier Patient Education  2021 [employer], such as cigarettes, e-cigarettes, and chewing tobacco.  Do not smoke. Smoking can cause shortness of breath. If you need help to quit smoking, ask your doctor.  Avoid things that can make it harder to breathe, such as: ? Mold. ? Dust. ? Air pollution. ? Chemical smells. ? Things that can cause allergy symptoms (allergens), if you have allergies.  Keep your living space clean. Use products that help remove mold and dust.  Rest as needed. Slowly return to your normal  activities.  Take over-the-counter and prescription medicines only as told by your doctor. This includes oxygen therapy and inhaled medicines.  Keep all follow-up visits as told by your doctor. This is important.   Contact a doctor if:  Your condition does not get better as soon as expected.  You have a hard time doing your normal activities, even after you rest.  You have new symptoms. Get help right away if:  Your shortness of breath gets worse.  You have trouble breathing when you are resting.  You feel light-headed or you pass out (faint).  You have a cough that is not helped by medicines.  You cough up blood.  You have pain with breathing.  You have pain in your chest, arms, shoulders, or belly (abdomen).  You have a fever.  You cannot walk up stairs.  You cannot exercise the way you normally do. These symptoms may represent a serious problem that is an emergency. Do not wait to see if the symptoms will go away. Get medical help right away. Call your local emergency services (911 in the [place] Do not drive yourself to the hospital. Summary  Shortness of breath is when you have trouble breathing enough air. It can be a sign of a medical problem.  Avoid things that make it hard for you to breathe, such  as smoking, pollution, mold, and dust.  Watch for any changes in your symptoms. Contact your doctor if you do not get better or you get worse. This information is not intended to replace advice given to you by your health care provider. Make sure you discuss any questions you have with your health care provider. Document Revised: 01/05/2018 Document Reviewed: 01/05/2018 Elsevier Patient Education  2021 [employer].

## 2020-10-24 NOTE — Progress Notes (Signed)
Established Patient Office Visit  Subjective:  Patient ID: Amber Livingston, female    DOB: 1961-08-01  Age: 60 y.o. MRN: [id number]  CC: Follow up for anxiety, HLD, and dyspnea  HPI Amber Livingston is a 60 year old female who presents for follow up of HLD, anxiety, and dyspnea upon exertion. She is currently being followed and evaluated by cardiology for palpitations and SOB. She reports she has a cardiac MRI scheduled for tomorrow. She denies any worsening of these symptoms. She is also managed by pain management for her fibromyalgia, which she feels has improved with weight loss and PT. She reports compliance with her current medications and continues to make healthy lifestyle choices. She denies any CP, swelling in her lower extremities, fever, or cough. She has lost approx. 35 lb since July with intentional diet modifications. Her previous lipid panel obtained in November was improved with total cholesterol 208 from 249 and LDL 132 from 168. She does report continued smoking, although she had quit for 6 weeks up until yesterday. Her mood is stable and feels as though [place] counseling has improved her anxiety. She denies any SI/HI. Overall, she is concerned about her heart and breathing but offers no further complaints.   Past Medical History:  Diagnosis Date  . Uterine fibroid     Past Surgical History:  Procedure Laterality Date  . ablation    . APPENDECTOMY    . DILATION AND CURETTAGE OF UTERUS     x3 for miscarriage  . LAPAROSCOPY     Fibroid removal  . PALPITATION      Family History  Problem Relation Age of Onset  . Uterine cancer Mother 58  . Lung cancer Mother   . Brain cancer Mother   . Breast cancer Maternal Grandmother 60  . Breast cancer Paternal Grandmother 27    Social History   Socioeconomic History  . Marital status: Married    Spouse name: Not on file  . Number of children: Not on file  . Years of education: Not on file  . Highest education level: Not on file   Occupational History  . Not on file  Tobacco Use  . Smoking status: Former Smoker    Packs/day: 0.25    Types: Cigarettes    Quit date: 10/04/2020    Years since quitting: 0.0  . Smokeless tobacco: Never Used  Vaping Use  . Vaping Use: Never used  Substance and Sexual Activity  . Alcohol use: Not Currently    Comment: Occ  . Drug use: Yes    Types: Marijuana  . Sexual activity: Yes    Partners: Male  Other Topics Concern  . Not on file  Social History Narrative  . Not on file   Social Determinants of Health   Financial Resource Strain: High Risk  . Difficulty of Paying Living Expenses: Very hard  Food Insecurity: Food Insecurity Present  . Worried About Charity fundraiser in the Last Year: Sometimes true  . Ran Out of Food in the Last Year: Never true  Transportation Needs: No Transportation Needs  . Lack of Transportation (Medical): No  . Lack of Transportation (Non-Medical): No  Physical Activity: Inactive  . Days of Exercise per Week: 0 days  . Minutes of Exercise per Session: 0 min  Stress: Stress Concern Present  . Feeling of Stress : To some extent  Social Connections: Socially Isolated  . Frequency of Communication with Friends and Family: Twice a week  . Frequency  of Social Gatherings with Friends and Family: Never  . Attends Religious Services: Never  . Active Member of Clubs or Organizations: No  . Attends Archivist Meetings: Never  . Marital Status: Never married  Intimate Partner Violence: Not on file    Outpatient Medications Prior to Visit  Medication Sig Dispense Refill  . albuterol (VENTOLIN HFA) 108 (90 Base) MCG/ACT inhaler Inhale 2 puffs into the lungs every 6 (six) hours as needed for wheezing or shortness of breath. 1 each 2  . BEE POLLEN PO Take 1 capsule by mouth daily.    . Cyanocobalamin (B-12) 2500 MCG TABS Take by mouth daily.    . diphenhydramine-acetaminophen (TYLENOL PM) 25-500 MG TABS tablet Take 2 tablets by mouth at  bedtime as needed.    Marland Kitchen estradiol (ESTRACE) 0.5 MG tablet Take 0.5 mg by mouth daily.    Marland Kitchen MAGNESIUM BISGLYCINATE PO Take 1,000 mg by mouth daily.    . medroxyPROGESTERone (PROVERA) 2.5 MG tablet Take 2.5 mg by mouth daily.    . NON FORMULARY as needed. CBD gummies    . traZODone (DESYREL) 50 MG tablet Take 0.5 tablets (25 mg total) by mouth at bedtime. 30 tablet 0  . Turmeric (QC TUMERIC COMPLEX PO) Take 1,000 mg by mouth daily.     No facility-administered medications prior to visit.    No Known Allergies  ROS Review of Systems  Constitutional: Negative.   HENT: Negative.   Respiratory: Positive for shortness of breath (upon exertion). Negative for cough and wheezing.   Cardiovascular: Positive for palpitations. Negative for chest pain and leg swelling.  Gastrointestinal: Negative.   Endocrine: Negative.   Genitourinary: Negative.   Musculoskeletal: Positive for arthralgias, back pain (chronic) and myalgias.  Skin: Negative.   Neurological: Negative.   Psychiatric/Behavioral: Negative.       Objective:    Physical Exam Constitutional:      Appearance: Normal appearance. She is normal weight.  HENT:     Head: Normocephalic.     Nose: Nose normal.     Mouth/Throat:     Mouth: Mucous membranes are moist.     Pharynx: Oropharynx is clear.  Eyes:     Extraocular Movements: Extraocular movements intact.     Conjunctiva/sclera: Conjunctivae normal.     Pupils: Pupils are equal, round, and reactive to light.  Cardiovascular:     Rate and Rhythm: Normal rate and regular rhythm.     Pulses: Normal pulses.     Heart sounds: Normal heart sounds.  Pulmonary:     Effort: Pulmonary effort is normal. No respiratory distress.     Breath sounds: Normal breath sounds. No wheezing.  Abdominal:     General: Bowel sounds are normal.     Palpations: Abdomen is soft.  Musculoskeletal:        General: Normal range of motion.  Skin:    General: Skin is warm and dry.     Capillary  Refill: Capillary refill takes less than 2 seconds.  Neurological:     Mental Status: She is alert and oriented to person, place, and time.  Psychiatric:        Mood and Affect: Mood normal.        Behavior: Behavior normal.        Thought Content: Thought content normal.        Judgment: Judgment normal.     There were no vitals taken for this visit. Wt Readings from Last 3 Encounters:  10/12/20  122 lb (55.3 kg)  08/25/20 126 lb 6.4 oz (57.3 kg)  07/05/20 136 lb 8 oz (61.9 kg)     Health Maintenance Due  Topic Date Due  . Hepatitis C Screening  Never done  . HIV Screening  Never done  . TETANUS/TDAP  Never done  . MAMMOGRAM  Never done  . INFLUENZA VACCINE  Never done  . COVID-19 Vaccine (3 - Booster for Moderna series) 06/16/2020    There are no preventive care reminders to display for this patient.  Lab Results  Component Value Date   TSH 0.863 03/02/2020   Lab Results  Component Value Date   WBC 4.2 09/27/2020   HGB 12.7 09/27/2020   HCT 37.5 09/27/2020   MCV 94 09/27/2020   PLT 321 09/27/2020   Lab Results  Component Value Date   NA 141 09/27/2020   K 5.0 09/27/2020   CO2 21 09/27/2020   GLUCOSE 88 09/27/2020   BUN 11 09/27/2020   CREATININE 0.75 09/27/2020   BILITOT 0.5 03/02/2020   ALKPHOS 72 03/02/2020   AST 14 03/02/2020   ALT 23 03/02/2020   PROT 7.0 03/02/2020   ALBUMIN 4.7 03/02/2020   CALCIUM 9.7 09/27/2020   Lab Results  Component Value Date   CHOL 208 (H) 07/05/2020   Lab Results  Component Value Date   HDL 60 07/05/2020   Lab Results  Component Value Date   LDLCALC 132 (H) 07/05/2020   Lab Results  Component Value Date   TRIG 89 07/05/2020   Lab Results  Component Value Date   CHOLHDL 3.5 07/05/2020   No results found for: HGBA1C    Assessment & Plan:   1. Tobacco use disorder Smoking cessation advised. Encouraged pt to contact Quit-Now line. Low dose CT for lung cancer screening ordered.   2. Anxiety No changes  to current regimen. Continue scheduled appts with BH.  Notify if any SI/HI develops or go to the ED.   3. Mixed hyperlipidemia Continue dietary modifications and exercise as tolerated.  Avoid fried, greasy foods.  Will recheck lipid panel at next visit.    4. Counseling on health promotion and disease prevention Mammogram and pap smear referral completed  5. DOE (dyspnea on exertion) Maintain follow up appointments with cardiology. Cardiac MRI tomorrow; labs unremarkable.  Notify or go to the ED if symptoms worsen, CP, or difficulties breathing develop May consider pulmonology referral pending CT and MRI results   6. Fibromyalgia Follow up with pain management as ordered.  Continue PT as tolerated.     Follow-up: Return in 4 months (02/22/21), or if symptoms worsen or do not improve.   Clayton Bibles, RN, BSN, FNP-S

## 2020-10-25 ENCOUNTER — Other Ambulatory Visit: Payer: Self-pay

## 2020-10-25 ENCOUNTER — Ambulatory Visit (HOSPITAL_COMMUNITY)
Admission: RE | Admit: 2020-10-25 | Discharge: 2020-10-25 | Disposition: A | Discharge status: Home / Self Care | Payer: Self-pay | Source: Ambulatory Visit | Attending: Internal Medicine | Admitting: Internal Medicine

## 2020-10-25 DIAGNOSIS — R0600 Dyspnea, unspecified: Secondary | ICD-10-CM | POA: Insufficient documentation

## 2020-10-25 DIAGNOSIS — R0609 Other forms of dyspnea: Secondary | ICD-10-CM

## 2020-10-25 MED ORDER — GADOBUTROL 1 MMOL/ML IV SOLN
6.0000 mL | Freq: Once | INTRAVENOUS | Status: AC | PRN
  Administered 2020-10-25: 6 mL via INTRAVENOUS

## 2020-10-27 ENCOUNTER — Telehealth: Payer: Self-pay

## 2020-10-27 ENCOUNTER — Telehealth: Payer: Self-pay | Admitting: *Deleted

## 2020-10-27 DIAGNOSIS — Z122 Encounter for screening for malignant neoplasm of respiratory organs: Secondary | ICD-10-CM

## 2020-10-27 DIAGNOSIS — Z87891 Personal history of nicotine dependence: Secondary | ICD-10-CM

## 2020-10-27 NOTE — Telephone Encounter (Signed)
Received referral for initial lung cancer screening scan. Contacted patient and obtained smoking history,(former, quit 1 month ago, 46 pack year) as well as answering questions related to screening process. Patient denies signs of lung cancer such as weight loss or hemoptysis. Patient denies comorbidity that would prevent curative treatment if lung cancer were found. Patient is scheduled for shared decision making visit and CT scan on 11/06/20.

## 2020-10-27 NOTE — Telephone Encounter (Signed)
Received referral for low dose lung cancer screening CT scan. Message left at phone number listed in EMR for patient to call me back to facilitate scheduling scan.  

## 2020-10-27 NOTE — Telephone Encounter (Signed)
-----   Message from Werner Lean, MD sent at 10/27/2020  4:38 PM EST ----- Results: No mass seen in patients heart Plan: Six months echo for safety  Werner Lean, MD

## 2020-10-27 NOTE — Telephone Encounter (Signed)
The patient has been notified of the result and verbalized understanding.  All questions (if any) were answered. Precious Gilding, RN 10/27/2020 5:22 PM

## 2020-10-31 ENCOUNTER — Ambulatory Visit: Payer: Self-pay

## 2020-11-01 ENCOUNTER — Ambulatory Visit: Payer: Self-pay | Admitting: Licensed Clinical Social Worker

## 2020-11-02 ENCOUNTER — Ambulatory Visit: Payer: No Typology Code available for payment source

## 2020-11-02 ENCOUNTER — Other Ambulatory Visit: Payer: Self-pay

## 2020-11-02 DIAGNOSIS — R531 Weakness: Secondary | ICD-10-CM

## 2020-11-02 DIAGNOSIS — M545 Low back pain, unspecified: Secondary | ICD-10-CM

## 2020-11-02 NOTE — Therapy (Incomplete)
[facility] [address] [place], [place], [address] Phone: [phone]   Fax:  [phone]  Physical Therapy Treatment  Patient Details  Name: Amber Livingston MRN: [id number] Date of Birth: [birth year] Referring Provider (PT): Dr. Gillis Santa   Encounter Date: 11/02/2020   PT End of Session - 11/02/20 1522    Visit Number 24    Number of Visits 41    Date for PT Re-Evaluation 12/28/20    PT Start Time 4854    PT Stop Time 1600    PT Time Calculation (min) 45 min    Activity Tolerance Patient tolerated treatment well    Behavior During Therapy [facility] for tasks assessed/performed           Past Medical History:  Diagnosis Date  . Uterine fibroid     Past Surgical History:  Procedure Laterality Date  . ablation    . APPENDECTOMY    . DILATION AND CURETTAGE OF UTERUS     x3 for miscarriage  . LAPAROSCOPY     Fibroid removal  . PALPITATION      There were no vitals filed for this visit.   Subjective Assessment - 11/02/20 1521    Subjective Pt arrives reporting high levels of pain today. She states that the recent weather changes and the upcoming full moon have aggravated her pain. She is reporting 6-7/10 upon arrival.    Pertinent History fibromyalgia    Patient Stated Goals return to work, improve strength and mobility, decrease pain    Currently in Pain? Yes    Pain Score 7     Pain Location Back    Pain Orientation Right;Left    Pain Descriptors / Indicators Aching    Pain Type Chronic pain    Pain Onset More than a month ago    Pain Frequency Constant              TREATMENT   Manual Therapy Moist heat to lumbar spine in prone during interval history; STM tolumbar region bilateral paraspinals,quadratus lumborum,and glutMax/med/piriformis muscles utilizingpercussive STM with Hypervolt,; Grade I-II CPA grade mobs L1-L5, 20s/bout x2bouts/level; Grade I-II UPA grade mobs L1-L5 bilateral, 20s/bout  x2bouts/level;   Ther-ex Prone straight and bent knee hip extension x20 BLE each; Prone hamstring curls with manual resistance from therapist x10 BLE each; Hooklying bridges x 10; Hooklying isometric resisted lumbar rotation with manual resistance provided at knees by therapist 5s hold x 10 each direction;   Electrical Stimulation  NMES with patient in prone to lower lumbar paraspinals using large muscle spasm setting on Empi Continuum unit at default setting (pulse rate: 80Hz , off time: 5s, symmetrical waveform, 300 microsec pulse width, 2s ramp up/down, 10s on time/5s off time) at pt tolerable intensity of 20 bilateral x 20 minutes to decrease muscle spasm. Small circle pads utilized in two parallel channels along lumbar spine. Skin inspection performed prior to and following without any visible signs of irritation. Pt reports decrease in her pain at end of session but does not rate on NPRS.     Pt educated throughout session about proper posture and technique with exercises. Improved exercise technique, movement at target joints, use of target muscles after min to mod verbal, visual, tactile cues.   Patient with increase in pain in low back upon arrival today so unable to update outcome measures with patient. Overall she is makingexcellentprogress with therapy despite intermittent brief setbacks . Continued with prone soft tissue mobilization and lumbar  mobilizationstoday during session. Utilized NMES for lumbar muscle spasms during manual therapy and during some limited exercises to improve tolerance (decrease in pain) with these activities. Continued with gentle abdominal, low back, and hip strengthening to help with long-term management of back pain. Patient encouraged to continue HEP, monitor for response to treatment today, and follow-up as scheduled. She has yet to achieve maximal benefit from physical therapy and pt will benefit fromadditionalPT services to address deficits  in back pain and improve pain-free function at home.                               PT Long Term Goals - 11/03/20 2117      PT LONG TERM GOAL #1   Title Pt. will perform 5xSTS in 12 seconds or less to demonstrate improvements in LE strength and functional mobility.    Baseline 11/16: 19.4s; 08/03/20: 11.5s    Time 8    Period Weeks    Status Achieved      PT LONG TERM GOAL #2   Title Pt. will report ability to vaccuum for greater than 10 mins with no increase in back pain over 2/10 to improve pain free functional mobility.    Baseline 11/16: pt is currently not able to vaccuum for 4-5 minutes with back pain at 5/10; 08/03/20: Pt can vaccuum for 4-5 minutes, no pain but fatigue, rest for a minute and continue. 2/15 pt had to stop 3 times while vacuuming and rest for a minute, reported discomfort with activity. Biggest complaint of numbness    Time 8    Period Weeks    Status Deferred    Target Date 12/28/20      PT LONG TERM GOAL #3   Title Pt. will improve FOTO to 50 to demonstrate improvement in pain free functional mobility.    Baseline 11/16: 35; 08/03/20: 63    Time 4    Period Weeks    Status Achieved      PT LONG TERM GOAL #4   Title Pt. independent with HEP to increase B hip strength 1/2 muscle grade to improve standing tolerance/ pain-free household tasks.    Baseline Pt. demonstrates 5/5 strength bilaterally, except for bilat hip IR, ER, and flexion which scored 4/5 for bilat for each. 08/03/20: R/L Hip flexion: 4+/4+, hip extension: 4/4, hip abduction: 4+/4+, hip adduction: 4+/4; deferred to next session    Time 8    Period Weeks    Status Deferred    Target Date 12/28/20      PT LONG TERM GOAL #5   Title Patient will report and demonstrate the ability to perform finalized HEP independently, as well as an understanding on how to progress program to continue to maximize strength and function.    Time 8    Period Weeks    Status On-going     Target Date 12/28/20                 Plan - 11/03/20 2116    Clinical Impression Statement Patient with increase in pain in low back upon arrival today so unable to update outcome measures with patient. Overall she is making excellent progress with therapy despite intermittent brief setbacks . Continued with prone soft tissue mobilization and lumbar mobilizations today during session. Utilized NMES for lumbar muscle spasms during manual therapy and during some limited exercises to improve tolerance (decrease in pain) with these activities. Continued with  gentle abdominal, low back, and hip strengthening to help with long-term management of back pain. Patient encouraged to continue HEP, monitor for response to treatment today, and follow-up as scheduled. She has yet to achieve maximal benefit from physical therapy and pt will benefit from additional PT services to address deficits in back pain and improve pain-free function at home.    Personal Factors and Comorbidities Fitness;Past/Current Experience;Time since onset of injury/illness/exacerbation    Examination-Activity Limitations Bed Mobility;Lift;Stairs;Squat;Stand;Transfers;Sit    Examination-Participation Restrictions Community Activity;Yard Work    Stability/Clinical Decision Making Stable/Uncomplicated    Rehab Potential Fair    PT Frequency 2x / week    PT Duration 8 weeks    PT Treatment/Interventions ADLs/Self Care Home Management;Aquatic Therapy;Cryotherapy;Gait training;Stair training;Moist Heat;Functional mobility training;Therapeutic activities;Therapeutic exercise;Balance training;Neuromuscular re-education;Patient/family education;Manual techniques;Dry needling;Electrical Stimulation;Spinal Manipulations;Joint Manipulations    PT Next Visit Plan prone manual tx, adominal and low back strengthening;    PT Home Exercise Plan Medbridge Access Code: [email or web address]    Consulted and Agree with Plan of Care Patient           Patient  will benefit from skilled therapeutic intervention in order to improve the following deficits and impairments:  Abnormal gait,Decreased activity tolerance,Decreased endurance,Decreased mobility,Decreased range of motion,Hypomobility,Difficulty walking,Decreased strength,Increased muscle spasms,Impaired perceived functional ability,Pain  Visit Diagnosis: Chronic bilateral low back pain, unspecified whether sciatica present  Decreased strength     Problem List Patient Active Problem List   Diagnosis Date Noted  . Counseling on health promotion and disease prevention 10/24/2020  . Palpitations 08/25/2020  . DOE (dyspnea on exertion) 08/25/2020  . Mixed hyperlipidemia 08/25/2020  . History of palpitations 07/05/2020  . Shortness of breath 07/05/2020  . Chronic bilateral low back pain without sciatica 06/06/2020  . Lumbar facet arthropathy 06/06/2020  . Myofascial pain 06/06/2020  . Chronic pain syndrome 06/06/2020  . Cannabis use disorder, mild, abuse 06/06/2020  . Fibromyalgia 06/06/2020  . Elevated lipids 03/09/2020  . Encounter to establish care 02/22/2020  . Chronic abdominal pain 02/22/2020  . Hot flashes 02/22/2020  . Chronic back pain 02/22/2020  . Anxiety 02/22/2020   Phillips Grout PT, DPT, GCS  Huprich,Jason 11/03/2020, 9:34 PM  [place] [address] [place], [place], [address] Phone: [phone]   Fax:  [phone]  Name: Amber Livingston MRN: [id number] Date of Birth: 22-Nov-1960

## 2020-11-03 NOTE — Addendum Note (Signed)
Addended by: Roxana Hires D on: 11/03/2020 09:35 PM   Modules accepted: Orders

## 2020-11-06 ENCOUNTER — Inpatient Hospital Stay: Payer: No Typology Code available for payment source | Attending: Oncology | Admitting: Nurse Practitioner

## 2020-11-06 ENCOUNTER — Other Ambulatory Visit: Payer: Self-pay

## 2020-11-06 ENCOUNTER — Ambulatory Visit
Admission: RE | Admit: 2020-11-06 | Discharge: 2020-11-06 | Disposition: A | Discharge status: Home / Self Care | Payer: Self-pay | Source: Ambulatory Visit | Attending: Oncology | Admitting: Oncology

## 2020-11-06 DIAGNOSIS — Z87891 Personal history of nicotine dependence: Secondary | ICD-10-CM | POA: Insufficient documentation

## 2020-11-06 DIAGNOSIS — Z122 Encounter for screening for malignant neoplasm of respiratory organs: Secondary | ICD-10-CM | POA: Insufficient documentation

## 2020-11-06 NOTE — Progress Notes (Signed)
Virtual Visit via Video Enabled Telemedicine Note   I connected with Karlie Knoth on 11/06/20 at 1:00 PM EST by video enabled telemedicine visit and verified that I am speaking with the correct person using two identifiers.   I discussed the limitations, risks, security and privacy concerns of performing an evaluation and management service by telemedicine and the availability of in-person appointments. I also discussed with the patient that there may be a patient responsible charge related to this service. The patient expressed understanding and agreed to proceed.   Other persons participating in the visit and their role in the encounter: Burgess Estelle, RN- checking in patient & navigation  Patient's location: home  Provider's location: Clinic  Chief Complaint: Low Dose CT Screening  Patient agreed to evaluation by telemedicine to discuss shared decision making for consideration of low dose CT lung cancer screening.    In accordance with CMS guidelines, patient has met eligibility criteria including age, absence of signs or symptoms of lung cancer.  Social History   Tobacco Use  . Smoking status: Former Smoker    Packs/day: 1.00    Years: 46.00    Pack years: 46.00    Types: Cigarettes    Quit date: 10/04/2020    Years since quitting: 0.0  . Smokeless tobacco: Never Used  Substance Use Topics  . Alcohol use: Not Currently    Comment: Occ     A shared decision-making session was conducted prior to the performance of CT scan. This includes one or more decision aids, includes benefits and harms of screening, follow-up diagnostic testing, over-diagnosis, false positive rate, and total radiation exposure.   Counseling on the importance of adherence to annual lung cancer LDCT screening, impact of co-morbidities, and ability or willingness to undergo diagnosis and treatment is imperative for compliance of the program.   Counseling on the importance of continued smoking cessation for  former smokers; the importance of smoking cessation for current smokers, and information about tobacco cessation interventions have been given to patient including [place] and [employer] Quit  programs.   Written order for lung cancer screening with LDCT has been given to the patient and any and all questions have been answered to the best of my abilities.    Yearly follow up will be coordinated by Burgess Estelle, Thoracic Navigator.  I discussed the assessment and treatment plan with the patient. The patient was provided an opportunity to ask questions and all were answered. The patient agreed with the plan and demonstrated an understanding of the instructions.   The patient was advised to call back or seek an in-person evaluation if the symptoms worsen or if the condition fails to improve as anticipated.   I provided 15 minutes of face-to-face video visit time dedicated to the care of this patient on the date of this encounter to include pre-visit review of smoking history, face-to-face time with the patient, and post visit ordering of testing/documentation.   Beckey Rutter, DNP, AGNP-C [place] [facility] [phone] (clinic)

## 2020-11-07 ENCOUNTER — Encounter: Payer: Self-pay | Admitting: Internal Medicine

## 2020-11-07 ENCOUNTER — Ambulatory Visit (INDEPENDENT_AMBULATORY_CARE_PROVIDER_SITE_OTHER): Payer: No Typology Code available for payment source | Admitting: Internal Medicine

## 2020-11-07 ENCOUNTER — Ambulatory Visit: Payer: No Typology Code available for payment source

## 2020-11-07 VITALS — BP 118/78 | HR 54 | Ht 69.0 in | Wt 123.6 lb

## 2020-11-07 DIAGNOSIS — E782 Mixed hyperlipidemia: Secondary | ICD-10-CM

## 2020-11-07 DIAGNOSIS — I5189 Other ill-defined heart diseases: Secondary | ICD-10-CM

## 2020-11-07 DIAGNOSIS — M545 Low back pain, unspecified: Secondary | ICD-10-CM

## 2020-11-07 DIAGNOSIS — F1210 Cannabis abuse, uncomplicated: Secondary | ICD-10-CM

## 2020-11-07 DIAGNOSIS — R531 Weakness: Secondary | ICD-10-CM

## 2020-11-07 DIAGNOSIS — I471 Supraventricular tachycardia: Secondary | ICD-10-CM

## 2020-11-07 NOTE — Patient Instructions (Signed)
Medication Instructions:  Your physician recommends that you continue on your current medications as directed. Please refer to the Current Medication list given to you today.  *If you need a refill on your cardiac medications before your next appointment, please call your pharmacy*   Lab Work: NONE If you have labs (blood work) drawn today and your tests are completely normal, you will receive your results only by: Marland Kitchen MyChart Message (if you have MyChart) OR . A paper copy in the mail If you have any lab test that is abnormal or we need to change your treatment, we will call you to review the results.   Testing/Procedures: Your physician has requested that you have an echocardiogram in September. Echocardiography is a painless test that uses sound waves to create images of your heart. It provides your doctor with information about the size and shape of your heart and how well your heart's chambers and valves are working. This procedure takes approximately one hour. There are no restrictions for this procedure.     Follow-Up: At [facility], you and your health needs are our priority.  As part of our continuing mission to provide you with exceptional heart care, we have created designated Provider Care Teams.  These Care Teams include your primary Cardiologist (physician) and Advanced Practice Providers (APPs -  Physician Assistants and Nurse Practitioners) who all work together to provide you with the care you need, when you need it.  We recommend signing up for the patient portal called "MyChart".  Sign up information is provided on this After Visit Summary.  MyChart is used to connect with patients for Virtual Visits (Telemedicine).  Patients are able to view lab/test results, encounter notes, upcoming appointments, etc.  Non-urgent messages can be sent to your provider as well.   To learn more about what you can do with MyChart, go to [email or web address].    Your next appointment:    7 month(s)  The format for your next appointment:   In Person  Provider:   You may see Werner Lean, MD or one of the following Advanced Practice Providers on your designated Care Team:    Melina Copa, PA-C  Ermalinda Barrios, PA-C

## 2020-11-07 NOTE — Therapy (Signed)
[facility] [address] [place], [place], [address] Phone: [phone]   Fax:  [phone]  Physical Therapy Treatment  Patient Details  Name: Amber Livingston MRN: [id number] Date of Birth: Aug 24, 1960 Referring Provider (PT): Dr. Gillis Santa   Encounter Date: 11/07/2020   PT End of Session - 11/07/20 1526    Visit Number 25    Number of Visits 41    Date for PT Re-Evaluation 12/28/20    PT Start Time 9326    PT Stop Time 1600    PT Time Calculation (min) 45 min    Activity Tolerance Patient tolerated treatment well    Behavior During Therapy [facility] for tasks assessed/performed           Past Medical History:  Diagnosis Date  . Uterine fibroid     Past Surgical History:  Procedure Laterality Date  . ablation    . APPENDECTOMY    . DILATION AND CURETTAGE OF UTERUS     x3 for miscarriage  . LAPAROSCOPY     Fibroid removal  . PALPITATION      There were no vitals filed for this visit.   Subjective Assessment - 11/07/20 1524    Subjective Pt reports that she is doing well today. She is leaving to fly tomorrow down to [place]. Denies any pain upon arrival today. No specific questions upon arrival today.    Pertinent History fibromyalgia    Patient Stated Goals return to work, improve strength and mobility, decrease pain    Currently in Pain? No/denies              TREATMENT   Manual Therapy Moist heat to lumbar spine in prone during interval history; STM tolumbar region bilateral paraspinals,quadratus lumborum,and glutMax/med/piriformis muscles utilizingpercussive STM with Hypervolt,; Grade I-II CPA grade mobs L1-L5, 20s/bout x2bouts/level; Grade I-II UPA grade mobs L1-L5 bilateral, 20s/bout x2bouts/level;   Ther-ex Prone straight and bent knee hip extension x10 BLE each; Prone hamstring curls with manual resistance from therapist x10 BLE each; Sidelying clams with manual resistance 2 x 10  BLE; Sidelying abductor with manual resistance 2 x 10 BLE; Hooklying bridges x 10; Hooklying bridges with alternating marching x 10 BLE; Hooklying transverse abdominis contraction with alternating SLR x 10 BLE;     Pt educated throughout session about proper posture and technique with exercises. Improved exercise technique, movement at target joints, use of target muscles after min to mod verbal, visual, tactile cues.   Pt reports no pain upon arrival today so additional time spent working on strengthening with patient. She denies any increase in back pain but does report some difficulty with R hip straight leg abduction. Continued with gentle abdominal, low back, and hip strengthening to help with long-term management of back pain. Patient encouraged to continue HEP, monitor for response to treatment today, and follow-up as scheduled. She has yet to achieve maximal benefit from physical therapy and pt will benefit fromadditionalPT services to address deficits in back pain and improve pain-free function at home.                              PT Long Term Goals - 11/03/20 2117      PT LONG TERM GOAL #1   Title Pt. will perform 5xSTS in 12 seconds or less to demonstrate improvements in LE strength and functional mobility.    Baseline 11/16: 19.4s; 08/03/20: 11.5s  Time 8    Period Weeks    Status Achieved      PT LONG TERM GOAL #2   Title Pt. will report ability to vaccuum for greater than 10 mins with no increase in back pain over 2/10 to improve pain free functional mobility.    Baseline 11/16: pt is currently not able to vaccuum for 4-5 minutes with back pain at 5/10; 08/03/20: Pt can vaccuum for 4-5 minutes, no pain but fatigue, rest for a minute and continue. 2/15 pt had to stop 3 times while vacuuming and rest for a minute, reported discomfort with activity. Biggest complaint of numbness    Time 8    Period Weeks    Status Deferred    Target Date  12/28/20      PT LONG TERM GOAL #3   Title Pt. will improve FOTO to 50 to demonstrate improvement in pain free functional mobility.    Baseline 11/16: 35; 08/03/20: 63    Time 4    Period Weeks    Status Achieved      PT LONG TERM GOAL #4   Title Pt. independent with HEP to increase B hip strength 1/2 muscle grade to improve standing tolerance/ pain-free household tasks.    Baseline Pt. demonstrates 5/5 strength bilaterally, except for bilat hip IR, ER, and flexion which scored 4/5 for bilat for each. 08/03/20: R/L Hip flexion: 4+/4+, hip extension: 4/4, hip abduction: 4+/4+, hip adduction: 4+/4; deferred to next session    Time 8    Period Weeks    Status Deferred    Target Date 12/28/20      PT LONG TERM GOAL #5   Title Patient will report and demonstrate the ability to perform finalized HEP independently, as well as an understanding on how to progress program to continue to maximize strength and function.    Time 8    Period Weeks    Status On-going    Target Date 12/28/20                 Plan - 11/07/20 1527    Clinical Impression Statement Pt reports no pain upon arrival today so additional time spent working on strengthening with patient. She denies any increase in back pain but does report some difficulty with R hip straight leg abduction. Continued with gentle abdominal, low back, and hip strengthening to help with long-term management of back pain. Patient encouraged to continue HEP, monitor for response to treatment today, and follow-up as scheduled. She has yet to achieve maximal benefit from physical therapy and pt will benefit from additional PT services to address deficits in back pain and improve pain-free function at home.    Personal Factors and Comorbidities Fitness;Past/Current Experience;Time since onset of injury/illness/exacerbation    Examination-Activity Limitations Bed Mobility;Lift;Stairs;Squat;Stand;Transfers;Sit    Examination-Participation  Restrictions Community Activity;Yard Work    Stability/Clinical Decision Making Stable/Uncomplicated    Rehab Potential Fair    PT Frequency 2x / week    PT Duration 8 weeks    PT Treatment/Interventions ADLs/Self Care Home Management;Aquatic Therapy;Cryotherapy;Gait training;Stair training;Moist Heat;Functional mobility training;Therapeutic activities;Therapeutic exercise;Balance training;Neuromuscular re-education;Patient/family education;Manual techniques;Dry needling;Electrical Stimulation;Spinal Manipulations;Joint Manipulations    PT Next Visit Plan prone manual tx, adominal and low back strengthening;    PT Home Exercise Plan Medbridge Access Code: [email or web address]    Consulted and Agree with Plan of Care Patient           Patient will benefit from skilled therapeutic intervention in order to  improve the following deficits and impairments:  Abnormal gait,Decreased activity tolerance,Decreased endurance,Decreased mobility,Decreased range of motion,Hypomobility,Difficulty walking,Decreased strength,Increased muscle spasms,Impaired perceived functional ability,Pain  Visit Diagnosis: Chronic bilateral low back pain, unspecified whether sciatica present  Decreased strength     Problem List Patient Active Problem List   Diagnosis Date Noted  . SVT (supraventricular tachycardia) ([place]) 11/07/2020  . Counseling on health promotion and disease prevention 10/24/2020  . Palpitations 08/25/2020  . DOE (dyspnea on exertion) 08/25/2020  . Mixed hyperlipidemia 08/25/2020  . History of palpitations 07/05/2020  . Shortness of breath 07/05/2020  . Chronic bilateral low back pain without sciatica 06/06/2020  . Lumbar facet arthropathy 06/06/2020  . Myofascial pain 06/06/2020  . Chronic pain syndrome 06/06/2020  . Cannabis use disorder, mild, abuse 06/06/2020  . Fibromyalgia 06/06/2020  . Elevated lipids 03/09/2020  . Encounter to establish care 02/22/2020  . Chronic abdominal pain 02/22/2020  .  Hot flashes 02/22/2020  . Chronic back pain 02/22/2020  . Anxiety 02/22/2020   Phillips Grout PT, DPT, GCS-p  Brannan Cassedy 11/07/2020, 4:35 PM  [facility] [address] [place], [place], [address] Phone: [phone]   Fax:  [phone]  Name: Amber Livingston MRN: [id number] Date of Birth: [birth year]

## 2020-11-07 NOTE — Progress Notes (Signed)
Cardiology Office Note:    Date:  11/07/2020   ID:  Amber Livingston, DOB 1961/02/20, MRN [id number]  PCP:  Malachy Mood, MD  [facility] Cardiologist:  Werner Lean, MD  [facility] Electrophysiologist:  None   CC: Shortness of breath  History of Present Illness:    Amber Livingston is a 60 y.o. female with a hx of Uterine Ablation, Cannabis Abuse, and HLD who presents for evaluation 08/26/19.  In interim of this visit, patient had echo with possible atrial mass; MR suggestive of lipomatous hypertrophy.  Negative Stress test.  Patient notes that she is doing OK.  Since day prior/last visit notes lung cancer screening test changes.  Relevant interval testing or therapy include no changes in medication  There are no interval hospital/ED visit.    No chest pain or pressure .  No SOB/DOE and no PND/Orthopnea.  No weight gain or leg swelling.  No palpitations or syncope.  Symptoms have all gone away with cigarettes.   Past Medical History:  Diagnosis Date  . Uterine fibroid     Past Surgical History:  Procedure Laterality Date  . ablation    . APPENDECTOMY    . DILATION AND CURETTAGE OF UTERUS     x3 for miscarriage  . LAPAROSCOPY     Fibroid removal  . PALPITATION      Current Medications: Current Meds  Medication Sig  . albuterol (VENTOLIN HFA) 108 (90 Base) MCG/ACT inhaler Inhale 2 puffs into the lungs every 6 (six) hours as needed for wheezing or shortness of breath.  . BEE POLLEN PO Take 1 capsule by mouth daily.  . Cyanocobalamin (B-12) 2500 MCG TABS Take by mouth daily.  Marland Kitchen D-Ribose (RIBOSE, D,) POWD 2,500 mg by Does not apply route in the morning and at bedtime.  . diphenhydramine-acetaminophen (TYLENOL PM) 25-500 MG TABS tablet Take 2 tablets by mouth at bedtime as needed.  Marland Kitchen estradiol (ESTRACE) 0.5 MG tablet Take 0.5 mg by mouth daily.  Marland Kitchen MAGNESIUM BISGLYCINATE PO Take 1,000 mg by mouth daily.  . medroxyPROGESTERone (PROVERA) 2.5 MG tablet Take 2.5 mg by  mouth daily.  . NON FORMULARY as needed. CBD gummies  . traZODone (DESYREL) 50 MG tablet Take 0.5 tablets (25 mg total) by mouth at bedtime.  . Turmeric (QC TUMERIC COMPLEX PO) Take 1,000 mg by mouth daily.     Allergies:   Patient has no allergy information on record.   Social History   Socioeconomic History  . Marital status: Married    Spouse name: Not on file  . Number of children: Not on file  . Years of education: Not on file  . Highest education level: Not on file  Occupational History  . Not on file  Tobacco Use  . Smoking status: Former Smoker    Packs/day: 1.00    Years: 46.00    Pack years: 46.00    Types: Cigarettes    Quit date: 10/04/2020    Years since quitting: 0.0  . Smokeless tobacco: Never Used  Vaping Use  . Vaping Use: Never used  Substance and Sexual Activity  . Alcohol use: Not Currently    Comment: Occ  . Drug use: Yes    Types: Marijuana  . Sexual activity: Yes    Partners: Male  Other Topics Concern  . Not on file  Social History Narrative  . Not on file   Social Determinants of Health   Financial Resource Strain: High Risk  . Difficulty of  Paying Living Expenses: Very hard  Food Insecurity: Food Insecurity Present  . Worried About Charity fundraiser in the Last Year: Sometimes true  . Ran Out of Food in the Last Year: Never true  Transportation Needs: No Transportation Needs  . Lack of Transportation (Medical): No  . Lack of Transportation (Non-Medical): No  Physical Activity: Inactive  . Days of Exercise per Week: 0 days  . Minutes of Exercise per Session: 0 min  Stress: Stress Concern Present  . Feeling of Stress : To some extent  Social Connections: Socially Isolated  . Frequency of Communication with Friends and Family: Twice a week  . Frequency of Social Gatherings with Friends and Family: Never  . Attends Religious Services: Never  . Active Member of Clubs or Organizations: No  . Attends Archivist Meetings: Never   . Marital Status: Never married     Family History: The patient's family history includes Brain cancer in her mother; Breast cancer (age of onset: 21) in her maternal grandmother and paternal grandmother; Lung cancer in her mother; Uterine cancer (age of onset: 22) in her mother.  ROS:   Please see the history of present illness.     All other systems reviewed and are negative.  EKGs/Labs/Other Studies Reviewed:    The following studies were reviewed today:  EKG:   08/25/20: SR 64 WNL 07/06/2020: Sinus 65 WNL  CMR: Date: 10/25/20 Results: IMPRESSION: 1. Increased signal on dark blood imaging consistent with lipomatous hypertrophy of the interatrial septum. Reasonable for repeating imaging follow up (echocardiogram or CMR) in 6 months to confirm no change in interval.  Transthoracic Echocardiogram: Date: 09/21/20 Results:  IMPRESSIONS  1. Left ventricular ejection fraction, by estimation, is 60 to 65%. The  left ventricle has normal function. The left ventricle has no regional  wall motion abnormalities. Left ventricular diastolic parameters were  normal.  2. Right ventricular systolic function is normal. The right ventricular  size is normal.  3. There is a prominent projection of the intra-atrial septum into the  right atrium, best seen in image 35.  4. The mitral valve is normal in structure. Mild mitral valve  regurgitation. No evidence of mitral stenosis.  5. The aortic valve is tricuspid. Aortic valve regurgitation is not  visualized. No aortic stenosis is present.    ECG Stress Testing : Date: 09/21/20 Results: Exercise time: 6:37 min Workload: 8 METS, average exercise capacity Test stopped due to: fatigue, sob, patient request BP response: normal  HR response: blunted HR response (peak HR 130 - 80% max HR) Rhythm: SR  Suboptimal HR response to exercise. No ischemia on stress ECG at suboptimal peak heart rate, which may reduce the sensitivity of this study  to detect ischemia.   Cardiac Event Monitoring Date: 09/25/20 Results:  Patient had a minimum heart rate of 39 bpm, maximum heart rate of 160 bpm, and average heart rate of 66 bpm.  Predominant underlying rhythm was sinus rhythm.  Four runs of supraventricular tachycardia occurred lasting 16 beats at longest with a max rate of 160 bpm at fastest.  Isolated PACs were rare (<1.0%), with rare couplets present.  Isolated PVCs were rare (<1.0%), with rare couplets and bigeminy present.  No evidence of complete heart block.  Triggered and diary events associated with sinus rhythm, one episodes of SVT, and rare PVC.   Occasionally symptomatic SVT.  Recent Labs: 03/02/2020: ALT 23; TSH 0.863 09/27/2020: BUN 11; Creatinine, Ser 0.75; Hemoglobin 12.7; Platelets 321; Potassium  5.0; Sodium 141  Recent Lipid Panel    Component Value Date/Time   CHOL 208 (H) 07/05/2020 1031   TRIG 89 07/05/2020 1031   HDL 60 07/05/2020 1031   CHOLHDL 3.5 07/05/2020 1031   LDLCALC 132 (H) 07/05/2020 1031   Risk Assessment/Calculations:     ASCVD Risk 4.2%  Physical Exam:    VS:  BP 118/78   Pulse (!) 54   Ht 5\' 9"  (1.753 m)   Wt 123 lb 9.6 oz (56.1 kg)   SpO2 99%   BMI 18.25 kg/m     Wt Readings from Last 3 Encounters:  11/07/20 123 lb 9.6 oz (56.1 kg)  11/06/20 121 lb (54.9 kg)  10/24/20 121 lb 8 oz (55.1 kg)    GEN:  Well nourished, well developed in no acute distress HEENT: Normal NECK: No JVD; No carotid bruits LYMPHATICS: No lymphadenopathy CARDIAC: RRR, no murmurs, rubs, gallops RESPIRATORY:  Clear to auscultation without rales, wheezing or rhonchi  ABDOMEN: Soft, non-tender, non-distended MUSCULOSKELETAL:  No edema; No deformity  SKIN: Warm and dry NEUROLOGIC:  Alert and oriented x 3 PSYCHIATRIC:  Normal affect   ASSESSMENT:    1. SVT (supraventricular tachycardia) ([place])   2. Mixed hyperlipidemia   3. Cannabis use disorder, mild, abuse   4. 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 - counseled on the benefits of smoking cessation. - firmly advised to quit.  - we also reviewed strategies to maximize success, including:  Removing cigarettes and smoking materials from environment   Stress management  Substitution of other forms of reinforcement (the one cigarette a day approach)  Support of family/friends and group smoking cessation  Selecting a quit date  Patient provided contact information for [employer] or 1-800-QUIT-NOW  Patient provided with [place]'s 8 free smoking cessation classes: ([phone] and CarWashShow.fr   7 month follow up unless new symptoms or abnormal test results warranting change in plan  Would be reasonable for  Video Visit Follow up Would be reasonable for  APP Follow up   Medication Adjustments/Labs and Tests Ordered: Current medicines are reviewed at length with the patient today.  Concerns regarding medicines are outlined above.  Orders Placed This Encounter  Procedures  . ECHOCARDIOGRAM COMPLETE   No orders of the defined types were placed in this encounter.   Patient Instructions  Medication Instructions:  Your physician recommends that you continue on your current medications as directed. Please refer to the Current Medication list given to you today.  *If you need a refill on your cardiac medications before your next appointment, please call your pharmacy*   Lab Work: NONE If you  have labs (blood work) drawn today and your tests are completely normal, you will receive your results only by: Marland Kitchen MyChart Message (if you have MyChart) OR . A paper copy in the mail If you have any lab test that is abnormal or we need to change your treatment, we will call you to review the results.  Testing/Procedures: Your physician has requested that you have an echocardiogram in September. Echocardiography is a painless test that uses sound waves to create images of your heart. It provides your doctor with information about the size and shape of your heart and how well your heart's chambers and valves are working. This procedure takes approximately one hour. There are no restrictions for this procedure.     Follow-Up: At [facility], you and your health needs are our priority.  As part of our continuing mission to provide you with exceptional heart care, we have created designated Provider Care Teams.  These Care Teams include your primary Cardiologist (physician) and Advanced Practice Providers (APPs -  Physician Assistants and Nurse Practitioners) who all work together to provide you with the care you need, when you need it.  We recommend signing up for the patient portal called "MyChart".  Sign up information is provided on this After Visit Summary.  MyChart is used to connect with patients for Virtual Visits (Telemedicine).  Patients are able to view lab/test results, encounter notes, upcoming appointments, etc.  Non-urgent messages can be sent to your provider as well.   To learn more about what you can do with MyChart, go to [email or web address].    Your next appointment:   7 month(s)  The format for your next appointment:   In Person  Provider:   You may see Werner Lean, MD or one of the following Advanced Practice Providers on your designated Care Team:    Melina Copa, PA-C  Ermalinda Barrios, PA-C          Signed, Werner Lean, MD  11/07/2020  9:02 AM    [place]

## 2020-11-08 ENCOUNTER — Telehealth: Payer: Self-pay | Admitting: *Deleted

## 2020-11-08 NOTE — Telephone Encounter (Signed)
Notified patient of LDCT lung cancer screening program results with recommendation for 6 month follow up imaging. Also notified of incidental findings noted below and is encouraged to discuss further with PCP who will receive a copy of this note and/or the CT report. Patient verbalizes understanding.   IMPRESSION: 1. Lung-RADS 3S, probably benign findings. Short-term follow-up in 6 months is recommended with repeat low-dose chest CT without contrast (please use the following order, "CT CHEST LCS NODULE FOLLOW-UP W/O CM"). 2. The "S" modifier above refers to potentially clinically significant non lung cancer related findings. Specifically, there is aortic atherosclerosis, in addition to left anterior descending coronary artery disease. Please note that although the presence of coronary artery calcium documents the presence of coronary artery disease, the severity of this disease and any potential stenosis cannot be assessed on this non-gated CT examination. Assessment for potential risk factor modification, dietary therapy or pharmacologic therapy may be warranted, if clinically indicated. 3. Mild diffuse bronchial wall thickening with mild centrilobular and paraseptal emphysema; imaging findings suggestive of underlying COPD.

## 2020-11-14 ENCOUNTER — Ambulatory Visit: Payer: No Typology Code available for payment source

## 2020-11-14 NOTE — Patient Instructions (Incomplete)
TREATMENT   Manual Therapy Moist heat to lumbar spinein prone during interval history; STM tolumbar region bilateral paraspinals,quadratus lumborum,and glutMax/med/piriformis muscles utilizingpercussive STM with Hypervolt,; Grade I-II CPA grade mobs L1-L5, 20s/bout x2bouts/level; Grade I-II UPA grade mobs L1-L5 bilateral, 20s/bout x2bouts/level;   Ther-ex Prone straight and bent knee hip extension x10 BLE each; Prone hamstring curls with manual resistance from therapist x10 BLE each; Sidelying clams with manual resistance 2 x 10 BLE; Sidelying abductor with manual resistance 2 x 10 BLE; Hooklying bridges x 10; Hooklying bridges with alternating marching x 10 BLE; Hooklying transverse abdominis contraction with alternating SLR x 10 BLE;    Pt educated throughout session about proper posture and technique with exercises. Improved exercise technique, movement at target joints, use of target muscles after min to mod verbal, visual, tactile cues.   Pt reports no pain upon arrival today so additional time spent working on strengthening with patient. She denies any increase in back pain but does report some difficulty with R hip straight leg abduction. Continued with gentle abdominal, low back, and hip strengthening to help with long-term management of back pain.Patient encouraged to continue HEP, monitor for response to treatment today, and follow-up as scheduled. She has yet to achieve maximal benefit from physical therapy and pt will benefit fromadditionalPT services to address deficits in back pain and improve pain-free function at home.

## 2020-11-15 ENCOUNTER — Ambulatory Visit: Payer: No Typology Code available for payment source | Attending: Oncology

## 2020-11-16 ENCOUNTER — Other Ambulatory Visit: Payer: Self-pay

## 2020-11-16 ENCOUNTER — Ambulatory Visit: Payer: No Typology Code available for payment source

## 2020-11-16 DIAGNOSIS — G8929 Other chronic pain: Secondary | ICD-10-CM

## 2020-11-16 DIAGNOSIS — M545 Low back pain, unspecified: Secondary | ICD-10-CM

## 2020-11-16 DIAGNOSIS — R531 Weakness: Secondary | ICD-10-CM

## 2020-11-16 NOTE — Therapy (Signed)
[facility] [address] [place], [place], [address] Phone: [phone]   Fax:  [phone]  Physical Therapy Treatment  Patient Details  Name: Amber Livingston MRN: [id number] Date of Birth: Apr 24, 1961 Referring Provider (PT): Dr. Gillis Santa   Encounter Date: 11/16/2020   PT End of Session - 11/16/20 1511    Visit Number 26    Number of Visits 41    Date for PT Re-Evaluation 12/28/20    PT Start Time 0350    PT Stop Time 1553    PT Time Calculation (min) 38 min    Activity Tolerance Patient tolerated treatment well    Behavior During Therapy [facility] for tasks assessed/performed           Past Medical History:  Diagnosis Date  . Uterine fibroid     Past Surgical History:  Procedure Laterality Date  . ablation    . APPENDECTOMY    . DILATION AND CURETTAGE OF UTERUS     x3 for miscarriage  . LAPAROSCOPY     Fibroid removal  . PALPITATION      There were no vitals filed for this visit.   Subjective Assessment - 11/16/20 1516    Subjective Pt reports that she has had a lot of pain since returning from her trip. She had 4/10 pain in her low back earlier this morning but it is better now. No specific questions upon arrival today.    Pertinent History fibromyalgia    Patient Stated Goals return to work, improve strength and mobility, decrease pain    Currently in Pain? Yes    Pain Score 4    4/10 this morning, actually slightly better now but does not rate   Pain Location Back    Pain Orientation Right;Left    Pain Descriptors / Indicators Aching    Pain Type Chronic pain    Pain Onset More than a month ago    Pain Frequency Intermittent              TREATMENT   Manual Therapy Moist heat to lumbar spinein prone during interval history; STM tolumbar region bilateral paraspinals,quadratus lumborum,and glutMax/med/piriformis muscles utilizingpercussive STM with Hypervolt. Utilized IASTM to bilateral lumbar  paraspinals, also performed myofascial release to lumbar spine bilaterally; Grade I-II CPA grade mobs L1-L5, 30s/bout x2bouts/level; Grade I-II UPA grade mobs L1-L5 bilateral, 30s/bout x2bouts/level; Manual lumbar traction with pt in hooklying and belt around BLE 30s hold, 20s relax x 3 bouts; Double knee to chest stretch x 30s; Thoracolumbar rotation stretches x 30s each;     Pt educated throughout session about proper posture and technique with exercises. Improved exercise technique, movement at target joints, use of target muscles after min to mod verbal, visual, tactile cues.   Pt reports significant pain this morning so today's session focused on pain management. Heavy focus on lumbar mobilizations and soft tissue mobilization of lumbar paraspinals. Introduced manual lumbar traction with belt assist and pt reports improved pain at end of session. Will progress strengthening once pain is under better control. She has yet to achieve maximal benefit from physical therapy and pt will benefit fromadditionalPT services to address deficits in back pain and improve pain-free function at home.                             PT Long Term Goals - 11/03/20 2117      PT LONG TERM GOAL #  1   Title Pt. will perform 5xSTS in 12 seconds or less to demonstrate improvements in LE strength and functional mobility.    Baseline 11/16: 19.4s; 08/03/20: 11.5s    Time 8    Period Weeks    Status Achieved      PT LONG TERM GOAL #2   Title Pt. will report ability to vaccuum for greater than 10 mins with no increase in back pain over 2/10 to improve pain free functional mobility.    Baseline 11/16: pt is currently not able to vaccuum for 4-5 minutes with back pain at 5/10; 08/03/20: Pt can vaccuum for 4-5 minutes, no pain but fatigue, rest for a minute and continue. 2/15 pt had to stop 3 times while vacuuming and rest for a minute, reported discomfort with activity. Biggest  complaint of numbness    Time 8    Period Weeks    Status Deferred    Target Date 12/28/20      PT LONG TERM GOAL #3   Title Pt. will improve FOTO to 50 to demonstrate improvement in pain free functional mobility.    Baseline 11/16: 35; 08/03/20: 63    Time 4    Period Weeks    Status Achieved      PT LONG TERM GOAL #4   Title Pt. independent with HEP to increase B hip strength 1/2 muscle grade to improve standing tolerance/ pain-free household tasks.    Baseline Pt. demonstrates 5/5 strength bilaterally, except for bilat hip IR, ER, and flexion which scored 4/5 for bilat for each. 08/03/20: R/L Hip flexion: 4+/4+, hip extension: 4/4, hip abduction: 4+/4+, hip adduction: 4+/4; deferred to next session    Time 8    Period Weeks    Status Deferred    Target Date 12/28/20      PT LONG TERM GOAL #5   Title Patient will report and demonstrate the ability to perform finalized HEP independently, as well as an understanding on how to progress program to continue to maximize strength and function.    Time 8    Period Weeks    Status On-going    Target Date 12/28/20                 Plan - 11/16/20 1511    Clinical Impression Statement Pt reports significant pain this morning so today's session focused on pain management. Heavy focus on lumbar mobilizations and soft tissue mobilization of lumbar paraspinals. Introduced manual lumbar traction with belt assist and pt reports improved pain at end of session. Will progress strengthening once pain is under better control. She has yet to achieve maximal benefit from physical therapy and pt will benefit from additional PT services to address deficits in back pain and improve pain-free function at home.    Personal Factors and Comorbidities Fitness;Past/Current Experience;Time since onset of injury/illness/exacerbation    Examination-Activity Limitations Bed Mobility;Lift;Stairs;Squat;Stand;Transfers;Sit    Examination-Participation  Restrictions Community Activity;Yard Work    Stability/Clinical Decision Making Stable/Uncomplicated    Rehab Potential Fair    PT Frequency 2x / week    PT Duration 8 weeks    PT Treatment/Interventions ADLs/Self Care Home Management;Aquatic Therapy;Cryotherapy;Gait training;Stair training;Moist Heat;Functional mobility training;Therapeutic activities;Therapeutic exercise;Balance training;Neuromuscular re-education;Patient/family education;Manual techniques;Dry needling;Electrical Stimulation;Spinal Manipulations;Joint Manipulations    PT Next Visit Plan prone manual tx, adominal and low back strengthening;    PT Home Exercise Plan Medbridge Access Code: [email or web address]    Consulted and Agree with Plan of Care Patient  Patient will benefit from skilled therapeutic intervention in order to improve the following deficits and impairments:  Abnormal gait,Decreased activity tolerance,Decreased endurance,Decreased mobility,Decreased range of motion,Hypomobility,Difficulty walking,Decreased strength,Increased muscle spasms,Impaired perceived functional ability,Pain  Visit Diagnosis: Chronic bilateral low back pain, unspecified whether sciatica present  Decreased strength     Problem List Patient Active Problem List   Diagnosis Date Noted  . SVT (supraventricular tachycardia) ([place]) 11/07/2020  . Counseling on health promotion and disease prevention 10/24/2020  . Palpitations 08/25/2020  . DOE (dyspnea on exertion) 08/25/2020  . Mixed hyperlipidemia 08/25/2020  . History of palpitations 07/05/2020  . Shortness of breath 07/05/2020  . Chronic bilateral low back pain without sciatica 06/06/2020  . Lumbar facet arthropathy 06/06/2020  . Myofascial pain 06/06/2020  . Chronic pain syndrome 06/06/2020  . Cannabis use disorder, mild, abuse 06/06/2020  . Fibromyalgia 06/06/2020  . Elevated lipids 03/09/2020  . Encounter to establish care 02/22/2020  . Chronic abdominal pain 02/22/2020  .  Hot flashes 02/22/2020  . Chronic back pain 02/22/2020  . Anxiety 02/22/2020   Phillips Grout PT, DPT, GCS  Mearle Drew 11/16/2020, 4:09 PM  [facility] [address] [place], [place], [address] Phone: [phone]   Fax:  [phone]  Name: Amber Livingston MRN: [id number] Date of Birth: 04/22/1961

## 2020-11-20 ENCOUNTER — Other Ambulatory Visit: Payer: Self-pay

## 2020-11-20 ENCOUNTER — Institutional Professional Consult (permissible substitution): Payer: No Typology Code available for payment source | Admitting: Pulmonary Disease

## 2020-11-20 NOTE — Patient Instructions (Incomplete)
TREATMENT   Manual Therapy Moist heat to lumbar spinein prone during interval history; STM tolumbar region bilateral paraspinals,quadratus lumborum,and glutMax/med/piriformis muscles utilizingpercussive STM with Hypervolt. Utilized IASTM to bilateral lumbar paraspinals, also performed myofascial release to lumbar spine bilaterally; Grade I-II CPA grade mobs L1-L5, 30s/bout x2bouts/level; Grade I-II UPA grade mobs L1-L5 bilateral, 30s/bout x2bouts/level; Manual lumbar traction with pt in hooklying and belt around BLE 30s hold, 20s relax x 3 bouts; Double knee to chest stretch x 30s; Thoracolumbar rotation stretches x 30s each;    Pt educated throughout session about proper posture and technique with exercises. Improved exercise technique, movement at target joints, use of target muscles after min to mod verbal, visual, tactile cues.   Pt reports significant pain this morning so today's session focused on pain management. Heavy focus on lumbar mobilizations and soft tissue mobilization of lumbar paraspinals. Introduced manual lumbar traction with belt assist and pt reports improved pain at end of session. Will progress strengthening once pain is under better control. She has yet to achieve maximal benefit from physical therapy and pt will benefit fromadditionalPT services to address deficits in back pain and improve pain-free function at home.

## 2020-11-21 ENCOUNTER — Other Ambulatory Visit: Payer: Self-pay

## 2020-11-21 ENCOUNTER — Ambulatory Visit
Payer: No Typology Code available for payment source | Attending: Student in an Organized Health Care Education/Training Program

## 2020-11-21 DIAGNOSIS — R531 Weakness: Secondary | ICD-10-CM | POA: Insufficient documentation

## 2020-11-21 DIAGNOSIS — G8929 Other chronic pain: Secondary | ICD-10-CM | POA: Insufficient documentation

## 2020-11-21 DIAGNOSIS — M545 Low back pain, unspecified: Secondary | ICD-10-CM | POA: Insufficient documentation

## 2020-11-21 NOTE — Therapy (Signed)
[facility] [address]. [place], [place], [address] Phone: [phone]   Fax:  [phone]  Physical Therapy Treatment  Patient Details  Name: Amber Livingston MRN: [id number] Date of Birth: 1961/04/16  Referring Provider (PT): Dr. Gillis Santa   Encounter Date: 11/21/2020   PT End of Session - 11/21/20 1319    Visit Number 27    Number of Visits 41    Date for PT Re-Evaluation 12/28/20    PT Start Time 1300    PT Stop Time 1345    PT Time Calculation (min) 45 min    Activity Tolerance Patient tolerated treatment well    Behavior During Therapy [facility] for tasks assessed/performed           Past Medical History:  Diagnosis Date  . Uterine fibroid     Past Surgical History:  Procedure Laterality Date  . ablation    . APPENDECTOMY    . DILATION AND CURETTAGE OF UTERUS     x3 for miscarriage  . LAPAROSCOPY     Fibroid removal  . PALPITATION      There were no vitals filed for this visit.   Subjective Assessment - 11/21/20 1321    Subjective Pt reports that she has had a lot of pain over the last few days. She has struggled to get out of bed and eat because of the pain. She had 4/10 pain in her low back earlier this morning but it is better now and she rates it as a 2/10. No specific questions upon arrival today.    Pertinent History fibromyalgia    Patient Stated Goals return to work, improve strength and mobility, decrease pain    Currently in Pain? Yes    Pain Score 2     Pain Location Back    Pain Orientation Right;Left    Pain Descriptors / Indicators Aching    Pain Type Chronic pain    Pain Onset More than a month ago    Pain Frequency Intermittent             TREATMENT   Manual Therapy Moist heat to lumbar spinein prone during interval history; STM tolumbar region bilateral paraspinals,quadratus lumborum,and glutMax/med/piriformis muscles utilizingpercussive STM with Hypervolt.  Grade I-II CPA  grade mobs L1-L5, 30s/bout x2bouts/level; Grade I-II UPA grade mobs L1-L5 bilateral, 30s/bout x2bouts/level;   Ther-ex  Prone straight and bent knee hip extension x10 BLE each; Prone hamstring curls with manual resistance from therapist x10 BLE each; Quadruped cat/camel x 10 each direction; Child's pose stretch 30 second hold x 2 intermittently between exercises, intermittent overpressure into flexion by therapist,  Quadruped alternating LE kick backs x10 BLE, tactile cues from therapist for core stability; Hooklying body scan x 5 minutes; Hooklying progressive muscle relaxation x 5 minutes;   Pt educated throughout session about proper posture and technique with exercises. Improved exercise technique, movement at target joints, use of target muscles after min to mod verbal, visual, tactile cues.   Pt reports significant pain this morning but improved since that time so reintroduced additional strengthening into session again today. Also performed body scan and progressive muscle relaxation activities with pt to work on relaxing muscles to make strengthening more effective and to decrease pain. Will progress strengthening as pain improves. She has yet to achieve maximal benefit from physical therapy and pt will benefit fromadditionalPT services to address deficits in back pain and improve pain-free function at home.  PT Long Term Goals - 11/03/20 2117      PT LONG TERM GOAL #1   Title Pt. will perform 5xSTS in 12 seconds or less to demonstrate improvements in LE strength and functional mobility.    Baseline 11/16: 19.4s; 08/03/20: 11.5s    Time 8    Period Weeks    Status Achieved      PT LONG TERM GOAL #2   Title Pt. will report ability to vaccuum for greater than 10 mins with no increase in back pain over 2/10 to improve pain free functional mobility.    Baseline 11/16: pt is currently not able to vaccuum for 4-5  minutes with back pain at 5/10; 08/03/20: Pt can vaccuum for 4-5 minutes, no pain but fatigue, rest for a minute and continue. 2/15 pt had to stop 3 times while vacuuming and rest for a minute, reported discomfort with activity. Biggest complaint of numbness    Time 8    Period Weeks    Status Deferred    Target Date 12/28/20      PT LONG TERM GOAL #3   Title Pt. will improve FOTO to 50 to demonstrate improvement in pain free functional mobility.    Baseline 11/16: 35; 08/03/20: 63    Time 4    Period Weeks    Status Achieved      PT LONG TERM GOAL #4   Title Pt. independent with HEP to increase B hip strength 1/2 muscle grade to improve standing tolerance/ pain-free household tasks.    Baseline Pt. demonstrates 5/5 strength bilaterally, except for bilat hip IR, ER, and flexion which scored 4/5 for bilat for each. 08/03/20: R/L Hip flexion: 4+/4+, hip extension: 4/4, hip abduction: 4+/4+, hip adduction: 4+/4; deferred to next session    Time 8    Period Weeks    Status Deferred    Target Date 12/28/20      PT LONG TERM GOAL #5   Title Patient will report and demonstrate the ability to perform finalized HEP independently, as well as an understanding on how to progress program to continue to maximize strength and function.    Time 8    Period Weeks    Status On-going    Target Date 12/28/20                 Plan - 11/21/20 1323    Clinical Impression Statement Pt reports significant pain this morning but improved since that time so reintroduced additional strengthening into session again today. Also performed body scan and progressive muscle relaxation activities with pt to work on relaxing muscles to make strengthening more effective and to decrease pain. Will progress strengthening as pain improves. She has yet to achieve maximal benefit from physical therapy and pt will benefit from additional PT services to address deficits in back pain and improve pain-free function at home.     Personal Factors and Comorbidities Fitness;Past/Current Experience;Time since onset of injury/illness/exacerbation    Examination-Activity Limitations Bed Mobility;Lift;Stairs;Squat;Stand;Transfers;Sit    Examination-Participation Restrictions Community Activity;Yard Work    Stability/Clinical Decision Making Stable/Uncomplicated    Rehab Potential Fair    PT Frequency 2x / week    PT Duration 8 weeks    PT Treatment/Interventions ADLs/Self Care Home Management;Aquatic Therapy;Cryotherapy;Gait training;Stair training;Moist Heat;Functional mobility training;Therapeutic activities;Therapeutic exercise;Balance training;Neuromuscular re-education;Patient/family education;Manual techniques;Dry needling;Electrical Stimulation;Spinal Manipulations;Joint Manipulations    PT Next Visit Plan prone manual tx, adominal and low back strengthening;    PT [place] Access  Code: [email or web address]    Consulted and Agree with Plan of Care Patient           Patient will benefit from skilled therapeutic intervention in order to improve the following deficits and impairments:  Abnormal gait,Decreased activity tolerance,Decreased endurance,Decreased mobility,Decreased range of motion,Hypomobility,Difficulty walking,Decreased strength,Increased muscle spasms,Impaired perceived functional ability,Pain  Visit Diagnosis: Chronic bilateral low back pain, unspecified whether sciatica present  Decreased strength     Problem List Patient Active Problem List   Diagnosis Date Noted  . SVT (supraventricular tachycardia) ([place]) 11/07/2020  . Counseling on health promotion and disease prevention 10/24/2020  . Palpitations 08/25/2020  . DOE (dyspnea on exertion) 08/25/2020  . Mixed hyperlipidemia 08/25/2020  . History of palpitations 07/05/2020  . Shortness of breath 07/05/2020  . Chronic bilateral low back pain without sciatica 06/06/2020  . Lumbar facet arthropathy 06/06/2020  . Myofascial pain  06/06/2020  . Chronic pain syndrome 06/06/2020  . Cannabis use disorder, mild, abuse 06/06/2020  . Fibromyalgia 06/06/2020  . Elevated lipids 03/09/2020  . Encounter to establish care 02/22/2020  . Chronic abdominal pain 02/22/2020  . Hot flashes 02/22/2020  . Chronic back pain 02/22/2020  . Anxiety 02/22/2020   Phillips Grout PT, DPT, GCS  Mitul Hallowell 11/21/2020, 2:46 PM  [facility] [address] [place], [place], [address] Phone: [phone]   Fax:  [phone]  Name: Maila Dukes MRN: [id number] Date of Birth: [birth year]

## 2020-11-22 ENCOUNTER — Ambulatory Visit: Payer: Self-pay | Admitting: Licensed Clinical Social Worker

## 2020-11-22 DIAGNOSIS — F451 Undifferentiated somatoform disorder: Secondary | ICD-10-CM

## 2020-11-22 DIAGNOSIS — F419 Anxiety disorder, unspecified: Secondary | ICD-10-CM

## 2020-11-22 DIAGNOSIS — F341 Dysthymic disorder: Secondary | ICD-10-CM

## 2020-11-22 NOTE — BH Specialist Note (Signed)
Integrated Behavioral Health Follow Up In-Person Visit  MRN: [id number] Name: Amber Livingston   Total time: 60 minutes  Types of Service: Telephone visit Patient consents to telephone visit and 2 patient identifiers were used to identify patient  Interpretor:No. Interpretor Name and Language:  Subjective: Amber Livingston is a 60 y.o. female accompanied by herself Patient was referred by [name] for mental health. Patient reports the following symptoms/concerns: The patient reports that she is continuing to make progress on her journey to becoming healthier. She discussed health stressors and her diet. She noted that she is back on schedule with physical therapy and feels validated and connected to her therapist. She discussed her upcoming medical appointments. The patient noted that she has two close friend both on hospice. She reports she went to say goodbye to one of them. She explained that the trip was very hard on her emotionally and physically. She noted due to the weather when she returned she spent a few days in bed recuperating.The patient denies any suicidal or homicidal thoughts.    Duration of problem: ; Severity of problem: moderate  Objective: Mood: Euthymic and Affect: Appropriate Risk of harm to self or others: No plan to harm self or others  Life Context: Family and Social: see above` School/Work: see above Self-Care: see above Life Changes: see above  Patient and/or Family's Strengths/Protective Factors: Concrete supports in place (healthy food, safe environments, etc.) and Sense of purpose  Goals Addressed: Patient will: 1.  Reduce symptoms of: anxiety, depression, insomnia and stress  2.  Increase knowledge and/or ability of: coping skills, healthy habits, self-management skills and stress reduction  3.  Demonstrate ability to: Increase healthy adjustment to current life circumstances  Progress towards Goals: Ongoing  Interventions: Interventions utilized:   CBT Cognitive Behavioral Therapy was utilized by the clinician during today's follow up session. The clinician processed with the patient how they have been doing since the last follow-up session. The clinician provided a space for the patient to ventilate their frustrations regarding their current life circumstances. Clinician measured the patient's anxiety and depression on a numerical scale. Clinician discussed the plan for bridging the patients care during the clinician's upcoming transition from student to LCSW-A in the clinic. Clinician offered to send an e-mail to the patient with an attached [place] flyer that includes the walk- in hours and the crisis line number. Clinician explained that should the patient experience a crisis to utilize [facility] or go to the closest emergency department for help.  Standardized Assessments completed: GAD-7 and PHQ 9  GAD-7   10 PHQ-9   15  Assessment: Patient currently experiencing see above  Patient may benefit from see above  Plan: 1. Follow up with behavioral health clinician on : Will call to schedule in May. 2. Behavioral recommendations:  3. Referral(s): [place] (In Clinic) 4. "From scale of 1-10, how likely are you to follow plan?":   Lesli Albee, Student-Social Work

## 2020-11-23 ENCOUNTER — Other Ambulatory Visit: Payer: Self-pay

## 2020-11-23 ENCOUNTER — Ambulatory Visit: Payer: No Typology Code available for payment source

## 2020-11-23 DIAGNOSIS — G8929 Other chronic pain: Secondary | ICD-10-CM

## 2020-11-23 DIAGNOSIS — R531 Weakness: Secondary | ICD-10-CM

## 2020-11-23 NOTE — Therapy (Signed)
[facility] [address] [place], [place], [address] Phone: [phone]   Fax:  [phone]  Physical Therapy Treatment  Patient Details  Name: Amber Livingston MRN: [id number] Date of Birth: August 29, 1960 Referring Provider (PT): Dr. Gillis Santa   Encounter Date: 11/23/2020   PT End of Session - 11/23/20 1321    Visit Number 28    Number of Visits 41    Date for PT Re-Evaluation 12/28/20    PT Start Time 1318    PT Stop Time 1400    PT Time Calculation (min) 42 min    Activity Tolerance Patient tolerated treatment well    Behavior During Therapy [facility] for tasks assessed/performed           Past Medical History:  Diagnosis Date  . Uterine fibroid     Past Surgical History:  Procedure Laterality Date  . ablation    . APPENDECTOMY    . DILATION AND CURETTAGE OF UTERUS     x3 for miscarriage  . LAPAROSCOPY     Fibroid removal  . PALPITATION      There were no vitals filed for this visit.   Subjective Assessment - 11/23/20 1322    Subjective Pt reports that her back continues to bother her. She did notice improved relaxation after doing the body scan and progressive muscle relaxation exercises. Her back pain is 2/10 upon arrival. No specific questions upon arrival today.    Pertinent History fibromyalgia    Patient Stated Goals return to work, improve strength and mobility, decrease pain    Currently in Pain? Yes    Pain Score 2     Pain Location Back    Pain Orientation Right;Left    Pain Descriptors / Indicators Aching    Pain Type Chronic pain    Pain Onset More than a month ago    Pain Frequency Intermittent                TREATMENT   Manual Therapy Moist heat to lumbar spinein prone during interval history; STM tolumbar region bilateral paraspinals,quadratus lumborum,and glutMax/med/piriformis muscles utilizingpercussive STM with Hypervolt.  Grade I-II CPA grade mobs L1-L5,30s/bout  x2bouts/level; Grade I-II UPA grade mobs L1-L5 bilateral,30s/bout x2bouts/level;   Ther-ex  Prone straight and bent knee hip extension x10 BLE each, pt with some low back spasms during bent knee hip extension; Prone hamstring curls with manual resistance from therapist x10 BLE each; Quadruped cat/camel x 10 each direction; Child's pose stretch30second hold x 2 intermittently between exercises, intermittent overpressure into flexion by therapist,  Cobra pose 30s x 2; Quadruped alternating LE kick backs x10 BLE, tactile cues from therapist for core stability; Quadruped fire hydrants x10 BLE, tactile cues from therapist for core stability; Quadruped bird dogs x10 BLE, tactile cues from therapist for core stability; Supine SLR with transverse abdominis contraction x 10 Dead bugs x 10 BLE; Hooklying bridges x 10; Hooklying bridges with alternating marches x 10 BLE;    Pt educated throughout session about proper posture and technique with exercises. Improved exercise technique, movement at target joints, use of target muscles after min to mod verbal, visual, tactile cues.   Pt continues with significant back pain but able to progress strengthening today. She requires intermittent rest breaks and stretches during session but is able to complete all exercises as instructed. Improving abdominal and low back endurance.She has yet to achieve maximal benefit from physical therapy and pt will benefit fromadditionalPT services to address  deficits in back pain and improve pain-free function at home.                               PT Long Term Goals - 11/03/20 2117      PT LONG TERM GOAL #1   Title Pt. will perform 5xSTS in 12 seconds or less to demonstrate improvements in LE strength and functional mobility.    Baseline 11/16: 19.4s; 08/03/20: 11.5s    Time 8    Period Weeks    Status Achieved      PT LONG TERM GOAL #2   Title Pt. will report  ability to vaccuum for greater than 10 mins with no increase in back pain over 2/10 to improve pain free functional mobility.    Baseline 11/16: pt is currently not able to vaccuum for 4-5 minutes with back pain at 5/10; 08/03/20: Pt can vaccuum for 4-5 minutes, no pain but fatigue, rest for a minute and continue. 2/15 pt had to stop 3 times while vacuuming and rest for a minute, reported discomfort with activity. Biggest complaint of numbness    Time 8    Period Weeks    Status Deferred    Target Date 12/28/20      PT LONG TERM GOAL #3   Title Pt. will improve FOTO to 50 to demonstrate improvement in pain free functional mobility.    Baseline 11/16: 35; 08/03/20: 63    Time 4    Period Weeks    Status Achieved      PT LONG TERM GOAL #4   Title Pt. independent with HEP to increase B hip strength 1/2 muscle grade to improve standing tolerance/ pain-free household tasks.    Baseline Pt. demonstrates 5/5 strength bilaterally, except for bilat hip IR, ER, and flexion which scored 4/5 for bilat for each. 08/03/20: R/L Hip flexion: 4+/4+, hip extension: 4/4, hip abduction: 4+/4+, hip adduction: 4+/4; deferred to next session    Time 8    Period Weeks    Status Deferred    Target Date 12/28/20      PT LONG TERM GOAL #5   Title Patient will report and demonstrate the ability to perform finalized HEP independently, as well as an understanding on how to progress program to continue to maximize strength and function.    Time 8    Period Weeks    Status On-going    Target Date 12/28/20                 Plan - 11/23/20 1321    Clinical Impression Statement Pt continues with significant back pain but able to progress strengthening today. She requires intermittent rest breaks and stretches during session but is able to complete all exercises as instructed. Improving abdominal and low back endurance. She has yet to achieve maximal benefit from physical therapy and pt will benefit from  additional PT services to address deficits in back pain and improve pain-free function at home.    Personal Factors and Comorbidities Fitness;Past/Current Experience;Time since onset of injury/illness/exacerbation    Examination-Activity Limitations Bed Mobility;Lift;Stairs;Squat;Stand;Transfers;Sit    Examination-Participation Restrictions Community Activity;Yard Work    Stability/Clinical Decision Making Stable/Uncomplicated    Rehab Potential Fair    PT Frequency 2x / week    PT Duration 8 weeks    PT Treatment/Interventions ADLs/Self Care Home Management;Aquatic Therapy;Cryotherapy;Gait training;Stair training;Moist Heat;Functional mobility training;Therapeutic activities;Therapeutic exercise;Balance training;Neuromuscular re-education;Patient/family education;Manual techniques;Dry needling;Electrical Stimulation;Spinal  Manipulations;Joint Manipulations    PT Next Visit Plan prone manual tx, adominal and low back strengthening;    PT Home Exercise Plan [facility] Access Code: [email or web address]    Consulted and Agree with Plan of Care Patient           Patient will benefit from skilled therapeutic intervention in order to improve the following deficits and impairments:  Abnormal gait,Decreased activity tolerance,Decreased endurance,Decreased mobility,Decreased range of motion,Hypomobility,Difficulty walking,Decreased strength,Increased muscle spasms,Impaired perceived functional ability,Pain  Visit Diagnosis: Chronic bilateral low back pain, unspecified whether sciatica present  Decreased strength     Problem List Patient Active Problem List   Diagnosis Date Noted  . SVT (supraventricular tachycardia) ([place]) 11/07/2020  . Counseling on health promotion and disease prevention 10/24/2020  . Palpitations 08/25/2020  . DOE (dyspnea on exertion) 08/25/2020  . Mixed hyperlipidemia 08/25/2020  . History of palpitations 07/05/2020  . Shortness of breath 07/05/2020  . Chronic bilateral low back  pain without sciatica 06/06/2020  . Lumbar facet arthropathy 06/06/2020  . Myofascial pain 06/06/2020  . Chronic pain syndrome 06/06/2020  . Cannabis use disorder, mild, abuse 06/06/2020  . Fibromyalgia 06/06/2020  . Elevated lipids 03/09/2020  . Encounter to establish care 02/22/2020  . Chronic abdominal pain 02/22/2020  . Hot flashes 02/22/2020  . Chronic back pain 02/22/2020  . Anxiety 02/22/2020   Phillips Grout PT, DPT, GCS  Kamilia Carollo 11/23/2020, 2:08 PM  [facility] [address] [place], [place], [address] Phone: [phone]   Fax:  [phone]  Name: Amber Livingston MRN: [id number] Date of Birth: [birth year]

## 2020-11-28 ENCOUNTER — Ambulatory Visit: Payer: No Typology Code available for payment source

## 2020-11-29 NOTE — Patient Instructions (Incomplete)
TREATMENT   Manual Therapy Moist heat to lumbar spinein prone during interval history; STM tolumbar region bilateral paraspinals,quadratus lumborum,and glutMax/med/piriformis muscles utilizingpercussive STM with Hypervolt.  Grade I-II CPA grade mobs L1-L5,30s/bout x2bouts/level; Grade I-II UPA grade mobs L1-L5 bilateral,30s/bout x2bouts/level;   Ther-ex Prone straight and bent knee hip extension x10 BLE each, pt with some low back spasms during bent knee hip extension; Prone hamstring curls with manual resistance from therapist x10 BLE each; Quadruped cat/camel x 10 each direction; Child's pose stretch30second holdx 2intermittently between exercises, intermittent overpressure into flexion by therapist,  Cobra pose 30s x 2; Quadrupedalternating LE kick backsx10BLE, tactile cues from therapist for core stability; Quadrupedfire hydrantsx10BLE, tactile cues from therapist for core stability; Quadrupedbird dogsx10BLE, tactile cues from therapist for core stability; Supine SLR with transverse abdominis contraction x 10 Dead bugs x 10 BLE; Hooklying bridges x 10; Hooklying bridges with alternating marches x 10 BLE;    Pt educated throughout session about proper posture and technique with exercises. Improved exercise technique, movement at target joints, use of target muscles after min to mod verbal, visual, tactile cues.   Pt continues with significant back pain but able to progress strengthening today. She requires intermittent rest breaks and stretches during session but is able to complete all exercises as instructed. Improving abdominal and low back endurance.She has yet to achieve maximal benefit from physical therapy and pt will benefit fromadditionalPT services to address deficits in back pain and improve pain-free function at home.

## 2020-11-30 ENCOUNTER — Ambulatory Visit: Payer: No Typology Code available for payment source

## 2020-11-30 ENCOUNTER — Telehealth: Payer: Self-pay | Admitting: Gerontology

## 2020-11-30 DIAGNOSIS — M545 Low back pain, unspecified: Secondary | ICD-10-CM

## 2020-11-30 DIAGNOSIS — R531 Weakness: Secondary | ICD-10-CM

## 2020-11-30 NOTE — Telephone Encounter (Signed)
Called pt to schedule appointment for first week of May but VM is full

## 2020-12-01 ENCOUNTER — Other Ambulatory Visit: Payer: Self-pay

## 2020-12-01 MED FILL — Albuterol Sulfate Inhal Aero 108 MCG/ACT (90MCG Base Equiv): RESPIRATORY_TRACT | 90 days supply | Qty: 25.5 | Fill #0 | Status: AC

## 2020-12-04 ENCOUNTER — Other Ambulatory Visit: Payer: Self-pay

## 2020-12-04 NOTE — Patient Instructions (Incomplete)
TREATMENT   Manual Therapy Moist heat to lumbar spinein prone during interval history; STM tolumbar region bilateral paraspinals,quadratus lumborum,and glutMax/med/piriformis muscles utilizingpercussive STM with Hypervolt.  Grade I-II CPA grade mobs L1-L5,30s/bout x2bouts/level; Grade I-II UPA grade mobs L1-L5 bilateral,30s/bout x2bouts/level;   Ther-ex Prone straight and bent knee hip extension x10 BLE each, pt with some low back spasms during bent knee hip extension; Prone hamstring curls with manual resistance from therapist x10 BLE each; Quadruped cat/camel x 10 each direction; Child's pose stretch30second holdx 2intermittently between exercises, intermittent overpressure into flexion by therapist,  Cobra pose 30s x 2; Quadrupedalternating LE kick backsx10BLE, tactile cues from therapist for core stability; Quadrupedfire hydrantsx10BLE, tactile cues from therapist for core stability; Quadrupedbird dogsx10BLE, tactile cues from therapist for core stability; Supine SLR with transverse abdominis contraction x 10 Dead bugs x 10 BLE; Hooklying bridges x 10; Hooklying bridges with alternating marches x 10 BLE;    Pt educated throughout session about proper posture and technique with exercises. Improved exercise technique, movement at target joints, use of target muscles after min to mod verbal, visual, tactile cues.   Pt continues with significant back pain but able to progress strengthening today. She requires intermittent rest breaks and stretches during session but is able to complete all exercises as instructed. Improving abdominal and low back endurance.She has yet to achieve maximal benefit from physical therapy and pt will benefit fromadditionalPT services to address deficits in back pain and improve pain-free function at home.

## 2020-12-05 ENCOUNTER — Telehealth: Payer: Self-pay

## 2020-12-05 ENCOUNTER — Telehealth: Payer: Self-pay | Admitting: *Deleted

## 2020-12-05 ENCOUNTER — Ambulatory Visit: Payer: No Typology Code available for payment source

## 2020-12-05 ENCOUNTER — Ambulatory Visit: Payer: No Typology Code available for payment source | Attending: Oncology

## 2020-12-05 DIAGNOSIS — M545 Low back pain, unspecified: Secondary | ICD-10-CM

## 2020-12-05 DIAGNOSIS — R531 Weakness: Secondary | ICD-10-CM

## 2020-12-05 NOTE — Telephone Encounter (Signed)
Was told by elizabeth to schedule appt for the first week of May. Tried calling to make appt but was unable to reach pt and could not leave voicemail. If she calls back please schedule her an appt with Benjamine Mola.

## 2020-12-07 ENCOUNTER — Other Ambulatory Visit: Payer: Self-pay

## 2020-12-07 ENCOUNTER — Ambulatory Visit: Payer: No Typology Code available for payment source

## 2020-12-07 DIAGNOSIS — G8929 Other chronic pain: Secondary | ICD-10-CM

## 2020-12-07 DIAGNOSIS — R531 Weakness: Secondary | ICD-10-CM

## 2020-12-07 NOTE — Therapy (Signed)
[facility] [address] [place], [place], [address] Phone: [phone]   Fax:  [phone]  Physical Therapy Treatment  Patient Details  Name: Amber Livingston MRN: [id number] Date of Birth: January 21, 1961 Referring Provider (PT): Dr. Gillis Santa   Encounter Date: 12/07/2020   PT End of Session - 12/07/20 1324    Visit Number 29    Number of Visits 41    Date for PT Re-Evaluation 12/28/20    PT Start Time 3419    PT Stop Time 1400    PT Time Calculation (min) 43 min    Activity Tolerance Patient tolerated treatment well    Behavior During Therapy [facility] for tasks assessed/performed           Past Medical History:  Diagnosis Date  . Uterine fibroid     Past Surgical History:  Procedure Laterality Date  . ablation    . APPENDECTOMY    . DILATION AND CURETTAGE OF UTERUS     x3 for miscarriage  . LAPAROSCOPY     Fibroid removal  . PALPITATION      There were no vitals filed for this visit.   Subjective Assessment - 12/07/20 1322    Subjective Pt reports that she has had a bad few weeks with respect her back/hip pain. Her back pain is 2/10 upon arrival. No specific questions upon arrival today.    Pertinent History fibromyalgia    Patient Stated Goals return to work, improve strength and mobility, decrease pain    Currently in Pain? Yes    Pain Score 2     Pain Location Back    Pain Orientation Right;Left    Pain Descriptors / Indicators Aching    Pain Type Chronic pain    Pain Onset More than a month ago    Pain Frequency Intermittent             TREATMENT   Manual Therapy Moist heat to lumbar spinein prone during interval history; STM tolumbar region bilateral paraspinals,quadratus lumborum,and glutMax/med/piriformis muscles utilizingpercussive STM with Hypervolt.  Grade I-II CPA grade mobs L1-L5,30s/bout x2bouts/level; Grade I-II UPA grade mobs L1-L5 bilateral,30s/bout  x2bouts/level;   Ther-ex Prone straight and bent knee hip extension x10 BLE each, pt with some low back spasms during bent knee hip extension; Prone hamstring curls with manual resistance from therapist x10 BLE each; Quadruped cat/camel x 10 each direction; Child's pose stretch30second holdx 2intermittently between exercises, intermittent overpressure into flexion by therapist,  Cobra pose 30s x 2; Quadrupedfire hydrantsx10BLE, tactile cues from therapist for core stability; Quadrupedbird dogsx10BLE, tactile cues from therapist for core stability; Prone knee planks 15s, 2 x 30s; Supine SLR with transverse abdominis contraction x 10   Pt educated throughout session about proper posture and technique with exercises. Improved exercise technique, movement at target joints, use of target muscles after min to mod verbal, visual, tactile cues.   Pt continues with significant back pain but able to progress strengthening again today. She does report soreness at the end of the session and requires intermittent rest breaks and stretches during session but is able to complete all exercises as instructed. Improving abdominal and low back endurance. She is able to perform knee planks during session today.She has yet to achieve maximal benefit from physical therapy and pt will benefit fromadditionalPT services to address deficits in back pain and improve pain-free function at home.  PT Long Term Goals - 11/03/20 2117      PT LONG TERM GOAL #1   Title Pt. will perform 5xSTS in 12 seconds or less to demonstrate improvements in LE strength and functional mobility.    Baseline 11/16: 19.4s; 08/03/20: 11.5s    Time 8    Period Weeks    Status Achieved      PT LONG TERM GOAL #2   Title Pt. will report ability to vaccuum for greater than 10 mins with no increase in back pain over 2/10 to improve pain free functional mobility.     Baseline 11/16: pt is currently not able to vaccuum for 4-5 minutes with back pain at 5/10; 08/03/20: Pt can vaccuum for 4-5 minutes, no pain but fatigue, rest for a minute and continue. 2/15 pt had to stop 3 times while vacuuming and rest for a minute, reported discomfort with activity. Biggest complaint of numbness    Time 8    Period Weeks    Status Deferred    Target Date 12/28/20      PT LONG TERM GOAL #3   Title Pt. will improve FOTO to 50 to demonstrate improvement in pain free functional mobility.    Baseline 11/16: 35; 08/03/20: 63    Time 4    Period Weeks    Status Achieved      PT LONG TERM GOAL #4   Title Pt. independent with HEP to increase B hip strength 1/2 muscle grade to improve standing tolerance/ pain-free household tasks.    Baseline Pt. demonstrates 5/5 strength bilaterally, except for bilat hip IR, ER, and flexion which scored 4/5 for bilat for each. 08/03/20: R/L Hip flexion: 4+/4+, hip extension: 4/4, hip abduction: 4+/4+, hip adduction: 4+/4; deferred to next session    Time 8    Period Weeks    Status Deferred    Target Date 12/28/20      PT LONG TERM GOAL #5   Title Patient will report and demonstrate the ability to perform finalized HEP independently, as well as an understanding on how to progress program to continue to maximize strength and function.    Time 8    Period Weeks    Status On-going    Target Date 12/28/20                 Plan - 12/07/20 1324    Clinical Impression Statement Pt continues with significant back pain but able to progress strengthening again today. She does report soreness at the end of the session and requires intermittent rest breaks and stretches during session but is able to complete all exercises as instructed. Improving abdominal and low back endurance. She is able to perform knee planks during session today. She has yet to achieve maximal benefit from physical therapy and pt will benefit from additional PT services to  address deficits in back pain and improve pain-free function at home.    Personal Factors and Comorbidities Fitness;Past/Current Experience;Time since onset of injury/illness/exacerbation    Examination-Activity Limitations Bed Mobility;Lift;Stairs;Squat;Stand;Transfers;Sit    Examination-Participation Restrictions Community Activity;Yard Work    Stability/Clinical Decision Making Stable/Uncomplicated    Rehab Potential Fair    PT Frequency 2x / week    PT Duration 8 weeks    PT Treatment/Interventions ADLs/Self Care Home Management;Aquatic Therapy;Cryotherapy;Gait training;Stair training;Moist Heat;Functional mobility training;Therapeutic activities;Therapeutic exercise;Balance training;Neuromuscular re-education;Patient/family education;Manual techniques;Dry needling;Electrical Stimulation;Spinal Manipulations;Joint Manipulations    PT Next Visit Plan Update outcome measures, progress note, prone manual tx, adominal and  low back strengthening;    PT Home Exercise Plan Medbridge Access Code: [email or web address]    Consulted and Agree with Plan of Care Patient           Patient will benefit from skilled therapeutic intervention in order to improve the following deficits and impairments:  Abnormal gait,Decreased activity tolerance,Decreased endurance,Decreased mobility,Decreased range of motion,Hypomobility,Difficulty walking,Decreased strength,Increased muscle spasms,Impaired perceived functional ability,Pain  Visit Diagnosis: Chronic bilateral low back pain, unspecified whether sciatica present  Decreased strength     Problem List Patient Active Problem List   Diagnosis Date Noted  . SVT (supraventricular tachycardia) ([place]) 11/07/2020  . Counseling on health promotion and disease prevention 10/24/2020  . Palpitations 08/25/2020  . DOE (dyspnea on exertion) 08/25/2020  . Mixed hyperlipidemia 08/25/2020  . History of palpitations 07/05/2020  . Shortness of breath 07/05/2020  . Chronic  bilateral low back pain without sciatica 06/06/2020  . Lumbar facet arthropathy 06/06/2020  . Myofascial pain 06/06/2020  . Chronic pain syndrome 06/06/2020  . Cannabis use disorder, mild, abuse 06/06/2020  . Fibromyalgia 06/06/2020  . Elevated lipids 03/09/2020  . Encounter to establish care 02/22/2020  . Chronic abdominal pain 02/22/2020  . Hot flashes 02/22/2020  . Chronic back pain 02/22/2020  . Anxiety 02/22/2020   Phillips Grout PT, DPT, GCS  Amber Livingston 12/07/2020, 8:51 PM  [facility] [address] [place], [place], [address] Phone: [phone]   Fax:  [phone]  Name: Amber Livingston MRN: [id number] Date of Birth: [birth year]

## 2020-12-11 NOTE — Patient Instructions (Incomplete)
Physical Therapy Progress Note (update outcome measures, goals (5TSTS, FOTO, vacuuming tolerance)  Approved for discount through 02/18/21  Dates of reporting period  10/12/20   to   12/12/20  TREATMENT   Manual Therapy Moist heat to lumbar spinein prone during interval history; STM tolumbar region bilateral paraspinals,quadratus lumborum,and glutMax/med/piriformis muscles utilizingpercussive STM with Hypervolt.  Grade I-II CPA grade mobs L1-L5,30s/bout x2bouts/level; Grade I-II UPA grade mobs L1-L5 bilateral,30s/bout x2bouts/level;   Ther-ex Prone straight and bent knee hip extension x10 BLE each, pt with some low back spasms during bent knee hip extension; Prone hamstring curls with manual resistance from therapist x10 BLE each; Quadruped cat/camel x 10 each direction; Child's pose stretch30second holdx 2intermittently between exercises, intermittent overpressure into flexion by therapist, Cobra pose 30s x 2; Quadrupedfire hydrantsx10BLE, tactile cues from therapist for core stability; Quadrupedbird dogsx10BLE, tactile cues from therapist for core stability; Prone knee planks 15s, 2 x 30s; Supine SLR withtransverse abdominis contractionx 10   Pt educated throughout session about proper posture and technique with exercises. Improved exercise technique, movement at target joints, use of target muscles after min to mod verbal, visual, tactile cues.   Ptcontinues with significant back pain but able to progress strengthening again today. She does report soreness at the end of the session and requires intermittent rest breaks and stretches during session but is able to complete all exercises as instructed. Improving abdominal and low back endurance. She is able to perform knee planks during session today.She has yet to achieve maximal benefit from physical therapy and pt will benefit fromadditionalPT services to address deficits in back pain and  improve pain-free function at home.

## 2020-12-12 ENCOUNTER — Ambulatory Visit: Payer: No Typology Code available for payment source

## 2020-12-12 DIAGNOSIS — M545 Low back pain, unspecified: Secondary | ICD-10-CM

## 2020-12-12 DIAGNOSIS — R531 Weakness: Secondary | ICD-10-CM

## 2020-12-14 ENCOUNTER — Ambulatory Visit: Payer: No Typology Code available for payment source

## 2020-12-14 ENCOUNTER — Other Ambulatory Visit: Payer: Self-pay

## 2020-12-14 ENCOUNTER — Encounter: Payer: Self-pay | Admitting: Pulmonary Disease

## 2020-12-14 ENCOUNTER — Ambulatory Visit (INDEPENDENT_AMBULATORY_CARE_PROVIDER_SITE_OTHER): Payer: No Typology Code available for payment source | Admitting: Pulmonary Disease

## 2020-12-14 VITALS — BP 122/68 | HR 78 | Temp 97.6°F | Ht 67.0 in | Wt 119.4 lb

## 2020-12-14 DIAGNOSIS — R0602 Shortness of breath: Secondary | ICD-10-CM

## 2020-12-14 DIAGNOSIS — J432 Centrilobular emphysema: Secondary | ICD-10-CM

## 2020-12-14 DIAGNOSIS — G8929 Other chronic pain: Secondary | ICD-10-CM

## 2020-12-14 DIAGNOSIS — R911 Solitary pulmonary nodule: Secondary | ICD-10-CM

## 2020-12-14 DIAGNOSIS — R531 Weakness: Secondary | ICD-10-CM

## 2020-12-14 MED ORDER — SPIRIVA RESPIMAT 2.5 MCG/ACT IN AERS: 2.0000 | INHALATION_SPRAY | Freq: Every day | RESPIRATORY_TRACT | 0 refills | Status: DC

## 2020-12-14 NOTE — Therapy (Signed)
[facility] [address] [place], [place], [address] Phone: [phone]   Fax:  [phone]  Physical Therapy Treatment/Progress Note         Dates of reporting period  10/12/20   to   12/14/20   Patient Details  Name: Amber Livingston MRN: [id number] Date of Birth: 12/21/1960 Referring Provider (PT): Dr. Gillis Santa   Encounter Date: 12/14/2020   PT End of Session - 12/14/20 1429    Visit Number 30    Number of Visits 41    Date for PT Re-Evaluation 12/28/20    PT Start Time 1315    PT Stop Time 1400    PT Time Calculation (min) 45 min    Activity Tolerance Patient tolerated treatment well    Behavior During Therapy [facility] for tasks assessed/performed           Past Medical History:  Diagnosis Date  . Arthritis   . Fibromyalgia    Stage 7  . Uterine fibroid     Past Surgical History:  Procedure Laterality Date  . ablation    . APPENDECTOMY    . DILATION AND CURETTAGE OF UTERUS     x3 for miscarriage  . LAPAROSCOPY     Fibroid removal  . PALPITATION      There were no vitals filed for this visit.   Subjective Assessment - 12/14/20 1328    Subjective Pt reports that she has had a rough time with her back over the last couple days. She has been down since the passing of her friend. Her back pain is 2/10 upon arrival today. No specific questions upon arrival today.    Pertinent History fibromyalgia    Patient Stated Goals return to work, improve strength and mobility, decrease pain    Currently in Pain? Yes    Pain Score 2     Pain Location Back    Pain Orientation Right;Left    Pain Descriptors / Indicators Aching    Pain Type Chronic pain    Pain Onset More than a month ago                   TREATMENT   Manual Therapy Moist heat to lumbar spinein prone during interval history; Updated goals with patient; Hip strength: R/L hip flexion: 4+/4+, hip extension: 4/4, hip abduction: 4+/4+, hip  adduction: 4+/4+; IASTM tolumbar region bilateral paraspinals andquadratus lumborum; Grade I-II CPA grade mobs L1-L5,30s/bout x2bouts/level; Grade I-II UPA grade mobs L1-L5 bilateral,30s/bout x2bouts/level;   Ther-ex Prone straight knee hip extension 2 x10 BLE each; Hooklying ant/post pelvic tilts 5s hold x 10 each; Hooklying lateral pelvic tilts 5s hold x 10 each; Hooklying lumbar rotation 5s hold x 10 each direction;   Pt educated throughout session about proper posture and technique with exercises. Improved exercise technique, movement at target joints, use of target muscles after min to mod verbal, visual, tactile cues.   Ptreports that her back pain has been aggravated this week. Updated outcome measures and goals with patient today. Her hip/back pain has improved since starting with therapy however she has significant fluctuation in her symptoms. Her hip strength has also improved since starting with therapy. She is able to vacuum around the house however her biggest limitation now is related to her DOE and not her back pain specifically.She has yet to achieve maximal benefit from physical therapy and pt will benefit fromadditionalPT services to address deficits in back pain and improve pain-free  function at home.                            PT Long Term Goals - 12/14/20 1318      PT LONG TERM GOAL #1   Title Pt. will perform 5xSTS in 12 seconds or less to demonstrate improvements in LE strength and functional mobility.    Baseline 11/16: 19.4s; 08/03/20: 11.5s    Time 8    Period Weeks    Status Achieved      PT LONG TERM GOAL #2   Title Pt. will report ability to vaccuum for greater than 10 mins with no increase in back pain over 2/10 to improve pain free functional mobility.    Baseline 11/16: pt is currently not able to vaccuum for 4-5 minutes with back pain at 5/10; 08/03/20: Pt can vaccuum for 4-5 minutes, no pain but fatigue, rest  for a minute and continue. 2/15 pt had to stop 3 times while vacuuming and rest for a minute, reported discomfort with activity. Biggest complaint of numbness; 12/14/20: Pt reports that her DOE is more limiting than her back pain at this time with respect to vacuuming    Time 8    Period Weeks    Status Partially Met    Target Date 12/28/20      PT LONG TERM GOAL #3   Title Pt. will improve FOTO to 50 to demonstrate improvement in pain free functional mobility.    Baseline 11/16: 35; 08/03/20: 63;    Time 4    Period Weeks    Status Achieved      PT LONG TERM GOAL #4   Title Pt. independent with HEP to increase B hip strength 1/2 muscle grade to improve standing tolerance/ pain-free household tasks.    Baseline Pt. demonstrates 5/5 strength bilaterally, except for bilat hip IR, ER, and flexion which scored 4/5 for bilat for each. 08/03/20: R/L Hip flexion: 4+/4+, hip extension: 4/4, hip abduction: 4+/4+, hip adduction: 4+/4; deferred to next session; 12/14/20: Hip strength: R/L hip flexion: 4+/4+, hip extension: 4/4, hip abduction: 4+/4+, hip adduction: 4+/4+    Time 8    Period Weeks    Status Achieved      PT LONG TERM GOAL #5   Title Patient will report and demonstrate the ability to perform finalized HEP independently, as well as an understanding on how to progress program to continue to maximize strength and function.    Time 8    Period Weeks    Status On-going    Target Date 12/28/20                 Plan - 12/14/20 1430    Clinical Impression Statement Pt reports that her back pain has been aggravated this week. Updated outcome measures and goals with patient today. Her hip/back pain has improved since starting with therapy however she has significant fluctuation in her symptoms. Her hip strength has also improved since starting with therapy. She is able to vacuum around the house however her biggest limitation now is related to her DOE and not her back pain specifically.  She has yet to achieve maximal benefit from physical therapy and pt will benefit from additional PT services to address deficits in back pain and improve pain-free function at home.    Personal Factors and Comorbidities Fitness;Past/Current Experience;Time since onset of injury/illness/exacerbation    Examination-Activity Limitations Bed Mobility;Lift;Stairs;Squat;Stand;Transfers;Sit    Examination-Participation  Restrictions Community Activity;Yard Work    Stability/Clinical Decision Making Stable/Uncomplicated    Rehab Potential Fair    PT Frequency 2x / week    PT Duration 8 weeks    PT Treatment/Interventions ADLs/Self Care Home Management;Aquatic Therapy;Cryotherapy;Gait training;Stair training;Moist Heat;Functional mobility training;Therapeutic activities;Therapeutic exercise;Balance training;Neuromuscular re-education;Patient/family education;Manual techniques;Dry needling;Electrical Stimulation;Spinal Manipulations;Joint Manipulations    PT Next Visit Plan prone manual tx, adominal and low back strengthening;    PT Home Exercise Plan Medbridge Access Code: [email or web address]    Consulted and Agree with Plan of Care Patient           Patient will benefit from skilled therapeutic intervention in order to improve the following deficits and impairments:  Abnormal gait,Decreased activity tolerance,Decreased endurance,Decreased mobility,Decreased range of motion,Hypomobility,Difficulty walking,Decreased strength,Increased muscle spasms,Impaired perceived functional ability,Pain  Visit Diagnosis: Chronic bilateral low back pain, unspecified whether sciatica present  Decreased strength     Problem List Patient Active Problem List   Diagnosis Date Noted  . SVT (supraventricular tachycardia) ([place]) 11/07/2020  . Counseling on health promotion and disease prevention 10/24/2020  . Palpitations 08/25/2020  . DOE (dyspnea on exertion) 08/25/2020  . Mixed hyperlipidemia 08/25/2020  . History of  palpitations 07/05/2020  . Shortness of breath 07/05/2020  . Chronic bilateral low back pain without sciatica 06/06/2020  . Lumbar facet arthropathy 06/06/2020  . Myofascial pain 06/06/2020  . Chronic pain syndrome 06/06/2020  . Cannabis use disorder, mild, abuse 06/06/2020  . Fibromyalgia 06/06/2020  . Elevated lipids 03/09/2020  . Encounter to establish care 02/22/2020  . Chronic abdominal pain 02/22/2020  . Hot flashes 02/22/2020  . Chronic back pain 02/22/2020  . Anxiety 02/22/2020   Phillips Grout PT, DPT, GCS  Joelyn Lover 12/15/2020, 11:14 AM  [facility] [address] [place], [place], [address] Phone: [phone]   Fax:  [phone]  Name: Amber Livingston MRN: [id number] Date of Birth: 1960-10-09

## 2020-12-14 NOTE — Progress Notes (Signed)
Subjective:   PATIENT ID: Amber Livingston GENDER: female DOB: 12/14/1960, MRN: [id number]   HPI  Chief Complaint  Patient presents with  . Consult    Reports shortness of breath with activity and walking since July of last year.     Reason for Visit: New consult for emphysema  [name] is a 60 year old female former smoker (46 pack years) with SVT, HLD, spinal stenosis and atrial mass who presents as a new patient/consult.  In the last year, she decided to revamp her life and changed her diet and starting to see doctors. She has had shortness of breath that limits her activity including vacuuming in her home. She has other chronic issues including back pain and fibromyalgia that affect her activity.   She recently had a CT lung screen completed on 11/07/20 which demonstrated emphysema and small multiple pulmonary nodules. She quit smoking February 2022. She is still taking lozenges. She uses edibles but does not smoke marijuana. Her shortness of breath has worsened in last 9 months. No wheezing or cough. She is not on inhalers.  Social History: Former smoker. 46 pack-year. 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  Past Medical History:  Diagnosis Date  . Arthritis   . Fibromyalgia    Stage 7  . Uterine fibroid      Family History  Problem Relation Age of Onset  . Uterine cancer Mother 46  . Lung cancer Mother   . Brain cancer Mother   . Breast cancer Maternal Grandmother 60  . Breast cancer Paternal Grandmother 10     Social History   Occupational History  . Not on file  Tobacco Use  . Smoking status: Former Smoker    Packs/day: 1.00    Years: 46.00    Pack years: 46.00    Types: Cigarettes    Quit date: 10/04/2020    Years since quitting: 0.1  . Smokeless tobacco: Never Used  . Tobacco comment: verified 12/14/20   Vaping Use  . Vaping Use: Never used  Substance and Sexual Activity  . Alcohol use: Not Currently    Comment: Occ  . Drug use: Yes    Types: Marijuana  . Sexual activity: Yes    Partners: Male    No Known Allergies   Outpatient Medications Prior to Visit  Medication Sig Dispense Refill  . BEE POLLEN PO Take 1 capsule by mouth daily.    . Cyanocobalamin (B-12) 2500 MCG TABS Take by mouth daily.    Marland Kitchen D-Ribose (RIBOSE, D,) POWD 2,500 mg by Does not apply route in the morning and at bedtime.    . diphenhydramine-acetaminophen (TYLENOL PM) 25-500 MG TABS tablet Take 2 tablets by mouth at bedtime as needed.    Marland Kitchen estradiol (ESTRACE) 0.5 MG tablet Take 0.5 mg by mouth daily.    Marland Kitchen MAGNESIUM BISGLYCINATE PO Take 1,000 mg by mouth daily.    . medroxyPROGESTERone (PROVERA) 2.5 MG tablet Take 2.5 mg by mouth daily.    . NON FORMULARY as needed. CBD gummies    . PROAIR HFA 108 (90 Base) MCG/ACT inhaler INHALE 2 PUFFS BY MOUTH EVERY 6 HOURS AS NEEDED FOR WHEEZING OR SHORT OF BREATH 34 g 11  . Turmeric (QC TUMERIC COMPLEX PO) Take 1,000 mg by mouth daily.    Marland Kitchen albuterol (VENTOLIN HFA) 108 (90 Base) MCG/ACT inhaler INHALE 2 PUFFS INTO  THE LUNGS EVERY 6 HOURS AS NEEDED FOR WHEEZING OR SHORT OF BREATH (Patient not taking: Reported on 12/14/2020) 6.7 g 2  . traZODone (DESYREL) 50 MG tablet TAKE 1/2 TABLET(25MG  TOTAL) BY MOUTH AT BEDTIME 30 tablet 0  . methylPREDNISolone (MEDROL) 4 MG tablet TAKE 6 TABLETS ON DAY 1, THEN DECREASE BY 1 TABLET EACH DAY UNTIL FINISHED. 21 tablet 0   No facility-administered medications prior to visit.    Review of Systems  Constitutional: Negative for chills, diaphoresis, fever, malaise/fatigue and weight loss.  HENT: Negative for congestion, ear pain and sore throat.   Respiratory: Positive for shortness of breath. Negative for cough, hemoptysis, sputum production and wheezing.   Cardiovascular: Negative for chest pain, palpitations and leg swelling.   Gastrointestinal: Negative for abdominal pain, heartburn and nausea.  Genitourinary: Negative for frequency.  Musculoskeletal: Negative for joint pain and myalgias.  Skin: Negative for itching and rash.  Neurological: Negative for dizziness, weakness and headaches.  Endo/Heme/Allergies: Does not bruise/bleed easily.  Psychiatric/Behavioral: Negative for depression. The patient is not nervous/anxious.      Objective:   Vitals:   12/14/20 0947  BP: 122/68  Pulse: 78  Temp: 97.6 F (36.4 C)  TempSrc: Temporal  SpO2: 98%  Weight: 119 lb 6.4 oz (54.2 kg)  Height: 5\' 7"  (1.702 m)   SpO2: 98 % (RA) O2 Device: None (Room air)  Physical Exam: General: Well-appearing, no acute distress HENT: [place], AT, OP clear, MMM Eyes: EOMI, no scleral icterus Respiratory: Clear to auscultation bilaterally.  No crackles, wheezing or rales Cardiovascular: RRR, -M/R/G, no JVD GI: BS+, soft, nontender Extremities:-Edema,-tenderness Neuro: AAO x4, CNII-XII grossly intact Skin: Intact, no rashes or bruising Psych: Normal mood, normal affect  Data Reviewed:  Imaging: CT chest lung screening 11/06/2020-multiple small lung nodules with largest measuring 6.6 mm in the right lower lobe.  Background emphysema  PFT: None on file  Labs: CBC    Component Value Date/Time   WBC 4.2 09/27/2020 1042   RBC 3.99 09/27/2020 1042   HGB 12.7 09/27/2020 1042   HCT 37.5 09/27/2020 1042   PLT 321 09/27/2020 1042   MCV 94 09/27/2020 1042   MCH 31.8 09/27/2020 1042   MCHC 33.9 09/27/2020 1042   RDW 12.5 09/27/2020 1042   LYMPHSABS 2.0 03/02/2020 1846   EOSABS 0.3 03/02/2020 1846   BASOSABS 0.1 03/02/2020 1846   Absolute eos 03/02/20 300  Imaging, labs and test noted above have been reviewed independently by me.     Assessment & Plan:   Discussion: 60 year old female former smoker (46 pack-years) who presents with shortness of breath. Reviewed history and CT imaging which demonstrates mild  centrilobular and paraseptal emphysema.   Shortness of breath Can be multi factorial in setting of emphysema, fibromyalgia, deconditioning. Start treatment with bronchodilators for emphysema first. If symptoms persistent and/or uncontrolled, will evaluate with PFTs in the future  Emphysema --START Spiriva 2.5 mcg TWO puffs ONCE a day --CONTINUE Albuterol as needed for shortness of breath or wheezing  Lung nodules --Plan for 6 month CT. Call our office when this is completed to discuss  Health Maintenance Immunization History  Administered Date(s) Administered  . Moderna Sars-Covid-2 Vaccination 11/18/2019, 12/16/2019   CT Lung Screen - not indicated. CT as above  No orders of the defined types were placed in this encounter.  Meds ordered this encounter  Medications  . DISCONTD: Tiotropium Bromide Monohydrate (SPIRIVA RESPIMAT) 2.5 MCG/ACT AERS    Sig: Inhale 2 puffs into the  lungs daily.    Dispense:  4 g    Refill:  0    Order Specific Question:   Lot Number?    Answer:   [id number]    Order Specific Question:   Expiration Date?    Answer:   06/19/2021    Order Specific Question:   Quantity    Answer:   2    Return in about 1 month (around 01/13/2021).  I have spent a total time of 45-minutes on the day of the appointment reviewing prior documentation, coordinating care and discussing medical diagnosis and plan with the patient/family. Imaging, labs and tests included in this note have been reviewed and interpreted independently by me.  Isabela, MD [facility] 12/14/2020 10:09 AM  Office Number [phone]

## 2020-12-14 NOTE — Patient Instructions (Addendum)
Emphysema --START Spiriva 2.5 mcg TWO puffs ONCE a day. This is EVERYDAY inhaler. Sample provided --CONTINUE Albuterol as needed for shortness of breath or wheezing. This is RESCUE inhaler  Lung nodules --Plan for 6 month CT. Call our office when this is completed to discuss  Follow-up with me in one month

## 2020-12-19 ENCOUNTER — Ambulatory Visit: Payer: Medicaid Other | Attending: Student in an Organized Health Care Education/Training Program

## 2020-12-19 ENCOUNTER — Other Ambulatory Visit: Payer: Self-pay

## 2020-12-19 DIAGNOSIS — M545 Low back pain, unspecified: Secondary | ICD-10-CM | POA: Insufficient documentation

## 2020-12-19 DIAGNOSIS — G8929 Other chronic pain: Secondary | ICD-10-CM | POA: Diagnosis present

## 2020-12-19 DIAGNOSIS — R531 Weakness: Secondary | ICD-10-CM | POA: Insufficient documentation

## 2020-12-19 NOTE — Therapy (Signed)
[facility] [address] [place], [place], [address] Phone: [phone]   Fax:  [phone]  Physical Therapy Treatment  Patient Details  Name: Amber Livingston MRN: [id number] Date of Birth: 12-02-1960 Referring Provider (PT): Dr. Gillis Santa   Encounter Date: 12/19/2020   PT End of Session - 12/19/20 1321    Visit Number 31    Number of Visits 41    Date for PT Re-Evaluation 12/28/20    PT Start Time 8768    PT Stop Time 1400    PT Time Calculation (min) 43 min    Activity Tolerance Patient tolerated treatment well    Behavior During Therapy [facility] for tasks assessed/performed           Past Medical History:  Diagnosis Date  . Arthritis   . Fibromyalgia    Stage 7  . Uterine fibroid     Past Surgical History:  Procedure Laterality Date  . ablation    . APPENDECTOMY    . DILATION AND CURETTAGE OF UTERUS     x3 for miscarriage  . LAPAROSCOPY     Fibroid removal  . PALPITATION      There were no vitals filed for this visit.   Subjective Assessment - 12/19/20 1320    Subjective Pt reports that her back is doing better and she denies any pain upon arrival today. No specific questions or concerns currently.    Pertinent History fibromyalgia    Patient Stated Goals return to work, improve strength and mobility, decrease pain    Currently in Pain? No/denies               TREATMENT   Manual Therapy Moist heat to lumbar spinein prone during interval history; STM tolumbar region bilateral paraspinals, QL, and hips with percussive and vibratory technique using Theragun; Grade I-II CPA grade mobs L1-L5,30s/bout x2bouts/level; Grade I-II UPA grade mobs L1-L5 bilateral,30s/bout x2bouts/level;   Ther-ex Prone straight knee hip extension x10 BLE each; Quadruped cat/camel x 10 each direction; Child's pose stretch30second holdx 2intermittently between exercises, intermittent overpressure into flexion  by therapist,  Nanci Pina hydrantsx10BLE, tactile cues from therapist for core stability; Quadrupedbird dogsx10BLE, tactile cues from therapist for core stability; Hooklying ant/posterior pelvic tilt 5s hold x 10 each direction; Supine hip flexion marches with transverse abdominis contraction x 10 Supine SLR with transverse abdominis contraction x 10; Supine alternating bicycle kicks with transverse abdominis contraction x 10; Supine dead bugs x 10 on each side; Supine pball straight knee bridges x 10;   Pt educated throughout session about proper posture and technique with exercises. Improved exercise technique, movement at target joints, use of target muscles after min to mod verbal, visual, tactile cues.   Ptreports that her back has been better over the last couple days. Given relief from pain utilized session today to focus mostly on strengthening. Pt is able to complete all exercises as instructed today. She reports some initial back pain with quadruped cat/cames and childs pose stretches but resolves with repetition.She has yet to achieve maximal benefit from physical therapy and pt will benefit fromadditionalPT services to address deficits in back pain and improve pain-free function at home.                              PT Long Term Goals - 12/14/20 1318      PT LONG TERM GOAL #1   Title Pt. will perform  5xSTS in 12 seconds or less to demonstrate improvements in LE strength and functional mobility.    Baseline 11/16: 19.4s; 08/03/20: 11.5s    Time 8    Period Weeks    Status Achieved      PT LONG TERM GOAL #2   Title Pt. will report ability to vaccuum for greater than 10 mins with no increase in back pain over 2/10 to improve pain free functional mobility.    Baseline 11/16: pt is currently not able to vaccuum for 4-5 minutes with back pain at 5/10; 08/03/20: Pt can vaccuum for 4-5 minutes, no pain but fatigue, rest for a minute and  continue. 2/15 pt had to stop 3 times while vacuuming and rest for a minute, reported discomfort with activity. Biggest complaint of numbness; 12/14/20: Pt reports that her DOE is more limiting than her back pain at this time with respect to vacuuming    Time 8    Period Weeks    Status Partially Met    Target Date 12/28/20      PT LONG TERM GOAL #3   Title Pt. will improve FOTO to 50 to demonstrate improvement in pain free functional mobility.    Baseline 11/16: 35; 08/03/20: 63;    Time 4    Period Weeks    Status Achieved      PT LONG TERM GOAL #4   Title Pt. independent with HEP to increase B hip strength 1/2 muscle grade to improve standing tolerance/ pain-free household tasks.    Baseline Pt. demonstrates 5/5 strength bilaterally, except for bilat hip IR, ER, and flexion which scored 4/5 for bilat for each. 08/03/20: R/L Hip flexion: 4+/4+, hip extension: 4/4, hip abduction: 4+/4+, hip adduction: 4+/4; deferred to next session; 12/14/20: Hip strength: R/L hip flexion: 4+/4+, hip extension: 4/4, hip abduction: 4+/4+, hip adduction: 4+/4+    Time 8    Period Weeks    Status Achieved      PT LONG TERM GOAL #5   Title Patient will report and demonstrate the ability to perform finalized HEP independently, as well as an understanding on how to progress program to continue to maximize strength and function.    Time 8    Period Weeks    Status On-going    Target Date 12/28/20                 Plan - 12/19/20 1321    Clinical Impression Statement Pt reports that her back has been better over the last couple days. Given relief from pain utilized session today to focus mostly on strengthening. Pt is able to complete all exercises as instructed today. She reports some initial back pain with quadruped cat/cames and childs pose stretches but resolves with repetition. She has yet to achieve maximal benefit from physical therapy and pt will benefit from additional PT services to address  deficits in back pain and improve pain-free function at home.    Personal Factors and Comorbidities Fitness;Past/Current Experience;Time since onset of injury/illness/exacerbation    Examination-Activity Limitations Bed Mobility;Lift;Stairs;Squat;Stand;Transfers;Sit    Examination-Participation Restrictions Community Activity;Yard Work    Stability/Clinical Decision Making Stable/Uncomplicated    Rehab Potential Fair    PT Frequency 2x / week    PT Duration 8 weeks    PT Treatment/Interventions ADLs/Self Care Home Management;Aquatic Therapy;Cryotherapy;Gait training;Stair training;Moist Heat;Functional mobility training;Therapeutic activities;Therapeutic exercise;Balance training;Neuromuscular re-education;Patient/family education;Manual techniques;Dry needling;Electrical Stimulation;Spinal Manipulations;Joint Manipulations    PT Next Visit Plan prone manual tx, adominal and low back  strengthening;    PT Home Exercise Plan Medbridge Access Code: [email or web address]    Consulted and Agree with Plan of Care Patient           Patient will benefit from skilled therapeutic intervention in order to improve the following deficits and impairments:  Abnormal gait,Decreased activity tolerance,Decreased endurance,Decreased mobility,Decreased range of motion,Hypomobility,Difficulty walking,Decreased strength,Increased muscle spasms,Impaired perceived functional ability,Pain  Visit Diagnosis: Chronic bilateral low back pain, unspecified whether sciatica present  Decreased strength     Problem List Patient Active Problem List   Diagnosis Date Noted  . SVT (supraventricular tachycardia) ([place]) 11/07/2020  . Counseling on health promotion and disease prevention 10/24/2020  . Palpitations 08/25/2020  . DOE (dyspnea on exertion) 08/25/2020  . Mixed hyperlipidemia 08/25/2020  . History of palpitations 07/05/2020  . Shortness of breath 07/05/2020  . Chronic bilateral low back pain without sciatica 06/06/2020   . Lumbar facet arthropathy 06/06/2020  . Myofascial pain 06/06/2020  . Chronic pain syndrome 06/06/2020  . Cannabis use disorder, mild, abuse 06/06/2020  . Fibromyalgia 06/06/2020  . Elevated lipids 03/09/2020  . Encounter to establish care 02/22/2020  . Chronic abdominal pain 02/22/2020  . Hot flashes 02/22/2020  . Chronic back pain 02/22/2020  . Anxiety 02/22/2020   Phillips Grout PT, DPT, GCS  Jailyn Leeson 12/19/2020, 4:58 PM  [facility] [address] [place], [place], [address] Phone: [phone]   Fax:  [phone]  Name: Amber Livingston MRN: [id number] Date of Birth: 1960-10-15

## 2020-12-20 NOTE — Patient Instructions (Incomplete)
TREATMENT   Manual Therapy Moist heat to lumbar spinein prone during interval history; STM tolumbar region bilateral paraspinals, QL, and hips with percussive and vibratory technique using Theragun; Grade I-II CPA grade mobs L1-L5,30s/bout x2bouts/level; Grade I-II UPA grade mobs L1-L5 bilateral,30s/bout x2bouts/level;   Ther-ex Prone straight knee hip extensionx10 BLE each; Quadruped cat/camel x 10 each direction; Child's pose stretch30second holdx 2intermittently between exercises, intermittent overpressure into flexion by therapist, Nanci Pina hydrantsx10BLE, tactile cues from therapist for core stability; Quadrupedbird dogsx10BLE, tactile cues from therapist for core stability; Hooklying ant/posterior pelvic tilt 5s hold x 10 each direction; Supine hip flexion marches withtransverse abdominis contractionx 10 Supine SLR withtransverse abdominis contractionx 10; Supine alternating bicycle kicks withtransverse abdominis contractionx 10; Supine dead bugs x 10 on each side; Supine pball straight knee bridges x 10;   Pt educated throughout session about proper posture and technique with exercises. Improved exercise technique, movement at target joints, use of target muscles after min to mod verbal, visual, tactile cues.   Ptreports that her back has been better over the last couple days. Given relief from pain utilized session today to focus mostly on strengthening. Pt is able to complete all exercises as instructed today. She reports some initial back pain with quadruped cat/cames and childs pose stretches but resolves with repetition.She has yet to achieve maximal benefit from physical therapy and pt will benefit fromadditionalPT services to address deficits in back pain and improve pain-free function at home.

## 2020-12-21 ENCOUNTER — Ambulatory Visit: Payer: Medicaid Other

## 2020-12-21 ENCOUNTER — Other Ambulatory Visit: Payer: Self-pay

## 2020-12-21 ENCOUNTER — Ambulatory Visit: Payer: Self-pay | Admitting: Gerontology

## 2020-12-21 ENCOUNTER — Encounter: Payer: Self-pay | Admitting: Gerontology

## 2020-12-21 VITALS — BP 131/76 | HR 58 | Temp 97.3°F | Resp 18 | Ht 68.5 in | Wt 119.1 lb

## 2020-12-21 DIAGNOSIS — R0609 Other forms of dyspnea: Secondary | ICD-10-CM

## 2020-12-21 DIAGNOSIS — Z8659 Personal history of other mental and behavioral disorders: Secondary | ICD-10-CM | POA: Insufficient documentation

## 2020-12-21 DIAGNOSIS — D229 Melanocytic nevi, unspecified: Secondary | ICD-10-CM | POA: Insufficient documentation

## 2020-12-21 DIAGNOSIS — R0600 Dyspnea, unspecified: Secondary | ICD-10-CM

## 2020-12-21 DIAGNOSIS — M545 Low back pain, unspecified: Secondary | ICD-10-CM | POA: Diagnosis not present

## 2020-12-21 DIAGNOSIS — R531 Weakness: Secondary | ICD-10-CM

## 2020-12-21 MED ORDER — SPIRIVA RESPIMAT 2.5 MCG/ACT IN AERS: 2.0000 | INHALATION_SPRAY | Freq: Every day | RESPIRATORY_TRACT | 0 refills | Status: DC

## 2020-12-21 NOTE — Progress Notes (Signed)
Established Patient Office Visit  Subjective:  Patient ID: Amber Livingston, female    DOB: July 22, 1961  Age: 60 y.o. MRN: [id number]  CC:  Chief Complaint  Patient presents with  . Follow-up    HPI Amber Livingston a 60 y/o female who has a history of Arthritis, Cervical Spinal Stenosis, Fibromyalgia, Uterine Fibroid and Depression,presents for follow up visit. She was seen on 12/14/20 by Pulmonologist Dr Elie Confer with regards to her Lung CT screening that showed multiple lung nodules and was started on Spiriva. She also continues to experience shortness of breath with exertion. She also states that her depression is worsening and has many stressors. She reports that she has a disability hearing soon, and lost her friend to cancer. She denies suicidal nor homicidal ideation and she continues to adhere to Trazodone as needed for depression. She c/o a 0.5 x 0.5 cm mole to her mid chest that has increased in size since 6 months ago. She denies pain to mole when touched. Overall, she states that she's doing well and offers no further complaint.  Past Medical History:  Diagnosis Date  . Arthritis   . Cervical spinal stenosis   . Emphysema, unspecified ([place])   . Fibromyalgia    Stage 7  . Lumbar stenosis   . Uterine fibroid     Past Surgical History:  Procedure Laterality Date  . ablation    . APPENDECTOMY    . DILATION AND CURETTAGE OF UTERUS     x3 for miscarriage  . LAPAROSCOPY     Fibroid removal  . PALPITATION    . TONSILLECTOMY      Family History  Problem Relation Age of Onset  . Uterine cancer Mother 38  . Lung cancer Mother   . Brain cancer Mother   . Hypertension Mother   . Alcohol abuse Maternal Grandmother   . Breast cancer Paternal Grandmother 62  . Dementia Father   . Prostate cancer Father   . Dementia Paternal Uncle   . Dementia Paternal Grandfather     Social History   Socioeconomic History  . Marital status: Married    Spouse name: Not on file  . Number of  children: Not on file  . Years of education: Not on file  . Highest education level: Not on file  Occupational History  . Not on file  Tobacco Use  . Smoking status: Former Smoker    Packs/day: 1.00    Years: 46.00    Pack years: 46.00    Types: Cigarettes    Quit date: 10/04/2020    Years since quitting: 0.2  . Smokeless tobacco: Never Used  . Tobacco comment: verified 12/14/20  Vaping Use  . Vaping Use: Never used  Substance and Sexual Activity  . Alcohol use: Not Currently  . Drug use: Yes    Frequency: 7.0 times per week    Types: Marijuana    Comment: once daily  . Sexual activity: Not Currently    Partners: Male  Other Topics Concern  . Not on file  Social History Narrative  . Not on file   Social Determinants of Health   Financial Resource Strain: High Risk  . Difficulty of Paying Living Expenses: Very hard  Food Insecurity: No Food Insecurity  . Worried About Charity fundraiser in the Last Year: Never true  . Ran Out of Food in the Last Year: Never true  Transportation Needs: No Transportation Needs  . Lack of Transportation (Medical): No  .  Lack of Transportation (Non-Medical): No  Physical Activity: Inactive  . Days of Exercise per Week: 0 days  . Minutes of Exercise per Session: 0 min  Stress: Stress Concern Present  . Feeling of Stress : To some extent  Social Connections: Socially Isolated  . Frequency of Communication with Friends and Family: Twice a week  . Frequency of Social Gatherings with Friends and Family: Never  . Attends Religious Services: Never  . Active Member of Clubs or Organizations: No  . Attends Archivist Meetings: Never  . Marital Status: Never married  Intimate Partner Violence: Not on file    Outpatient Medications Prior to Visit  Medication Sig Dispense Refill  . BEE POLLEN PO Take 1 capsule by mouth daily.    . Cyanocobalamin (B-12) 2500 MCG TABS Take by mouth daily.    Marland Kitchen D-Ribose (RIBOSE, D,) POWD 2,500 mg by  Does not apply route in the morning and at bedtime.    . diphenhydramine-acetaminophen (TYLENOL PM) 25-500 MG TABS tablet Take 2 tablets by mouth at bedtime as needed.    Marland Kitchen estradiol (ESTRACE) 0.5 MG tablet Take 0.5 mg by mouth daily.    Marland Kitchen ibuprofen (ADVIL) 200 MG tablet Take 600 mg by mouth every 6 (six) hours as needed for moderate pain.    Marland Kitchen MAGNESIUM BISGLYCINATE PO Take 1,000 mg by mouth daily.    . medroxyPROGESTERone (PROVERA) 2.5 MG tablet Take 2.5 mg by mouth daily.    . nicotine polacrilex (COMMIT) 4 MG lozenge Take 4 mg by mouth as needed for smoking cessation.    . NON FORMULARY as needed. CBD gummies    . PROAIR HFA 108 (90 Base) MCG/ACT inhaler INHALE 2 PUFFS BY MOUTH EVERY 6 HOURS AS NEEDED FOR WHEEZING OR SHORT OF BREATH 34 g 11  . Turmeric (QC TUMERIC COMPLEX PO) Take 1,000 mg by mouth daily.    . Tiotropium Bromide Monohydrate (SPIRIVA RESPIMAT) 2.5 MCG/ACT AERS Inhale 2 puffs into the lungs daily. 4 g 0  . traZODone (DESYREL) 50 MG tablet TAKE 1/2 TABLET(25MG  TOTAL) BY MOUTH AT BEDTIME 30 tablet 0  . albuterol (VENTOLIN HFA) 108 (90 Base) MCG/ACT inhaler INHALE 2 PUFFS INTO THE LUNGS EVERY 6 HOURS AS NEEDED FOR WHEEZING OR SHORT OF BREATH 6.7 g 2   No facility-administered medications prior to visit.    No Known Allergies  ROS Review of Systems  Constitutional: Negative.   Respiratory: Positive for shortness of breath (intermittent with exertion).   Cardiovascular: Negative.   Skin:       Mole to mid chest area  Neurological: Negative.   Psychiatric/Behavioral: Positive for dysphoric mood.      Objective:    Physical Exam HENT:     Head: Normocephalic and atraumatic.  Cardiovascular:     Rate and Rhythm: Normal rate and regular rhythm.     Pulses: Normal pulses.     Heart sounds: Normal heart sounds.  Pulmonary:     Effort: Pulmonary effort is normal.     Breath sounds: Normal breath sounds.  Skin:      Neurological:     General: No focal deficit  present.     Mental Status: She is alert and oriented to person, place, and time. Mental status is at baseline.  Psychiatric:        Mood and Affect: Mood normal.        Behavior: Behavior normal.        Thought Content: Thought content normal.  Judgment: Judgment normal.     BP 131/76 (BP Location: Left Arm, Patient Position: Sitting, Cuff Size: Normal)   Pulse (!) 58   Temp (!) 97.3 F (36.3 C)   Resp 18   Ht 5' 8.5" (1.74 m)   Wt 119 lb 1.6 oz (54 kg)   SpO2 97%   BMI 17.85 kg/m  Wt Readings from Last 3 Encounters:  12/21/20 119 lb 1.6 oz (54 kg)  12/14/20 119 lb 6.4 oz (54.2 kg)  11/07/20 123 lb 9.6 oz (56.1 kg)     Health Maintenance Due  Topic Date Due  . HIV Screening  Never done  . Hepatitis C Screening  Never done  . TETANUS/TDAP  Never done  . MAMMOGRAM  Never done  . COVID-19 Vaccine (3 - Booster for Moderna series) 06/16/2020    There are no preventive care reminders to display for this patient.  Lab Results  Component Value Date   TSH 0.863 03/02/2020   Lab Results  Component Value Date   WBC 4.2 09/27/2020   HGB 12.7 09/27/2020   HCT 37.5 09/27/2020   MCV 94 09/27/2020   PLT 321 09/27/2020   Lab Results  Component Value Date   NA 141 09/27/2020   K 5.0 09/27/2020   CO2 21 09/27/2020   GLUCOSE 88 09/27/2020   BUN 11 09/27/2020   CREATININE 0.75 09/27/2020   BILITOT 0.5 03/02/2020   ALKPHOS 72 03/02/2020   AST 14 03/02/2020   ALT 23 03/02/2020   PROT 7.0 03/02/2020   ALBUMIN 4.7 03/02/2020   CALCIUM 9.7 09/27/2020   Lab Results  Component Value Date   CHOL 208 (H) 07/05/2020   Lab Results  Component Value Date   HDL 60 07/05/2020   Lab Results  Component Value Date   LDLCALC 132 (H) 07/05/2020   Lab Results  Component Value Date   TRIG 89 07/05/2020   Lab Results  Component Value Date   CHOLHDL 3.5 07/05/2020   No results found for: HGBA1C    Assessment & Plan:     1. Skin mole- -She will follow up with  Dermatologist - Ambulatory referral to Dermatology  2. History of depression - She will continue on Trazodone as needed, will follow up with [facility] Behavioral health team Ms. Pruitt H. She was advised to go to the ED with worsening su=ymptoms.  3. DOE (dyspnea on exertion) - She will continue on Spiriva, and follow up with Pulmonologist. She was advised to go to the ED with worsening symptoms. - Tiotropium Bromide Monohydrate (SPIRIVA RESPIMAT) 2.5 MCG/ACT AERS; Inhale 2 puffs into the lungs daily.  Dispense: 4 g; Refill: 0   Follow-up: Return in about 13 weeks (around 03/22/2021), or if symptoms worsen or fail to improve.    [place] Jerold Coombe, NP

## 2020-12-21 NOTE — Therapy (Signed)
[facility] [address] [place], [place], [address] Phone: [phone]   Fax:  [phone]  Physical Therapy Treatment  Patient Details  Name: Amber Livingston MRN: [id number] Date of Birth: May 23, 1961 Referring Provider (PT): Dr. Gillis Santa   Encounter Date: 12/21/2020   PT End of Session - 12/21/20 1323    Visit Number 32    Number of Visits 41    Date for PT Re-Evaluation 12/28/20    PT Start Time 1316    PT Stop Time 1350    PT Time Calculation (min) 34 min    Activity Tolerance Patient tolerated treatment well    Behavior During Therapy [facility] for tasks assessed/performed           Past Medical History:  Diagnosis Date  . Arthritis   . Cervical spinal stenosis   . Emphysema, unspecified ([place])   . Fibromyalgia    Stage 7  . Lumbar stenosis   . Uterine fibroid     Past Surgical History:  Procedure Laterality Date  . ablation    . APPENDECTOMY    . DILATION AND CURETTAGE OF UTERUS     x3 for miscarriage  . LAPAROSCOPY     Fibroid removal  . PALPITATION    . TONSILLECTOMY      There were no vitals filed for this visit.   Subjective Assessment - 12/21/20 1320    Subjective Pt reports that her back has really been regressing. She reports 4/10 low back pain  No specific questions or concerns currently.    Pertinent History fibromyalgia    Patient Stated Goals return to work, improve strength and mobility, decrease pain    Currently in Pain? Yes    Pain Score 4     Pain Location Back    Pain Orientation Right;Left    Pain Descriptors / Indicators Aching    Pain Type Chronic pain    Pain Onset More than a month ago    Pain Frequency Intermittent               TREATMENT   Manual Therapy Moist heat to lumbar spinein prone during interval history; STM tolumbar region bilateral paraspinals, QL, and hips with percussive and vibratory technique using Theragun as well as with IASTM and fascial  gliding; Grade I-II CPA grade mobs L1-L5,30s/bout x2bouts/level; Grade I-II UPA grade mobs L1-L5 bilateral,30s/bout x2bouts/level; Supine FABER and FADIR stretches x 45s bilateral; Supine thoracolumbar rotation stretches x 45s bilateral;   Pt educated throughout session about proper posture and technique with exercises. Improved exercise technique, movement at target joints, use of target muscles after min to mod verbal, visual, tactile cues.   Ptreports that her back has been painful over the last few days so session today was directed toward pain relief. Session abbreviated today due to pain. She has an appointment with pain management this afternoon and states that she thinks she is ready to consider an antidepressant to help with her mood and pain.She has yet to achieve maximal benefit from physical therapy and pt will benefit fromadditionalPT services to address deficits in back pain and improve pain-free function at home.                             PT Long Term Goals - 12/14/20 1318      PT LONG TERM GOAL #1   Title Pt. will perform 5xSTS in 12 seconds or  less to demonstrate improvements in LE strength and functional mobility.    Baseline 11/16: 19.4s; 08/03/20: 11.5s    Time 8    Period Weeks    Status Achieved      PT LONG TERM GOAL #2   Title Pt. will report ability to vaccuum for greater than 10 mins with no increase in back pain over 2/10 to improve pain free functional mobility.    Baseline 11/16: pt is currently not able to vaccuum for 4-5 minutes with back pain at 5/10; 08/03/20: Pt can vaccuum for 4-5 minutes, no pain but fatigue, rest for a minute and continue. 2/15 pt had to stop 3 times while vacuuming and rest for a minute, reported discomfort with activity. Biggest complaint of numbness; 12/14/20: Pt reports that her DOE is more limiting than her back pain at this time with respect to vacuuming    Time 8    Period Weeks    Status  Partially Met    Target Date 12/28/20      PT LONG TERM GOAL #3   Title Pt. will improve FOTO to 50 to demonstrate improvement in pain free functional mobility.    Baseline 11/16: 35; 08/03/20: 63;    Time 4    Period Weeks    Status Achieved      PT LONG TERM GOAL #4   Title Pt. independent with HEP to increase B hip strength 1/2 muscle grade to improve standing tolerance/ pain-free household tasks.    Baseline Pt. demonstrates 5/5 strength bilaterally, except for bilat hip IR, ER, and flexion which scored 4/5 for bilat for each. 08/03/20: R/L Hip flexion: 4+/4+, hip extension: 4/4, hip abduction: 4+/4+, hip adduction: 4+/4; deferred to next session; 12/14/20: Hip strength: R/L hip flexion: 4+/4+, hip extension: 4/4, hip abduction: 4+/4+, hip adduction: 4+/4+    Time 8    Period Weeks    Status Achieved      PT LONG TERM GOAL #5   Title Patient will report and demonstrate the ability to perform finalized HEP independently, as well as an understanding on how to progress program to continue to maximize strength and function.    Time 8    Period Weeks    Status On-going    Target Date 12/28/20                 Plan - 12/21/20 1323    Clinical Impression Statement Pt reports that her back has been painful over the last few days so session today was directed toward pain relief. Session abbreviated today due to pain. She has an appointment with pain management this afternoon and states that she thinks she is ready to consider an antidepressant to help with her mood and pain. She has yet to achieve maximal benefit from physical therapy and pt will benefit from additional PT services to address deficits in back pain and improve pain-free function at home.    Personal Factors and Comorbidities Fitness;Past/Current Experience;Time since onset of injury/illness/exacerbation    Examination-Activity Limitations Bed Mobility;Lift;Stairs;Squat;Stand;Transfers;Sit    Examination-Participation  Restrictions Community Activity;Yard Work    Stability/Clinical Decision Making Stable/Uncomplicated    Rehab Potential Fair    PT Frequency 2x / week    PT Duration 8 weeks    PT Treatment/Interventions ADLs/Self Care Home Management;Aquatic Therapy;Cryotherapy;Gait training;Stair training;Moist Heat;Functional mobility training;Therapeutic activities;Therapeutic exercise;Balance training;Neuromuscular re-education;Patient/family education;Manual techniques;Dry needling;Electrical Stimulation;Spinal Manipulations;Joint Manipulations    PT Next Visit Plan prone manual tx, adominal and low back strengthening;  PT Home Exercise Plan Medbridge Access Code: [email or web address]    Consulted and Agree with Plan of Care Patient           Patient will benefit from skilled therapeutic intervention in order to improve the following deficits and impairments:  Abnormal gait,Decreased activity tolerance,Decreased endurance,Decreased mobility,Decreased range of motion,Hypomobility,Difficulty walking,Decreased strength,Increased muscle spasms,Impaired perceived functional ability,Pain  Visit Diagnosis: Chronic bilateral low back pain, unspecified whether sciatica present  Decreased strength     Problem List Patient Active Problem List   Diagnosis Date Noted  . Skin mole 12/21/2020  . History of depression 12/21/2020  . SVT (supraventricular tachycardia) ([place]) 11/07/2020  . Counseling on health promotion and disease prevention 10/24/2020  . Palpitations 08/25/2020  . DOE (dyspnea on exertion) 08/25/2020  . Mixed hyperlipidemia 08/25/2020  . History of palpitations 07/05/2020  . Shortness of breath 07/05/2020  . Chronic bilateral low back pain without sciatica 06/06/2020  . Lumbar facet arthropathy 06/06/2020  . Myofascial pain 06/06/2020  . Chronic pain syndrome 06/06/2020  . Cannabis use disorder, mild, abuse 06/06/2020  . Fibromyalgia 06/06/2020  . Elevated lipids 03/09/2020  . Encounter to  establish care 02/22/2020  . Chronic abdominal pain 02/22/2020  . Hot flashes 02/22/2020  . Chronic back pain 02/22/2020  . Anxiety 02/22/2020   Phillips Grout PT, DPT, GCS  Girtie Wiersma 12/21/2020, 4:20 PM  [facility] [address]. [place], [place], [address] Phone: [phone]   Fax:  [phone]  Name: Amber Livingston MRN: [id number] Date of Birth: 1961/02/04

## 2020-12-26 ENCOUNTER — Ambulatory Visit: Payer: Medicaid Other

## 2020-12-26 ENCOUNTER — Other Ambulatory Visit: Payer: Self-pay

## 2020-12-26 DIAGNOSIS — M545 Low back pain, unspecified: Secondary | ICD-10-CM

## 2020-12-26 DIAGNOSIS — G8929 Other chronic pain: Secondary | ICD-10-CM

## 2020-12-26 DIAGNOSIS — R531 Weakness: Secondary | ICD-10-CM

## 2020-12-26 NOTE — Therapy (Signed)
[facility] [address] [place], [place], [address] Phone: [phone]   Fax:  [phone]  Physical Therapy Treatment  Patient Details  Name: Amber Livingston MRN: [id number] Date of Birth: 03/25/1961 Referring Provider (PT): Dr. Gillis Santa   Encounter Date: 12/26/2020    Past Medical History:  Diagnosis Date  . Arthritis   . Cervical spinal stenosis   . Emphysema, unspecified ([place])   . Fibromyalgia    Stage 7  . Lumbar stenosis   . Uterine fibroid     Past Surgical History:  Procedure Laterality Date  . ablation    . APPENDECTOMY    . DILATION AND CURETTAGE OF UTERUS     x3 for miscarriage  . LAPAROSCOPY     Fibroid removal  . PALPITATION    . TONSILLECTOMY      There were no vitals filed for this visit.        TREATMENT   Manual Therapy Moist heat to lumbar spinein prone during interval history; STM tolumbar region bilateral paraspinals, QL, and hips with percussive and vibratory technique using Theragun; Grade I-II CPA grade mobs L1-L5,30s/bout x2bouts/level; Grade I-II UPA grade mobs L1-L5 bilateral,30s/bout x2bouts/level; Supine FABER and FADIR stretches x 45s bilateral; Supine thoracolumbar rotation stretches x 45s bilateral;   Ther-ex Supine alternating bicycle kicks with transverse abdominis contraction x 15 BLE; Hooklying ant/posterior pelvic tilt 5s hold x 10 each direction; Supine dead bugs x 10 on each side; Hooklying bridges 2 x 10; Hooklying clams with manual resistance 2 x 10; Hooklying adductor squeeze with manual resistance 2 x 10; Sidelying reverse clams with manual resistance 2 x 10; Sidelying straight leg hip abduction x 10 BLE; Sidelying straight leg hip circles CW/CCW x 10 each direction BLE;    Pt educated throughout session about proper posture and technique with exercises. Improved exercise technique, movement at target joints, use of target muscles after min to  mod verbal, visual, tactile cues.   Ptreports thather back has been painful over the last few days so session today was directed toward pain relief. Session abbreviated today due to pain. She has an appointment with pain management this afternoon and states that she thinks she is ready to consider an antidepressant to help with her mood and pain.She has yet to achieve maximal benefit from physical therapy and pt will benefit fromadditionalPT services to address deficits in back pain and improve pain-free function at home.                             PT Long Term Goals - 12/14/20 1318      PT LONG TERM GOAL #1   Title Pt. will perform 5xSTS in 12 seconds or less to demonstrate improvements in LE strength and functional mobility.    Baseline 11/16: 19.4s; 08/03/20: 11.5s    Time 8    Period Weeks    Status Achieved      PT LONG TERM GOAL #2   Title Pt. will report ability to vaccuum for greater than 10 mins with no increase in back pain over 2/10 to improve pain free functional mobility.    Baseline 11/16: pt is currently not able to vaccuum for 4-5 minutes with back pain at 5/10; 08/03/20: Pt can vaccuum for 4-5 minutes, no pain but fatigue, rest for a minute and continue. 2/15 pt had to stop 3 times while vacuuming and rest for a minute, reported  discomfort with activity. Biggest complaint of numbness; 12/14/20: Pt reports that her DOE is more limiting than her back pain at this time with respect to vacuuming    Time 8    Period Weeks    Status Partially Met    Target Date 12/28/20      PT LONG TERM GOAL #3   Title Pt. will improve FOTO to 50 to demonstrate improvement in pain free functional mobility.    Baseline 11/16: 35; 08/03/20: 63;    Time 4    Period Weeks    Status Achieved      PT LONG TERM GOAL #4   Title Pt. independent with HEP to increase B hip strength 1/2 muscle grade to improve standing tolerance/ pain-free household tasks.    Baseline  Pt. demonstrates 5/5 strength bilaterally, except for bilat hip IR, ER, and flexion which scored 4/5 for bilat for each. 08/03/20: R/L Hip flexion: 4+/4+, hip extension: 4/4, hip abduction: 4+/4+, hip adduction: 4+/4; deferred to next session; 12/14/20: Hip strength: R/L hip flexion: 4+/4+, hip extension: 4/4, hip abduction: 4+/4+, hip adduction: 4+/4+    Time 8    Period Weeks    Status Achieved      PT LONG TERM GOAL #5   Title Patient will report and demonstrate the ability to perform finalized HEP independently, as well as an understanding on how to progress program to continue to maximize strength and function.    Time 8    Period Weeks    Status On-going    Target Date 12/28/20                  Patient will benefit from skilled therapeutic intervention in order to improve the following deficits and impairments:     Visit Diagnosis: Chronic bilateral low back pain, unspecified whether sciatica present  Decreased strength     Problem List Patient Active Problem List   Diagnosis Date Noted  . Skin mole 12/21/2020  . History of depression 12/21/2020  . SVT (supraventricular tachycardia) ([place]) 11/07/2020  . Counseling on health promotion and disease prevention 10/24/2020  . Palpitations 08/25/2020  . DOE (dyspnea on exertion) 08/25/2020  . Mixed hyperlipidemia 08/25/2020  . History of palpitations 07/05/2020  . Shortness of breath 07/05/2020  . Chronic bilateral low back pain without sciatica 06/06/2020  . Lumbar facet arthropathy 06/06/2020  . Myofascial pain 06/06/2020  . Chronic pain syndrome 06/06/2020  . Cannabis use disorder, mild, abuse 06/06/2020  . Fibromyalgia 06/06/2020  . Elevated lipids 03/09/2020  . Encounter to establish care 02/22/2020  . Chronic abdominal pain 02/22/2020  . Hot flashes 02/22/2020  . Chronic back pain 02/22/2020  . Anxiety 02/22/2020   Phillips Grout PT, DPT, GCS  , 12/26/2020, 1:14 PM  [facility] [address] [place], [place], [address] Phone: [phone]   Fax:  [phone]  Name: Amber Livingston MRN: [id number] Date of Birth: 12-30-1960

## 2020-12-27 NOTE — Patient Instructions (Signed)
TREATMENT   Manual Therapy Moist heat to lumbar spinein prone during interval history; STM tolumbar region bilateral paraspinals, QL, and hips with percussive and vibratory technique using Theragun; Grade I-II CPA grade mobs L1-L5,30s/bout x2bouts/level; Grade I-II UPA grade mobs L1-L5 bilateral,30s/bout x2bouts/level; Supine FABER and FADIR stretches x 45s bilateral; Supine thoracolumbar rotation stretches x 45s bilateral;   Ther-ex Supine alternating bicycle kicks withtransverse abdominis contractionx 15 BLE; Hooklying ant/posterior pelvic tilt 5s hold x 10 each direction; Supine dead bugs x 10 on each side; Hooklying bridges 2 x 10; Hooklying clams with manual resistance 2 x 10; Hooklying adductor squeeze with manual resistance 2 x 10; Sidelying reverse clams with manual resistance 2 x 10; Sidelying straight leg hip abduction x 10 BLE; Sidelying straight leg hip circles CW/CCW x 10 each direction BLE;    Pt educated throughout session about proper posture and technique with exercises. Improved exercise technique, movement at target joints, use of target muscles after min to mod verbal, visual, tactile cues.   Ptreports thather back has beenpainful over the last few days so session today was directed toward pain relief. Session abbreviated today due to pain. She has an appointment with pain management this afternoon and states that she thinks she is ready to consider an antidepressant to help with her mood and pain.She has yet to achieve maximal benefit from physical therapy and pt will benefit fromadditionalPT services to address deficits in back pain and improve pain-free function at home.

## 2020-12-28 ENCOUNTER — Ambulatory Visit: Payer: Medicaid Other

## 2020-12-28 ENCOUNTER — Other Ambulatory Visit: Payer: Self-pay

## 2020-12-28 DIAGNOSIS — M545 Low back pain, unspecified: Secondary | ICD-10-CM | POA: Diagnosis not present

## 2020-12-28 DIAGNOSIS — R531 Weakness: Secondary | ICD-10-CM

## 2020-12-28 DIAGNOSIS — G8929 Other chronic pain: Secondary | ICD-10-CM

## 2020-12-29 NOTE — Therapy (Signed)
[facility] [address] [place], [place], [address] Phone: [phone]   Fax:  [phone]  Physical Therapy Treatment  Patient Details  Name: Amber Livingston MRN: [id number] Date of Birth: [birth year] Referring Provider (PT): Dr. Gillis Santa   Encounter Date: 12/28/2020   PT End of Session - 12/29/20 1504    Visit Number 34    Number of Visits 57    Date for PT Re-Evaluation 02/22/21    PT Start Time 1315    PT Stop Time 1355    PT Time Calculation (min) 40 min    Activity Tolerance Patient tolerated treatment well    Behavior During Therapy [facility] for tasks assessed/performed           Past Medical History:  Diagnosis Date  . Arthritis   . Cervical spinal stenosis   . Emphysema, unspecified ([place])   . Fibromyalgia    Stage 7  . Lumbar stenosis   . Uterine fibroid     Past Surgical History:  Procedure Laterality Date  . ablation    . APPENDECTOMY    . DILATION AND CURETTAGE OF UTERUS     x3 for miscarriage  . LAPAROSCOPY     Fibroid removal  . PALPITATION    . TONSILLECTOMY      There were no vitals filed for this visit.   Subjective Assessment - 12/28/20 1321    Subjective Pt reports that her back was really painful this morning but after moving it helped. She does not rate her low back pain upon arrival. No specific questions or concerns currently.    Pertinent History fibromyalgia    Patient Stated Goals return to work, improve strength and mobility, decrease pain    Currently in Pain? Yes    Pain Score --   Does not rate   Pain Location Back    Pain Orientation Right;Left    Pain Descriptors / Indicators Aching    Pain Type Chronic pain    Pain Onset More than a month ago                TREATMENT   Manual Therapy Moist heat to lumbar spinein prone during interval history; STM tolumbar region bilateral paraspinals, QL, and hips with percussive and vibratory technique using Theragun; Grade  I-II CPA grade mobs L1-L5,30s/bout x2bouts/level; Grade I-II UPA grade mobs L1-L5 bilateral,30s/bout x2bouts/level; Supine FABER and FADIR stretches x 45s bilateral; Supine hip flexor stretches x 45s bilateral; Supine thoracolumbar rotation stretches x 45s bilateral;   Ther-ex Supine alternating bicycle kicks withtransverse abdominis contractionx 15 BLE; Hooklying ant/posterior pelvic tilt 5s hold x 10 each direction; Supine dead bugs x 10 on each side; Hooklying bridges 2 x 10; Hooklying clams with manual resistance 2 x 10; Hooklying adductor squeeze with manual resistance 2 x 10;    Pt educated throughout session about proper posture and technique with exercises. Improved exercise technique, movement at target joints, use of target muscles after min to mod verbal, visual, tactile cues.   Continued with manual techniques for her low back and strengthening for long term relief/pain management. Outcome measures last updated 12/14/20 so not repeated today. Her hip/back pain has improved since starting with therapy however she has significant fluctuation in her symptoms. Her hip strength has also improved since starting with therapy. She is able to vacuum around the house however her biggest limitation now is related to her DOE and not her back pain specifically.She has yet to  achieve maximal benefit from physical therapy and pt will benefit fromadditionalPT services to address deficits in back pain and improve pain-free function at home.                            PT Long Term Goals - 12/29/20 1505      PT LONG TERM GOAL #1   Title Pt. will perform 5xSTS in 12 seconds or less to demonstrate improvements in LE strength and functional mobility.    Baseline 11/16: 19.4s; 08/03/20: 11.5s    Time 8    Period Weeks    Status Achieved      PT LONG TERM GOAL #2   Title Pt. will report ability to vaccuum for greater than 10 mins with no increase in  back pain over 2/10 to improve pain free functional mobility.    Baseline 11/16: pt is currently not able to vaccuum for 4-5 minutes with back pain at 5/10; 08/03/20: Pt can vaccuum for 4-5 minutes, no pain but fatigue, rest for a minute and continue. 2/15 pt had to stop 3 times while vacuuming and rest for a minute, reported discomfort with activity. Biggest complaint of numbness; 12/14/20: Pt reports that her DOE is more limiting than her back pain at this time with respect to vacuuming    Time 8    Period Weeks    Status Partially Met    Target Date 02/22/21      PT LONG TERM GOAL #3   Title Pt. will improve FOTO to 50 to demonstrate improvement in pain free functional mobility.    Baseline 11/16: 35; 08/03/20: 63;    Time 4    Period Weeks    Status Achieved      PT LONG TERM GOAL #4   Title Pt. independent with HEP to increase B hip strength 1/2 muscle grade to improve standing tolerance/ pain-free household tasks.    Baseline Pt. demonstrates 5/5 strength bilaterally, except for bilat hip IR, ER, and flexion which scored 4/5 for bilat for each. 08/03/20: R/L Hip flexion: 4+/4+, hip extension: 4/4, hip abduction: 4+/4+, hip adduction: 4+/4; deferred to next session; 12/14/20: Hip strength: R/L hip flexion: 4+/4+, hip extension: 4/4, hip abduction: 4+/4+, hip adduction: 4+/4+    Time 8    Period Weeks    Status Achieved      PT LONG TERM GOAL #5   Title Patient will report and demonstrate the ability to perform finalized HEP independently, as well as an understanding on how to progress program to continue to maximize strength and function.    Time 8    Period Weeks    Status On-going    Target Date 02/22/21                 Plan - 12/29/20 1505    Clinical Impression Statement Continued with manual techniques for her low back and strengthening for long term relief/pain management. Outcome measures last updated 12/14/20 so not repeated today. Her hip/back pain has improved since  starting with therapy however she has significant fluctuation in her symptoms. Her hip strength has also improved since starting with therapy. She is able to vacuum around the house however her biggest limitation now is related to her DOE and not her back pain specifically. She has yet to achieve maximal benefit from physical therapy and pt will benefit from additional PT services to address deficits in back pain and improve pain-free function  at home.    Personal Factors and Comorbidities Fitness;Past/Current Experience;Time since onset of injury/illness/exacerbation    Examination-Activity Limitations Bed Mobility;Lift;Stairs;Squat;Stand;Transfers;Sit    Examination-Participation Restrictions Community Activity;Yard Work    Stability/Clinical Decision Making Stable/Uncomplicated    Rehab Potential Fair    PT Frequency 2x / week    PT Duration 8 weeks    PT Treatment/Interventions ADLs/Self Care Home Management;Aquatic Therapy;Cryotherapy;Gait training;Stair training;Moist Heat;Functional mobility training;Therapeutic activities;Therapeutic exercise;Balance training;Neuromuscular re-education;Patient/family education;Manual techniques;Dry needling;Electrical Stimulation;Spinal Manipulations;Joint Manipulations    PT Next Visit Plan prone manual tx, adominal and low back strengthening;    PT Home Exercise Plan Medbridge Access Code: [email or web address]    Consulted and Agree with Plan of Care Patient           Patient will benefit from skilled therapeutic intervention in order to improve the following deficits and impairments:  Abnormal gait,Decreased activity tolerance,Decreased endurance,Decreased mobility,Decreased range of motion,Hypomobility,Difficulty walking,Decreased strength,Increased muscle spasms,Impaired perceived functional ability,Pain  Visit Diagnosis: Chronic bilateral low back pain, unspecified whether sciatica present  Decreased strength     Problem List Patient Active Problem List    Diagnosis Date Noted  . Skin mole 12/21/2020  . History of depression 12/21/2020  . SVT (supraventricular tachycardia) ([place]) 11/07/2020  . Counseling on health promotion and disease prevention 10/24/2020  . Palpitations 08/25/2020  . DOE (dyspnea on exertion) 08/25/2020  . Mixed hyperlipidemia 08/25/2020  . History of palpitations 07/05/2020  . Shortness of breath 07/05/2020  . Chronic bilateral low back pain without sciatica 06/06/2020  . Lumbar facet arthropathy 06/06/2020  . Myofascial pain 06/06/2020  . Chronic pain syndrome 06/06/2020  . Cannabis use disorder, mild, abuse 06/06/2020  . Fibromyalgia 06/06/2020  . Elevated lipids 03/09/2020  . Encounter to establish care 02/22/2020  . Chronic abdominal pain 02/22/2020  . Hot flashes 02/22/2020  . Chronic back pain 02/22/2020  . Anxiety 02/22/2020   Phillips Grout PT, DPT, GCS  Vitali Seibert 12/29/2020, 3:10 PM  [facility] [address] [place], [place], [address] Phone: [phone]   Fax:  [phone]  Name: Amber Livingston MRN: [id number] Date of Birth: 1960-10-06

## 2020-12-30 ENCOUNTER — Encounter: Payer: Self-pay | Admitting: Pulmonary Disease

## 2020-12-30 DIAGNOSIS — R911 Solitary pulmonary nodule: Secondary | ICD-10-CM | POA: Insufficient documentation

## 2020-12-30 DIAGNOSIS — J432 Centrilobular emphysema: Secondary | ICD-10-CM | POA: Insufficient documentation

## 2021-01-02 ENCOUNTER — Other Ambulatory Visit: Payer: Self-pay

## 2021-01-02 ENCOUNTER — Ambulatory Visit: Payer: Medicaid Other

## 2021-01-02 DIAGNOSIS — G8929 Other chronic pain: Secondary | ICD-10-CM

## 2021-01-02 DIAGNOSIS — M545 Low back pain, unspecified: Secondary | ICD-10-CM | POA: Diagnosis not present

## 2021-01-02 DIAGNOSIS — R531 Weakness: Secondary | ICD-10-CM

## 2021-01-02 NOTE — Therapy (Signed)
[facility] [address] [place], [place], [address] Phone: [phone]   Fax:  [phone]  Physical Therapy Treatment  Patient Details  Name: Amber Livingston MRN: [id number] Date of Birth: 1961-06-20 Referring Provider (PT): Dr. Gillis Santa   Encounter Date: 01/02/2021   PT End of Session - 01/02/21 1314    Visit Number 35    Number of Visits 57    Date for PT Re-Evaluation 02/22/21    PT Start Time 1316    PT Stop Time 1400    PT Time Calculation (min) 44 min    Activity Tolerance Patient tolerated treatment well    Behavior During Therapy [facility] for tasks assessed/performed           Past Medical History:  Diagnosis Date  . Arthritis   . Cervical spinal stenosis   . Emphysema, unspecified ([place])   . Fibromyalgia    Stage 7  . Lumbar stenosis   . Uterine fibroid     Past Surgical History:  Procedure Laterality Date  . ablation    . APPENDECTOMY    . DILATION AND CURETTAGE OF UTERUS     x3 for miscarriage  . LAPAROSCOPY     Fibroid removal  . PALPITATION    . TONSILLECTOMY      There were no vitals filed for this visit.   Subjective Assessment - 01/02/21 1313    Subjective Pt reports 2/10 low back pain upon arrival today. No changes since last therapy session. No specific questions or concerns currently.    Pertinent History fibromyalgia    Patient Stated Goals return to work, improve strength and mobility, decrease pain    Currently in Pain? Yes    Pain Score 2     Pain Location Back    Pain Orientation Right;Left    Pain Descriptors / Indicators Aching    Pain Type Chronic pain    Pain Onset More than a month ago    Pain Frequency Intermittent               TREATMENT   Manual Therapy Moist heat to lumbar spinein prone during interval history; STM tolumbar region bilateral paraspinals, QL, and hips with percussive and vibratory technique using Theragun; Grade I-II CPA grade mobs  L1-L5,30s/bout x2bouts/level; Grade I-II UPA grade mobs L1-L5 bilateral,30s/bout x2bouts/level; Supine FABER and FADIR stretches x 45s bilateral; Supine hip flexor stretches x 45s bilateral; Supine thoracolumbar rotation stretches x 45s bilateral;   Ther-ex Hooklying SLR 2 x 10 BLE; Supine alternating bicycle kicks withtransverse abdominis contractionx 15 BLE; Hooklying bridges 2 x 20; Supine dead bugs x 10 on each side; Hooklying clams with manual resistance x 20; Hooklying adductor squeeze with manual resistance x 20;    Pt educated throughout session about proper posture and technique with exercises. Improved exercise technique, movement at target joints, use of target muscles after min to mod verbal, visual, tactile cues.   Session focused on combination of strengthening and manual techniques to decrease low back pain and improve tissue extensibility.  Patient is able to complete all exercises as instructed without increase in her pain.  She continues demonstrate improved core control with hook lying exercises and dead bugs.  Encouraged patient to continue her HEP and follow-up as scheduled.  She has yet to achieve maximal benefit from physical therapy and pt will benefit fromadditionalPT services to address deficits in back pain and improve pain-free function at home.  PT Long Term Goals - 12/29/20 1505      PT LONG TERM GOAL #1   Title Pt. will perform 5xSTS in 12 seconds or less to demonstrate improvements in LE strength and functional mobility.    Baseline 11/16: 19.4s; 08/03/20: 11.5s    Time 8    Period Weeks    Status Achieved      PT LONG TERM GOAL #2   Title Pt. will report ability to vaccuum for greater than 10 mins with no increase in back pain over 2/10 to improve pain free functional mobility.    Baseline 11/16: pt is currently not able to vaccuum for 4-5 minutes with back  pain at 5/10; 08/03/20: Pt can vaccuum for 4-5 minutes, no pain but fatigue, rest for a minute and continue. 2/15 pt had to stop 3 times while vacuuming and rest for a minute, reported discomfort with activity. Biggest complaint of numbness; 12/14/20: Pt reports that her DOE is more limiting than her back pain at this time with respect to vacuuming    Time 8    Period Weeks    Status Partially Met    Target Date 02/22/21      PT LONG TERM GOAL #3   Title Pt. will improve FOTO to 50 to demonstrate improvement in pain free functional mobility.    Baseline 11/16: 35; 08/03/20: 63;    Time 4    Period Weeks    Status Achieved      PT LONG TERM GOAL #4   Title Pt. independent with HEP to increase B hip strength 1/2 muscle grade to improve standing tolerance/ pain-free household tasks.    Baseline Pt. demonstrates 5/5 strength bilaterally, except for bilat hip IR, ER, and flexion which scored 4/5 for bilat for each. 08/03/20: R/L Hip flexion: 4+/4+, hip extension: 4/4, hip abduction: 4+/4+, hip adduction: 4+/4; deferred to next session; 12/14/20: Hip strength: R/L hip flexion: 4+/4+, hip extension: 4/4, hip abduction: 4+/4+, hip adduction: 4+/4+    Time 8    Period Weeks    Status Achieved      PT LONG TERM GOAL #5   Title Patient will report and demonstrate the ability to perform finalized HEP independently, as well as an understanding on how to progress program to continue to maximize strength and function.    Time 8    Period Weeks    Status On-going    Target Date 02/22/21                 Plan - 01/02/21 1314    Clinical Impression Statement Session focused on combination of strengthening and manual techniques to decrease low back pain and improve tissue extensibility.  Patient is able to complete all exercises as instructed without increase in her pain.  She continues demonstrate improved core control with hook lying exercises and dead bugs.  Encouraged patient to continue her HEP  and follow-up as scheduled.  She has yet to achieve maximal benefit from physical therapy and pt will benefit from additional PT services to address deficits in back pain and improve pain-free function at home.    Personal Factors and Comorbidities Fitness;Past/Current Experience;Time since onset of injury/illness/exacerbation    Examination-Activity Limitations Bed Mobility;Lift;Stairs;Squat;Stand;Transfers;Sit    Examination-Participation Restrictions Community Activity;Yard Work    Stability/Clinical Decision Making Stable/Uncomplicated    Rehab Potential Fair    PT Frequency 2x / week    PT Duration 8 weeks    PT Treatment/Interventions ADLs/Self Care Home Management;Aquatic  Therapy;Cryotherapy;Gait training;Stair training;Moist Heat;Functional mobility training;Therapeutic activities;Therapeutic exercise;Balance training;Neuromuscular re-education;Patient/family education;Manual techniques;Dry needling;Electrical Stimulation;Spinal Manipulations;Joint Manipulations    PT Next Visit Plan prone manual tx, adominal and low back strengthening;    PT Home Exercise Plan Medbridge Access Code: [email or web address]    Consulted and Agree with Plan of Care Patient           Patient will benefit from skilled therapeutic intervention in order to improve the following deficits and impairments:  Abnormal gait,Decreased activity tolerance,Decreased endurance,Decreased mobility,Decreased range of motion,Hypomobility,Difficulty walking,Decreased strength,Increased muscle spasms,Impaired perceived functional ability,Pain  Visit Diagnosis: Chronic bilateral low back pain, unspecified whether sciatica present  Decreased strength     Problem List Patient Active Problem List   Diagnosis Date Noted  . Centrilobular emphysema ([place]) 12/30/2020  . Lung nodule 12/30/2020  . Skin mole 12/21/2020  . History of depression 12/21/2020  . SVT (supraventricular tachycardia) ([place]) 11/07/2020  . Counseling on health  promotion and disease prevention 10/24/2020  . Palpitations 08/25/2020  . DOE (dyspnea on exertion) 08/25/2020  . Mixed hyperlipidemia 08/25/2020  . History of palpitations 07/05/2020  . Shortness of breath 07/05/2020  . Chronic bilateral low back pain without sciatica 06/06/2020  . Lumbar facet arthropathy 06/06/2020  . Myofascial pain 06/06/2020  . Chronic pain syndrome 06/06/2020  . Cannabis use disorder, mild, abuse 06/06/2020  . Fibromyalgia 06/06/2020  . Elevated lipids 03/09/2020  . Encounter to establish care 02/22/2020  . Chronic abdominal pain 02/22/2020  . Hot flashes 02/22/2020  . Chronic back pain 02/22/2020  . Anxiety 02/22/2020   Phillips Grout PT, DPT, GCS  Jamorris Ndiaye 01/03/2021, 10:31 AM  [place] [facility] [address] [place], [place], [address] Phone: [phone]   Fax:  [phone]  Name: Maddi Collar MRN: [id number] Date of Birth: 04-15-1961

## 2021-01-03 NOTE — Patient Instructions (Addendum)
TREATMENT   Manual Therapy Moist heat to lumbar spinein prone during interval history; STM tolumbar region bilateral paraspinals, QL, and hips with percussive and vibratory technique using Theragun; Grade I-II CPA grade mobs L1-L5,30s/bout x2bouts/level; Grade I-II UPA grade mobs L1-L5 bilateral,30s/bout x2bouts/level; Supine FABER and FADIR stretches x 45s bilateral; Supine hip flexor stretches x 45s bilateral; Supine thoracolumbar rotation stretches x 45s bilateral;   Ther-ex Hooklying SLR 2 x 10 BLE; Supine alternating bicycle kicks withtransverse abdominis contractionx 15 BLE; Hooklying bridges 2 x 20; Supine dead bugs x 10 on each side; Hooklying clams with manual resistance x 20; Hooklying adductor squeeze with manual resistance x 20;   Pt educated throughout session about proper posture and technique with exercises. Improved exercise technique, movement at target joints, use of target muscles after min to mod verbal, visual, tactile cues.   Session focused on combination of strengthening and manual techniques to decrease low back pain and improve tissue extensibility.  Patient is able to complete all exercises as instructed without increase in her pain.  She continues demonstrate improved core control with hook lying exercises and dead bugs.  Encouraged patient to continue her HEP and follow-up as scheduled.  She has yet to achieve maximal benefit from physical therapy and pt will benefit fromadditionalPT services to address deficits in back pain and improve pain-free function at home.

## 2021-01-04 ENCOUNTER — Ambulatory Visit: Payer: Medicaid Other

## 2021-01-04 ENCOUNTER — Other Ambulatory Visit: Payer: Self-pay

## 2021-01-04 DIAGNOSIS — G8929 Other chronic pain: Secondary | ICD-10-CM

## 2021-01-04 DIAGNOSIS — R531 Weakness: Secondary | ICD-10-CM

## 2021-01-04 DIAGNOSIS — M545 Low back pain, unspecified: Secondary | ICD-10-CM | POA: Diagnosis not present

## 2021-01-04 NOTE — Therapy (Signed)
[facility] [address] [place], [place], [address] Phone: [phone]   Fax:  [phone]  Physical Therapy Treatment  Patient Details  Name: Amber Livingston MRN: [id number] Date of Birth: 05-31-1961 Referring Provider (PT): Dr. Gillis Santa   Encounter Date: 01/04/2021   PT End of Session - 01/04/21 1318    Visit Number 36    Number of Visits 57    Date for PT Re-Evaluation 02/22/21    PT Start Time 1315    PT Stop Time 1400    PT Time Calculation (min) 45 min    Activity Tolerance Patient tolerated treatment well    Behavior During Therapy [facility] for tasks assessed/performed           Past Medical History:  Diagnosis Date  . Arthritis   . Cervical spinal stenosis   . Emphysema, unspecified ([place])   . Fibromyalgia    Stage 7  . Lumbar stenosis   . Uterine fibroid     Past Surgical History:  Procedure Laterality Date  . ablation    . APPENDECTOMY    . DILATION AND CURETTAGE OF UTERUS     x3 for miscarriage  . LAPAROSCOPY     Fibroid removal  . PALPITATION    . TONSILLECTOMY      There were no vitals filed for this visit.   Subjective Assessment - 01/04/21 1317    Subjective Pt reports 3/10 low back pain upon arrival today. She had a significant flare the afternoon after her last therapy session.  No specific questions or concerns currently.    Pertinent History fibromyalgia    Patient Stated Goals return to work, improve strength and mobility, decrease pain    Currently in Pain? Yes    Pain Score 3     Pain Location Back    Pain Orientation Right;Left    Pain Descriptors / Indicators Aching    Pain Type Chronic pain    Pain Onset More than a month ago    Pain Frequency Intermittent                TREATMENT   Manual Therapy Moist heat to lumbar spinein prone during interval history; STM tolumbar region bilateral paraspinals, QL, and hips with percussive and vibratory technique using  Theragun; Grade I-II CPA grade mobs L1-L5,30s/bout x2bouts/level; Grade I-II UPA grade mobs L1-L5 bilateral,30s/bout x2bouts/level; Supine FABER and FADIR stretches x 45s bilateral; Supine hip flexor stretches x 45s bilateral; Supine thoracolumbar rotation stretches x 45s bilateral;   Ther-ex Prone alternating hip extension 2 x 10; Prone HS curls 2 x 10; Child's pose stretch 30s x 2; Cobra pose stretch 30s x 2; Quadruped bird dogs x 10 on each side; Quadruped fire hydrants x 10 on each side;   Pt educated throughout session about proper posture and technique with exercises. Improved exercise technique, movement at target joints, use of target muscles after min to mod verbal, visual, tactile cues.   Session focused on combination of strengthening and manual techniques to decrease low back pain and improve tissue extensibility.  Patient is able to complete all exercises as instructed without increase in her pain. She reports significant relief from hip and low back stretches.  Encouraged patient to continue her HEP and follow-up as scheduled.  She has yet to achieve maximal benefit from physical therapy and pt will benefit fromadditionalPT services to address deficits in back pain and improve pain-free function at home.  PT Long Term Goals - 12/29/20 1505      PT LONG TERM GOAL #1   Title Pt. will perform 5xSTS in 12 seconds or less to demonstrate improvements in LE strength and functional mobility.    Baseline 11/16: 19.4s; 08/03/20: 11.5s    Time 8    Period Weeks    Status Achieved      PT LONG TERM GOAL #2   Title Pt. will report ability to vaccuum for greater than 10 mins with no increase in back pain over 2/10 to improve pain free functional mobility.    Baseline 11/16: pt is currently not able to vaccuum for 4-5 minutes with back pain at 5/10; 08/03/20: Pt can vaccuum for 4-5 minutes, no pain but fatigue,  rest for a minute and continue. 2/15 pt had to stop 3 times while vacuuming and rest for a minute, reported discomfort with activity. Biggest complaint of numbness; 12/14/20: Pt reports that her DOE is more limiting than her back pain at this time with respect to vacuuming    Time 8    Period Weeks    Status Partially Met    Target Date 02/22/21      PT LONG TERM GOAL #3   Title Pt. will improve FOTO to 50 to demonstrate improvement in pain free functional mobility.    Baseline 11/16: 35; 08/03/20: 63;    Time 4    Period Weeks    Status Achieved      PT LONG TERM GOAL #4   Title Pt. independent with HEP to increase B hip strength 1/2 muscle grade to improve standing tolerance/ pain-free household tasks.    Baseline Pt. demonstrates 5/5 strength bilaterally, except for bilat hip IR, ER, and flexion which scored 4/5 for bilat for each. 08/03/20: R/L Hip flexion: 4+/4+, hip extension: 4/4, hip abduction: 4+/4+, hip adduction: 4+/4; deferred to next session; 12/14/20: Hip strength: R/L hip flexion: 4+/4+, hip extension: 4/4, hip abduction: 4+/4+, hip adduction: 4+/4+    Time 8    Period Weeks    Status Achieved      PT LONG TERM GOAL #5   Title Patient will report and demonstrate the ability to perform finalized HEP independently, as well as an understanding on how to progress program to continue to maximize strength and function.    Time 8    Period Weeks    Status On-going    Target Date 02/22/21                 Plan - 01/04/21 1318    Clinical Impression Statement Session focused on combination of strengthening and manual techniques to decrease low back pain and improve tissue extensibility.  Patient is able to complete all exercises as instructed without increase in her pain. She reports significant relief from hip and low back stretches.  Encouraged patient to continue her HEP and follow-up as scheduled.  She has yet to achieve maximal benefit from physical therapy and pt will  benefit from additional PT services to address deficits in back pain and improve pain-free function at home.    Personal Factors and Comorbidities Fitness;Past/Current Experience;Time since onset of injury/illness/exacerbation    Examination-Activity Limitations Bed Mobility;Lift;Stairs;Squat;Stand;Transfers;Sit    Examination-Participation Restrictions Community Activity;Yard Work    Stability/Clinical Decision Making Stable/Uncomplicated    Rehab Potential Fair    PT Frequency 2x / week    PT Duration 8 weeks    PT Treatment/Interventions ADLs/Self Care Home Management;Aquatic Therapy;Cryotherapy;Gait training;Stair training;Moist Heat;Functional  mobility training;Therapeutic activities;Therapeutic exercise;Balance training;Neuromuscular re-education;Patient/family education;Manual techniques;Dry needling;Electrical Stimulation;Spinal Manipulations;Joint Manipulations    PT Next Visit Plan prone manual tx, adominal and low back strengthening;    PT Home Exercise Plan Medbridge Access Code: [email or web address]    Consulted and Agree with Plan of Care Patient           Patient will benefit from skilled therapeutic intervention in order to improve the following deficits and impairments:  Abnormal gait,Decreased activity tolerance,Decreased endurance,Decreased mobility,Decreased range of motion,Hypomobility,Difficulty walking,Decreased strength,Increased muscle spasms,Impaired perceived functional ability,Pain  Visit Diagnosis: Chronic bilateral low back pain, unspecified whether sciatica present  Decreased strength     Problem List Patient Active Problem List   Diagnosis Date Noted  . Centrilobular emphysema ([place]) 12/30/2020  . Lung nodule 12/30/2020  . Skin mole 12/21/2020  . History of depression 12/21/2020  . SVT (supraventricular tachycardia) ([place]) 11/07/2020  . Counseling on health promotion and disease prevention 10/24/2020  . Palpitations 08/25/2020  . DOE (dyspnea on exertion)  08/25/2020  . Mixed hyperlipidemia 08/25/2020  . History of palpitations 07/05/2020  . Shortness of breath 07/05/2020  . Chronic bilateral low back pain without sciatica 06/06/2020  . Lumbar facet arthropathy 06/06/2020  . Myofascial pain 06/06/2020  . Chronic pain syndrome 06/06/2020  . Cannabis use disorder, mild, abuse 06/06/2020  . Fibromyalgia 06/06/2020  . Elevated lipids 03/09/2020  . Encounter to establish care 02/22/2020  . Chronic abdominal pain 02/22/2020  . Hot flashes 02/22/2020  . Chronic back pain 02/22/2020  . Anxiety 02/22/2020   Phillips Grout PT, DPT, GCS  Amber Livingston 01/04/2021, 9:08 PM  [facility] [address] [place], [place], [address] Phone: [phone]   Fax:  [phone]  Name: Amber Livingston MRN: [id number] Date of Birth: 1961/08/06

## 2021-01-05 ENCOUNTER — Other Ambulatory Visit: Payer: Self-pay

## 2021-01-08 NOTE — Patient Instructions (Incomplete)
TREATMENT   Manual Therapy Moist heat to lumbar spinein prone during interval history; STM tolumbar region bilateral paraspinals, QL, and hips with percussive and vibratory technique using Theragun; Grade I-II CPA grade mobs L1-L5,30s/bout x2bouts/level; Grade I-II UPA grade mobs L1-L5 bilateral,30s/bout x2bouts/level; Supine FABER and FADIR stretches x 45s bilateral; Supine hip flexor stretches x 45s bilateral; Supine thoracolumbar rotation stretches x 45s bilateral;   Ther-ex Prone alternating hip extension 2 x 10; Prone HS curls 2 x 10; Child's pose stretch 30s x 2; Cobra pose stretch 30s x 2; Quadruped bird dogs x 10 on each side; Quadruped fire hydrants x 10 on each side;   Pt educated throughout session about proper posture and technique with exercises. Improved exercise technique, movement at target joints, use of target muscles after min to mod verbal, visual, tactile cues.   Session focused on combination of strengthening and manual techniques to decrease low back pain and improve tissue extensibility. Patient is able to complete all exercises as instructed without increase in her pain. She reports significant relief from hip and low back stretches. Encouragedpatient to continue her HEP and follow-up as scheduled. She has yet to achieve maximal benefit from physical therapy and pt will benefit fromadditionalPT services to address deficits in back pain and improve pain-free function at home.

## 2021-01-10 ENCOUNTER — Other Ambulatory Visit: Payer: Self-pay

## 2021-01-10 ENCOUNTER — Ambulatory Visit: Payer: Medicaid Other

## 2021-01-10 DIAGNOSIS — M545 Low back pain, unspecified: Secondary | ICD-10-CM

## 2021-01-10 DIAGNOSIS — G8929 Other chronic pain: Secondary | ICD-10-CM

## 2021-01-10 DIAGNOSIS — R531 Weakness: Secondary | ICD-10-CM

## 2021-01-10 NOTE — Therapy (Signed)
[place] [facility] [address] [place], [place], [address] Phone: [phone]   Fax:  [phone]  Physical Therapy Treatment  Patient Details  Name: Amber Livingston MRN: [id number] Date of Birth: 1961/05/11 Referring Provider (PT): Dr. Gillis Santa   Encounter Date: 01/10/2021   PT End of Session - 01/10/21 1211    Visit Number 37    Number of Visits 57    Date for PT Re-Evaluation 02/22/21    PT Start Time 1700    PT Stop Time 1225    PT Time Calculation (min) 40 min    Activity Tolerance Patient tolerated treatment well    Behavior During Therapy [facility] for tasks assessed/performed           Past Medical History:  Diagnosis Date  . Arthritis   . Cervical spinal stenosis   . Emphysema, unspecified ([place])   . Fibromyalgia    Stage 7  . Lumbar stenosis   . Uterine fibroid     Past Surgical History:  Procedure Laterality Date  . ablation    . APPENDECTOMY    . DILATION AND CURETTAGE OF UTERUS     x3 for miscarriage  . LAPAROSCOPY     Fibroid removal  . PALPITATION    . TONSILLECTOMY      There were no vitals filed for this visit.   Subjective Assessment - 01/10/21 1210    Subjective Pt reports 2/10 low back pain upon arrival today. She has had a rough week with respect to her pain.  No specific questions or concerns currently.    Pertinent History fibromyalgia    Patient Stated Goals return to work, improve strength and mobility, decrease pain    Currently in Pain? Yes    Pain Score 2     Pain Location Back    Pain Orientation Right;Left    Pain Descriptors / Indicators Aching    Pain Type Chronic pain    Pain Onset More than a month ago    Pain Frequency Intermittent                TREATMENT   Manual Therapy Moist heat to lumbar spinein prone during interval history; STM tolumbar region bilateral paraspinals, QL, and hips with percussive and vibratory technique using Theragun; Grade I-II CPA grade  mobs L1-L5,30s/bout x2bouts/level; Grade I-II UPA grade mobs L1-L5 bilateral,30s/bout x2bouts/level; Supine FABER and FADIR stretches x 45s bilateral; Supine thoracolumbar rotation stretches x 45s bilateral;   Ther-ex Prone alternating hip extension x 10 BLE; Child's pose stretch 30s x 2; Cobra pose stretch x 30s; Quadruped bird dogs 2 x 10 on each side; Quadruped fire hydrants 2 x 10 on each side; Quadruped reach under thoracic rotation stretch followed by open book x multiple bouts each side;   Pt educated throughout session about proper posture and technique with exercises. Improved exercise technique, movement at target joints, use of target muscles after min to mod verbal, visual, tactile cues.   Session focused on combination of strengthening and manual techniques to decrease low back pain and improve tissue extensibility.  Patient is able to complete all exercises as instructed without increase in her pain. She reports significant relief from hip and low back stretches. Encouraged patient to continue her HEP and follow-up as scheduled.  She has yet to achieve maximal benefit from physical therapy and pt will benefit fromadditionalPT services to address deficits in back pain and improve pain-free function at home.  PT Long Term Goals - 12/29/20 1505      PT LONG TERM GOAL #1   Title Pt. will perform 5xSTS in 12 seconds or less to demonstrate improvements in LE strength and functional mobility.    Baseline 11/16: 19.4s; 08/03/20: 11.5s    Time 8    Period Weeks    Status Achieved      PT LONG TERM GOAL #2   Title Pt. will report ability to vaccuum for greater than 10 mins with no increase in back pain over 2/10 to improve pain free functional mobility.    Baseline 11/16: pt is currently not able to vaccuum for 4-5 minutes with back pain at 5/10; 08/03/20: Pt can vaccuum for  4-5 minutes, no pain but fatigue, rest for a minute and continue. 2/15 pt had to stop 3 times while vacuuming and rest for a minute, reported discomfort with activity. Biggest complaint of numbness; 12/14/20: Pt reports that her DOE is more limiting than her back pain at this time with respect to vacuuming    Time 8    Period Weeks    Status Partially Met    Target Date 02/22/21      PT LONG TERM GOAL #3   Title Pt. will improve FOTO to 50 to demonstrate improvement in pain free functional mobility.    Baseline 11/16: 35; 08/03/20: 63;    Time 4    Period Weeks    Status Achieved      PT LONG TERM GOAL #4   Title Pt. independent with HEP to increase B hip strength 1/2 muscle grade to improve standing tolerance/ pain-free household tasks.    Baseline Pt. demonstrates 5/5 strength bilaterally, except for bilat hip IR, ER, and flexion which scored 4/5 for bilat for each. 08/03/20: R/L Hip flexion: 4+/4+, hip extension: 4/4, hip abduction: 4+/4+, hip adduction: 4+/4; deferred to next session; 12/14/20: Hip strength: R/L hip flexion: 4+/4+, hip extension: 4/4, hip abduction: 4+/4+, hip adduction: 4+/4+    Time 8    Period Weeks    Status Achieved      PT LONG TERM GOAL #5   Title Patient will report and demonstrate the ability to perform finalized HEP independently, as well as an understanding on how to progress program to continue to maximize strength and function.    Time 8    Period Weeks    Status On-going    Target Date 02/22/21                 Plan - 01/10/21 1211    Clinical Impression Statement Session focused on combination of strengthening and manual techniques to decrease low back pain and improve tissue extensibility.  Patient is able to complete all exercises as instructed without increase in her pain. She reports significant relief from hip and low back stretches. Encouraged patient to continue her HEP and follow-up as scheduled.  She has yet to achieve maximal benefit  from physical therapy and pt will benefit from additional PT services to address deficits in back pain and improve pain-free function at home.    Personal Factors and Comorbidities Fitness;Past/Current Experience;Time since onset of injury/illness/exacerbation    Examination-Activity Limitations Bed Mobility;Lift;Stairs;Squat;Stand;Transfers;Sit    Examination-Participation Restrictions Community Activity;Yard Work    Stability/Clinical Decision Making Stable/Uncomplicated    Rehab Potential Fair    PT Frequency 2x / week    PT Duration 8 weeks    PT Treatment/Interventions ADLs/Self Care Home Management;Aquatic Therapy;Cryotherapy;Gait training;Stair training;Moist Heat;Functional mobility  training;Therapeutic activities;Therapeutic exercise;Balance training;Neuromuscular re-education;Patient/family education;Manual techniques;Dry needling;Electrical Stimulation;Spinal Manipulations;Joint Manipulations    PT Next Visit Plan prone manual tx, adominal and low back strengthening;    PT Home Exercise Plan Medbridge Access Code: [email or web address]    Consulted and Agree with Plan of Care Patient           Patient will benefit from skilled therapeutic intervention in order to improve the following deficits and impairments:  Abnormal gait,Decreased activity tolerance,Decreased endurance,Decreased mobility,Decreased range of motion,Hypomobility,Difficulty walking,Decreased strength,Increased muscle spasms,Impaired perceived functional ability,Pain  Visit Diagnosis: Chronic bilateral low back pain, unspecified whether sciatica present  Decreased strength     Problem List Patient Active Problem List   Diagnosis Date Noted  . Centrilobular emphysema ([place]) 12/30/2020  . Lung nodule 12/30/2020  . Skin mole 12/21/2020  . History of depression 12/21/2020  . SVT (supraventricular tachycardia) ([place]) 11/07/2020  . Counseling on health promotion and disease prevention 10/24/2020  . Palpitations 08/25/2020   . DOE (dyspnea on exertion) 08/25/2020  . Mixed hyperlipidemia 08/25/2020  . History of palpitations 07/05/2020  . Shortness of breath 07/05/2020  . Chronic bilateral low back pain without sciatica 06/06/2020  . Lumbar facet arthropathy 06/06/2020  . Myofascial pain 06/06/2020  . Chronic pain syndrome 06/06/2020  . Cannabis use disorder, mild, abuse 06/06/2020  . Fibromyalgia 06/06/2020  . Elevated lipids 03/09/2020  . Encounter to establish care 02/22/2020  . Chronic abdominal pain 02/22/2020  . Hot flashes 02/22/2020  . Chronic back pain 02/22/2020  . Anxiety 02/22/2020   Phillips Grout PT, DPT, GCS  Ravleen Ries 01/10/2021, 1:59 PM  [place] [facility] [address] [place], [place], [address] Phone: [phone]   Fax:  [phone]  Name: Divine Imber MRN: [id number] Date of Birth: April 29, 1961

## 2021-01-11 ENCOUNTER — Ambulatory Visit: Payer: Medicaid Other

## 2021-01-16 ENCOUNTER — Other Ambulatory Visit: Payer: Self-pay

## 2021-01-16 ENCOUNTER — Ambulatory Visit: Payer: Medicaid Other

## 2021-01-16 DIAGNOSIS — M545 Low back pain, unspecified: Secondary | ICD-10-CM

## 2021-01-16 DIAGNOSIS — G8929 Other chronic pain: Secondary | ICD-10-CM

## 2021-01-16 DIAGNOSIS — R531 Weakness: Secondary | ICD-10-CM

## 2021-01-16 NOTE — Therapy (Signed)
[place] [facility] [address] [place], [place], [address] Phone: [phone]   Fax:  [phone]  Physical Therapy Treatment  Patient Details  Name: Amber Livingston MRN: [id number] Date of Birth: 04/30/1961 Referring Provider (PT): Dr. Gillis Santa   Encounter Date: 01/16/2021   PT End of Session - 01/16/21 1320    Visit Number 38    Number of Visits 57    Date for PT Re-Evaluation 02/22/21    PT Start Time 7062    PT Stop Time 1400    PT Time Calculation (min) 43 min    Activity Tolerance Patient tolerated treatment well    Behavior During Therapy [facility] for tasks assessed/performed           Past Medical History:  Diagnosis Date  . Arthritis   . Cervical spinal stenosis   . Emphysema, unspecified ([place])   . Fibromyalgia    Stage 7  . Lumbar stenosis   . Uterine fibroid     Past Surgical History:  Procedure Laterality Date  . ablation    . APPENDECTOMY    . DILATION AND CURETTAGE OF UTERUS     x3 for miscarriage  . LAPAROSCOPY     Fibroid removal  . PALPITATION    . TONSILLECTOMY      There were no vitals filed for this visit.   Subjective Assessment - 01/16/21 1319    Subjective Pt reports no low back pain upon arrival today. She has been doing some walking.  No specific questions or concerns currently.    Pertinent History fibromyalgia    Patient Stated Goals return to work, improve strength and mobility, decrease pain    Currently in Pain? No/denies               TREATMENT   Manual Therapy Moist heat to lumbar spinein prone during interval history; STM tolumbar region bilateral paraspinals, QL, and hips with percussive and vibratory technique using Theragun; Grade I-II CPA grade mobs L1-L5,30s/bout x2bouts/level; Grade I-II UPA grade mobs L1-L5 bilateral,30s/bout x2bouts/level; Supine FABER and FADIR stretches x 45s bilateral; Supine hip flexion stretches off edge of table x 45s  bilateral; Supine thoracolumbar rotation stretches x 45s bilateral;   Ther-ex Prone alternating hip extension x 20 BLE; Child's pose stretch 30s x 2; Cobra pose stretch 30s x 2; Quadruped cat cows x 10 each direction; Quadruped bird dogs 2 x 10 on each side; Quadruped fire hydrants 2 x 10 on each side; Supine pball bridges x 10; Supine pball rocking left and right x 10 each direction;   Pt educated throughout session about proper posture and technique with exercises. Improved exercise technique, movement at target joints, use of target muscles after min to mod verbal, visual, tactile cues.   Session focused on combination of strengthening and manual techniques to decrease low back pain and improve tissue extensibility. Patient is able to complete all exercises as instructed without increase in her pain. Added additional exercises today due to no reported pain upon arrival. She reports significant relief from hip and low back stretches. Encouragedpatient to continue her HEP and follow-up as scheduled. She has yet to achieve maximal benefit from physical therapy and pt will benefit fromadditionalPT services to address deficits in back pain and improve pain-free function at home.                             PT Long Term Goals -  12/29/20 1505      PT LONG TERM GOAL #1   Title Pt. will perform 5xSTS in 12 seconds or less to demonstrate improvements in LE strength and functional mobility.    Baseline 11/16: 19.4s; 08/03/20: 11.5s    Time 8    Period Weeks    Status Achieved      PT LONG TERM GOAL #2   Title Pt. will report ability to vaccuum for greater than 10 mins with no increase in back pain over 2/10 to improve pain free functional mobility.    Baseline 11/16: pt is currently not able to vaccuum for 4-5 minutes with back pain at 5/10; 08/03/20: Pt can vaccuum for 4-5 minutes, no pain but fatigue, rest for a minute and continue. 2/15 pt had to  stop 3 times while vacuuming and rest for a minute, reported discomfort with activity. Biggest complaint of numbness; 12/14/20: Pt reports that her DOE is more limiting than her back pain at this time with respect to vacuuming    Time 8    Period Weeks    Status Partially Met    Target Date 02/22/21      PT LONG TERM GOAL #3   Title Pt. will improve FOTO to 50 to demonstrate improvement in pain free functional mobility.    Baseline 11/16: 35; 08/03/20: 63;    Time 4    Period Weeks    Status Achieved      PT LONG TERM GOAL #4   Title Pt. independent with HEP to increase B hip strength 1/2 muscle grade to improve standing tolerance/ pain-free household tasks.    Baseline Pt. demonstrates 5/5 strength bilaterally, except for bilat hip IR, ER, and flexion which scored 4/5 for bilat for each. 08/03/20: R/L Hip flexion: 4+/4+, hip extension: 4/4, hip abduction: 4+/4+, hip adduction: 4+/4; deferred to next session; 12/14/20: Hip strength: R/L hip flexion: 4+/4+, hip extension: 4/4, hip abduction: 4+/4+, hip adduction: 4+/4+    Time 8    Period Weeks    Status Achieved      PT LONG TERM GOAL #5   Title Patient will report and demonstrate the ability to perform finalized HEP independently, as well as an understanding on how to progress program to continue to maximize strength and function.    Time 8    Period Weeks    Status On-going    Target Date 02/22/21                 Plan - 01/16/21 1321    Clinical Impression Statement Session focused on combination of strengthening and manual techniques to decrease low back pain and improve tissue extensibility.  Patient is able to complete all exercises as instructed without increase in her pain. Added additional exercises today due to no reported pain upon arrival. She reports significant relief from hip and low back stretches. Encouraged patient to continue her HEP and follow-up as scheduled.  She has yet to achieve maximal benefit from physical  therapy and pt will benefit from additional PT services to address deficits in back pain and improve pain-free function at home.    Personal Factors and Comorbidities Fitness;Past/Current Experience;Time since onset of injury/illness/exacerbation    Examination-Activity Limitations Bed Mobility;Lift;Stairs;Squat;Stand;Transfers;Sit    Examination-Participation Restrictions Community Activity;Yard Work    Stability/Clinical Decision Making Stable/Uncomplicated    Rehab Potential Fair    PT Frequency 2x / week    PT Duration 8 weeks    PT Treatment/Interventions ADLs/Self Care Home  Management;Aquatic Therapy;Cryotherapy;Gait training;Stair training;Moist Heat;Functional mobility training;Therapeutic activities;Therapeutic exercise;Balance training;Neuromuscular re-education;Patient/family education;Manual techniques;Dry needling;Electrical Stimulation;Spinal Manipulations;Joint Manipulations    PT Next Visit Plan prone manual tx, adominal and low back strengthening;    PT Home Exercise Plan Medbridge Access Code: [email or web address]    Consulted and Agree with Plan of Care Patient           Patient will benefit from skilled therapeutic intervention in order to improve the following deficits and impairments:  Abnormal gait,Decreased activity tolerance,Decreased endurance,Decreased mobility,Decreased range of motion,Hypomobility,Difficulty walking,Decreased strength,Increased muscle spasms,Impaired perceived functional ability,Pain  Visit Diagnosis: Chronic bilateral low back pain, unspecified whether sciatica present  Decreased strength     Problem List Patient Active Problem List   Diagnosis Date Noted  . Centrilobular emphysema ([place]) 12/30/2020  . Lung nodule 12/30/2020  . Skin mole 12/21/2020  . History of depression 12/21/2020  . SVT (supraventricular tachycardia) ([place]) 11/07/2020  . Counseling on health promotion and disease prevention 10/24/2020  . Palpitations 08/25/2020  . DOE (dyspnea  on exertion) 08/25/2020  . Mixed hyperlipidemia 08/25/2020  . History of palpitations 07/05/2020  . Shortness of breath 07/05/2020  . Chronic bilateral low back pain without sciatica 06/06/2020  . Lumbar facet arthropathy 06/06/2020  . Myofascial pain 06/06/2020  . Chronic pain syndrome 06/06/2020  . Cannabis use disorder, mild, abuse 06/06/2020  . Fibromyalgia 06/06/2020  . Elevated lipids 03/09/2020  . Encounter to establish care 02/22/2020  . Chronic abdominal pain 02/22/2020  . Hot flashes 02/22/2020  . Chronic back pain 02/22/2020  . Anxiety 02/22/2020   Phillips Grout PT, DPT, GCS  Amber Livingston 01/16/2021, 2:35 PM  [facility] [address]. [place], [place], [address] Phone: [phone]   Fax:  [phone]  Name: Amber Livingston MRN: [id number] Date of Birth: 10-14-1960

## 2021-01-17 ENCOUNTER — Other Ambulatory Visit: Payer: Self-pay

## 2021-01-17 ENCOUNTER — Encounter: Payer: Self-pay | Admitting: Pulmonary Disease

## 2021-01-17 ENCOUNTER — Ambulatory Visit (INDEPENDENT_AMBULATORY_CARE_PROVIDER_SITE_OTHER): Payer: No Typology Code available for payment source | Admitting: Pulmonary Disease

## 2021-01-17 VITALS — BP 118/72 | HR 67 | Temp 97.2°F | Ht 69.0 in | Wt 120.4 lb

## 2021-01-17 DIAGNOSIS — R0602 Shortness of breath: Secondary | ICD-10-CM

## 2021-01-17 DIAGNOSIS — J432 Centrilobular emphysema: Secondary | ICD-10-CM

## 2021-01-17 MED ORDER — SPIRIVA RESPIMAT 2.5 MCG/ACT IN AERS
2.0000 | INHALATION_SPRAY | Freq: Every day | RESPIRATORY_TRACT | 3 refills | Status: DC
  Filled 2021-01-17: qty 4, fill #0

## 2021-01-17 NOTE — Patient Instructions (Addendum)
Shortness of breath --ARRANGE Pulmonary function test prior to next visit  Emphysema --CONTINUE Spiriva 2.5 mcg TWO puffs ONCE a day.  --CONTINUE Albuterol as needed for shortness of breath or wheezing  Lung nodules --Plan for 6 month CT. Call our office when this is completed to discuss   Follow-up with me in 3 months after PFTs

## 2021-01-17 NOTE — Progress Notes (Signed)
Subjective:   PATIENT ID: Amber Livingston GENDER: female DOB: [birth year], MRN: [id number]   HPI  Chief Complaint  Patient presents with  . Follow-up    Past two weekends had fibro flare ups with shortness of breath and pressure "like drowning"     Reason for Visit: Follow-up with emphysema  [name] is a 60 year old female former smoker (46 pack years) with SVT, HLD, spinal stenosis and atrial mass who presents for follow-up  Synopsis:  2021-2022: Revamped her life and changed her diet and starting to see doctors. She has had shortness of breath that limits her activity including vacuuming in her home. She has other chronic issues including back pain and fibromyalgia that affect her activity.  She recently had a CT lung screen completed on 11/07/20 which demonstrated emphysema and small multiple pulmonary nodules. She quit smoking February 2022. She is still taking lozenges. She uses edibles but does not smoke marijuana. Her shortness of breath has worsened in last 9 months. No wheezing or cough. She is not on inhalers.  01/17/21 Since our last visit, she stopped inhaling cannabis and feels like she has her chest opened up more. One week ago she started having a fibromyalgia flair associated with shortness of breath associated with chest pressure. A few days into she had productive cough with yellow sputum. No wheezing. Low grade fevers and no chills. Her home oximeter is 93%. Her shortness of breath seems to be noticeable/worsened when using her upper body including lifting and raising her arms. She is compliant with her Spiriva. She will use her Albuterol 2-3 times a week. Symptoms have resolved since the weather has warmed up. Did not know if she should have called our office  Social History: Former smoker. 46 pack-year. 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  Past Medical History:  Diagnosis Date  . Arthritis   . Cervical spinal stenosis   . Emphysema, unspecified ([place])   . Fibromyalgia    Stage 7  . Lumbar stenosis   . Uterine fibroid      No Known Allergies   Outpatient Medications Prior to Visit  Medication Sig Dispense Refill  . BEE POLLEN PO Take 1 capsule by mouth daily.    . Cyanocobalamin (B-12) 2500 MCG TABS Take by mouth daily.    Marland Kitchen D-Ribose (RIBOSE, D,) POWD 2,500 mg by Does not apply route in the morning and at bedtime.    . diphenhydramine-acetaminophen (TYLENOL PM) 25-500 MG TABS tablet Take 2 tablets by mouth at bedtime as needed.    Marland Kitchen estradiol (ESTRACE) 0.5 MG tablet Take 0.5 mg by mouth daily.    Marland Kitchen ibuprofen (ADVIL) 200 MG tablet Take 600 mg by mouth every 6 (six) hours as needed for moderate pain.    Marland Kitchen MAGNESIUM BISGLYCINATE PO Take 1,000 mg by mouth daily.    . medroxyPROGESTERone (PROVERA) 2.5 MG tablet Take 2.5 mg by mouth daily.    . nicotine polacrilex (COMMIT) 4 MG lozenge Take 4 mg by mouth as needed for smoking cessation.    . NON FORMULARY as needed. CBD gummies    . PROAIR HFA 108 (90 Base) MCG/ACT inhaler INHALE 2 PUFFS BY MOUTH EVERY 6 HOURS AS NEEDED FOR WHEEZING OR SHORT OF BREATH 34 g 11  . Tiotropium Bromide Monohydrate (SPIRIVA RESPIMAT) 2.5 MCG/ACT AERS Inhale 2 puffs into the lungs daily. 4 g  0  . traZODone (DESYREL) 50 MG tablet TAKE 1/2 TABLET(25MG  TOTAL) BY MOUTH AT BEDTIME 30 tablet 0  . Turmeric (QC TUMERIC COMPLEX PO) Take 1,000 mg by mouth daily.     No facility-administered medications prior to visit.    Review of Systems  Constitutional: Negative for chills, diaphoresis, fever, malaise/fatigue and weight loss.  HENT: Negative for congestion.   Respiratory: Positive for cough, sputum production and shortness of breath. Negative for hemoptysis and wheezing.   Cardiovascular: Negative for chest pain, palpitations and leg swelling.     Objective:    Vitals:   01/17/21 1146  BP: 118/72  Pulse: 67  Temp: (!) 97.2 F (36.2 C)  TempSrc: Temporal  SpO2: 100%  Weight: 120 lb 6.4 oz (54.6 kg)  Height: 5\' 9"  (1.753 m)   SpO2: 100 % (Room air) O2 Device: None (Room air)  Physical Exam: General: Well-appearing, no acute distress HENT: [place], AT Eyes: EOMI, no scleral icterus Respiratory: Clear to auscultation bilaterally.  No crackles, wheezing or rales Cardiovascular: RRR, -M/R/G, no JVD Extremities:-Edema,-tenderness Neuro: AAO x4, CNII-XII grossly intact Skin: Intact, no rashes or bruising Psych: Normal mood, normal affect  Data Reviewed:  Imaging: CT chest lung screening 11/06/2020-multiple small lung nodules with largest measuring 6.6 mm in the right lower lobe.  Background emphysema  PFT: None on file  Labs: CBC    Component Value Date/Time   WBC 4.2 09/27/2020 1042   RBC 3.99 09/27/2020 1042   HGB 12.7 09/27/2020 1042   HCT 37.5 09/27/2020 1042   PLT 321 09/27/2020 1042   MCV 94 09/27/2020 1042   MCH 31.8 09/27/2020 1042   MCHC 33.9 09/27/2020 1042   RDW 12.5 09/27/2020 1042   LYMPHSABS 2.0 03/02/2020 1846   EOSABS 0.3 03/02/2020 1846   BASOSABS 0.1 03/02/2020 1846   Absolute eos 03/02/20 300  Imaging, labs and test noted above have been reviewed independently by me.    Assessment & Plan:   Discussion: 60 year old female former smoker (46 pack-years) who presents for follow-up. Had an COPD exacerbation but did not seek medical attention, current asymptomatic. Discussed open communication with our clinic so we can assist with her medical needs as needed. Discussed inhaler compliance, smoker avoidance, and minimizing infection when able.  Shortness of breath Can be multi factorial in setting of emphysema, fibromyalgia, deconditioning. Start treatment with bronchodilators for emphysema first. Symptoms persistent so will arrange for breathing test --Pulmonary function test prior to next  visit  Emphysema --CONTINUE Spiriva 2.5 mcg TWO puffs ONCE a day. Rx sent --CONTINUE Albuterol as needed for shortness of breath or wheezing  Lung nodules --Plan for 6 month CT. Call our office when this is completed to discuss  Health Maintenance Immunization History  Administered Date(s) Administered  . Moderna Sars-Covid-2 Vaccination 11/18/2019, 12/16/2019   CT Lung Screen - not indicated. CT as above  No orders of the defined types were placed in this encounter.  No orders of the defined types were placed in this encounter.   No follow-ups on file.  I have spent a total time of 31-minutes on the day of the appointment reviewing prior documentation, coordinating care and discussing medical diagnosis and plan with the patient/family. Imaging, labs and tests included in this note have been reviewed and interpreted independently by me.  [place], MD [facility] 01/17/2021 12:01 PM  Office Number [phone]

## 2021-01-18 ENCOUNTER — Other Ambulatory Visit: Payer: Self-pay

## 2021-01-18 ENCOUNTER — Ambulatory Visit: Payer: Medicaid Other | Attending: Student in an Organized Health Care Education/Training Program

## 2021-01-18 DIAGNOSIS — M545 Low back pain, unspecified: Secondary | ICD-10-CM | POA: Diagnosis present

## 2021-01-18 DIAGNOSIS — G8929 Other chronic pain: Secondary | ICD-10-CM | POA: Insufficient documentation

## 2021-01-18 DIAGNOSIS — R531 Weakness: Secondary | ICD-10-CM | POA: Diagnosis present

## 2021-01-18 DIAGNOSIS — R2689 Other abnormalities of gait and mobility: Secondary | ICD-10-CM | POA: Insufficient documentation

## 2021-01-18 NOTE — Therapy (Signed)
[place] [facility] [address]. [place], [place], [address] Phone: [phone]   Fax:  [phone]  Physical Therapy Treatment  Patient Details  Name: Amber Livingston MRN: [id number] Date of Birth: 1961/02/08 Referring Provider (PT): Dr. Gillis Santa   Encounter Date: 01/18/2021   PT End of Session - 01/18/21 1322    Visit Number 39    Number of Visits 57    Date for PT Re-Evaluation 02/22/21    PT Start Time 1318    PT Stop Time 1400    PT Time Calculation (min) 42 min    Activity Tolerance Patient tolerated treatment well    Behavior During Therapy [facility] for tasks assessed/performed           Past Medical History:  Diagnosis Date  . Arthritis   . Cervical spinal stenosis   . Emphysema, unspecified ([place])   . Fibromyalgia    Stage 7  . Lumbar stenosis   . Uterine fibroid     Past Surgical History:  Procedure Laterality Date  . ablation    . APPENDECTOMY    . DILATION AND CURETTAGE OF UTERUS     x3 for miscarriage  . LAPAROSCOPY     Fibroid removal  . PALPITATION    . TONSILLECTOMY      There were no vitals filed for this visit.   Subjective Assessment - 01/18/21 1321    Subjective Pt reports minimal back pain upon arrival today. She has been active at home and was able to stretch this morning.  No specific questions or concerns currently.    Pertinent History fibromyalgia    Patient Stated Goals return to work, improve strength and mobility, decrease pain    Currently in Pain? Yes    Pain Score --   "minimal" Does not rate   Pain Location Back    Pain Orientation Right;Left    Pain Descriptors / Indicators Aching    Pain Type Chronic pain    Pain Onset More than a month ago    Pain Frequency Intermittent            TREATMENT   Manual Therapy Moist heat to lumbar spinein prone during interval history; STM tolumbar region bilateral paraspinals, QL, and hips with percussive and vibratory technique using  Theragun; Grade I-II CPA grade mobs L1-L5,30s/bout x2bouts/level; Grade I-II UPA grade mobs L1-L5 bilateral,30s/bout x2bouts/level; Supine FABER and FADIR stretches x 45s bilateral; Supine hip flexion stretches off edge of table x 45s bilateral; Supine thoracolumbar rotation stretches x 45s bilateral;   Ther-ex Prone alternating hip extension 2x10 BLE; Prone HS curls with manual resistance from therapist 2x10 BLE; Child's pose stretch 30s x 2; Cobra pose stretch30s x 2; Quadruped bird dogs2x 10 on each side; Quadruped fire hydrants2x 10 on each side; Supine bridges with posterior pelvic tilt x 10;   Pt educated throughout session about proper posture and technique with exercises. Improved exercise technique, movement at target joints, use of target muscles after min to mod verbal, visual, tactile cues.   Session focused on combination of strengthening and manual techniques to decrease low back pain and improve tissue extensibility. Patient is able to complete all exercises as instructed without increase in her pain. She reports significant relief from hip and low back stretches so continue during session today.Encouragedpatient to continue her HEP and follow-up as scheduled. She has yet to achieve maximal benefit from physical therapy and pt will benefit fromadditionalPT services to address deficits in  back pain and improve pain-free function at home.                               PT Long Term Goals - 12/29/20 1505      PT LONG TERM GOAL #1   Title Pt. will perform 5xSTS in 12 seconds or less to demonstrate improvements in LE strength and functional mobility.    Baseline 11/16: 19.4s; 08/03/20: 11.5s    Time 8    Period Weeks    Status Achieved      PT LONG TERM GOAL #2   Title Pt. will report ability to vaccuum for greater than 10 mins with no increase in back pain over 2/10 to improve pain free functional mobility.     Baseline 11/16: pt is currently not able to vaccuum for 4-5 minutes with back pain at 5/10; 08/03/20: Pt can vaccuum for 4-5 minutes, no pain but fatigue, rest for a minute and continue. 2/15 pt had to stop 3 times while vacuuming and rest for a minute, reported discomfort with activity. Biggest complaint of numbness; 12/14/20: Pt reports that her DOE is more limiting than her back pain at this time with respect to vacuuming    Time 8    Period Weeks    Status Partially Met    Target Date 02/22/21      PT LONG TERM GOAL #3   Title Pt. will improve FOTO to 50 to demonstrate improvement in pain free functional mobility.    Baseline 11/16: 35; 08/03/20: 63;    Time 4    Period Weeks    Status Achieved      PT LONG TERM GOAL #4   Title Pt. independent with HEP to increase B hip strength 1/2 muscle grade to improve standing tolerance/ pain-free household tasks.    Baseline Pt. demonstrates 5/5 strength bilaterally, except for bilat hip IR, ER, and flexion which scored 4/5 for bilat for each. 08/03/20: R/L Hip flexion: 4+/4+, hip extension: 4/4, hip abduction: 4+/4+, hip adduction: 4+/4; deferred to next session; 12/14/20: Hip strength: R/L hip flexion: 4+/4+, hip extension: 4/4, hip abduction: 4+/4+, hip adduction: 4+/4+    Time 8    Period Weeks    Status Achieved      PT LONG TERM GOAL #5   Title Patient will report and demonstrate the ability to perform finalized HEP independently, as well as an understanding on how to progress program to continue to maximize strength and function.    Time 8    Period Weeks    Status On-going    Target Date 02/22/21                 Plan - 01/18/21 1322    Clinical Impression Statement Session focused on combination of strengthening and manual techniques to decrease low back pain and improve tissue extensibility.  Patient is able to complete all exercises as instructed without increase in her pain. She reports significant relief from hip and low back  stretches so continue during session today. Encouraged patient to continue her HEP and follow-up as scheduled.  She has yet to achieve maximal benefit from physical therapy and pt will benefit from additional PT services to address deficits in back pain and improve pain-free function at home.    Personal Factors and Comorbidities Fitness;Past/Current Experience;Time since onset of injury/illness/exacerbation    Examination-Activity Limitations Bed Mobility;Lift;Stairs;Squat;Stand;Transfers;Sit    Examination-Participation Restrictions Community Activity;[employer]  Work    Stability/Clinical Decision Making Stable/Uncomplicated    Rehab Potential Fair    PT Frequency 2x / week    PT Duration 8 weeks    PT Treatment/Interventions ADLs/Self Care Home Management;Aquatic Therapy;Cryotherapy;Gait training;Stair training;Moist Heat;Functional mobility training;Therapeutic activities;Therapeutic exercise;Balance training;Neuromuscular re-education;Patient/family education;Manual techniques;Dry needling;Electrical Stimulation;Spinal Manipulations;Joint Manipulations    PT Next Visit Plan Progress note, prone manual tx, adominal and low back strengthening;    PT Home Exercise Plan Medbridge Access Code: [email or web address]    Consulted and Agree with Plan of Care Patient           Patient will benefit from skilled therapeutic intervention in order to improve the following deficits and impairments:  Abnormal gait,Decreased activity tolerance,Decreased endurance,Decreased mobility,Decreased range of motion,Hypomobility,Difficulty walking,Decreased strength,Increased muscle spasms,Impaired perceived functional ability,Pain  Visit Diagnosis: Chronic bilateral low back pain, unspecified whether sciatica present  Decreased strength     Problem List Patient Active Problem List   Diagnosis Date Noted  . Centrilobular emphysema ([place]) 12/30/2020  . Lung nodule 12/30/2020  . Skin mole 12/21/2020  . History of depression  12/21/2020  . SVT (supraventricular tachycardia) ([place]) 11/07/2020  . Counseling on health promotion and disease prevention 10/24/2020  . Palpitations 08/25/2020  . DOE (dyspnea on exertion) 08/25/2020  . Mixed hyperlipidemia 08/25/2020  . History of palpitations 07/05/2020  . Shortness of breath 07/05/2020  . Chronic bilateral low back pain without sciatica 06/06/2020  . Lumbar facet arthropathy 06/06/2020  . Myofascial pain 06/06/2020  . Chronic pain syndrome 06/06/2020  . Cannabis use disorder, mild, abuse 06/06/2020  . Fibromyalgia 06/06/2020  . Elevated lipids 03/09/2020  . Encounter to establish care 02/22/2020  . Chronic abdominal pain 02/22/2020  . Hot flashes 02/22/2020  . Chronic back pain 02/22/2020  . Anxiety 02/22/2020   Phillips Grout PT, DPT, GCS  Zayvier Caravello 01/18/2021, 4:31 PM  [place] [facility] [address]. [place], [place], [address] Phone: [phone]   Fax:  [phone]  Name: Amber Livingston MRN: [id number] Date of Birth: 04-Jul-1961

## 2021-01-19 ENCOUNTER — Other Ambulatory Visit: Payer: Self-pay

## 2021-01-23 ENCOUNTER — Ambulatory Visit: Payer: Medicaid Other

## 2021-01-23 ENCOUNTER — Telehealth: Payer: Self-pay | Admitting: Licensed Clinical Social Worker

## 2021-01-23 NOTE — Telephone Encounter (Signed)
Called the patient to schedule a 60 minute telephone follow-up visit. No answer; left voicemail with clinic contact information

## 2021-01-25 ENCOUNTER — Ambulatory Visit: Payer: Medicaid Other

## 2021-01-25 ENCOUNTER — Other Ambulatory Visit: Payer: Self-pay | Admitting: Gerontology

## 2021-01-25 ENCOUNTER — Other Ambulatory Visit: Payer: Self-pay

## 2021-01-25 DIAGNOSIS — R531 Weakness: Secondary | ICD-10-CM

## 2021-01-25 DIAGNOSIS — M545 Low back pain, unspecified: Secondary | ICD-10-CM | POA: Diagnosis not present

## 2021-01-25 DIAGNOSIS — G8929 Other chronic pain: Secondary | ICD-10-CM

## 2021-01-25 DIAGNOSIS — R232 Flushing: Secondary | ICD-10-CM

## 2021-01-25 MED ORDER — MEDROXYPROGESTERONE ACETATE 2.5 MG PO TABS
2.5000 mg | ORAL_TABLET | Freq: Every day | ORAL | 0 refills | Status: DC
  Filled 2021-01-25: qty 30, 30d supply, fill #0

## 2021-01-25 MED ORDER — ESTRADIOL 0.5 MG PO TABS
0.5000 mg | ORAL_TABLET | Freq: Every day | ORAL | 0 refills | Status: DC
  Filled 2021-01-25: qty 30, 30d supply, fill #0

## 2021-01-25 NOTE — Therapy (Signed)
[facility] [address] [place], [place], [address] Phone: [phone]   Fax:  [phone]  Physical Therapy Progress Note   Dates of reporting period  12/14/20   to   01/25/21   Patient Details  Name: Amber Livingston MRN: [id number] Date of Birth: March 30, 1961 Referring Provider (PT): Dr. Gillis Santa   Encounter Date: 01/25/2021   PT End of Session - 01/25/21 1322     Visit Number 40    Number of Visits 20    Date for PT Re-Evaluation 02/22/21    PT Start Time 1316    PT Stop Time 1400    PT Time Calculation (min) 44 min    Activity Tolerance Patient tolerated treatment well    Behavior During Therapy [facility] for tasks assessed/performed             Past Medical History:  Diagnosis Date   Arthritis    Cervical spinal stenosis    Emphysema, unspecified ([place])    Fibromyalgia    Stage 7   Lumbar stenosis    Uterine fibroid     Past Surgical History:  Procedure Laterality Date   ablation     APPENDECTOMY     DILATION AND CURETTAGE OF UTERUS     x3 for miscarriage   LAPAROSCOPY     Fibroid removal   PALPITATION     TONSILLECTOMY      There were no vitals filed for this visit.   Subjective Assessment - 01/25/21 1319     Subjective Pt reports that she was very fatigued after the weekend. She was unloading storage units and was so fatigued afterward that she has been in bed since Tuesday.  No specific questions upon arrival. Pt denies any resting pain.    Pertinent History fibromyalgia    Patient Stated Goals return to work, improve strength and mobility, decrease pain    Currently in Pain? No/denies    Pain Onset --                TREATMENT      Manual Therapy  Moist heat to lumbar spine in prone during interval history and while pt completed FOTO; STM to lumbar region bilateral paraspinals, QL, and hips with percussive and vibratory technique using Theragun; Grade I-II CPA grade mobs L1-L5, 30s/bout x 2  bouts/level; Grade I-II UPA grade mobs L1-L5 bilateral, 30s/bout x 2 bouts/level; Supine FABER and FADIR stretches x 45s bilateral; Supine hip flexion stretches off edge of table x 45s bilateral; Supine thoracolumbar rotation stretches x 45s bilateral;     Ther-ex  Prone alternating hip extension 2x10 BLE; Child's pose stretch 30s x 2; Cobra pose stretch 30s x 2; Quadruped bird dogs 2 x 10 on each side; Quadruped fire hydrants 2 x 10 on each side;      Pt educated throughout session about proper posture and technique with exercises. Improved exercise technique, movement at target joints, use of target muscles after min to mod verbal, visual, tactile cues.      Session focused on combination of strengthening and manual techniques to decrease low back pain and improve tissue extensibility.  Patient is able to complete all exercises as instructed without increase in her pain. She reports significant relief from hip and low back stretches so continue during session today. Updated goals with patient today. Encouraged patient to continue her HEP and follow-up as scheduled.  She has yet to achieve maximal benefit from physical therapy and  pt will benefit from additional PT services to address deficits in back pain and improve pain-free function at home.                               PT Long Term Goals - 01/25/21 1322       PT LONG TERM GOAL #1   Title Pt. will perform 5xSTS in 12 seconds or less to demonstrate improvements in LE strength and functional mobility.    Baseline 11/16: 19.4s; 08/03/20: 11.5s    Time 8    Period Weeks    Status Achieved      PT LONG TERM GOAL #2   Title Pt. will report ability to vaccuum for greater than 10 mins with no increase in back pain over 2/10 to improve pain free functional mobility.    Baseline 11/16: pt is currently not able to vaccuum for 4-5 minutes with back pain at 5/10; 08/03/20: Pt can vaccuum for 4-5 minutes, no pain but  fatigue, rest for a minute and continue. 2/15 pt had to stop 3 times while vacuuming and rest for a minute, reported discomfort with activity. Biggest complaint of numbness; 12/14/20: Pt reports that her DOE is more limiting than her back pain at this time with respect to vacuuming. 01/25/21: DOE has been improving and she has been able vaccuum however she deals with significant fatigue and pain afterward;    Time 8    Period Weeks    Status Achieved      PT LONG TERM GOAL #3   Title Pt. will improve FOTO to 50 to demonstrate improvement in pain free functional mobility.    Baseline 11/16: 35; 08/03/20: 63; 01/25/21: 48    Time 4    Period Weeks    Status Partially Met    Target Date 02/22/21      PT LONG TERM GOAL #4   Title Pt. independent with HEP to increase B hip strength 1/2 muscle grade to improve standing tolerance/ pain-free household tasks.    Baseline Pt. demonstrates 5/5 strength bilaterally, except for bilat hip IR, ER, and flexion which scored 4/5 for bilat for each. 08/03/20: R/L Hip flexion: 4+/4+, hip extension: 4/4, hip abduction: 4+/4+, hip adduction: 4+/4; deferred to next session; 12/14/20: Hip strength: R/L hip flexion: 4+/4+, hip extension: 4/4, hip abduction: 4+/4+, hip adduction: 4+/4+    Time 8    Period Weeks    Status Achieved      PT LONG TERM GOAL #5   Title Patient will report and demonstrate the ability to perform finalized HEP independently, as well as an understanding on how to progress program to continue to maximize strength and function.    Time 8    Period Weeks    Status Achieved                   Plan - 01/25/21 1322     Clinical Impression Statement Session focused on combination of strengthening and manual techniques to decrease low back pain and improve tissue extensibility.  Patient is able to complete all exercises as instructed without increase in her pain. She reports significant relief from hip and low back stretches so continue during  session today. Updated goals with patient today. Encouraged patient to continue her HEP and follow-up as scheduled.  She has yet to achieve maximal benefit from physical therapy and pt will benefit from additional PT services to address deficits  in back pain and improve pain-free function at home.    Personal Factors and Comorbidities Fitness;Past/Current Experience;Time since onset of injury/illness/exacerbation    Examination-Activity Limitations Bed Mobility;Lift;Stairs;Squat;Stand;Transfers;Sit    Examination-Participation Restrictions Community Activity;[employer]ard Work    Stability/Clinical Decision Making Stable/Uncomplicated    Rehab Potential Fair    PT Frequency 2x / week    PT Duration 8 weeks    PT Treatment/Interventions ADLs/Self Care Home Management;Aquatic Therapy;Cryotherapy;Gait training;Stair training;Moist Heat;Functional mobility training;Therapeutic activities;Therapeutic exercise;Balance training;Neuromuscular re-education;Patient/family education;Manual techniques;Dry needling;Electrical Stimulation;Spinal Manipulations;Joint Manipulations    PT Next Visit Plan prone manual tx, adominal and low back strengthening;    PT Home Exercise Plan Medbridge Access Code: [email or web address]    Consulted and Agree with Plan of Care Patient             Patient will benefit from skilled therapeutic intervention in order to improve the following deficits and impairments:  Abnormal gait, Decreased activity tolerance, Decreased endurance, Decreased mobility, Decreased range of motion, Hypomobility, Difficulty walking, Decreased strength, Increased muscle spasms, Impaired perceived functional ability, Pain  Visit Diagnosis: Chronic bilateral low back pain, unspecified whether sciatica present  Decreased strength     Problem List Patient Active Problem List   Diagnosis Date Noted   Centrilobular emphysema ([place]) 12/30/2020   Lung nodule 12/30/2020   Skin mole 12/21/2020   History of depression  12/21/2020   SVT (supraventricular tachycardia) ([place]) 11/07/2020   Counseling on health promotion and disease prevention 10/24/2020   Palpitations 08/25/2020   DOE (dyspnea on exertion) 08/25/2020   Mixed hyperlipidemia 08/25/2020   History of palpitations 07/05/2020   Shortness of breath 07/05/2020   Chronic bilateral low back pain without sciatica 06/06/2020   Lumbar facet arthropathy 06/06/2020   Myofascial pain 06/06/2020   Chronic pain syndrome 06/06/2020   Cannabis use disorder, mild, abuse 06/06/2020   Fibromyalgia 06/06/2020   Elevated lipids 03/09/2020   Encounter to establish care 02/22/2020   Chronic abdominal pain 02/22/2020   Hot flashes 02/22/2020   Chronic back pain 02/22/2020   Anxiety 02/22/2020   Lyndel Safe  PT, DPT, GCS  , 01/26/2021, 2:19 PM  [facility] [address]. [place], [place], [address] Phone: [phone]   Fax:  [phone]  Name: Amber Livingston MRN: [id number] Date of Birth: Oct 17, 1960

## 2021-01-26 ENCOUNTER — Other Ambulatory Visit: Payer: Self-pay

## 2021-01-29 ENCOUNTER — Other Ambulatory Visit: Payer: Self-pay

## 2021-01-29 ENCOUNTER — Other Ambulatory Visit: Payer: Self-pay | Admitting: Surgical

## 2021-01-29 DIAGNOSIS — R232 Flushing: Secondary | ICD-10-CM

## 2021-01-29 MED ORDER — ESTRADIOL 0.5 MG PO TABS: 0.5000 mg | ORAL_TABLET | Freq: Every day | ORAL | 0 refills | Status: DC

## 2021-01-29 MED ORDER — MEDROXYPROGESTERONE ACETATE 2.5 MG PO TABS: 2.5000 mg | ORAL_TABLET | Freq: Every day | ORAL | 0 refills | Status: DC

## 2021-01-30 ENCOUNTER — Other Ambulatory Visit: Payer: Self-pay

## 2021-01-30 ENCOUNTER — Ambulatory Visit: Payer: Medicaid Other

## 2021-01-30 DIAGNOSIS — M545 Low back pain, unspecified: Secondary | ICD-10-CM | POA: Diagnosis not present

## 2021-01-30 DIAGNOSIS — G8929 Other chronic pain: Secondary | ICD-10-CM

## 2021-01-30 DIAGNOSIS — R531 Weakness: Secondary | ICD-10-CM

## 2021-01-30 NOTE — Therapy (Signed)
[facility] [address]. [place], [place], [address] Phone: [phone]   Fax:  [phone]  Physical Therapy Treatment  Patient Details  Name: Amber Livingston MRN: [id number] Date of Birth: 1961-02-08 Referring Provider (PT): Dr. Gillis Santa   Encounter Date: 01/30/2021   PT End of Session - 01/30/21 1326     Visit Number 41    Number of Visits 57    Date for PT Re-Evaluation 02/22/21    PT Start Time 1316    PT Stop Time 1400    PT Time Calculation (min) 44 min    Activity Tolerance Patient tolerated treatment well    Behavior During Therapy [facility] for tasks assessed/performed             Past Medical History:  Diagnosis Date   Arthritis    Cervical spinal stenosis    Emphysema, unspecified ([place])    Fibromyalgia    Stage 7   Lumbar stenosis    Uterine fibroid     Past Surgical History:  Procedure Laterality Date   ablation     APPENDECTOMY     DILATION AND CURETTAGE OF UTERUS     x3 for miscarriage   LAPAROSCOPY     Fibroid removal   PALPITATION     TONSILLECTOMY      There were no vitals filed for this visit.   Subjective Assessment - 01/30/21 1320     Subjective Pt states her pain has ranged from 0-1 on NPS today. States she was fatigued and sore on Friday. Lumbar extension leads to increased pain. She states she has been performing HEP and has been very active over the past week. No specific questions upon arrival.    Pertinent History fibromyalgia    Patient Stated Goals return to work, improve strength and mobility, decrease pain    Currently in Pain? Yes    Pain Score 1     Pain Location Back    Pain Orientation Lower    Pain Descriptors / Indicators Aching    Pain Type Chronic pain    Pain Onset More than a month ago    Pain Frequency Intermittent                 TREATMENT      Manual Therapy  Moist heat to lumbar spine in prone during history; STM to lumbar region bilateral  paraspinals, QL, and hips with percussive and vibratory technique using Theragun; Grade I-II CPA grade mobs L1-L5, 30s/bout x 2 bouts/level; Grade I-II UPA grade mobs L1-L5 bilateral, 30s/bout x 2 bouts/level; Prone press ups with overpressure at L4-5 by therapist; Supine FABER and FADIR stretches x 45s bilateral; Supine thoracolumbar rotation stretches x 45s bilateral;     Ther-ex  Child's pose stretch 30s x 3; Cobra pose stretch 30s x 2; Quadruped bird dogs 2 x 10 on each side; Quadruped bear pose x 10; Quadruped cat/cows x 10; Dead bug 1 x 10 each side; Straight leg bridge with heels elevated on PB 2 x 10; Bridges 1 x 10; Pelvic tilts, anterior and posterior 1 x 10;      Pt educated throughout session about proper posture and technique with exercises. Improved exercise technique, movement at target joints, use of target muscles after min to mod verbal, visual, tactile cues.      Session focused on combination of strengthening and manual techniques to decrease low back pain and improve tissue extensibility. Patient is able to complete  all exercises as instructed without increase in her pain. Progressed core stability exercises today with bear pose and dead bug. She reports significant relief from hip and low back stretches so continued during session today. Encouraged patient to continue her HEP and follow-up as scheduled.  She has yet to achieve maximal benefit from physical therapy and pt will benefit from additional PT services to address deficits in back pain and improve pain-free function at home.           PT Long Term Goals - 01/25/21 1322       PT LONG TERM GOAL #1   Title Pt. will perform 5xSTS in 12 seconds or less to demonstrate improvements in LE strength and functional mobility.    Baseline 11/16: 19.4s; 08/03/20: 11.5s    Time 8    Period Weeks    Status Achieved      PT LONG TERM GOAL #2   Title Pt. will report ability to vaccuum for greater than 10 mins  with no increase in back pain over 2/10 to improve pain free functional mobility.    Baseline 11/16: pt is currently not able to vaccuum for 4-5 minutes with back pain at 5/10; 08/03/20: Pt can vaccuum for 4-5 minutes, no pain but fatigue, rest for a minute and continue. 2/15 pt had to stop 3 times while vacuuming and rest for a minute, reported discomfort with activity. Biggest complaint of numbness; 12/14/20: Pt reports that her DOE is more limiting than her back pain at this time with respect to vacuuming. 01/25/21: DOE has been improving and she has been able vaccuum however she deals with significant fatigue and pain afterward;    Time 8    Period Weeks    Status Achieved      PT LONG TERM GOAL #3   Title Pt. will improve FOTO to 50 to demonstrate improvement in pain free functional mobility.    Baseline 11/16: 35; 08/03/20: 63; 01/25/21: 48    Time 4    Period Weeks    Status Partially Met    Target Date 02/22/21      PT LONG TERM GOAL #4   Title Pt. independent with HEP to increase B hip strength 1/2 muscle grade to improve standing tolerance/ pain-free household tasks.    Baseline Pt. demonstrates 5/5 strength bilaterally, except for bilat hip IR, ER, and flexion which scored 4/5 for bilat for each. 08/03/20: R/L Hip flexion: 4+/4+, hip extension: 4/4, hip abduction: 4+/4+, hip adduction: 4+/4; deferred to next session; 12/14/20: Hip strength: R/L hip flexion: 4+/4+, hip extension: 4/4, hip abduction: 4+/4+, hip adduction: 4+/4+    Time 8    Period Weeks    Status Achieved      PT LONG TERM GOAL #5   Title Patient will report and demonstrate the ability to perform finalized HEP independently, as well as an understanding on how to progress program to continue to maximize strength and function.    Time 8    Period Weeks    Status Achieved                   Plan - 01/31/21 1027     Clinical Impression Statement Session focused on combination of strengthening and manual  techniques to decrease low back pain and improve tissue extensibility. Patient is able to complete all exercises as instructed without increase in her pain. Progressed core stability exercises today with bear pose and dead bug. She reports significant relief from  hip and low back stretches so continued during session today. Encouraged patient to continue her HEP and follow-up as scheduled.  She has yet to achieve maximal benefit from physical therapy and pt will benefit from additional PT services to address deficits in back pain and improve pain-free function at home.    Personal Factors and Comorbidities Fitness;Past/Current Experience;Time since onset of injury/illness/exacerbation    Examination-Activity Limitations Bed Mobility;Lift;Stairs;Squat;Stand;Transfers;Sit    Examination-Participation Restrictions Community Activity;Yard Work    Stability/Clinical Decision Making Stable/Uncomplicated    Rehab Potential Fair    PT Frequency 2x / week    PT Duration 8 weeks    PT Treatment/Interventions ADLs/Self Care Home Management;Aquatic Therapy;Cryotherapy;Gait training;Stair training;Moist Heat;Functional mobility training;Therapeutic activities;Therapeutic exercise;Balance training;Neuromuscular re-education;Patient/family education;Manual techniques;Dry needling;Electrical Stimulation;Spinal Manipulations;Joint Manipulations    PT Next Visit Plan prone manual tx, adominal and low back strengthening;    PT Home Exercise Plan Medbridge Access Code: [email or web address]    Consulted and Agree with Plan of Care Patient             Patient will benefit from skilled therapeutic intervention in order to improve the following deficits and impairments:  Abnormal gait, Decreased activity tolerance, Decreased endurance, Decreased mobility, Decreased range of motion, Hypomobility, Difficulty walking, Decreased strength, Increased muscle spasms, Impaired perceived functional ability, Pain  Visit Diagnosis: Chronic  bilateral low back pain, unspecified whether sciatica present  Decreased strength     Problem List Patient Active Problem List   Diagnosis Date Noted   Centrilobular emphysema ([place]) 12/30/2020   Lung nodule 12/30/2020   Skin mole 12/21/2020   History of depression 12/21/2020   SVT (supraventricular tachycardia) ([place]) 11/07/2020   Counseling on health promotion and disease prevention 10/24/2020   Palpitations 08/25/2020   DOE (dyspnea on exertion) 08/25/2020   Mixed hyperlipidemia 08/25/2020   History of palpitations 07/05/2020   Shortness of breath 07/05/2020   Chronic bilateral low back pain without sciatica 06/06/2020   Lumbar facet arthropathy 06/06/2020   Myofascial pain 06/06/2020   Chronic pain syndrome 06/06/2020   Cannabis use disorder, mild, abuse 06/06/2020   Fibromyalgia 06/06/2020   Elevated lipids 03/09/2020   Encounter to establish care 02/22/2020   Chronic abdominal pain 02/22/2020   Hot flashes 02/22/2020   Chronic back pain 02/22/2020   Anxiety 02/22/2020   Lyndel Safe Lariah Fleer PT, DPT, GCS [place] 01/31/2021, 10:28 AM  [facility] [address]. [place], [place], [address] Phone: [phone]   Fax:  [phone]  Name: Amber Livingston MRN: [id number] Date of Birth: 1960-12-14

## 2021-01-31 ENCOUNTER — Other Ambulatory Visit: Payer: Self-pay

## 2021-02-01 ENCOUNTER — Other Ambulatory Visit: Payer: Self-pay

## 2021-02-01 ENCOUNTER — Ambulatory Visit: Payer: Self-pay | Admitting: Licensed Clinical Social Worker

## 2021-02-01 ENCOUNTER — Ambulatory Visit: Payer: Medicaid Other

## 2021-02-01 DIAGNOSIS — M545 Low back pain, unspecified: Secondary | ICD-10-CM | POA: Diagnosis not present

## 2021-02-01 DIAGNOSIS — F411 Generalized anxiety disorder: Secondary | ICD-10-CM

## 2021-02-01 DIAGNOSIS — F341 Dysthymic disorder: Secondary | ICD-10-CM

## 2021-02-01 DIAGNOSIS — G8929 Other chronic pain: Secondary | ICD-10-CM

## 2021-02-01 DIAGNOSIS — R2689 Other abnormalities of gait and mobility: Secondary | ICD-10-CM

## 2021-02-01 DIAGNOSIS — F451 Undifferentiated somatoform disorder: Secondary | ICD-10-CM

## 2021-02-01 DIAGNOSIS — R531 Weakness: Secondary | ICD-10-CM

## 2021-02-01 NOTE — Therapy (Signed)
[facility] [address]. [place], [place], [address] Phone: [phone]   Fax:  [phone]  Physical Therapy Treatment  Patient Details  Name: Amber Livingston MRN: [id number] Date of Birth: Jun 14, 1961 Referring Provider (PT): Dr. Gillis Santa   Encounter Date: 02/01/2021   PT End of Session - 02/01/21 1337     Visit Number 42    Number of Visits 72    Date for PT Re-Evaluation 02/22/21    PT Start Time 1316    PT Stop Time 1400    PT Time Calculation (min) 44 min    Activity Tolerance Patient tolerated treatment well    Behavior During Therapy [facility] for tasks assessed/performed             Past Medical History:  Diagnosis Date   Arthritis    Cervical spinal stenosis    Emphysema, unspecified ([place])    Fibromyalgia    Stage 7   Lumbar stenosis    Uterine fibroid     Past Surgical History:  Procedure Laterality Date   ablation     APPENDECTOMY     DILATION AND CURETTAGE OF UTERUS     x3 for miscarriage   LAPAROSCOPY     Fibroid removal   PALPITATION     TONSILLECTOMY      There were no vitals filed for this visit.   Subjective Assessment - 02/01/21 1321     Subjective Pt states her pain has ranged from 1/10 on NPS today. States she was fatigued and sore after the last session however yesterday she had a really good day. She noticed improved lumbar extension yesterday. Specific questions upon arrival.    Pertinent History fibromyalgia    Patient Stated Goals return to work, improve strength and mobility, decrease pain    Currently in Pain? Yes    Pain Score 1     Pain Location Back    Pain Orientation Lower    Pain Descriptors / Indicators Aching    Pain Type Chronic pain    Pain Onset More than a month ago    Pain Frequency Intermittent               TREATMENT   Manual Therapy  Moist heat to lumbar spine in prone during history; STM to lumbar region bilateral paraspinals, QL, and hips with  percussive and vibratory technique using Theragun; Grade I-II CPA grade mobs L1-L5, 30s/bout x 2 bouts/level; Grade I-II UPA grade mobs L1-L5 bilateral, 30s/bout x 2 bouts/level; Prone press ups with overpressure at L4-5 by therapist; Supine FABER and FADIR stretches x 45s bilateral; Supine thoracolumbar rotation stretches x 45s bilateral;     Ther-ex  Child's pose stretch 30s x 3; Cobra pose stretch 30s x 2; Quadruped bird dogs x 10 on each side; Quadruped fire hydrants x 10 BLE; Quadruped bear pose x 10; Quadruped cat/cows x 10; Posterior pelvic tilt with alternating SLR x 10 BLE; Bridge marches with posterior pelvic tilt x 10 BLE;      Pt educated throughout session about proper posture and technique with exercises. Improved exercise technique, movement at target joints, use of target muscles after min to mod verbal, visual, tactile cues.      Session focused on combination of strengthening and manual techniques to decrease low back pain and improve tissue extensibility. Patient is able to complete all exercises as instructed without increase in her pain. She reports significant relief from hip and low back stretches  so continued during session today. Pt educated on importance of home program and plans to progress toward discharge over the coming weeks. Encouraged patient to continue her HEP and follow-up as scheduled.  She has yet to achieve maximal benefit from physical therapy and pt will benefit from additional PT services to address deficits in back pain and improve pain-free function at home.                            PT Long Term Goals - 01/25/21 1322       PT LONG TERM GOAL #1   Title Pt. will perform 5xSTS in 12 seconds or less to demonstrate improvements in LE strength and functional mobility.    Baseline 11/16: 19.4s; 08/03/20: 11.5s    Time 8    Period Weeks    Status Achieved      PT LONG TERM GOAL #2   Title Pt. will report ability to  vaccuum for greater than 10 mins with no increase in back pain over 2/10 to improve pain free functional mobility.    Baseline 11/16: pt is currently not able to vaccuum for 4-5 minutes with back pain at 5/10; 08/03/20: Pt can vaccuum for 4-5 minutes, no pain but fatigue, rest for a minute and continue. 2/15 pt had to stop 3 times while vacuuming and rest for a minute, reported discomfort with activity. Biggest complaint of numbness; 12/14/20: Pt reports that her DOE is more limiting than her back pain at this time with respect to vacuuming. 01/25/21: DOE has been improving and she has been able vaccuum however she deals with significant fatigue and pain afterward;    Time 8    Period Weeks    Status Achieved      PT LONG TERM GOAL #3   Title Pt. will improve FOTO to 50 to demonstrate improvement in pain free functional mobility.    Baseline 11/16: 35; 08/03/20: 63; 01/25/21: 48    Time 4    Period Weeks    Status Partially Met    Target Date 02/22/21      PT LONG TERM GOAL #4   Title Pt. independent with HEP to increase B hip strength 1/2 muscle grade to improve standing tolerance/ pain-free household tasks.    Baseline Pt. demonstrates 5/5 strength bilaterally, except for bilat hip IR, ER, and flexion which scored 4/5 for bilat for each. 08/03/20: R/L Hip flexion: 4+/4+, hip extension: 4/4, hip abduction: 4+/4+, hip adduction: 4+/4; deferred to next session; 12/14/20: Hip strength: R/L hip flexion: 4+/4+, hip extension: 4/4, hip abduction: 4+/4+, hip adduction: 4+/4+    Time 8    Period Weeks    Status Achieved      PT LONG TERM GOAL #5   Title Patient will report and demonstrate the ability to perform finalized HEP independently, as well as an understanding on how to progress program to continue to maximize strength and function.    Time 8    Period Weeks    Status Achieved                   Plan - 02/01/21 1404     Clinical Impression Statement Session focused on combination of  strengthening and manual techniques to decrease low back pain and improve tissue extensibility. Patient is able to complete all exercises as instructed without increase in her pain. She reports significant relief from hip and low back stretches so continued  during session today. Pt educated on importance of home program and plans to progress toward discharge over the coming weeks. Encouraged patient to continue her HEP and follow-up as scheduled.  She has yet to achieve maximal benefit from physical therapy and pt will benefit from additional PT services to address deficits in back pain and improve pain-free function at home.    Personal Factors and Comorbidities Fitness;Past/Current Experience;Time since onset of injury/illness/exacerbation    Examination-Activity Limitations Bed Mobility;Lift;Stairs;Squat;Stand;Transfers;Sit    Examination-Participation Restrictions Community Activity;Yard Work    Stability/Clinical Decision Making Stable/Uncomplicated    Rehab Potential Fair    PT Frequency 2x / week    PT Duration 8 weeks    PT Treatment/Interventions ADLs/Self Care Home Management;Aquatic Therapy;Cryotherapy;Gait training;Stair training;Moist Heat;Functional mobility training;Therapeutic activities;Therapeutic exercise;Balance training;Neuromuscular re-education;Patient/family education;Manual techniques;Dry needling;Electrical Stimulation;Spinal Manipulations;Joint Manipulations    PT Next Visit Plan prone manual tx, adominal and low back strengthening;    PT Home Exercise Plan Medbridge Access Code: [email or web address]    Consulted and Agree with Plan of Care Patient             Patient will benefit from skilled therapeutic intervention in order to improve the following deficits and impairments:  Abnormal gait, Decreased activity tolerance, Decreased endurance, Decreased mobility, Decreased range of motion, Hypomobility, Difficulty walking, Decreased strength, Increased muscle spasms, Impaired  perceived functional ability, Pain  Visit Diagnosis: Chronic bilateral low back pain, unspecified whether sciatica present  Decreased strength  Decreased mobility     Problem List Patient Active Problem List   Diagnosis Date Noted   Centrilobular emphysema ([place]) 12/30/2020   Lung nodule 12/30/2020   Skin mole 12/21/2020   History of depression 12/21/2020   SVT (supraventricular tachycardia) ([place]) 11/07/2020   Counseling on health promotion and disease prevention 10/24/2020   Palpitations 08/25/2020   DOE (dyspnea on exertion) 08/25/2020   Mixed hyperlipidemia 08/25/2020   History of palpitations 07/05/2020   Shortness of breath 07/05/2020   Chronic bilateral low back pain without sciatica 06/06/2020   Lumbar facet arthropathy 06/06/2020   Myofascial pain 06/06/2020   Chronic pain syndrome 06/06/2020   Cannabis use disorder, mild, abuse 06/06/2020   Fibromyalgia 06/06/2020   Elevated lipids 03/09/2020   Encounter to establish care 02/22/2020   Chronic abdominal pain 02/22/2020   Hot flashes 02/22/2020   Chronic back pain 02/22/2020   Anxiety 02/22/2020   Lyndel Safe Zilda No PT, DPT, GCS  Juniel Groene 02/01/2021, 2:14 PM  [facility] [address] [place], [place], [address] Phone: [phone]   Fax:  [phone]  Name: Mckay Brandt MRN: [id number] Date of Birth: 1961-03-10

## 2021-02-01 NOTE — BH Specialist Note (Signed)
Integrated Behavioral Health Follow Up In-Person Visit  MRN: [id number] Name: Amber Livingston   Total time: 30 minutes  Types of Service: Telephone visit  Interpretor:No. Interpretor Name and Language:   Subjective: Amber Livingston is a 60 y.o. female accompanied by  herself Patient was referred by [name], NP for mental health. Patient reports the following symptoms/concerns: The patient reports that she has been doing worse since her last follow-up visit. She notes that he anxiety symptoms have increased and she is worrying excessively daily. She notes that she is spending all her time alone watching TV from her bed and does not do activities she typically enjoys such as gardening. The patient reports that she stopped smoking cannabis over a month ago due to her emphysema. She feels this has contributed to her increased symptoms. The patient stated that she requested her provider start her on an antidepressant, but was told she had to see a therapist first. She reports that she was previously on Cymbalta for a few years while she lived in [place] and that it was helpful. She stated she discontinued taking it for financial reasons. The patient states that she is taking her Trazodone as prescribed and her sleep has improved. She discussed her progress filing for disability and recently had a hearing with her attorney. The patient denies any suicidal or homicidal thoughts.   Severity of problem: moderate  Objective: Mood: Euthymic and Affect: Appropriate Risk of harm to self or others: No plan to harm self or others  Life Context: Family and Social: see above School/Work: see above Self-Care: see above Life Changes: see above  Patient and/or Family's Strengths/Protective Factors: Concrete supports in place (healthy food, safe environments, etc.) and Sense of purpose  Goals Addressed: Patient will:  Reduce symptoms of: agitation, anxiety, depression, and stress   Increase knowledge  and/or ability of: coping skills, healthy habits, self-management skills, and stress reduction   Demonstrate ability to: Increase healthy adjustment to current life circumstances  Progress towards Goals: Ongoing  Interventions: Interventions utilized:  Supportive Counseling was utilized by the clinician during today's follow up session. The clinician processed with the patient how they have been doing since the last follow-up session. The clinician provided a space for the patient to ventilate their frustrations regarding their current life circumstances. Clinician measured the patient's anxiety and depression on a numerical scale.  Clinician provided the patient an overview of relaxation techniques; guided imagery, progressive muscle relaxation, challenging irrational thoughts, and deep breathing.  Standardized Assessments completed: GAD-7 and PHQ 9 GAD-7    16 PHQ-9    14  Assessment: Patient currently experiencing see above.   Patient may benefit from see above.  Plan: Follow up with behavioral health clinician on : 02/13/2021 at 2:00 PM  Behavioral recommendations: Case consultation with Dr. Octavia Heir, MD, Psychiatric Consultant on Tuesday 02/06/2021 at 11:00 AM Referral(s): [facility] (In Clinic) "From scale of 1-10, how likely are you to follow plan?":   Lesli Albee, LCSWA

## 2021-02-02 ENCOUNTER — Telehealth: Payer: Self-pay | Admitting: Pharmacist

## 2021-02-02 ENCOUNTER — Other Ambulatory Visit: Payer: Self-pay

## 2021-02-02 ENCOUNTER — Telehealth: Payer: Self-pay | Admitting: Pulmonary Disease

## 2021-02-02 NOTE — Telephone Encounter (Signed)
02/02/2021 8:26:38 AM - Spiriva Respimat faxed to BI-enrollment  -- Elmer Picker - Friday, February 02, 2021 8:25 AM --Faxed [employer] application for Spiriva Respimat 2.26mcg Inhale 2 puffs into the lungs daily  # 3.  Patient has signed letter for medication to be shipped to our office.

## 2021-02-02 NOTE — Telephone Encounter (Signed)
Called and spoke with patient. She stated that she ran out of Spiriva last night and does not have any more. She only has her rescue inhaler. She wanted to see if JE had sent in a RX. I advised her that [facility] did sent in the RX on 01/17/21 to the medication clinic at [facility]. Also advised her that per her chart, they faxed a patient assistance application for the Spiriva this morning.   I offered to leave samples at the front desk for her but she stated that she does not have transportation. She will see if she can get someone to bring her to the office. She will call us back if she is able to get transportation.   Nothing further needed at time of call.

## 2021-02-06 ENCOUNTER — Other Ambulatory Visit: Payer: Self-pay | Admitting: Gerontology

## 2021-02-06 ENCOUNTER — Telehealth: Payer: Self-pay | Admitting: Licensed Clinical Social Worker

## 2021-02-06 ENCOUNTER — Other Ambulatory Visit: Payer: Self-pay

## 2021-02-06 DIAGNOSIS — F341 Dysthymic disorder: Secondary | ICD-10-CM

## 2021-02-06 MED ORDER — DULOXETINE HCL 30 MG PO CPEP
30.0000 mg | ORAL_CAPSULE | Freq: Every day | ORAL | 0 refills | Status: DC
  Filled 2021-02-06: qty 30, 30d supply, fill #0

## 2021-02-06 NOTE — Telephone Encounter (Signed)
Called the patient and explained the recommendations from the Psychiatric Case Consultation. Clinician explained the benefits, and potential side effects and risks of taking Cymbalta 30 MG daily in addition to answering patient questions regarding the medication and treatment plan. Patient was agreeable to the plans and treatment recommendations.

## 2021-02-07 ENCOUNTER — Other Ambulatory Visit: Payer: Self-pay

## 2021-02-08 ENCOUNTER — Ambulatory Visit: Payer: Medicaid Other

## 2021-02-08 ENCOUNTER — Ambulatory Visit
Payer: No Typology Code available for payment source | Attending: Student in an Organized Health Care Education/Training Program | Admitting: Student in an Organized Health Care Education/Training Program

## 2021-02-08 ENCOUNTER — Other Ambulatory Visit: Payer: Self-pay

## 2021-02-08 ENCOUNTER — Encounter: Payer: Self-pay | Admitting: Student in an Organized Health Care Education/Training Program

## 2021-02-08 VITALS — BP 127/99 | HR 69 | Temp 96.9°F | Resp 16 | Ht 69.0 in | Wt 117.0 lb

## 2021-02-08 DIAGNOSIS — G894 Chronic pain syndrome: Secondary | ICD-10-CM

## 2021-02-08 DIAGNOSIS — M797 Fibromyalgia: Secondary | ICD-10-CM

## 2021-02-08 DIAGNOSIS — F1210 Cannabis abuse, uncomplicated: Secondary | ICD-10-CM

## 2021-02-08 DIAGNOSIS — R531 Weakness: Secondary | ICD-10-CM

## 2021-02-08 DIAGNOSIS — M545 Low back pain, unspecified: Secondary | ICD-10-CM | POA: Diagnosis not present

## 2021-02-08 DIAGNOSIS — M47816 Spondylosis without myelopathy or radiculopathy, lumbar region: Secondary | ICD-10-CM

## 2021-02-08 DIAGNOSIS — G8929 Other chronic pain: Secondary | ICD-10-CM

## 2021-02-08 NOTE — Progress Notes (Signed)
Safety precautions to be maintained throughout the outpatient stay will include: orient to surroundings, keep bed in low position, maintain call bell within reach at all times, provide assistance with transfer out of bed and ambulation.  

## 2021-02-08 NOTE — Progress Notes (Signed)
PROVIDER NOTE: Information contained herein reflects review and annotations entered in association with encounter. Interpretation of such information and data should be left to medically-trained personnel. Information provided to patient can be located elsewhere in the medical record under "Patient Instructions". Document created using STT-dictation technology, any transcriptional errors that may result from process are unintentional.    Patient: Amber Livingston  Service Category: E/M  Provider: Gillis Santa, MD  DOB: Dec 01, 1960  DOS: 02/08/2021  Specialty: Interventional Pain Management  MRN: [id number]  Setting: Ambulatory outpatient  PCP: Malachy Mood, MD  Type: Established Patient    Referring Provider: Malachy Mood, MD  Location: Office  Delivery: Face-to-face     HPI  Amber Livingston, a 60 y.o. year old female, is here today because of her Fibromyalgia [M79.7]. Amber Livingston primary complain today is Back Pain (lower) Last encounter: My last encounter with her was on 10/12/20 Pertinent problems: Amber Livingston has Chronic back pain; Chronic bilateral low back pain without sciatica; Lumbar facet arthropathy; Myofascial pain; Chronic pain syndrome; Cannabis use disorder, mild, abuse; and Fibromyalgia on their pertinent problem list. Pain Assessment: Severity of Chronic pain is reported as a 5 /10. Location: Back Lower/ . Onset: More than a month ago. Quality: Other (Comment) (stiffness). Timing: Constant. Modifying factor(s): Ibuprofen, Epsom salts, CBD gummies. Vitals:  height is $RemoveB'5\' 9"'sdJtFSSx$  (7.902 m) and weight is 117 lb (53.1 kg). Her temporal temperature is 96.9 F (36.1 C) (abnormal). Her blood pressure is 127/99 (abnormal) and her pulse is 69. Her respiration is 16 and oxygen saturation is 100%.   Reason for encounter: worsening of previously known (established) problem   Patient presents today for worsening lumbar lower back pain related to fibromyalgia. Has been working with Corene Livingston at PT for her low  back which was somewhat helpful She is also utilizing Cymbalta at 30 mg daily prescribed by PCP Also on Ibuprofen and Magnesium We discussed the importance of physical therapy and continuing exercises at home. She can discuss titrating up Cymbalta with her PCP.  ROS  Constitutional: Denies any fever or chills Gastrointestinal: No reported hemesis, hematochezia, vomiting, or acute GI distress Musculoskeletal:  Lumbar spine pain Neurological: No reported episodes of acute onset apraxia, aphasia, dysarthria, agnosia, amnesia, paralysis, loss of coordination, or loss of consciousness  Medication Review  B-12, Bee Pollen, DULoxetine, Magnesium Bisglycinate, NON FORMULARY, Ribose (D), Tiotropium Bromide Monohydrate, Turmeric, albuterol, diphenhydramine-acetaminophen, estradiol, ibuprofen, medroxyPROGESTERone, nicotine polacrilex, and traZODone  History Review  Allergy: Amber Livingston has No Known Allergies. Drug: Amber Livingston  reports current drug use. Frequency: 7.00 times per week. Drug: Marijuana. Alcohol:  reports previous alcohol use. Tobacco:  reports that she quit smoking about 4 months ago. Her smoking use included cigarettes. She has a 46.00 pack-year smoking history. She has never used smokeless tobacco. Social: Amber Livingston  reports that she quit smoking about 4 months ago. Her smoking use included cigarettes. She has a 46.00 pack-year smoking history. She has never used smokeless tobacco. She reports previous alcohol use. She reports current drug use. Frequency: 7.00 times per week. Drug: Marijuana. Medical:  has a past medical history of Arthritis, Cervical spinal stenosis, Emphysema, unspecified ([place]), Fibromyalgia, Lumbar stenosis, and Uterine fibroid. Surgical: Amber Livingston  has a past surgical history that includes Dilation and curettage of uterus; laparoscopy; Appendectomy; ablation; PALPITATION; and Tonsillectomy. Family: family history includes Alcohol abuse in her maternal grandmother; Brain  cancer in her mother; Breast cancer (age of onset: 83) in her paternal grandmother; Dementia in her father,  paternal grandfather, and paternal uncle; Hypertension in her mother; Lung cancer in her mother; Prostate cancer in her father; Uterine cancer (age of onset: 81) in her mother.  Laboratory Chemistry Profile   Renal Lab Results  Component Value Date   BUN 11 09/27/2020   CREATININE 0.75 09/27/2020   BCR 15 09/27/2020   GFRAA 101 09/27/2020   GFRNONAA 88 09/27/2020     Hepatic Lab Results  Component Value Date   AST 14 03/02/2020   ALT 23 03/02/2020   ALBUMIN 4.7 03/02/2020   ALKPHOS 72 03/02/2020     Electrolytes Lab Results  Component Value Date   NA 141 09/27/2020   K 5.0 09/27/2020   CL 106 09/27/2020   CALCIUM 9.7 09/27/2020     Bone Lab Results  Component Value Date   VD25OH 43.7 07/05/2020     Inflammation (CRP: Acute Phase) (ESR: Chronic Phase) No results found for: CRP, ESRSEDRATE, LATICACIDVEN     Note: Above Lab results reviewed.  Recent Imaging Review  CT CHEST LUNG CANCER SCREENING LOW DOSE WO CONTRAST CLINICAL DATA:  60 year old female former smoker (quit February 2022) with 46 pack-year history of smoking. Lung cancer screening examination.  EXAM: CT CHEST WITHOUT CONTRAST LOW-DOSE FOR LUNG CANCER SCREENING  TECHNIQUE: Multidetector CT imaging of the chest was performed following the standard protocol without IV contrast.  COMPARISON:  No priors.  FINDINGS: Cardiovascular: Heart size is normal. There is no significant pericardial fluid, thickening or pericardial calcification. There is aortic atherosclerosis, as well as atherosclerosis of the great vessels of the mediastinum and the coronary arteries, including calcified atherosclerotic plaque in the left anterior descending coronary artery.  Mediastinum/Nodes: No pathologically enlarged mediastinal or hilar lymph nodes. Please note that accurate exclusion of hilar adenopathy is  limited on noncontrast CT scans. Esophagus is unremarkable in appearance. No axillary lymphadenopathy.  Lungs/Pleura: Multiple small pulmonary nodules are noted throughout the lungs bilaterally, largest of which is in the posterior aspect of the right lower lobe (axial image 289 of series 3), with a volume derived mean diameter of 6.6 mm. No other larger more suspicious appearing pulmonary nodules or masses are noted. No acute consolidative airspace disease. No pleural effusions. Diffuse bronchial wall thickening with mild centrilobular and paraseptal emphysema.  Upper Abdomen: Aortic atherosclerosis.  Musculoskeletal: There are no aggressive appearing lytic or blastic lesions noted in the visualized portions of the skeleton.  IMPRESSION: 1. Lung-RADS 3S, probably benign findings. Short-term follow-up in 6 months is recommended with repeat low-dose chest CT without contrast (please use the following order, "CT CHEST LCS NODULE FOLLOW-UP W/O CM"). 2. The "S" modifier above refers to potentially clinically significant non lung cancer related findings. Specifically, there is aortic atherosclerosis, in addition to left anterior descending coronary artery disease. Please note that although the presence of coronary artery calcium documents the presence of coronary artery disease, the severity of this disease and any potential stenosis cannot be assessed on this non-gated CT examination. Assessment for potential risk factor modification, dietary therapy or pharmacologic therapy may be warranted, if clinically indicated. 3. Mild diffuse bronchial wall thickening with mild centrilobular and paraseptal emphysema; imaging findings suggestive of underlying COPD.  Aortic Atherosclerosis (ICD10-I70.0) and Emphysema (ICD10-J43.9).  Electronically Signed   By: Vinnie Langton M.D.   On: 11/07/2020 14:17 Note: Reviewed        Physical Exam  General appearance: Well nourished, well  developed, and well hydrated. In no apparent acute distress Mental status: Alert, oriented x 3 (person,  place, & time)       Respiratory: No evidence of acute respiratory distress Eyes: PERLA Vitals: BP (!) 127/99   Pulse 69   Temp (!) 96.9 F (36.1 C) (Temporal)   Resp 16   Ht $R'5\' 9"'KO$  (1.753 m)   Wt 117 lb (53.1 kg)   SpO2 100%   BMI 17.28 kg/m  BMI: Estimated body mass index is 17.28 kg/m as calculated from the following:   Height as of this encounter: $RemoveBeforeD'5\' 9"'oqEatadYbIEGNP$  (1.753 m).   Weight as of this encounter: 117 lb (53.1 kg). Ideal: Ideal body weight: 66.2 kg (145 lb 15.1 oz) Lumbar musculoskeletal pain, pain with facet loading Assessment   Status Diagnosis  Having a Flare-up Having a Flare-up Controlled 1. Fibromyalgia   2. Cannabis use disorder, mild, abuse   3. Lumbar facet arthropathy   4. Chronic pain syndrome        Plan of Care   1.  Continue with exercise at home 2.  Continue Cymbalta management with PCP, recommend titrating up to 60 mg as tolerated 3.  Do not recommend chronic opioid management for this patient. 4.  Continue with magnesium and NSAID as needed  Follow-up plan:   Return in 5 months (on 07/11/2021) for virtual.   Recent Visits No visits were found meeting these conditions. Showing recent visits within past 90 days and meeting all other requirements Today's Visits Date Type Provider Dept  02/08/21 Office Visit Gillis Santa, MD [facility]  Showing today's visits and meeting all other requirements Future Appointments No visits were found meeting these conditions. Showing future appointments within next 90 days and meeting all other requirements I discussed the assessment and treatment plan with the patient. The patient was provided an opportunity to ask questions and all were answered. The patient agreed with the plan and demonstrated an understanding of the instructions.  Patient advised to call back or seek an in-person evaluation if the  symptoms or condition worsens.  Duration of encounter: 20 minutes.  Note by: Gillis Santa, MD Date: 02/08/2021; Time: 10:52 AM

## 2021-02-08 NOTE — Therapy (Signed)
[facility] [address] [place], [place], [address] Phone: [phone]   Fax:  [phone]  Physical Therapy Treatment  Patient Details  Name: Amber Livingston MRN: [id number] Date of Birth: 05/25/1961 Referring Provider (PT): Dr. Gillis Santa   Encounter Date: 02/08/2021   PT End of Session - 02/08/21 1357     Visit Number 43    Number of Visits 72    Date for PT Re-Evaluation 02/22/21    PT Start Time 1315    PT Stop Time 9381    PT Time Calculation (min) 40 min    Activity Tolerance Patient tolerated treatment well    Behavior During Therapy [facility] for tasks assessed/performed             Past Medical History:  Diagnosis Date   Arthritis    Cervical spinal stenosis    Emphysema, unspecified ([place])    Fibromyalgia    Stage 7   Lumbar stenosis    Uterine fibroid     Past Surgical History:  Procedure Laterality Date   ablation     APPENDECTOMY     DILATION AND CURETTAGE OF UTERUS     x3 for miscarriage   LAPAROSCOPY     Fibroid removal   PALPITATION     TONSILLECTOMY      There were no vitals filed for this visit.   Subjective Assessment - 02/08/21 1318     Subjective Pt states her pain has been really high over the last couple days. She saw Dr. Holley Raring this morning and left feeling encouraged about her conversation. She is going to start Cymbalta and it hopeful that it will be helpful. She plans to get a Hypervolt for home use following discharge. No specific questions upon arrival.    Pertinent History fibromyalgia    Patient Stated Goals return to work, improve strength and mobility, decrease pain    Currently in Pain? Yes    Pain Score --   Not rated today   Pain Location Back    Pain Orientation Lower    Pain Descriptors / Indicators Tightness    Pain Type Chronic pain    Pain Onset More than a month ago    Pain Frequency Constant                TREATMENT   Manual Therapy  Moist heat to  lumbar spine in prone during history; STM to lumbar region bilateral paraspinals, QL, and hips with percussive and vibratory technique using Theragun; Grade I-II CPA grade mobs L1-L5, 30s/bout x 2 bouts/level; Grade I-II UPA grade mobs L1-L5 bilateral, 30s/bout x 2 bouts/level; Supine FABER and FADIR stretches x 45s bilateral; Supine thoracolumbar rotation stretches x 45s bilateral;     Ther-ex  Prone alternating hip extension x 10 BLE; Prone HS curls x 10 BLE; Hooklying lumbar rocking x 1 minute; Hooklying bridges 2 x 10; Hooklying marches x 10; Hooklying SLR x 10; Hooklying clams with manual resistance x 10; Hooklying adductor squeezes with manual resistance x 10;     Pt educated throughout session about proper posture and technique with exercises. Improved exercise technique, movement at target joints, use of target muscles after min to mod verbal, visual, tactile cues.      Session focused on combination of strengthening and manual techniques to decrease low back pain and improve tissue extensibility. Patient is able to complete all exercises as instructed without increase in her pain. She reports significant relief from  hip and low back stretches so continued during session today. Pt educated on importance of home program and plans to purchase a Hypervolt so her husband can assist her at home with soft tissue mobilization. Encouraged patient to continue her HEP and follow-up as scheduled. Pt will benefit from additional PT services to address deficits in back pain and improve pain-free function at home.                                 PT Long Term Goals - 01/25/21 1322       PT LONG TERM GOAL #1   Title Pt. will perform 5xSTS in 12 seconds or less to demonstrate improvements in LE strength and functional mobility.    Baseline 11/16: 19.4s; 08/03/20: 11.5s    Time 8    Period Weeks    Status Achieved      PT LONG TERM GOAL #2   Title Pt. will report  ability to vaccuum for greater than 10 mins with no increase in back pain over 2/10 to improve pain free functional mobility.    Baseline 11/16: pt is currently not able to vaccuum for 4-5 minutes with back pain at 5/10; 08/03/20: Pt can vaccuum for 4-5 minutes, no pain but fatigue, rest for a minute and continue. 2/15 pt had to stop 3 times while vacuuming and rest for a minute, reported discomfort with activity. Biggest complaint of numbness; 12/14/20: Pt reports that her DOE is more limiting than her back pain at this time with respect to vacuuming. 01/25/21: DOE has been improving and she has been able vaccuum however she deals with significant fatigue and pain afterward;    Time 8    Period Weeks    Status Achieved      PT LONG TERM GOAL #3   Title Pt. will improve FOTO to 50 to demonstrate improvement in pain free functional mobility.    Baseline 11/16: 35; 08/03/20: 63; 01/25/21: 48    Time 4    Period Weeks    Status Partially Met    Target Date 02/22/21      PT LONG TERM GOAL #4   Title Pt. independent with HEP to increase B hip strength 1/2 muscle grade to improve standing tolerance/ pain-free household tasks.    Baseline Pt. demonstrates 5/5 strength bilaterally, except for bilat hip IR, ER, and flexion which scored 4/5 for bilat for each. 08/03/20: R/L Hip flexion: 4+/4+, hip extension: 4/4, hip abduction: 4+/4+, hip adduction: 4+/4; deferred to next session; 12/14/20: Hip strength: R/L hip flexion: 4+/4+, hip extension: 4/4, hip abduction: 4+/4+, hip adduction: 4+/4+    Time 8    Period Weeks    Status Achieved      PT LONG TERM GOAL #5   Title Patient will report and demonstrate the ability to perform finalized HEP independently, as well as an understanding on how to progress program to continue to maximize strength and function.    Time 8    Period Weeks    Status Achieved                   Plan - 02/08/21 1358     Clinical Impression Statement Session focused on  combination of strengthening and manual techniques to decrease low back pain and improve tissue extensibility. Patient is able to complete all exercises as instructed without increase in her pain. She reports significant relief from hip  and low back stretches so continued during session today. Pt educated on importance of home program and plans to purchase a Hypervolt so her husband can assist her at home with soft tissue mobilization. Encouraged patient to continue her HEP and follow-up as scheduled. Pt will benefit from additional PT services to address deficits in back pain and improve pain-free function at home.    Personal Factors and Comorbidities Fitness;Past/Current Experience;Time since onset of injury/illness/exacerbation    Examination-Activity Limitations Bed Mobility;Lift;Stairs;Squat;Stand;Transfers;Sit    Examination-Participation Restrictions Community Activity;Yard Work    Stability/Clinical Decision Making Stable/Uncomplicated    Rehab Potential Fair    PT Frequency 2x / week    PT Duration 8 weeks    PT Treatment/Interventions ADLs/Self Care Home Management;Aquatic Therapy;Cryotherapy;Gait training;Stair training;Moist Heat;Functional mobility training;Therapeutic activities;Therapeutic exercise;Balance training;Neuromuscular re-education;Patient/family education;Manual techniques;Dry needling;Electrical Stimulation;Spinal Manipulations;Joint Manipulations    PT Next Visit Plan prone manual tx, adominal and low back strengthening;    PT Home Exercise Plan Medbridge Access Code: [email or web address]    Consulted and Agree with Plan of Care Patient             Patient will benefit from skilled therapeutic intervention in order to improve the following deficits and impairments:  Abnormal gait, Decreased activity tolerance, Decreased endurance, Decreased mobility, Decreased range of motion, Hypomobility, Difficulty walking, Decreased strength, Increased muscle spasms, Impaired perceived  functional ability, Pain  Visit Diagnosis: Chronic bilateral low back pain, unspecified whether sciatica present  Decreased strength     Problem List Patient Active Problem List   Diagnosis Date Noted   Centrilobular emphysema ([place]) 12/30/2020   Lung nodule 12/30/2020   Skin mole 12/21/2020   History of depression 12/21/2020   SVT (supraventricular tachycardia) ([place]) 11/07/2020   Counseling on health promotion and disease prevention 10/24/2020   Palpitations 08/25/2020   DOE (dyspnea on exertion) 08/25/2020   Mixed hyperlipidemia 08/25/2020   History of palpitations 07/05/2020   Shortness of breath 07/05/2020   Chronic bilateral low back pain without sciatica 06/06/2020   Lumbar facet arthropathy 06/06/2020   Myofascial pain 06/06/2020   Chronic pain syndrome 06/06/2020   Cannabis use disorder, mild, abuse 06/06/2020   Fibromyalgia 06/06/2020   Elevated lipids 03/09/2020   Encounter to establish care 02/22/2020   Chronic abdominal pain 02/22/2020   Hot flashes 02/22/2020   Chronic back pain 02/22/2020   Anxiety 02/22/2020   Lyndel Safe Trenden Hazelrigg PT, DPT, GCS  Amber Livingston 02/08/2021, 3:23 PM  [facility] [address] [place], [place], [address] Phone: [phone]   Fax:  [phone]  Name: Amber Livingston MRN: [id number] Date of Birth: 02-28-1961

## 2021-02-13 ENCOUNTER — Ambulatory Visit: Payer: Self-pay | Admitting: Licensed Clinical Social Worker

## 2021-02-13 ENCOUNTER — Ambulatory Visit: Payer: Medicaid Other | Admitting: Physical Therapy

## 2021-02-13 ENCOUNTER — Other Ambulatory Visit: Payer: Self-pay

## 2021-02-13 ENCOUNTER — Telehealth: Payer: Self-pay | Admitting: Licensed Clinical Social Worker

## 2021-02-13 DIAGNOSIS — R2689 Other abnormalities of gait and mobility: Secondary | ICD-10-CM

## 2021-02-13 DIAGNOSIS — M545 Low back pain, unspecified: Secondary | ICD-10-CM | POA: Diagnosis not present

## 2021-02-13 DIAGNOSIS — G8929 Other chronic pain: Secondary | ICD-10-CM

## 2021-02-13 DIAGNOSIS — R531 Weakness: Secondary | ICD-10-CM

## 2021-02-13 NOTE — Therapy (Signed)
[facility] [address] [place], [place], [address] Phone: [phone]   Fax:  [phone]  Physical Therapy Treatment  Patient Details  Name: Amber Livingston MRN: [id number] Date of Birth: 10-02-1960 Referring Provider (PT): Dr. Gillis Santa   Encounter Date: 02/13/2021   PT End of Session - 02/13/21 1245     Visit Number 44    Number of Visits 59    Date for PT Re-Evaluation 02/22/21    PT Start Time 6962    PT Stop Time 1230    PT Time Calculation (min) 45 min    Activity Tolerance Patient tolerated treatment well    Behavior During Therapy [facility] for tasks assessed/performed             Past Medical History:  Diagnosis Date   Arthritis    Cervical spinal stenosis    Emphysema, unspecified ([place])    Fibromyalgia    Stage 7   Lumbar stenosis    Uterine fibroid     Past Surgical History:  Procedure Laterality Date   ablation     APPENDECTOMY     DILATION AND CURETTAGE OF UTERUS     x3 for miscarriage   LAPAROSCOPY     Fibroid removal   PALPITATION     TONSILLECTOMY      There were no vitals filed for this visit.   Subjective Assessment - 02/13/21 1152     Subjective Pt states she is "on her way" and "moving forward" since starting Cymbalta last Thursday. Denies pain. Her [employer] arrived this week and she is very excited about it. She plans to start yoga upin completion of PT. No specific questions upon arrival.    Pertinent History fibromyalgia    Patient Stated Goals return to work, improve strength and mobility, decrease pain    Currently in Pain? No/denies    Pain Onset More than a month ago               TREATMENT     Manual Therapy  Moist heat to lumbar spine in prone during history; STM to lumbar region bilateral paraspinals, QL, and hips with percussive and vibratory technique using Theragun; Grade I-II CPA grade mobs L1-L5, 30s/bout x 2 bouts/level; Grade I-II UPA grade mobs L1-L5  bilateral, 30s/bout x 2 bouts/level; Supine FABER and FADIR stretches x 45s bilateral; Supine thoracolumbar rotation stretches x 45s bilateral;     Ther-ex  Prone alternating hip extension x 10 BLE; Prone HS curls x 10 BLE; Hooklying lumbar rocking x 1 minute; Hooklying bridges 2 x 10; Hooklying alternating marches x 10 BLE; Hooklying SLR 2 x 10 BLE; Hooklying clams with manual resistance x 10; Hooklying adductor squeezes with manual resistance x 10; Quadruped cat/cow x 10 each; Sidelying quad stretch x 30s each;  Pt educated throughout session about proper posture and technique with exercises. Improved exercise technique, movement at target joints, use of target muscles after min to mod verbal, visual, tactile cues.       Session focused on combination of strengthening and manual techniques to improve tissue extensibility and promote increased core strength/stability. Patient is able to  complete all exercises as instructed without increase in her pain. She reports slight increase in spinal stiffness after lying prone for 15 minutes. Pt educated on importance of home program. Pt educated on an additional LE stretch after she reported a feeling of tightness in hip flexors. Encouraged patient to continue her HEP and follow-up as scheduled. Reminded pt that Thursday is her final PT session. Pt will benefit from additional PT services to address deficits in back pain and improve pain-free function at home.          PT Long Term Goals - 01/25/21 1322       PT LONG TERM GOAL #1   Title Pt. will perform 5xSTS in 12 seconds or less to demonstrate improvements in LE strength and functional mobility.    Baseline 11/16: 19.4s; 08/03/20: 11.5s    Time 8    Period Weeks    Status Achieved      PT LONG TERM GOAL #2   Title Pt. will report ability to vaccuum for greater than 10 mins with no increase in back pain over 2/10 to improve pain free functional mobility.    Baseline 11/16: pt is currently not able to vaccuum for 4-5 minutes with back pain at 5/10; 08/03/20: Pt can vaccuum for 4-5 minutes, no pain but fatigue, rest for a minute and continue. 2/15 pt had to stop 3 times while vacuuming and rest for a minute, reported discomfort with activity. Biggest complaint of numbness; 12/14/20: Pt reports that her DOE is more limiting than her back pain at this time with respect to vacuuming. 01/25/21: DOE has been improving and she has been able vaccuum however she deals with significant fatigue and pain afterward;    Time 8    Period Weeks    Status Achieved      PT LONG TERM GOAL #3   Title Pt. will improve FOTO to 50 to demonstrate improvement in pain free functional mobility.    Baseline 11/16: 35; 08/03/20: 63; 01/25/21: 48    Time 4    Period Weeks    Status Partially Met    Target Date 02/22/21      PT LONG TERM GOAL #4   Title Pt. independent with HEP to increase B hip strength 1/2  muscle grade to improve standing tolerance/ pain-free household tasks.    Baseline Pt. demonstrates 5/5 strength bilaterally, except for bilat hip IR, ER, and flexion which scored 4/5 for bilat for each. 08/03/20: R/L Hip flexion: 4+/4+, hip extension: 4/4, hip abduction: 4+/4+, hip adduction: 4+/4; deferred to next session; 12/14/20: Hip strength: R/L hip flexion: 4+/4+, hip extension: 4/4, hip abduction: 4+/4+, hip adduction: 4+/4+    Time 8    Period Weeks    Status Achieved      PT LONG TERM GOAL #5   Title Patient will report and demonstrate the ability to perform finalized HEP independently, as well as an understanding on how to progress program to continue to maximize strength and function.    Time 8    Period Weeks    Status Achieved  Plan - 02/13/21 1246     Personal Factors and Comorbidities Fitness;Past/Current Experience;Time since onset of injury/illness/exacerbation    Examination-Activity Limitations Bed Mobility;Lift;Stairs;Squat;Stand;Transfers;Sit    Examination-Participation Restrictions Community Activity;Yard Work    Stability/Clinical Decision Making Stable/Uncomplicated    Rehab Potential Fair    PT Frequency 2x / week    PT Duration 8 weeks    PT Treatment/Interventions ADLs/Self Care Home Management;Aquatic Therapy;Cryotherapy;Gait training;Stair training;Moist Heat;Functional mobility training;Therapeutic activities;Therapeutic exercise;Balance training;Neuromuscular re-education;Patient/family education;Manual techniques;Dry needling;Electrical Stimulation;Spinal Manipulations;Joint Manipulations    PT Next Visit Plan prone manual tx, adominal and low back strengthening;    PT Home Exercise Plan Medbridge Access Code: [email or web address]    Consulted and Agree with Plan of Care Patient             Patient will benefit from skilled therapeutic intervention in order to improve the following deficits and impairments:  Abnormal gait, Decreased  activity tolerance, Decreased endurance, Decreased mobility, Decreased range of motion, Hypomobility, Difficulty walking, Decreased strength, Increased muscle spasms, Impaired perceived functional ability, Pain  Visit Diagnosis: Chronic bilateral low back pain, unspecified whether sciatica present  Decreased strength  Decreased mobility     Problem List Patient Active Problem List   Diagnosis Date Noted   Centrilobular emphysema ([place]) 12/30/2020   Lung nodule 12/30/2020   Skin mole 12/21/2020   History of depression 12/21/2020   SVT (supraventricular tachycardia) ([place]) 11/07/2020   Counseling on health promotion and disease prevention 10/24/2020   Palpitations 08/25/2020   DOE (dyspnea on exertion) 08/25/2020   Mixed hyperlipidemia 08/25/2020   History of palpitations 07/05/2020   Shortness of breath 07/05/2020   Chronic bilateral low back pain without sciatica 06/06/2020   Lumbar facet arthropathy 06/06/2020   Myofascial pain 06/06/2020   Chronic pain syndrome 06/06/2020   Cannabis use disorder, mild, abuse 06/06/2020   Fibromyalgia 06/06/2020   Elevated lipids 03/09/2020   Encounter to establish care 02/22/2020   Chronic abdominal pain 02/22/2020   Hot flashes 02/22/2020   Chronic back pain 02/22/2020   Anxiety 02/22/2020   Patrina Levering PT, DPT  [facility] 02/13/2021, 1:11 PM   [facility] [address] [place], [place], [address] Phone: [phone]   Fax:  [phone]  Name: [name] MRN: [id number] Date of Birth: 08-16-1961

## 2021-02-13 NOTE — Telephone Encounter (Signed)
Called the patient twice during today's scheduled appointment; no answer, left a voicemail with the clinic contact information so they may reschedule.   

## 2021-02-14 ENCOUNTER — Other Ambulatory Visit: Payer: Self-pay

## 2021-02-14 ENCOUNTER — Ambulatory Visit
Admission: RE | Admit: 2021-02-14 | Discharge: 2021-02-14 | Disposition: A | Discharge status: Home / Self Care | Payer: No Typology Code available for payment source | Source: Ambulatory Visit | Attending: Oncology | Admitting: Oncology

## 2021-02-14 ENCOUNTER — Ambulatory Visit: Payer: No Typology Code available for payment source | Attending: Oncology

## 2021-02-14 VITALS — BP 117/85 | HR 72 | Temp 97.4°F | Ht 69.0 in | Wt 120.4 lb

## 2021-02-14 DIAGNOSIS — Z0000 Encounter for general adult medical examination without abnormal findings: Secondary | ICD-10-CM

## 2021-02-14 NOTE — Progress Notes (Signed)
  Subjective:     Patient ID: Amber Livingston, female   DOB: [birth year], 60 y.o.   MRN: [id number]  HPI   Review of Systems     Objective:   Physical Exam Chest:  Breasts:    Right: No swelling, bleeding, inverted nipple, mass, nipple discharge, skin change or tenderness.     Left: No swelling, bleeding, inverted nipple, mass, nipple discharge, skin change or tenderness.       Assessment:     60 year old patient presents for [facility] visit.  Reports her last mammogram was in [facility].  States she has had significant weight loss since that time. Patient screened, and meets BCCCP eligibility.  Patient does not have insurance, Medicare or Medicaid.  Instructed patient on breast self awareness using teach back method.   Clinical breast exam unremarkable. No mass or lump palpated.  Risk Assessment     Risk Scores       02/14/2021   Last edited by: Theodore Demark, RN   5-year risk: 1.6 %   Lifetime risk: 8.1 %               Plan:     Sent for bilateral screening mammogram.

## 2021-02-15 ENCOUNTER — Other Ambulatory Visit: Payer: Self-pay

## 2021-02-15 ENCOUNTER — Ambulatory Visit: Payer: Medicaid Other | Admitting: Physical Therapy

## 2021-02-15 ENCOUNTER — Telehealth: Payer: Self-pay

## 2021-02-15 DIAGNOSIS — R2689 Other abnormalities of gait and mobility: Secondary | ICD-10-CM

## 2021-02-15 DIAGNOSIS — M545 Low back pain, unspecified: Secondary | ICD-10-CM | POA: Diagnosis not present

## 2021-02-15 DIAGNOSIS — N63 Unspecified lump in unspecified breast: Secondary | ICD-10-CM

## 2021-02-15 DIAGNOSIS — R531 Weakness: Secondary | ICD-10-CM

## 2021-02-15 DIAGNOSIS — G8929 Other chronic pain: Secondary | ICD-10-CM

## 2021-02-15 NOTE — Therapy (Signed)
[facility] [address] [place], [place], [address] Phone: [phone]   Fax:  [phone]  Physical Therapy Treatment/Discharge Summary 07/04/21 - 02/15/21 Patient Details  Name: Amber Livingston MRN: [id number] Date of Birth: Feb 11, 1961 Referring Provider (PT): Dr. Gillis Santa   Encounter Date: 02/15/2021   PT End of Session - 02/15/21 1427     Visit Number 45    Number of Visits 57    Date for PT Re-Evaluation 02/22/21    PT Start Time 1315    PT Stop Time 1400    PT Time Calculation (min) 45 min    Activity Tolerance Patient tolerated treatment well    Behavior During Therapy [facility] for tasks assessed/performed             Past Medical History:  Diagnosis Date   Arthritis    Cervical spinal stenosis    Emphysema, unspecified ([place])    Fibromyalgia    Stage 7   Lumbar stenosis    Uterine fibroid     Past Surgical History:  Procedure Laterality Date   ablation     APPENDECTOMY     DILATION AND CURETTAGE OF UTERUS     x3 for miscarriage   LAPAROSCOPY     Fibroid removal   PALPITATION     TONSILLECTOMY      There were no vitals filed for this visit.   Subjective Assessment - 02/15/21 1320     Subjective Pt states she had a 5/10 pain yesterday and 1/10 pain today. She is still taking Cymbalta. States she spent the entire day yesterday in bed, was not able to relieve the pain. She has numbess throughout her LLE. She does complete her HEP "when possible" when flare up is not present. No specific questions upon arrival.    Pertinent History fibromyalgia    Patient Stated Goals return to work, improve strength and mobility, decrease pain    Currently in Pain? Yes    Pain Score 1     Pain Location Back    Pain Orientation Lower    Pain Descriptors / Indicators Aching;Tightness    Pain Type Chronic pain    Pain Onset More than a month ago               TREATMENT  Outcome Measures FOTO: 34/50    Manual  Therapy  Moist heat to lumbar spine in prone during history; STM to lumbar region bilateral paraspinals, QL, and hips with percussive and vibratory technique using Theragun; Grade I-II CPA grade mobs L1-L5, 30s/bout x 2 bouts/level; Grade I-II UPA grade mobs L1-L5 bilateral, 30s/bout x 2 bouts/level; Supine FABER and FADIR stretches x 45s bilateral; Supine thoracolumbar rotation stretches x 45s bilateral;     Ther-ex  Prone alternating hip extension x 10 BLE; Prone HS curls x 10 BLE; Hooklying lumbar rocking x 1 minute; Hooklying bridges 2 x 10; Hooklying alternating marches x 10 BLE; Hooklying SLR 2 x 10 BLE; Sidelying quad stretch x 45s each;  Pt educated throughout session about proper posture and technique with exercises. Improved exercise technique, movement at target joints, use of target muscles after min to mod verbal, visual, tactile cues.        Today was patient's final PT session. She ambulated with a limp as we walked back to the treatment room. Limp was no longer present at end of session. Time was spent discussing POC after therapy and independence with HEP/some manual techniques. PT also advised on specific yoga poses she should beware of and specific modifications that can be taken. Foto was assessed - pt scored 34/50 contributing to a downward trend in scores since the spring. Possible skew of FOTO score due to "flare-up" that began yesterday. Patient achieved 4 out of 5 goals representing an overall improvement in functional tasks and strength. Today's session was a continuation of strengthening and manual techniques to improve tissue extensibility and promote increased core  strength/stability. Patient is able to complete all exercises as instructed without increase in her pain. Pt educated on importance of continuing HEP once discharged. She has been educated on manual and therex techniques that would be beneficial in maintaining the progress that has been made over the past 45 PT visits. Pt no longer requires skilled PT services.         PT Long Term Goals - 02/15/21 1446       PT LONG TERM GOAL #1   Title Pt. will perform 5xSTS in 12 seconds or less to demonstrate improvements in LE strength and functional mobility.    Baseline 11/16: 19.4s; 08/03/20: 11.5s    Time 8    Period Weeks    Status Achieved      PT LONG TERM GOAL #2   Title Pt. will report ability to vaccuum for greater than 10 mins with no increase in back pain over 2/10 to improve pain free functional mobility.    Baseline 11/16: pt is currently not able to vaccuum for 4-5 minutes with back pain at 5/10; 08/03/20: Pt can vaccuum for 4-5 minutes, no pain but fatigue, rest for a minute and continue. 2/15 pt had to stop 3 times while vacuuming and rest for a minute, reported discomfort with activity. Biggest complaint of numbness; 12/14/20: Pt reports that her DOE is more limiting than her back pain at this time with respect to vacuuming. 01/25/21: DOE has been improving and she has been able vaccuum however she deals with significant fatigue and pain afterward;    Time 8    Period Weeks    Status Achieved      PT LONG TERM GOAL #3   Title Pt. will improve FOTO to 50 to demonstrate improvement in pain free functional mobility.    Baseline 11/16: 35; 08/03/20: 63; 01/25/21: 48; 02/15/21: 34    Time 4    Period Weeks    Status Not Met      PT LONG TERM GOAL #4   Title Pt. independent with HEP to increase B hip strength 1/2 muscle grade to improve standing tolerance/ pain-free household tasks.    Baseline Pt. demonstrates 5/5 strength bilaterally, except for bilat hip IR, ER, and flexion which scored  4/5 for bilat for each. 08/03/20: R/L Hip flexion: 4+/4+, hip extension: 4/4, hip abduction: 4+/4+, hip adduction: 4+/4; deferred to next session; 12/14/20: Hip strength: R/L hip flexion: 4+/4+, hip extension: 4/4, hip abduction: 4+/4+, hip adduction: 4+/4+    Time 8    Period Weeks    Status Achieved  PT LONG TERM GOAL #5   Title Patient will report and demonstrate the ability to perform finalized HEP independently, as well as an understanding on how to progress program to continue to maximize strength and function.    Time 8    Period Weeks    Status Achieved                   Plan - 02/15/21 1427     Clinical Impression Statement Today was patient's final PT session. She ambulated with a limp as we walked back to the treatment room. Limp was no longer present at end of session. Time was spent discussing POC after therapy and independence with HEP/some manual techniques. PT also advised on specific yoga poses she should beware of and specific modifications that can be taken. Foto was assessed - pt scored 34/50 contributing to a downward trend in scores since the spring. Possible skew of FOTO score due to "flare-up" that began yesterday. Patient achieved 4 out of 5 goals representing an overall improvement in functional tasks and strength. Today's session was a continuation of strengthening and manual techniques to improve tissue extensibility and promote increased core strength/stability. Patient is able to complete all exercises as instructed without increase in her pain. Pt educated on importance of continuing HEP once discharged. She has been educated on manual and therex techniques that would be beneficial in maintaining the progress that has been made over the past 45 PT visits. Pt no longer requires skilled PT services.    Personal Factors and Comorbidities Fitness;Past/Current Experience;Time since onset of injury/illness/exacerbation    Examination-Activity Limitations Bed  Mobility;Lift;Stairs;Squat;Stand;Transfers;Sit    Examination-Participation Restrictions Community Activity;Yard Work    Stability/Clinical Decision Making Stable/Uncomplicated    Rehab Potential Fair    PT Frequency 2x / week    PT Duration 8 weeks    PT Treatment/Interventions ADLs/Self Care Home Management;Aquatic Therapy;Cryotherapy;Gait training;Stair training;Moist Heat;Functional mobility training;Therapeutic activities;Therapeutic exercise;Balance training;Neuromuscular re-education;Patient/family education;Manual techniques;Dry needling;Electrical Stimulation;Spinal Manipulations;Joint Manipulations    PT Next Visit Plan prone manual tx, adominal and low back strengthening;    PT Home Exercise Plan Medbridge Access Code: [email or web address]    Consulted and Agree with Plan of Care Patient             Patient will benefit from skilled therapeutic intervention in order to improve the following deficits and impairments:  Abnormal gait, Decreased activity tolerance, Decreased endurance, Decreased mobility, Decreased range of motion, Hypomobility, Difficulty walking, Decreased strength, Increased muscle spasms, Impaired perceived functional ability, Pain  Visit Diagnosis: Chronic bilateral low back pain, unspecified whether sciatica present  Decreased strength  Decreased mobility     Problem List Patient Active Problem List   Diagnosis Date Noted   Centrilobular emphysema ([place]) 12/30/2020   Lung nodule 12/30/2020   Skin mole 12/21/2020   History of depression 12/21/2020   SVT (supraventricular tachycardia) ([place]) 11/07/2020   Counseling on health promotion and disease prevention 10/24/2020   Palpitations 08/25/2020   DOE (dyspnea on exertion) 08/25/2020   Mixed hyperlipidemia 08/25/2020   History of palpitations 07/05/2020   Shortness of breath 07/05/2020   Chronic bilateral low back pain without sciatica 06/06/2020   Lumbar facet arthropathy 06/06/2020   Myofascial pain  06/06/2020   Chronic pain syndrome 06/06/2020   Cannabis use disorder, mild, abuse 06/06/2020   Fibromyalgia 06/06/2020   Elevated lipids 03/09/2020   Encounter to establish care 02/22/2020   Chronic abdominal pain 02/22/2020   Hot flashes 02/22/2020   Chronic  back pain 02/22/2020   Anxiety 02/22/2020   Patrina Levering PT, DPT  [facility] 02/15/2021, 2:55 PM  [facility] [address] [place], [place], [address] Phone: [phone]   Fax:  [phone]  Name: Amber Livingston MRN: [id number] Date of Birth: [birth year]

## 2021-02-16 NOTE — Telephone Encounter (Signed)
Opened in error

## 2021-02-20 ENCOUNTER — Encounter: Payer: Self-pay | Admitting: Obstetrics and Gynecology

## 2021-02-22 ENCOUNTER — Ambulatory Visit
Admission: RE | Admit: 2021-02-22 | Discharge: 2021-02-22 | Disposition: A | Discharge status: Home / Self Care | Payer: Self-pay | Source: Ambulatory Visit | Attending: Oncology | Admitting: Oncology

## 2021-02-22 ENCOUNTER — Ambulatory Visit: Payer: Self-pay | Admitting: Gerontology

## 2021-02-22 ENCOUNTER — Other Ambulatory Visit: Payer: Self-pay

## 2021-02-22 ENCOUNTER — Encounter: Payer: Self-pay | Admitting: Gerontology

## 2021-02-22 VITALS — BP 109/70 | HR 68 | Temp 97.9°F | Resp 16 | Ht 67.0 in | Wt 121.4 lb

## 2021-02-22 DIAGNOSIS — N63 Unspecified lump in unspecified breast: Secondary | ICD-10-CM

## 2021-02-22 DIAGNOSIS — F341 Dysthymic disorder: Secondary | ICD-10-CM

## 2021-02-22 DIAGNOSIS — R0609 Other forms of dyspnea: Secondary | ICD-10-CM

## 2021-02-22 DIAGNOSIS — R0600 Dyspnea, unspecified: Secondary | ICD-10-CM

## 2021-02-22 MED ORDER — TRAZODONE HCL 50 MG PO TABS
ORAL_TABLET | ORAL | 0 refills | Status: DC
  Filled 2021-02-22: qty 15, 30d supply, fill #0
  Filled 2021-03-25: qty 15, 30d supply, fill #1

## 2021-02-22 MED ORDER — SPIRIVA RESPIMAT 2.5 MCG/ACT IN AERS: 2.0000 | INHALATION_SPRAY | Freq: Every day | RESPIRATORY_TRACT | 4 refills | Status: DC

## 2021-02-22 NOTE — Progress Notes (Signed)
Established Patient Office Visit  Subjective:  Patient ID: Amber Livingston, female    DOB: [birth year]  Age: 60 y.o. MRN: [id number]  CC:  Chief Complaint  Patient presents with   Follow-up    HPI Amber Livingston is a 60 y/o female who has a history of Arthritis, Cervical Spinal Stenosis, Fibromyalgia, Uterine Fibroid and Depression,presents for follow up visit. She was seen by the Pulmonologist on 01/17/21 by Dr Arnoldo Lenis C.J. with regards to her Emphysema. She will continue on Spiriva, Albuterol and follow up with Chest CT scan in 6 months. Currently, she states that her breathing is much better , but continues to experience intermittent shortness of breath with exertion. She denies cough, wheezing, and chest tightness. She was seen at the Pain clinic by Dr Gillis Santa on 02/08/21 and it was recommended for her to continue exercising at home, might titrate Cymbalta to 60 mg as tolerated, Magnesium and NSAID as needed. She had digital diagnostic bilateral mammogram with Tomosynthesis and CAD Ultrasound right and left breast limited today 02/22/21 and it showed Right breast cyst. Left breast complicated cyst. No mammographic evidence for malignancy, she will follow up with screening mammogram in one year. She states that her mood is good, denies suicidal nor homicidal ideation and will follow up with [facility]al team. She states that she applied for DSS, states that she's doing well and offers no further complaint.    Past Medical History:  Diagnosis Date   Arthritis    Cervical spinal stenosis    Emphysema, unspecified (HCC)    Fibromyalgia    Stage 7   Lumbar stenosis    Uterine fibroid     Past Surgical History:  Procedure Laterality Date   ablation     APPENDECTOMY     DILATION AND CURETTAGE OF UTERUS     x3 for miscarriage   LAPAROSCOPY     Fibroid removal   PALPITATION     TONSILLECTOMY      Family History  Problem Relation Age of Onset   Uterine cancer Mother 53   Lung cancer  Mother    Brain cancer Mother    Hypertension Mother    Alcohol abuse Maternal Grandmother    Breast cancer Paternal Grandmother 46   Dementia Father    Prostate cancer Father    Dementia Paternal Uncle    Dementia Paternal Grandfather     Social History   Socioeconomic History   Marital status: Married    Spouse name: Not on file   Number of children: Not on file   Years of education: Not on file   Highest education level: Not on file  Occupational History   Not on file  Tobacco Use   Smoking status: Former    Packs/day: 1.00    Years: 46.00    Pack years: 46.00    Types: Cigarettes    Quit date: 10/04/2020    Years since quitting: 0.3   Smokeless tobacco: Never   Tobacco comments:    verified 01/17/2021  Vaping Use   Vaping Use: Never used  Substance and Sexual Activity   Alcohol use: Not Currently   Drug use: Not Currently    Frequency: 7.0 times per week    Types: Marijuana    Comment: once daily, last use 10/2020   Sexual activity: Not Currently    Partners: Male  Other Topics Concern   Not on file  Social History Narrative   Not on file   Social  Determinants of Health   Financial Resource Strain: High Risk   Difficulty of Paying Living Expenses: Very hard  Food Insecurity: No Food Insecurity   Worried About Running Out of Food in the Last Year: Never true   Ran Out of Food in the Last Year: Never true  Transportation Needs: No Transportation Needs   Lack of Transportation (Medical): No   Lack of Transportation (Non-Medical): No  Physical Activity: Inactive   Days of Exercise per Week: 0 days   Minutes of Exercise per Session: 0 min  Stress: Stress Concern Present   Feeling of Stress : To some extent  Social Connections: Socially Isolated   Frequency of Communication with Friends and Family: Twice a week   Frequency of Social Gatherings with Friends and Family: Never   Attends Religious Services: Never   Marine scientist or Organizations: No    Attends Music therapist: Never   Marital Status: Never married  Human resources officer Violence: Not on file    Outpatient Medications Prior to Visit  Medication Sig Dispense Refill   BEE POLLEN PO Take 1 capsule by mouth daily.     Cyanocobalamin (B-12) 2500 MCG TABS Take by mouth daily.     D-Ribose (RIBOSE, D,) POWD 2,500 mg by Does not apply route in the morning and at bedtime.     diphenhydramine-acetaminophen (TYLENOL PM) 25-500 MG TABS tablet Take 2 tablets by mouth at bedtime as needed.     DULoxetine (CYMBALTA) 30 MG capsule Take 1 capsule (30 mg total) by mouth once daily. 30 capsule 0   estradiol (ESTRACE) 0.5 MG tablet Take 1 tablet (0.5 mg total) by mouth daily. 30 tablet 0   ibuprofen (ADVIL) 200 MG tablet Take 600 mg by mouth every 6 (six) hours as needed for moderate pain.     MAGNESIUM BISGLYCINATE PO Take 1,000 mg by mouth daily.     medroxyPROGESTERone (PROVERA) 2.5 MG tablet Take 1 tablet (2.5 mg total) by mouth daily. 30 tablet 0   nicotine polacrilex (COMMIT) 4 MG lozenge Take 4 mg by mouth as needed for smoking cessation.     NON FORMULARY as needed. CBD gummies     PROAIR HFA 108 (90 Base) MCG/ACT inhaler INHALE 2 PUFFS BY MOUTH EVERY 6 HOURS AS NEEDED FOR WHEEZING OR SHORT OF BREATH 34 g 11   Turmeric (QC TUMERIC COMPLEX PO) Take 1,000 mg by mouth daily.     Tiotropium Bromide Monohydrate (SPIRIVA RESPIMAT) 2.5 MCG/ACT AERS Inhale 2 puffs into the lungs daily. 4 g 0   traZODone (DESYREL) 50 MG tablet TAKE 1/2 TABLET(25MG  TOTAL) BY MOUTH AT BEDTIME 30 tablet 0   No facility-administered medications prior to visit.    No Known Allergies  ROS Review of Systems  Constitutional: Negative.   Respiratory:  Positive for shortness of breath (with exertion).   Cardiovascular: Negative.   Skin: Negative.   Neurological: Negative.   Psychiatric/Behavioral: Negative.       Objective:    Physical Exam HENT:     Head: Normocephalic and atraumatic.   Cardiovascular:     Rate and Rhythm: Normal rate and regular rhythm.     Pulses: Normal pulses.     Heart sounds: Normal heart sounds.  Pulmonary:     Effort: Pulmonary effort is normal.     Breath sounds: Normal breath sounds.  Skin:    General: Skin is warm.  Neurological:     General: No focal deficit present.  Mental Status: She is alert and oriented to person, place, and time. Mental status is at baseline.  Psychiatric:        Mood and Affect: Mood normal.        Behavior: Behavior normal.        Thought Content: Thought content normal.        Judgment: Judgment normal.    BP 109/70 (BP Location: Right Arm, Patient Position: Sitting, Cuff Size: Normal)   Pulse 68   Temp 97.9 F (36.6 C)   Resp 16   Ht 5\' 7"  (1.702 m)   Wt 121 lb 6.4 oz (55.1 kg)   SpO2 96%   BMI 19.01 kg/m  Wt Readings from Last 3 Encounters:  02/22/21 121 lb 6.4 oz (55.1 kg)  02/14/21 120 lb 6.4 oz (54.6 kg)  02/08/21 117 lb (53.1 kg)     Health Maintenance Due  Topic Date Due   Pneumococcal Vaccine 98-23 Years old (1 - PCV) Never done   HIV Screening  Never done   Hepatitis C Screening  Never done   TETANUS/TDAP  Never done   Zoster Vaccines- Shingrix (1 of 2) Never done   COVID-19 Vaccine (3 - Moderna risk series) 01/13/2020    There are no preventive care reminders to display for this patient.  Lab Results  Component Value Date   TSH 0.863 03/02/2020   Lab Results  Component Value Date   WBC 4.2 09/27/2020   HGB 12.7 09/27/2020   HCT 37.5 09/27/2020   MCV 94 09/27/2020   PLT 321 09/27/2020   Lab Results  Component Value Date   NA 141 09/27/2020   K 5.0 09/27/2020   CO2 21 09/27/2020   GLUCOSE 88 09/27/2020   BUN 11 09/27/2020   CREATININE 0.75 09/27/2020   BILITOT 0.5 03/02/2020   ALKPHOS 72 03/02/2020   AST 14 03/02/2020   ALT 23 03/02/2020   PROT 7.0 03/02/2020   ALBUMIN 4.7 03/02/2020   CALCIUM 9.7 09/27/2020   Lab Results  Component Value Date   CHOL 208  (H) 07/05/2020   Lab Results  Component Value Date   HDL 60 07/05/2020   Lab Results  Component Value Date   LDLCALC 132 (H) 07/05/2020   Lab Results  Component Value Date   TRIG 89 07/05/2020   Lab Results  Component Value Date   CHOLHDL 3.5 07/05/2020   No results found for: HGBA1C    Assessment & Plan:   1. Persistent depressive disorder with anxious distress, currently severe -She will continue on current medication, was advised to call Crisis help line with worsening symptoms. She will follow up with [facility] Behavioral health Ms. Jerrilyn Cairo. - traZODone (DESYREL) 50 MG tablet; TAKE 1/2 TABLET (25MG  TOTAL) BY MOUTH AT BEDTIME  Dispense: 30 tablet; Refill: 0  2. DOE (dyspnea on exertion) -She will continue on current medication, advised to notify clinic for worsening symptoms and go to the ED. - Tiotropium Bromide Monohydrate (SPIRIVA RESPIMAT) 2.5 MCG/ACT AERS; Inhale 2 puffs into the lungs daily.  Dispense: 4 g; Refill: 4     Follow-up: Return in about 22 weeks (around 07/26/2021), or if symptoms worsen or fail to improve.    Sarita Hakanson Jerold Coombe, NP

## 2021-02-22 NOTE — Progress Notes (Signed)
Letter mailed from [facility] to notify of normal mammogram results.  Patient to return in one year for annual screening.  Copy to [facility]. 

## 2021-02-23 ENCOUNTER — Other Ambulatory Visit: Payer: Self-pay

## 2021-02-23 ENCOUNTER — Other Ambulatory Visit (HOSPITAL_COMMUNITY)
Admission: RE | Admit: 2021-02-23 | Discharge: 2021-02-23 | Disposition: A | Discharge status: Home / Self Care | Payer: Medicaid Other | Source: Ambulatory Visit | Attending: Pulmonary Disease | Admitting: Pulmonary Disease

## 2021-02-23 DIAGNOSIS — Z01812 Encounter for preprocedural laboratory examination: Secondary | ICD-10-CM | POA: Diagnosis present

## 2021-02-23 DIAGNOSIS — Z20822 Contact with and (suspected) exposure to covid-19: Secondary | ICD-10-CM | POA: Diagnosis not present

## 2021-02-23 LAB — SARS CORONAVIRUS 2 (TAT 6-24 HRS): SARS Coronavirus 2: NEGATIVE

## 2021-02-27 ENCOUNTER — Ambulatory Visit (INDEPENDENT_AMBULATORY_CARE_PROVIDER_SITE_OTHER): Payer: Self-pay | Admitting: Pulmonary Disease

## 2021-02-27 ENCOUNTER — Other Ambulatory Visit: Payer: Self-pay

## 2021-02-27 VITALS — BP 110/70 | HR 75 | Temp 97.7°F | Ht 67.0 in | Wt 121.0 lb

## 2021-02-27 DIAGNOSIS — R0602 Shortness of breath: Secondary | ICD-10-CM

## 2021-02-27 DIAGNOSIS — J432 Centrilobular emphysema: Secondary | ICD-10-CM

## 2021-02-27 DIAGNOSIS — R911 Solitary pulmonary nodule: Secondary | ICD-10-CM

## 2021-02-27 LAB — PULMONARY FUNCTION TEST
DL/VA % pred: 74 %
DL/VA: 3.06 ml/min/mmHg/L
DLCO cor % pred: 72 %
DLCO cor: 16.18 ml/min/mmHg
DLCO unc % pred: 72 %
DLCO unc: 16.18 ml/min/mmHg
FEF 25-75 Post: 1.11 L/sec
FEF 25-75 Pre: 1.33 L/sec
FEF2575-%Change-Post: -16 %
FEF2575-%Pred-Post: 43 %
FEF2575-%Pred-Pre: 52 %
FEV1-%Change-Post: -15 %
FEV1-%Pred-Post: 69 %
FEV1-%Pred-Pre: 82 %
FEV1-Post: 1.98 L
FEV1-Pre: 2.34 L
FEV1FVC-%Change-Post: -13 %
FEV1FVC-%Pred-Pre: 83 %
FEV6-%Change-Post: -1 %
FEV6-%Pred-Post: 99 %
FEV6-%Pred-Pre: 100 %
FEV6-Post: 3.52 L
FEV6-Pre: 3.57 L
FEV6FVC-%Change-Post: 0 %
FEV6FVC-%Pred-Post: 103 %
FEV6FVC-%Pred-Pre: 102 %
FVC-%Change-Post: -1 %
FVC-%Pred-Post: 96 %
FVC-%Pred-Pre: 97 %
FVC-Post: 3.54 L
FVC-Pre: 3.61 L
Post FEV1/FVC ratio: 56 %
Post FEV6/FVC ratio: 100 %
Pre FEV1/FVC ratio: 65 %
Pre FEV6/FVC Ratio: 99 %
RV % pred: 115 %
RV: 2.47 L
TLC % pred: 112 %
TLC: 6.2 L

## 2021-02-27 NOTE — Progress Notes (Signed)
Full PFT performed today. °

## 2021-02-27 NOTE — Progress Notes (Signed)
Subjective:   PATIENT ID: Amber Livingston GENDER: female DOB: [birth year], MRN: [id number]   HPI  Chief Complaint  Patient presents with   Follow-up    PFT performed today.  Pt states she has been okay since last visit. States she is having a flare up with fibromyalgia which is affecting her whole body.    Reason for Visit: Follow-up with emphysema  Amber Livingston is a 60 year old female former smoker (46 pack years) with SVT, HLD, spinal stenosis and atrial mass who presents for follow-up  Synopsis:  2021-2022: Revamped her life and changed her diet and starting to see doctors. She has had shortness of breath that limits her activity including vacuuming in her home. She has other chronic issues including back pain and fibromyalgia that affect her activity.  She recently had a CT lung screen completed on 11/07/20 which demonstrated emphysema and small multiple pulmonary nodules. She quit smoking February 2022. She is still taking lozenges. She uses edibles but does not smoke marijuana. Her shortness of breath has worsened in last 9 months. No wheezing or cough. She is not on inhalers.  02/27/21 She is in active fibromyalgia flare and being seen by a pain doctor but not prescribed anything at this time. For her COPD, Spiriva one puff twice a day. She uses albuterol once a day which is improved. Reports shortness of breath improved, occurs with exertion. Denies coughing, wheezing. She reports pulse oximetry with SpO02 96%. She is walking 2-3 times a week for 15 minutes.  Social History: Former smoker. 46 pack-year. 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     Cyanocobalamin (B-12) 2500 MCG TABS Take by mouth daily.     D-Ribose (RIBOSE, D,) POWD 2,500 mg by Does not apply route in the morning and at bedtime.     diphenhydramine-acetaminophen (TYLENOL PM) 25-500 MG TABS tablet Take 2 tablets by mouth at bedtime as needed.     DULoxetine (CYMBALTA) 30 MG capsule Take 1 capsule (30 mg total) by mouth once daily. 30 capsule 0   estradiol (ESTRACE) 0.5 MG tablet Take 1 tablet (0.5 mg total) by mouth daily. 30 tablet 0   ibuprofen (ADVIL) 200 MG tablet Take 600 mg by mouth every 6 (six) hours as needed for moderate pain.     MAGNESIUM BISGLYCINATE PO Take 1,000 mg by mouth daily.     medroxyPROGESTERone (PROVERA) 2.5 MG tablet Take 1 tablet (2.5 mg total) by mouth daily. 30 tablet 0   nicotine polacrilex (COMMIT) 4 MG lozenge Take 4 mg by mouth as needed for smoking cessation.     NON FORMULARY as needed. CBD gummies     PROAIR HFA 108 (90 Base) MCG/ACT inhaler INHALE 2 PUFFS BY MOUTH EVERY 6 HOURS AS NEEDED FOR WHEEZING OR SHORT OF BREATH 34 g 11   Tiotropium Bromide Monohydrate (SPIRIVA RESPIMAT) 2.5 MCG/ACT AERS Inhale 2 puffs into the lungs daily. 4 g 4   traZODone (DESYREL) 50 MG tablet TAKE 1/2 TABLET (25MG  TOTAL) BY MOUTH AT BEDTIME  30 tablet 0   Turmeric (QC TUMERIC COMPLEX PO) Take 1,000 mg by mouth daily.     No facility-administered medications prior to visit.    Review of Systems  Constitutional:  Negative for chills, diaphoresis, fever, malaise/fatigue and weight loss.  HENT:  Negative for congestion.   Respiratory:  Positive for shortness of breath. Negative for cough, hemoptysis, sputum production and wheezing.   Cardiovascular:  Negative for chest pain, palpitations and leg swelling.    Objective:   Vitals:   02/27/21 1338  BP: 110/70  Pulse: 75  Temp: 97.7 F (36.5 C)  TempSrc: Oral  SpO2: 96%  Weight: 121 lb (54.9 kg)  Height: 5\' 7"  (1.702 m)   SpO2: 96 % O2 Device: None (Room  air)  Physical Exam: General: Well-appearing, no acute distress HENT: [place], AT Eyes: EOMI, no scleral icterus Respiratory: Clear to auscultation bilaterally.  No crackles, wheezing or rales Cardiovascular: RRR, -M/R/G, no JVD Extremities:-Edema,-tenderness Neuro: AAO x4, CNII-XII grossly intact Psych: Normal mood, normal affect   Data Reviewed:  Imaging: CT chest lung screening 11/06/2020-multiple small lung nodules with largest measuring 6.6 mm in the right lower lobe.  Background emphysema  PFT: 02/27/21 FVC 3.54 (96%) FEV1 1.98 (69%) Ratio 65  TLC 112% DLCO 72% Interpretation: Moderate obstructive defect with mildly reduced gas exchanged. Normal lung volumes.  Labs: CBC    Component Value Date/Time   WBC 4.2 09/27/2020 1042   RBC 3.99 09/27/2020 1042   HGB 12.7 09/27/2020 1042   HCT 37.5 09/27/2020 1042   PLT 321 09/27/2020 1042   MCV 94 09/27/2020 1042   MCH 31.8 09/27/2020 1042   MCHC 33.9 09/27/2020 1042   RDW 12.5 09/27/2020 1042   LYMPHSABS 2.0 03/02/2020 1846   EOSABS 0.3 03/02/2020 1846   BASOSABS 0.1 03/02/2020 1846   Absolute eos 03/02/20 300    Assessment & Plan:   Discussion: 60 year old female former smoker (46 pack-years) with emphysema who presents for follow-up. Discussed inhaler management and compliance. Discussed action plan for exacerbation.  Shortness of breath - improving, persistent Can be multi factorial in setting of emphysema, fibromyalgia, deconditioning.  Management as noted below  Emphysema --CONTINUE Spiriva 2.5 mcg TWO puffs ONCE a day. Refill at next visit --CONTINUE Albuterol as needed for shortness of breath or wheezing  Lung nodules --Scheduled for CT Lung screen in September 2022. Call our office when this is completed to discuss  Health Maintenance Immunization History  Administered Date(s) Administered   Moderna Sars-Covid-2 Vaccination 11/18/2019, 12/16/2019   CT Lung Screen - not indicated. CT as above  No orders of  the defined types were placed in this encounter.  No orders of the defined types were placed in this encounter.   Return in about 3 months (around 05/30/2021).  I have spent a total time of 31-minutes on the day of the appointment reviewing prior documentation, coordinating care and discussing medical diagnosis and plan with the patient/family. Imaging, labs and tests included in this note have been reviewed and interpreted independently by me.  [place], MD [facility] 02/27/2021 2:04 PM  Office Number [phone]

## 2021-02-27 NOTE — Patient Instructions (Signed)
Shortness of breath - improving, persistent Can be multi factorial in setting of emphysema, fibromyalgia, deconditioning.  Management as noted below  Emphysema --CONTINUE Spiriva 2.5 mcg TWO puffs ONCE a day. Refill at next visit --CONTINUE Albuterol as needed for shortness of breath or wheezing  Lung nodules --Scheduled for CT Lung screen in September 2022. Call our office when this is completed to discuss  Follow-up with me in 3 months

## 2021-02-27 NOTE — Patient Instructions (Signed)
Full PFT performed today. °

## 2021-02-28 ENCOUNTER — Ambulatory Visit: Payer: Self-pay | Admitting: Licensed Clinical Social Worker

## 2021-02-28 DIAGNOSIS — F339 Major depressive disorder, recurrent, unspecified: Secondary | ICD-10-CM

## 2021-02-28 DIAGNOSIS — F411 Generalized anxiety disorder: Secondary | ICD-10-CM

## 2021-02-28 NOTE — BH Specialist Note (Signed)
Integrated Behavioral Health Follow Up In-Person Visit  MRN: [id number] Name: Amber Livingston   Total time: 60 minutes  Types of Service: Telephone visit Patient consents to telephone visit and 2 patient identifiers were used to identify patient   Interpretor:No. Interpretor Name and Language: N/A  Subjective: Amber Livingston is a 60 y.o. female accompanied by  herself Patient was referred by [name], NP for mental health. Patient reports the following symptoms/concerns: The patient stated that she has been doing okay since her last follow-up session. She explained that she has been having difficulty with her cell phone but found an update to resolve the issue. She apologized for missing her last appointment. The patient noted that her attorney for disability called her with requests for documentation and additional steps the patient needed to complete. The patient explained that she felt overwhelmed by the amount of work this would require on her part. She discussed several health related stressors in her life. She discussed her up coming medical appointments, and explained that she is very concerned that she may have a mass on her breast that her resent mammogram may have detected. She noted she has a follow-up scheduled for that as well. The patient discussed her difficulties with not smoking cannabis due to her emphysema. The patient asked if she could make her appointments weekly for awhile. She noted that she is doing well on Cymbalta. The patient denied any suicidal or homicidal thoughts.  Duration of problem: Years; Severity of problem: moderate  Objective: Mood: Euthymic and Affect: Appropriate Risk of harm to self or others: No plan to harm self or others  Life Context: Family and Social: see above School/Work: see above Self-Care: see above Life Changes: see above  Patient and/or Family's Strengths/Protective Factors: Concrete supports in place (healthy food, safe environments,  etc.)  Goals Addressed: Patient will:  Reduce symptoms of: anxiety, depression, and stress   Increase knowledge and/or ability of: coping skills, healthy habits, self-management skills, and stress reduction   Demonstrate ability to: Increase healthy adjustment to current life circumstances  Progress towards Goals: Ongoing  Interventions: Interventions utilized:  CBT Cognitive Behavioral Therapy was utilized by the clinician during today's follow up session. The clinician processed with the patient how they have been doing since the last follow-up session. The clinician provided a space for the patient to ventilate their frustrations regarding their current life circumstances. Clinician measured the patient's anxiety and depression on a numerical scale. Clinician encouraged the patient to take their medication at the same time everyday exactly as prescribed for it to reach it's full intended effect. Clinician explained that it sounded like the patient was doing everything she could to improve her health such as keeping her medical appointments, and stopping cannabis use. Clinician congratulated her using her coping skills to deal with her current life circumstances. Clinician encouraged the patient to incorporate self care into her daily routine to build resilience to the chronic stress in her life.  Standardized Assessments completed: GAD-7 and PHQ 9 PHQ-9      11 GAD-7      10   Assessment: Patient currently experiencing see above.   Patient may benefit from see above.  Plan: //Follow up with behavioral health clinician on : 03/07/2021 at 12:00 PM  //Behavioral recommendations:  Referral(s): [facility] (In Clinic) "From scale of 1-10, how likely are you to follow plan?":     Lesli Albee, LCSWA

## 2021-03-05 ENCOUNTER — Other Ambulatory Visit (HOSPITAL_COMMUNITY)
Admission: RE | Admit: 2021-03-05 | Discharge: 2021-03-05 | Disposition: A | Discharge status: Home / Self Care | Payer: Medicaid Other | Source: Ambulatory Visit | Attending: Obstetrics and Gynecology | Admitting: Obstetrics and Gynecology

## 2021-03-05 ENCOUNTER — Other Ambulatory Visit: Payer: Self-pay

## 2021-03-05 ENCOUNTER — Encounter: Payer: Self-pay | Admitting: Obstetrics and Gynecology

## 2021-03-05 ENCOUNTER — Ambulatory Visit (INDEPENDENT_AMBULATORY_CARE_PROVIDER_SITE_OTHER): Payer: Self-pay | Admitting: Obstetrics and Gynecology

## 2021-03-05 VITALS — BP 101/64 | HR 84 | Ht 67.0 in | Wt 123.5 lb

## 2021-03-05 DIAGNOSIS — R232 Flushing: Secondary | ICD-10-CM

## 2021-03-05 DIAGNOSIS — Z124 Encounter for screening for malignant neoplasm of cervix: Secondary | ICD-10-CM | POA: Insufficient documentation

## 2021-03-05 DIAGNOSIS — Z01419 Encounter for gynecological examination (general) (routine) without abnormal findings: Secondary | ICD-10-CM

## 2021-03-05 DIAGNOSIS — Z79890 Hormone replacement therapy (postmenopausal): Secondary | ICD-10-CM

## 2021-03-05 MED ORDER — ESTRADIOL 0.5 MG PO TABS
0.5000 mg | ORAL_TABLET | Freq: Every day | ORAL | 3 refills | Status: DC
  Filled 2021-03-05: qty 90, 90d supply, fill #0
  Filled 2021-07-01: qty 30, 30d supply, fill #1
  Filled 2021-08-06: qty 30, 30d supply, fill #2
  Filled 2021-09-18: qty 30, 30d supply, fill #3

## 2021-03-05 MED ORDER — MEDROXYPROGESTERONE ACETATE 2.5 MG PO TABS
2.5000 mg | ORAL_TABLET | Freq: Every day | ORAL | 3 refills | Status: DC
  Filled 2021-03-05: qty 90, 90d supply, fill #0
  Filled 2021-07-01: qty 90, 90d supply, fill #1

## 2021-03-05 NOTE — Addendum Note (Signed)
Addended by: Durwin Glaze on: 03/05/2021 10:40 AM   Modules accepted: Orders

## 2021-03-05 NOTE — Progress Notes (Signed)
HPI:      [name] is a 61 y.o. G3P0030 who LMP was No LMP recorded. Patient has had an ablation.  Subjective:   She presents today for her annual examination.  Reports no issues, no bleeding on HRT.  Would like to continue.   Quit smoking in Feb!!!!! > 30 lbs of intended weight loss. Here for AE and pap.  Had mammo - "2 months ago" Pt has had an ablation.    Hx: The following portions of the patient's history were reviewed and updated as appropriate:             She  has a past medical history of Arthritis, Cervical spinal stenosis, Emphysema, unspecified ([place]), Fibromyalgia, Lumbar stenosis, and Uterine fibroid. She does not have any pertinent problems on file. She  has a past surgical history that includes Dilation and curettage of uterus; laparoscopy; Appendectomy; ablation; PALPITATION; and Tonsillectomy. Her family history includes Alcohol abuse in her maternal grandmother; Brain cancer in her mother; Breast cancer (age of onset: 74) in her paternal grandmother; Dementia in her father, paternal grandfather, and paternal uncle; Hypertension in her mother; Lung cancer in her mother; Prostate cancer in her father; Uterine cancer (age of onset: 42) in her mother. She  reports that she quit smoking about 5 months ago. Her smoking use included cigarettes. She has a 46.00 pack-year smoking history. She has never used smokeless tobacco. She reports previous alcohol use. She reports previous drug use. Frequency: 7.00 times per week. Drug: Marijuana. She has a current medication list which includes the following prescription(s): bee pollen, b-12, ribose (d), diphenhydramine-acetaminophen, duloxetine, ibuprofen, magnesium bisglycinate, nicotine polacrilex, NON FORMULARY, proair hfa, spiriva respimat, trazodone, turmeric, estradiol, and medroxyprogesterone. She has No Known Allergies.       Review of Systems:  Review of Systems  Constitutional: Denied constitutional symptoms, night sweats,  recent illness, fatigue, fever, insomnia and weight loss.  Eyes: Denied eye symptoms, eye pain, photophobia, vision change and visual disturbance.  Ears/Nose/Throat/Neck: Denied ear, nose, throat or neck symptoms, hearing loss, nasal discharge, sinus congestion and sore throat.  Cardiovascular: Denied cardiovascular symptoms, arrhythmia, chest pain/pressure, edema, exercise intolerance, orthopnea and palpitations.  Respiratory: Denied pulmonary symptoms, asthma, pleuritic pain, productive sputum, cough, dyspnea and wheezing.  Gastrointestinal: Denied, gastro-esophageal reflux, melena, nausea and vomiting.  Genitourinary: Denied genitourinary symptoms including symptomatic vaginal discharge, pelvic relaxation issues, and urinary complaints.  Musculoskeletal: Denied musculoskeletal symptoms, stiffness, swelling, muscle weakness and myalgia.  Dermatologic: Denied dermatology symptoms, rash and scar.  Neurologic: Denied neurology symptoms, dizziness, headache, neck pain and syncope.  Psychiatric: Denied psychiatric symptoms, anxiety and depression.  Endocrine: Denied endocrine symptoms including hot flashes and night sweats.   Meds:   Current Outpatient Medications on File Prior to Visit  Medication Sig Dispense Refill   BEE POLLEN PO Take 1 capsule by mouth daily.     Cyanocobalamin (B-12) 2500 MCG TABS Take by mouth daily.     D-Ribose (RIBOSE, D,) POWD 2,500 mg by Does not apply route in the morning and at bedtime.     diphenhydramine-acetaminophen (TYLENOL PM) 25-500 MG TABS tablet Take 2 tablets by mouth at bedtime as needed.     DULoxetine (CYMBALTA) 30 MG capsule Take 1 capsule (30 mg total) by mouth once daily. 30 capsule 0   ibuprofen (ADVIL) 200 MG tablet Take 600 mg by mouth every 6 (six) hours as needed for moderate pain.     MAGNESIUM BISGLYCINATE PO Take 1,000 mg by mouth daily.  nicotine polacrilex (COMMIT) 4 MG lozenge Take 4 mg by mouth as needed for smoking cessation.      NON FORMULARY as needed. CBD gummies     PROAIR HFA 108 (90 Base) MCG/ACT inhaler INHALE 2 PUFFS BY MOUTH EVERY 6 HOURS AS NEEDED FOR WHEEZING OR SHORT OF BREATH 34 g 11   Tiotropium Bromide Monohydrate (SPIRIVA RESPIMAT) 2.5 MCG/ACT AERS Inhale 2 puffs into the lungs daily. 4 g 4   traZODone (DESYREL) 50 MG tablet TAKE 1/2 TABLET (25MG  TOTAL) BY MOUTH AT BEDTIME 30 tablet 0   Turmeric (QC TUMERIC COMPLEX PO) Take 1,000 mg by mouth daily.     No current facility-administered medications on file prior to visit.       Objective:     Vitals:   03/05/21 0906  BP: 101/64  Pulse: 84    Filed Weights   03/05/21 0906  Weight: 123 lb 8 oz (56 kg)              Physical examination General NAD, Conversant  HEENT Atraumatic; Op clear with mmm.  Normo-cephalic. Pupils reactive. Anicteric sclerae  Thyroid/Neck Smooth without nodularity or enlargement. Normal ROM.  Neck Supple.  Skin No rashes, lesions or ulceration. Normal palpated skin turgor. No nodularity.  Breasts: No masses or discharge.  Symmetric.  No axillary adenopathy.  Lungs: Clear to auscultation.No rales or wheezes. Normal Respiratory effort, no retractions.  Heart: NSR.  No murmurs or rubs appreciated. No periferal edema  Abdomen: Soft.  Non-tender.  No masses.  No HSM. No hernia  Extremities: Moves all appropriately.  Normal ROM for age. No lymphadenopathy.  Neuro: Oriented to PPT.  Normal mood. Normal affect.     Pelvic:   Vulva: Normal appearance.  No lesions.  Vagina: No lesions or abnormalities noted.  Support: Normal pelvic support.  Urethra No masses tenderness or scarring.  Meatus Normal size without lesions or prolapse.  Cervix: Normal appearance.  No lesions.  Stenosis noted  Anus: Normal exam.  No lesions.  Perineum: Normal exam.  No lesions.        Bimanual   Uterus: Normal size.  Non-tender.  Mobile.  AV.  Adnexae: No masses.  Non-tender to palpation.  Cul-de-sac: Negative for abnormality.      Assessment:    G3P0030 Patient Active Problem List   Diagnosis Date Noted   Centrilobular emphysema ([place]) 12/30/2020   Lung nodule 12/30/2020   Skin mole 12/21/2020   History of depression 12/21/2020   SVT (supraventricular tachycardia) ([place]) 11/07/2020   Counseling on health promotion and disease prevention 10/24/2020   Palpitations 08/25/2020   DOE (dyspnea on exertion) 08/25/2020   Mixed hyperlipidemia 08/25/2020   History of palpitations 07/05/2020   Shortness of breath 07/05/2020   Chronic bilateral low back pain without sciatica 06/06/2020   Lumbar facet arthropathy 06/06/2020   Myofascial pain 06/06/2020   Chronic pain syndrome 06/06/2020   Cannabis use disorder, mild, abuse 06/06/2020   Fibromyalgia 06/06/2020   Elevated lipids 03/09/2020   Encounter to establish care 02/22/2020   Chronic abdominal pain 02/22/2020   Hot flashes 02/22/2020   Chronic back pain 02/22/2020   Anxiety 02/22/2020     1. Well woman exam with routine gynecological exam   2. Postmenopausal hormone therapy   3. Hot flashes     HRT going well - she would like to continue.   Plan:            1.  Basic Screening Recommendations The basic  screening recommendations for asymptomatic women were discussed with the patient during her visit.  The age-appropriate recommendations were discussed with her and the rational for the tests reviewed.  When I am informed by the patient that another primary care physician has previously obtained the age-appropriate tests and they are up-to-date, only outstanding tests are ordered and referrals given as necessary.  Abnormal results of tests will be discussed with her when all of her results are completed.  Routine preventative health maintenance measures emphasized: Exercise/Diet/Weight control, Tobacco Warnings, Alcohol/Substance use risks and Stress Management Pap performed 2.  Continue HRT  Orders No orders of the defined types were placed in this  encounter.    Meds ordered this encounter  Medications   estradiol (ESTRACE) 0.5 MG tablet    Sig: Take 1 tablet (0.5 mg total) by mouth daily.    Dispense:  90 tablet    Refill:  3   medroxyPROGESTERone (PROVERA) 2.5 MG tablet    Sig: Take 1 tablet (2.5 mg total) by mouth daily.    Dispense:  90 tablet    Refill:  3            F/U  Return in about 1 year (around 03/05/2022) for Annual Physical.  Finis Bud, M.D. 03/05/2021 9:27 AM

## 2021-03-06 ENCOUNTER — Other Ambulatory Visit: Payer: Self-pay

## 2021-03-07 ENCOUNTER — Other Ambulatory Visit: Payer: Self-pay | Admitting: Gerontology

## 2021-03-07 ENCOUNTER — Other Ambulatory Visit: Payer: Self-pay

## 2021-03-07 ENCOUNTER — Ambulatory Visit: Payer: Self-pay | Admitting: Licensed Clinical Social Worker

## 2021-03-07 DIAGNOSIS — F341 Dysthymic disorder: Secondary | ICD-10-CM

## 2021-03-07 DIAGNOSIS — F451 Undifferentiated somatoform disorder: Secondary | ICD-10-CM

## 2021-03-07 DIAGNOSIS — F339 Major depressive disorder, recurrent, unspecified: Secondary | ICD-10-CM

## 2021-03-07 DIAGNOSIS — F411 Generalized anxiety disorder: Secondary | ICD-10-CM

## 2021-03-07 LAB — CYTOLOGY - PAP
Comment: NEGATIVE
Diagnosis: NEGATIVE
High risk HPV: NEGATIVE

## 2021-03-07 NOTE — BH Specialist Note (Signed)
Integrated Behavioral Health Follow Up In-Person Visit  MRN: [id number] Name: Amber Livingston   Total time: 30 minutes  Types of Service: Telephone visit Patient consents to telephone visit and 2 patient identifiers were used to identify patient   Interpretor:No. Interpretor Name and Language: N/A  Subjective: Amber Livingston is a 60 y.o. female accompanied by  herself Patient was referred by [name], NP for mental health. Patient reports the following symptoms/concerns: The patient reports that she is doing well overall since her last follow up session. She stated that she is doing so well on the Cymbalta and is able to get more accomplished everyday. She noted that she is having difficulty getting a refill and requested I put in a request with her provider for a refill. She stated that she has one does left and is very anxious that she will lose her progress. Amber Livingston explained that she is taking it easy today and watching her favorite television shows. She noted that yesterday she pushed herself a little too hard and needs today to recuperate. She shared that her lungs are not good and she is seeing a specialist and is concerned about the medical bills piling up and ruining her credit. She stated she turned in her charity care paperwork a month ago and has not heard back. She stated when she called she was told she has to wait for a letter. Amber Livingston explained that she is not sure what is going on but requested help navigating the system.The patient denied any suicidal or homicidal thoughts.  Duration of problem: Years; Severity of problem: moderate  Objective: Mood: Euthymic and Affect: Appropriate Risk of harm to self or others: No plan to harm self or others  Life Context: Family and Social: see above School/Work: see above Self-Care: see above Life Changes: see above  Patient and/or Family's Strengths/Protective Factors: Concrete supports in place (healthy food, safe environments,  etc.)  Goals Addressed: Patient will:  Reduce symptoms of: agitation, anxiety, depression, insomnia, mood instability, and stress   Increase knowledge and/or ability of: coping skills, healthy habits, self-management skills, and stress reduction   Demonstrate ability to: Increase healthy adjustment to current life circumstances and Increase adequate support systems for patient/family  Progress towards Goals: Ongoing  Interventions: Interventions utilized:  CBT Cognitive Behavioral Therapy was utilized by the clinician during today's follow up session. The clinician processed with the patient how they have been doing since the last follow-up session. The clinician provided a space for the patient to ventilate their frustrations regarding their current life circumstances. Clinician measured the patient's anxiety and depression on a numerical scale. Clinician processed with the patient her feelings regarding the diagnosis she received from her pulmonologist.  Clinician encouraged the patient to implement self care into her daily routine. Clinician offered to send her primary care provider a message requesting her medication refill be sent to medication management. Clinician encouraged the patient to focus on the positives in her life verses the negatives.  Standardized Assessments completed: GAD-7 and PHQ 9 GAD-7  8 PHQ-9  9  Assessment: Patient currently experiencing see above.   Patient may benefit from see above.  Plan: Follow up with behavioral health clinician on : 03/15/2021 at 12:00 PM  Behavioral recommendations:  Referral(s): [place] (In Clinic) "From scale of 1-10, how likely are you to follow plan?":   Lesli Albee, LCSWA

## 2021-03-08 ENCOUNTER — Other Ambulatory Visit: Payer: Self-pay

## 2021-03-08 ENCOUNTER — Other Ambulatory Visit: Payer: Self-pay | Admitting: Gerontology

## 2021-03-08 DIAGNOSIS — F341 Dysthymic disorder: Secondary | ICD-10-CM

## 2021-03-08 MED ORDER — DULOXETINE HCL 30 MG PO CPEP
30.0000 mg | ORAL_CAPSULE | Freq: Every day | ORAL | 0 refills | Status: DC
  Filled 2021-03-08: qty 30, 30d supply, fill #0

## 2021-03-09 ENCOUNTER — Other Ambulatory Visit: Payer: Self-pay

## 2021-03-15 ENCOUNTER — Ambulatory Visit: Payer: Self-pay | Admitting: Licensed Clinical Social Worker

## 2021-03-15 ENCOUNTER — Other Ambulatory Visit: Payer: Self-pay

## 2021-03-15 DIAGNOSIS — F451 Undifferentiated somatoform disorder: Secondary | ICD-10-CM

## 2021-03-15 DIAGNOSIS — F341 Dysthymic disorder: Secondary | ICD-10-CM

## 2021-03-15 DIAGNOSIS — F339 Major depressive disorder, recurrent, unspecified: Secondary | ICD-10-CM

## 2021-03-15 NOTE — BH Specialist Note (Signed)
Integrated Behavioral Health Follow Up In-Person Visit  MRN: [id number] Name: Amber Livingston  Total time: 15 minutes  Types of Service: Telephone visit Patient consents to telephone visit and 2 patient identifiers were used to identify patient   Interpretor:No. Interpretor Name and Language: N/A  Subjective: Amber Livingston is a 60 y.o. female accompanied by  herself Patient was referred by [name], NP for Mental Health. Patient reports the following symptoms/concerns: The patient stated she is more upset since the last follow-up session. She reported that she was not contacted by the social worker at the [facility] regarding the status of the [place]. She noted that she spoke to the Office Coordinator at the [facility] and was not given any information and when she asked to speak to someone who could tell her what was happening she was given a number and told to call to check the status of her application. She explained that when she called the number was to the [facility] health billing department and she felt like she was being harassed to pay her bills instead of being helped. The patient specifically asked that her pain levels from Sunday to Monday be noted in her chart as a level two today. She stated that while at the pain clinic she was told that her Cymbalta would likely need to be increased during week 6 to 8. She stated they told her to tell her mental health therapist to log this in her chart. She request her next appointment and indicated she was ending the appointment. The patient denied any suicidal or homicidal thoughts. Duration of problem: Years; Severity of problem: moderate  Objective: Mood: Angry and Affect: Appropriate Risk of harm to self or others: No plan to harm self or others  Life Context: Family and Social: see above School/Work: see above Self-Care: see above Life Changes: see above  Patient and/or Family's  Strengths/Protective Factors: Concrete supports in place (healthy food, safe environments, etc.)  Goals Addressed: Patient will:  Reduce symptoms of: agitation, anxiety, depression, mood instability, and stress   Increase knowledge and/or ability of: coping skills, healthy habits, self-management skills, and stress reduction   Demonstrate ability to: Increase healthy adjustment to current life circumstances, Increase adequate support systems for patient/family, and Increase motivation to adhere to plan of care  Progress towards Goals: Ongoing  Interventions: Interventions utilized:  Supportive Counselingwas utilized by the clinician during today's follow up session. The clinician processed with the patient how they have been doing since the last follow-up session. The clinician provided a space for the patient to ventilate their frustrations regarding their current life circumstances. Clinician attempted to measured the patient's anxiety and depression on a numerical scale but the patient refused. The clinician encouraged the patient to utilize their coping skills to deal with their current life circumstances. Clinician explained to the patient that she was concerned because she was so upset and encouraged the patient to practice deep breathing to calm down. Clinician explained that the social worker just started this week and has not had an opportunity to contact every patient yet, reassured her she would reach out to her as soon as possible. Clinician also reassured the patient that she would find out more information regarding her charity care application as soon as the office coordinator returned next week and give her a call as soon as information became available. clinician provided Psychoeducational support regarding Cymbalta and explained that while adjustments in dosage may be necessary for  some people it is not true for all people and that she did not need to add specifics detail to her chart  to qualify.  Standardized Assessments completed: Patient declined screening   Assessment: Patient currently experiencing see above.   Patient may benefit from see above.  Plan: Follow up with behavioral health clinician on : 03/22/2021 at 2:00 PM  Behavioral recommendations:  Referral(s): [place] (In Clinic) "From scale of 1-10, how likely are you to follow plan?":   Lesli Albee, LCSWA

## 2021-03-16 ENCOUNTER — Encounter: Payer: Self-pay | Admitting: Pulmonary Disease

## 2021-03-22 ENCOUNTER — Ambulatory Visit: Payer: Self-pay | Admitting: Licensed Clinical Social Worker

## 2021-03-22 ENCOUNTER — Other Ambulatory Visit: Payer: Self-pay

## 2021-03-22 ENCOUNTER — Telehealth: Payer: Self-pay

## 2021-03-22 DIAGNOSIS — F451 Undifferentiated somatoform disorder: Secondary | ICD-10-CM

## 2021-03-22 DIAGNOSIS — F339 Major depressive disorder, recurrent, unspecified: Secondary | ICD-10-CM

## 2021-03-22 NOTE — BH Specialist Note (Signed)
Integrated Behavioral Health Follow Up In-Person Visit  MRN: [id number] Name: Amber Livingston   Total time: 20 minutes  Types of Service: Telephone visitPatient consents to telephone visit and 2 patient identifiers were used to identify patient   Interpretor:No. Interpretor Name and Language: N/A  Subjective: Amber Livingston is a 60 y.o. female accompanied by  herself Patient was referred by [name], NP  for Mental Health. Patient reports the following symptoms/concerns: The patient reported that nothing has changed since her last follow-up appointment. The patient stated that she was still no sure of what to do regarding finding out the status of her charity care application with [facility]. She noted that her anxiety was getting pretty bad and felt this was making it worse. She expressed concerns about affording her upcoming medical appointments and noted that her medical bills were already piling up and going against her credit rating. The patient discussed how the weather impacts her fibromyalgia. She discussed financial and health stressors impacting her current life. She noted that her sleep schedule varies due to her pain levels. She notes someday's she cannot get out of bed due to the fibro fog and has to rest for several days after a flare up. The patient denied any suicidal or homicidal thoughts.  Duration of problem: Years; Severity of problem: moderate  Objective: Mood: Anxious and Affect: Appropriate Risk of harm to self or others: No plan to harm self or others  Life Context: Family and Social: see above School/Work: see above  Self-Care: see above Life Changes: see above  Patient and/or Family's Strengths/Protective Factors: Concrete supports in place (healthy food, safe environments, etc.)  Goals Addressed: Patient will:  Reduce symptoms of: agitation, anxiety, depression, insomnia, and stress   Increase knowledge and/or ability of: coping skills, healthy habits,  self-management skills, and stress reduction   Demonstrate ability to: Increase healthy adjustment to current life circumstances  Progress towards Goals: Ongoing  Interventions: Interventions utilized:  CBT Cognitive Behavioral Therapy was utilized by the clinician during today's follow up session. The clinician processed with the patient how they have been doing since the last follow-up session. The clinician provided a space for the patient to ventilate their frustrations regarding their current life circumstances. Clinician measured the patient's anxiety and depression on a numerical scale.  Clinician assessed the patients pattern of sleep, bedtime routines, activities associated with the bed, activity and energy level while awake, night time snacking, stimulant use, daytime napping, total sleep amounts. Clinician explored the patients thoughts and associated emotions regarding sleep.  Standardized Assessments completed: GAD-7 and PHQ 9 GAD-7       12 PHQ-9       13   Assessment: Patient currently experiencing see above.   Patient may benefit from see above.  Plan: Follow up with behavioral health clinician on : 03/29/2021 at 3:00 PM  Behavioral recommendations:  Referral(s): [place] (In Clinic) "From scale of 1-10, how likely are you to follow plan?":   Lesli Albee, LCSWA

## 2021-03-22 NOTE — Telephone Encounter (Signed)
Social worker called patient to update her on her status with her re-certification and [facility]. Social worker informed patient that she has been re-certified for Medication Management and that it runs until February of 2023 and that her [facility] status is still being checked into and that patient will be contacted when something is found out but within 2 weeks.

## 2021-03-26 ENCOUNTER — Other Ambulatory Visit: Payer: Self-pay

## 2021-03-28 NOTE — Telephone Encounter (Signed)
Social worker called patient in regard to a phone message from patient and had to inform patient that she has heard nothing back about patient's [facility] status.

## 2021-04-02 ENCOUNTER — Other Ambulatory Visit: Payer: Self-pay | Admitting: *Deleted

## 2021-04-02 DIAGNOSIS — R911 Solitary pulmonary nodule: Secondary | ICD-10-CM

## 2021-04-02 DIAGNOSIS — Z87891 Personal history of nicotine dependence: Secondary | ICD-10-CM

## 2021-04-03 ENCOUNTER — Other Ambulatory Visit: Payer: Self-pay

## 2021-04-03 ENCOUNTER — Ambulatory Visit: Payer: Self-pay

## 2021-04-03 ENCOUNTER — Ambulatory Visit: Payer: Self-pay | Admitting: Licensed Clinical Social Worker

## 2021-04-03 ENCOUNTER — Other Ambulatory Visit: Payer: Self-pay | Admitting: Gerontology

## 2021-04-03 DIAGNOSIS — Z789 Other specified health status: Secondary | ICD-10-CM

## 2021-04-03 DIAGNOSIS — F341 Dysthymic disorder: Secondary | ICD-10-CM

## 2021-04-03 MED ORDER — DULOXETINE HCL 30 MG PO CPEP
30.0000 mg | ORAL_CAPSULE | Freq: Every day | ORAL | 0 refills | Status: DC
  Filled 2021-04-03: qty 30, 30d supply, fill #0

## 2021-04-03 NOTE — Telephone Encounter (Signed)
Social worker called patient to inform her that her prescription had been sent over and her [facility] paperwork emailed to the appropriate person. Patient had already picked up her prescription.

## 2021-04-06 NOTE — Progress Notes (Signed)
Patient came in to fill out a [facility] and social worker made sure she collected necessary paperwork.

## 2021-04-10 ENCOUNTER — Other Ambulatory Visit: Payer: Self-pay

## 2021-04-10 ENCOUNTER — Ambulatory Visit: Payer: Self-pay | Admitting: Licensed Clinical Social Worker

## 2021-04-10 DIAGNOSIS — F411 Generalized anxiety disorder: Secondary | ICD-10-CM

## 2021-04-10 DIAGNOSIS — F339 Major depressive disorder, recurrent, unspecified: Secondary | ICD-10-CM

## 2021-04-10 MED FILL — Albuterol Sulfate Inhal Aero 108 MCG/ACT (90MCG Base Equiv): RESPIRATORY_TRACT | 75 days supply | Qty: 25.5 | Fill #1 | Status: AC

## 2021-04-10 NOTE — BH Specialist Note (Signed)
Integrated Behavioral Health Follow Up Telephone Visit  MRN: [id number] Name: Amber Livingston   Total time: 60 minutes  Types of Service: Telephone visit Patient consents to telephone visit and 2 patient identifiers were used to identify patient   Interpretor:No. Interpretor Name and Language: N/A  Subjective: Amber Livingston is a 60 y.o. female accompanied by  herself Patient was referred by [name], NP for Mental Health. Patient reports the following symptoms/concerns: The patient reports that she has been doing okay since her last follow-up session. She explained [facility] her figure out what went wrong with her charity care application and she took steps to resubmit the documents. She explained that she has a heart procedure coming up and is very anxious she will be billed for it. She discussed other health and financial stressors impacting her life. She noted that she is taking Cymbalta 60 MG and recently picked up her refill. She explained that she feels mentally healthier and is overall coping better with stress. The patient denied any suicidal or homicidal thoughts.  Duration of problem: Years; Severity of problem: moderate  Objective: Mood: Euthymic and Affect: Appropriate Risk of harm to self or others: No plan to harm self or others  Life Context: Family and Social: see above  School/Work: see above Self-Care: see above Life Changes: see above  Patient and/or Family's Strengths/Protective Factors: Concrete supports in place (healthy food, safe environments, etc.)  Goals Addressed: Patient will:  Reduce symptoms of: agitation, anxiety, depression, and stress   Increase knowledge and/or ability of: coping skills, healthy habits, self-management skills, and stress reduction   Demonstrate ability to: Increase healthy adjustment to current life circumstances, Improve medication compliance, and Decrease self-medicating  behaviors  Progress towards Goals: Ongoing  Interventions: Interventions utilized:  CBT Cognitive Behavioral Therapy was utilized by the clinician during today's follow up session. The clinician processed with the patient how they have been doing since the last follow-up session. The clinician provided a space for the patient to ventilate their frustrations regarding their current life circumstances. Clinician measured the patient's anxiety and depression on a numerical scale. The clinician encouraged the patient to utilize their coping skills to deal with their current life circumstances. The clinician encouraged the client to focus on the positives in her life verses the negatives.  Standardized Assessments completed: GAD-7 and PHQ 9 GAD-7     9 PHQ-9   12  Assessment: Patient currently experiencing see above.   Patient may benefit from see above.  Plan: Follow up with behavioral health clinician on : 04/25/2021 at 4:00 PM  Behavioral recommendations:  Referral(s): [place] (In Clinic) "From scale of 1-10, how likely are you to follow plan?":   Lesli Albee, LCSWA

## 2021-04-19 ENCOUNTER — Ambulatory Visit (HOSPITAL_COMMUNITY): Payer: Medicaid Other | Attending: Cardiology

## 2021-04-19 ENCOUNTER — Other Ambulatory Visit: Payer: Self-pay

## 2021-04-19 DIAGNOSIS — I5189 Other ill-defined heart diseases: Secondary | ICD-10-CM | POA: Diagnosis present

## 2021-04-19 LAB — ECHOCARDIOGRAM COMPLETE
Area-P 1/2: 3.77 cm2
S' Lateral: 2.7 cm

## 2021-04-25 ENCOUNTER — Ambulatory Visit: Payer: Self-pay | Admitting: Licensed Clinical Social Worker

## 2021-04-25 ENCOUNTER — Other Ambulatory Visit: Payer: Self-pay | Admitting: Gerontology

## 2021-04-25 ENCOUNTER — Other Ambulatory Visit: Payer: Self-pay

## 2021-04-25 DIAGNOSIS — F339 Major depressive disorder, recurrent, unspecified: Secondary | ICD-10-CM

## 2021-04-25 DIAGNOSIS — F411 Generalized anxiety disorder: Secondary | ICD-10-CM

## 2021-04-25 DIAGNOSIS — F341 Dysthymic disorder: Secondary | ICD-10-CM

## 2021-04-25 NOTE — BH Specialist Note (Signed)
Integrated Behavioral Health Follow Up Telephone Visit  MRN: [id number] Name: Amber Livingston  Total time: 60 minutes  Types of Service: Telephone visit Patient consents to telephone visit and 2 patient identifiers were used to identify patient   Interpretor:No. Interpretor Name and Language: N/A  Subjective: Amber Livingston is a 59 y.o. female accompanied by  herself Patient was referred by [name], NP  for Mental Health. Patient reports the following symptoms/concerns: The patient reports that she has been doing okay since her lat follow-up appointment. She shared that she has had several days of "Fibro Flare" that have kept her in bed. She shared that she has finished her physical therapy and feels that she may have benefited from continuing a little longer but learned a lot about different exercised to loosen up her back that were helpful. [name] discussed health and financial stressors impacting her current life. She noted that she feels like she is not going to move past were she is and feels like she is stuck at times. She shared her desire is to return to work and a level of functioning that she had before her illnesses began. [name] denied any suicidal or homicidal thoughts.  Duration of problem: Years; Severity of problem: moderate  Objective: Mood: Euthymic and Affect: Appropriate Risk of harm to self or others: No plan to harm self or others  Life Context: Family and Social: see above School/Work: see above Self-Care: see above Life Changes: see above  Patient and/or Family's Strengths/Protective Factors: Concrete supports in place (healthy food, safe environments, etc.)  Goals Addressed: Patient will:  Reduce symptoms of: agitation, anxiety, depression, insomnia, and stress   Increase knowledge and/or ability of: coping skills, healthy habits, self-management skills, and stress reduction   Demonstrate ability to: Increase healthy adjustment to current life circumstances,  Increase adequate support systems for patient/family, and Decrease self-medicating behaviors  Progress towards Goals: Ongoing  Interventions: Interventions utilized:  CBT Cognitive Behavioral Therapy was utilized by the clinician during today's follow up session. The clinician processed with the patient how they have been doing since the last follow-up session. The clinician provided a space for the patient to ventilate their frustrations regarding their current life circumstances. Clinician measured the patient's anxiety and depression on a numerical scale.  Clinician processed with the patient some of her identified negative thoughts regarding her health and prompted the client to challenge these thoughts and replace them with positive ones. Clinician encouraged the client to continue to practice identifying her negative thoughts this week and journal them. Case consultation with Dr. Octavia Heir, MD, Psychiatric Consultant on Tuesday  05/01/2021 at 11:00 AM Standardized Assessments completed: GAD-7 and PHQ 9. GAD-7   9 PHQ-9   6   Assessment: Patient currently experiencing see above.   Patient may benefit from see above.  Plan: Follow up with behavioral health clinician on : 05/02/2021 at 11:00 PM  Behavioral recommendations: . Referral(s): [place] (In Clinic) "From scale of 1-10, how likely are you to follow plan?":.   Lesli Albee, LCSWA

## 2021-04-30 ENCOUNTER — Telehealth: Payer: Self-pay | Admitting: Internal Medicine

## 2021-04-30 NOTE — Telephone Encounter (Signed)
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  Has October Appt.  Rudean Haskell, MD [address], [address], [place] [address] [phone]  10:58 AM

## 2021-05-01 ENCOUNTER — Other Ambulatory Visit: Payer: Self-pay

## 2021-05-01 ENCOUNTER — Other Ambulatory Visit: Payer: Self-pay | Admitting: Gerontology

## 2021-05-01 DIAGNOSIS — F341 Dysthymic disorder: Secondary | ICD-10-CM

## 2021-05-01 MED FILL — Trazodone HCl Tab 50 MG: ORAL | 30 days supply | Qty: 15 | Fill #0 | Status: AC

## 2021-05-02 ENCOUNTER — Ambulatory Visit: Payer: Self-pay | Admitting: Licensed Clinical Social Worker

## 2021-05-03 ENCOUNTER — Ambulatory Visit: Payer: Self-pay | Admitting: Licensed Clinical Social Worker

## 2021-05-04 ENCOUNTER — Other Ambulatory Visit: Payer: Self-pay

## 2021-05-04 ENCOUNTER — Other Ambulatory Visit: Payer: Self-pay | Admitting: Gerontology

## 2021-05-04 DIAGNOSIS — F341 Dysthymic disorder: Secondary | ICD-10-CM

## 2021-05-08 ENCOUNTER — Other Ambulatory Visit: Payer: Self-pay

## 2021-05-08 ENCOUNTER — Ambulatory Visit: Payer: Self-pay | Admitting: Licensed Clinical Social Worker

## 2021-05-08 ENCOUNTER — Other Ambulatory Visit: Payer: Self-pay | Admitting: Gerontology

## 2021-05-08 DIAGNOSIS — F411 Generalized anxiety disorder: Secondary | ICD-10-CM

## 2021-05-08 DIAGNOSIS — F339 Major depressive disorder, recurrent, unspecified: Secondary | ICD-10-CM

## 2021-05-08 DIAGNOSIS — F341 Dysthymic disorder: Secondary | ICD-10-CM

## 2021-05-08 MED ORDER — DULOXETINE HCL 30 MG PO CPEP
30.0000 mg | ORAL_CAPSULE | Freq: Every day | ORAL | 0 refills | Status: DC
Start: 2021-05-08 — End: 2021-06-01
  Filled 2021-05-08: qty 30, 30d supply, fill #0

## 2021-05-08 NOTE — BH Specialist Note (Signed)
Integrated Behavioral Health Follow Up Telephone Visit  MRN: [id number]  Total time: 30 minutes  Types of Service: Telephone visit Patient consents to telephone visit and 2 patient identifiers were used to identify patient   Interpretor:No. Interpretor Name and Language: N/A  Subjective: Amber Livingston is a 60 y.o. female accompanied by  herself Patient was referred by [name], NP for N/A. Patient reports the following symptoms/concerns: The patient noted that she has been doing well since her last follow-up appointment. Amber Livingston explained that she has been thinking about how far she has come this year and all the progress she has made in therapy, and in her personal life. She shared that when she lived in [place] the barriers to healthcare kept her down and here in [place] she has been able to get on a road to a healthier body and mind. Amber Livingston shared that while Fibromyalgia is still difficult to deal  with when it puts her in bed for days at a time she feels she is heard by her team of doctors and they are listening to her. Amber Livingston shared that overall she feels she is on the right track. Amber Livingston denied any changes in her anxiety or depression symptoms.The patient denied any suicidal or homicidal thoughts.  Duration of problem: Years; Severity of problem: moderate  Objective: Mood: Euthymic and Affect: Appropriate Risk of harm to self or others: No plan to harm self or others  Life Context: Family and Social: see above  School/Work: see above Self-Care: see above Life Changes: see above  Patient and/or Family's Strengths/Protective Factors: Concrete supports in place (healthy food, safe environments, etc.)  Goals Addressed: Patient will:  Reduce symptoms of: agitation, anxiety, depression, insomnia, and stress   Increase knowledge and/or ability of: coping skills, healthy habits, self-management skills, and stress reduction   Demonstrate ability to: Increase healthy adjustment to  current life circumstances  Progress towards Goals: Ongoing  Interventions: Interventions utilized:  Supportive Counseling was utilized by the clinician during today's follow up session. The clinician processed with the patient how they have been doing since the last follow-up session. The clinician provided a space for the patient to ventilate their frustrations regarding their current life circumstances. Clinician measured the patient's anxiety and depression on a numerical scale. Clinician congratulated the patient on her progress. The clinician encouraged the patient to utilize their coping skills to deal with their current life circumstances.    Standardized Assessments completed: GAD-7 and PHQ 9 GAD-7 PHQ-9   Patient Centered Plan: Patient is on the following Treatment Plan(s): see above Assessment: Patient currently experiencing see above.   Patient may benefit from see above.  Plan: Follow up with behavioral health clinician on : 05/10/21 at 10:00 Am  Behavioral recommendations:  Referral(s): [place] (In Clinic) "From scale of 1-10, how likely are you to follow plan?":   Lesli Albee, LCSWA

## 2021-05-09 ENCOUNTER — Other Ambulatory Visit: Payer: Self-pay

## 2021-05-11 ENCOUNTER — Ambulatory Visit
Admission: RE | Admit: 2021-05-11 | Discharge: 2021-05-11 | Disposition: A | Discharge status: Home / Self Care | Payer: Medicaid Other | Source: Ambulatory Visit | Attending: Acute Care | Admitting: Acute Care

## 2021-05-11 ENCOUNTER — Other Ambulatory Visit: Payer: Self-pay

## 2021-05-11 DIAGNOSIS — R911 Solitary pulmonary nodule: Secondary | ICD-10-CM | POA: Insufficient documentation

## 2021-05-11 DIAGNOSIS — Z87891 Personal history of nicotine dependence: Secondary | ICD-10-CM | POA: Insufficient documentation

## 2021-05-21 ENCOUNTER — Encounter: Payer: Self-pay | Admitting: Pulmonary Disease

## 2021-05-21 ENCOUNTER — Ambulatory Visit (INDEPENDENT_AMBULATORY_CARE_PROVIDER_SITE_OTHER): Payer: Self-pay | Admitting: Pulmonary Disease

## 2021-05-21 ENCOUNTER — Other Ambulatory Visit: Payer: Self-pay

## 2021-05-21 VITALS — BP 112/70 | HR 61 | Temp 97.8°F | Ht 68.0 in | Wt 125.6 lb

## 2021-05-21 DIAGNOSIS — R911 Solitary pulmonary nodule: Secondary | ICD-10-CM

## 2021-05-21 DIAGNOSIS — J432 Centrilobular emphysema: Secondary | ICD-10-CM

## 2021-05-21 DIAGNOSIS — J42 Unspecified chronic bronchitis: Secondary | ICD-10-CM | POA: Insufficient documentation

## 2021-05-21 DIAGNOSIS — Z23 Encounter for immunization: Secondary | ICD-10-CM

## 2021-05-21 MED ORDER — STIOLTO RESPIMAT 2.5-2.5 MCG/ACT IN AERS
1.0000 | INHALATION_SPRAY | Freq: Two times a day (BID) | RESPIRATORY_TRACT | 11 refills | Status: DC
  Filled 2021-05-21 – 2021-09-15 (×2): qty 4, fill #0

## 2021-05-21 MED ORDER — STIOLTO RESPIMAT 2.5-2.5 MCG/ACT IN AERS: 2.0000 | INHALATION_SPRAY | Freq: Every day | RESPIRATORY_TRACT | 0 refills | Status: DC

## 2021-05-21 NOTE — Progress Notes (Signed)
Subjective:   PATIENT ID: Amber Livingston GENDER: female DOB: [birth year], MRN: [id number]   HPI  Chief Complaint  Patient presents with   Follow-up    Pt has had phlegm yellow/clear color, does not know what SOB feels like.    Reason for Visit: Follow-up with emphysema  [name] is a 60 year old female former smoker (46 pack years) with SVT, HLD, spinal stenosis and atrial mass who presents for follow-up  Synopsis:  2021-2022: Revamped her life and changed her diet and starting to see doctors. She has had shortness of breath that limits her activity including vacuuming in her home. She has other chronic issues including back pain and fibromyalgia that affect her activity.  She recently had a CT lung screen completed on 11/07/20 which demonstrated emphysema and small multiple pulmonary nodules. She quit smoking February 2022. She is still taking lozenges. She uses edibles but does not smoke marijuana. Her shortness of breath has worsened in last 9 months. No wheezing or cough. She is not on inhalers.  02/27/21 She is in active fibromyalgia flare and being seen by a pain doctor but not prescribed anything at this time. For her COPD, Spiriva one puff twice a day. She uses albuterol once a day which is improved. Reports shortness of breath improved, occurs with exertion. Denies coughing, wheezing. She reports pulse oximetry with SpO2 96%. She is walking 2-3 times a week for 15 minutes.  05/21/21 She recently had fibromyalgia flare over the weekend. She takes Spiriva ONE puff in the morning and evening. Also takes albuterol 3-4 times a day. She chronic cough with sputum production. Shortness of breath with exertion. Rare wheezing. Symptoms worsen with changes in humidity, weather. She can walk 1.4 miles which she does 2-3 times a week; this is more activity since we last visited.  Social History: Former smoker. 46 pack-year. 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     Cyanocobalamin (B-12) 2500 MCG TABS Take by mouth daily.     D-Ribose (RIBOSE, D,) POWD 2,500 mg by Does not apply route in the morning and at bedtime.     diphenhydramine-acetaminophen (TYLENOL PM) 25-500 MG TABS tablet Take 2 tablets by mouth at bedtime as needed.     DULoxetine (CYMBALTA) 30 MG capsule Take 1 capsule (30 mg total) by mouth once daily. 30 capsule 0   estradiol (ESTRACE) 0.5 MG tablet Take 1 tablet (0.5 mg total) by mouth once daily. 90 tablet 3   ibuprofen (ADVIL) 200 MG tablet Take 600 mg by mouth every 6 (six) hours as needed for moderate pain.     MAGNESIUM BISGLYCINATE PO Take 1,000 mg by mouth daily.     medroxyPROGESTERone (PROVERA) 2.5 MG tablet Take 1 tablet (2.5 mg total) by mouth once daily. 90 tablet 3   nicotine polacrilex (COMMIT) 4 MG lozenge Take 4 mg by mouth as needed for smoking cessation.     NON FORMULARY as needed. CBD gummies     PROAIR HFA 108 (90 Base) MCG/ACT inhaler INHALE  2 PUFFS BY MOUTH EVERY 6 HOURS AS NEEDED FOR WHEEZING OR SHORT OF BREATH 34 g 11   Tiotropium Bromide Monohydrate (SPIRIVA RESPIMAT) 2.5 MCG/ACT AERS Inhale 2 puffs into the lungs daily. 4 g 4   traZODone (DESYREL) 50 MG tablet TAKE 1/2 TABLET (25MG  TOTAL) BY MOUTH ONCE DAILY AT BEDTIME. 30 tablet 0   Turmeric (QC TUMERIC COMPLEX PO) Take 1,000 mg by mouth daily.     No facility-administered medications prior to visit.    Review of Systems  Constitutional:  Negative for chills, diaphoresis, fever, malaise/fatigue and weight loss.  HENT:  Negative for congestion.   Respiratory:  Positive for cough, shortness of breath and wheezing. Negative  for hemoptysis and sputum production.   Cardiovascular:  Negative for chest pain, palpitations and leg swelling.    Objective:   Vitals:   05/21/21 1133  BP: 112/70  Pulse: 61  Temp: 97.8 F (36.6 C)  TempSrc: Oral  SpO2: 100%  Weight: 125 lb 9.6 oz (57 kg)  Height: 5\' 8"  (1.727 m)   SpO2: 100 % O2 Device: None (Room air)  Physical Exam: General: Well-appearing, no acute distress HENT: Amber Livingston, AT Eyes: EOMI, no scleral icterus Respiratory: Clear to auscultation bilaterally.  No crackles, wheezing or rales Cardiovascular: RRR, -M/R/G, no JVD Extremities:-Edema,-tenderness Neuro: AAO x4, CNII-XII grossly intact Psych: Normal mood, normal affect  Data Reviewed:  Imaging: CT chest lung screening 11/06/2020-multiple small lung nodules with largest measuring 6.6 mm in the right lower lobe.  Background emphysema LDCT Screen 05/11/21 - Stable lung nodules. Emphysema  PFT: 02/27/21 FVC 3.54 (96%) FEV1 1.98 (69%) Ratio 65  TLC 112% DLCO 72% Interpretation: Moderate obstructive defect with mildly reduced gas exchanged. Normal lung volumes.  Labs: CBC    Component Value Date/Time   WBC 4.2 09/27/2020 1042   RBC 3.99 09/27/2020 1042   HGB 12.7 09/27/2020 1042   HCT 37.5 09/27/2020 1042   PLT 321 09/27/2020 1042   MCV 94 09/27/2020 1042   MCH 31.8 09/27/2020 1042   MCHC 33.9 09/27/2020 1042   RDW 12.5 09/27/2020 1042   LYMPHSABS 2.0 03/02/2020 1846   EOSABS 0.3 03/02/2020 1846   BASOSABS 0.1 03/02/2020 1846   Absolute eos 03/02/20 300    Assessment & Plan:   Discussion: 60 year old female former smoker with emphysema who presents for follow-up. Remains symptomatic on solo LAMA treatment. Will step-up and add LABA. Discussed clinical course and management of COPD including bronchodilator regimen and action plan for exacerbation.   Shortness of breath - persistent, unchanging Can be multi factorial in setting of emphysema, fibromyalgia, deconditioning.  Management as noted  below  Emphysema --STOP Spiriva --START Stiolto 2.5/2.5 mcg TWO puffs ONCE a day. Sample --CONTINUE Albuterol as needed for shortness of breath or wheezing --Discussed vaccinations. Administer influenza in-office today. Recommend scheduling 4th COVID vaccination.  Lung nodules --Reviewed CT scan. Addressed questions. Continue annual scan  Health Maintenance Immunization History  Administered Date(s) Administered   Influenza,inj,Quad PF,6+ Mos 05/21/2021   Moderna Sars-Covid-2 Vaccination 11/18/2019, 12/16/2019   CT Lung Screen - Due 04/2022  Orders Placed This Encounter  Procedures   Flu Vaccine QUAD 48mo+IM (Fluarix, Fluzone & Alfiuria Quad PF)    Meds ordered this encounter  Medications   Tiotropium Bromide-Olodaterol (STIOLTO RESPIMAT) 2.5-2.5 MCG/ACT AERS    Sig: Inhale 1 puff into the lungs in the morning and at bedtime.    Dispense:  4 g    Refill:  11   Tiotropium Bromide-Olodaterol (  STIOLTO RESPIMAT) 2.5-2.5 MCG/ACT AERS    Sig: Inhale 2 puffs into the lungs daily.    Dispense:  4 g    Refill:  0    Order Specific Question:   Lot Number?    Answer:   [id number]    Order Specific Question:   Expiration Date?    Answer:   03/19/2022    Order Specific Question:   Quantity    Answer:   2    Return in about 6 months (around 11/19/2021).  I have spent a total time of 32-minutes on the day of the appointment reviewing prior documentation, coordinating care and discussing medical diagnosis and plan with the patient/family. Past medical history, allergies, medications were reviewed. Pertinent imaging, labs and tests included in this note have been reviewed and interpreted independently by me.  [place], MD [facility] 05/21/2021 11:52 AM  Office Number [phone]

## 2021-05-21 NOTE — Patient Instructions (Signed)
Emphysema --STOP Spiriva --START Stiolto 2.5/2.5 mcg TWO puffs ONCE a day. Sample provided --CONTINUE Albuterol as needed for shortness of breath or wheezing --Discussed vaccinations. Administer influenza in-office today. Recommend scheduling 4th COVID vaccination.  Lung nodules --Continue annual lung screen  Follow-up with me in 6 months

## 2021-05-22 ENCOUNTER — Telehealth: Payer: Self-pay | Admitting: Cardiology

## 2021-05-22 ENCOUNTER — Other Ambulatory Visit: Payer: Self-pay | Admitting: Emergency Medicine

## 2021-05-22 NOTE — Telephone Encounter (Signed)
Nevin Bloodgood from the [facility] in [place] states they received a saff message to one of their NP's from Dr. Radford Pax. She says the message sent was requesting to schedule a CMRI. She says they are not sure if it was requesting them to schedule it or if our office would schedule this and the message was just to inform the patient will have the test. She states they do not schedule these tests. Phone: [phone]

## 2021-05-22 NOTE — Telephone Encounter (Signed)
Called and informed Amber Livingston that message was just an [place].  Testing will be handled by our office if needed.  She thanked me for returning her call.

## 2021-05-23 ENCOUNTER — Other Ambulatory Visit: Payer: Self-pay

## 2021-05-23 ENCOUNTER — Telehealth: Payer: Self-pay | Admitting: Pharmacist

## 2021-05-23 ENCOUNTER — Encounter: Payer: Self-pay | Admitting: Dermatology

## 2021-05-23 ENCOUNTER — Ambulatory Visit (INDEPENDENT_AMBULATORY_CARE_PROVIDER_SITE_OTHER): Payer: Self-pay | Admitting: Dermatology

## 2021-05-23 DIAGNOSIS — Z1283 Encounter for screening for malignant neoplasm of skin: Secondary | ICD-10-CM

## 2021-05-23 DIAGNOSIS — R234 Changes in skin texture: Secondary | ICD-10-CM

## 2021-05-23 DIAGNOSIS — D229 Melanocytic nevi, unspecified: Secondary | ICD-10-CM

## 2021-05-23 DIAGNOSIS — L821 Other seborrheic keratosis: Secondary | ICD-10-CM

## 2021-05-23 DIAGNOSIS — L309 Dermatitis, unspecified: Secondary | ICD-10-CM

## 2021-05-23 DIAGNOSIS — L578 Other skin changes due to chronic exposure to nonionizing radiation: Secondary | ICD-10-CM

## 2021-05-23 DIAGNOSIS — L814 Other melanin hyperpigmentation: Secondary | ICD-10-CM

## 2021-05-23 DIAGNOSIS — L700 Acne vulgaris: Secondary | ICD-10-CM

## 2021-05-23 DIAGNOSIS — D1800 Hemangioma unspecified site: Secondary | ICD-10-CM

## 2021-05-23 DIAGNOSIS — L820 Inflamed seborrheic keratosis: Secondary | ICD-10-CM

## 2021-05-23 MED ORDER — MOMETASONE FUROATE 0.1 % EX OINT
TOPICAL_OINTMENT | CUTANEOUS | 1 refills | Status: DC
  Filled 2021-05-23: qty 45, 30d supply, fill #0

## 2021-05-23 NOTE — Telephone Encounter (Signed)
--   Elmer Picker - Wednesday, May 23, 2021 2:24 PM --Patient in our office today, she brought in the invoice were she recieved Spiriva invoice dated 04/26/2021--PATIENT Amber Livingston, Amber Livingston.  I have received a pharmacy printout for Stioleto Respimat 2.16mcg Inhale 1 puff into the lungs in the morning and at bedtime. Got patient to sign her portion while in the office--sending provider Dr. Darlis Loan [facility] forms to sign & return.

## 2021-05-23 NOTE — Patient Instructions (Addendum)
If you have any questions or concerns for your doctor, please call our main line at [phone] and press option 4 to reach your doctor's medical assistant. If no one answers, please leave a voicemail as directed and we will return your call as soon as possible. Messages left after 4 pm will be answered the following business day.   You may also send us a message via MyChart. We typically respond to MyChart messages within 1-2 business days.  For prescription refills, please ask your pharmacy to contact our office. Our fax number is [phone].  If you have an urgent issue when the clinic is closed that cannot wait until the next business day, you can page your doctor at the number below.    Please note that while we do our best to be available for urgent issues outside of office hours, we are not available 24/7.   If you have an urgent issue and are unable to reach us, you may choose to seek medical care at your doctor's office, retail clinic, urgent care center, or emergency room.  If you have a medical emergency, please immediately call 911 or go to the emergency department.  Pager Numbers  - Dr. Kowalski: [phone]  - Dr. Moye: [phone]  - Dr. Stewart: [phone]  In the event of inclement weather, please call our main line at [phone] for an update on the status of any delays or closures.  Dermatology Medication Tips: Please keep the boxes that topical medications come in in order to help keep track of the instructions about where and how to use these. Pharmacies typically print the medication instructions only on the boxes and not directly on the medication tubes.   If your medication is too expensive, please contact our office at [phone] option 4 or send us a message through MyChart.   We are unable to tell what your co-pay for medications will be in advance as this is different depending on your insurance coverage. However, we may be able to find a substitute  medication at lower cost or fill out paperwork to get insurance to cover a needed medication.   If a prior authorization is required to get your medication covered by your insurance company, please allow us 1-2 business days to complete this process.  Drug prices often vary depending on where the prescription is filled and some pharmacies may offer cheaper prices.  The website www.goodrx.com contains coupons for medications through different pharmacies. The prices here do not account for what the cost may be with help from insurance (it may be cheaper with your insurance), but the website can give you the price if you did not use any insurance.  - You can print the associated coupon and take it with your prescription to the pharmacy.  - You may also stop by our office during regular business hours and pick up a GoodRx coupon card.  - If you need your prescription sent electronically to a different pharmacy, notify our office through [facility] MyChart or by phone at [phone] option 4.  Instructions for [facility] Medications  One or more of your medications was sent to the [facility] mail order compounding pharmacy. You will receive an email from them and can purchase the medicine through that link. It will then be mailed to your home at the address you confirmed. If for any reason you do not receive an email from them, please check your spam folder. If you still do not find the email,   please let us know. [facility] phone number is [phone].   

## 2021-05-23 NOTE — Progress Notes (Signed)
New Patient Visit  Subjective  Amber Livingston is a 60 y.o. female who presents for the following: Total body skin exam (Check spots chest, L arm) and cuticles cracking (2 yrs, using moisturizer).  Patient has history of acne with scarring.  She still has some areas of acne.  She has several areas to be evaluated today.  New patient referral from Amber Livingston, Amber Asp, NP  The following portions of the chart were reviewed this encounter and updated as appropriate:   Tobacco  Allergies  Meds  Problems  Med Hx  Surg Hx  Fam Hx     Review of Systems:  No other skin or systemic complaints except as noted in HPI or Assessment and Plan.  Objective  Well appearing patient in no apparent distress; mood and affect are within normal limits.  A full examination was performed including scalp, head, eyes, ears, nose, lips, neck, chest, axillae, abdomen, back, buttocks, bilateral upper extremities, bilateral lower extremities, hands, feet, fingers, toes, fingernails, and toenails. All findings within normal limits unless otherwise noted below.  central chest x 1, L bicep x 1,Total = 2 Erythematous keratotic or waxy stuck-on papule or plaque.   face Closed comedones face, scarring face  bil hands Fissures and scale fingers   Assessment & Plan   Lentigines - Scattered tan macules - Due to sun exposure - Benign-appearing, observe - Recommend daily broad spectrum sunscreen SPF 30+ to sun-exposed areas, reapply every 2 hours as needed. - Call for any changes  Seborrheic Keratoses - Stuck-on, waxy, tan-brown papules and/or plaques  - Benign-appearing - Discussed benign etiology and prognosis. - Observe - Call for any changes  Melanocytic Nevi - Tan-brown and/or pink-flesh-colored symmetric macules and papules - Benign appearing on exam today - Observation - Call Livingston for new or changing moles - Recommend daily use of broad spectrum spf 30+ sunscreen to sun-exposed  areas.   Hemangiomas - Red papules - Discussed benign nature - Observe - Call for any changes  Actinic Damage - Chronic condition, secondary to cumulative UV/sun exposure - diffuse scaly erythematous macules with underlying dyspigmentation - Recommend daily broad spectrum sunscreen SPF 30+ to sun-exposed areas, reapply every 2 hours as needed.  - Staying in the shade or wearing long sleeves, sun glasses (UVA+UVB protection) and wide brim hats (4-inch brim around the entire circumference of the hat) are also recommended for sun protection.  - Call for new or changing lesions.  Skin cancer screening performed today.  Inflamed seborrheic keratosis central chest x 1, L bicep x 1,Total = 2  Destruction of lesion - central chest x 1, L bicep x 1,Total = 2 Complexity: simple   Destruction method: cryotherapy   Informed consent: discussed and consent obtained   Timeout:  patient name, date of birth, surgical site, and procedure verified Lesion destroyed using liquid nitrogen: Yes   Region frozen until ice ball extended beyond lesion: Yes   Outcome: patient tolerated procedure well with no complications   Post-procedure details: wound care instructions given    Acne vulgaris face Chronic, persistent Discussed laser txt for the scarring  Start Skin Medicinals Tretinoin: 0.025%, Niacinamide: 2% cr qhs to face  Topical retinoid medications like tretinoin/Retin-A, adapalene/Differin, tazarotene/Fabior, and Epiduo/Epiduo Forte can cause dryness and irritation when first started. Only apply a pea-sized amount to the entire affected area. Avoid applying it around the eyes, edges of mouth and creases at the nose. If you experience irritation, use a good moisturizer first and/or  apply the medicine less often. If you are doing well with the medicine, you can increase how often you use it until you are applying every night. Be careful with sun protection while using this medication as it can make  you sensitive to the sun. This medicine should not be used by pregnant women.    Hand dermatitis with fissures of fingertips bil hands Start Mometasone oint qhs up to 5d/wk until clear, then prn flares Will plan Eucrisa or Opzelura on f/u  Topical steroids (such as triamcinolone, fluocinolone, fluocinonide, mometasone, clobetasol, halobetasol, betamethasone, hydrocortisone) can cause thinning and lightening of the skin if they are used for too long in the same area. Your physician has selected the right strength medicine for your problem and area affected on the body. Please use your medication only as directed by your physician to prevent side effects.    mometasone (ELOCON) 0.1 % ointment - bil hands Apply topically as directed. Qhs to aa fingers hands up to 5 days a week until clear, then prn flares  Skin cancer screening  Return in about 4 months (around 09/23/2021) for f/u acne, hand derm, ISk.  I, Sonya Hupman, RMA, am acting as scribe for Sarina Ser, MD . Documentation: I have reviewed the above documentation for accuracy and completeness, and I agree with the above.  Sarina Ser, MD

## 2021-05-24 ENCOUNTER — Other Ambulatory Visit: Payer: Self-pay

## 2021-05-24 ENCOUNTER — Ambulatory Visit: Payer: Self-pay | Admitting: Licensed Clinical Social Worker

## 2021-05-24 DIAGNOSIS — F339 Major depressive disorder, recurrent, unspecified: Secondary | ICD-10-CM

## 2021-05-24 DIAGNOSIS — F411 Generalized anxiety disorder: Secondary | ICD-10-CM
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
## 2021-05-31 ENCOUNTER — Other Ambulatory Visit: Payer: Self-pay | Admitting: *Deleted

## 2021-05-31 ENCOUNTER — Encounter: Payer: Self-pay | Admitting: *Deleted

## 2021-05-31 DIAGNOSIS — Z87891 Personal history of nicotine dependence: Secondary | ICD-10-CM

## 2021-05-31 NOTE — Progress Notes (Signed)
Cardiology Office Note:    Date:  06/01/2021   ID:  Amber Livingston, DOB [birth year], MRN [id number]  PCP:  Langston Reusing, NP  [facility] Cardiologist:  Werner Lean, MD  [facility] Electrophysiologist:  None   CC: Shortness of breath  History of Present Illness:    Amber Livingston is a 60 y.o. female with a hx of Uterine Ablation, Cannabis Abuse, and HLD who presents for evaluation 08/26/19.  In interim of this visit, patient had echo with possible atrial mass; MR suggestive of lipomatous hypertrophy.  Negative Stress test.  In interval, repeat echo shows persistent interatrial changes.    05/11/21.  Internal CT looked ok.  Seen 06/01/21.  Patient notes that she is doing [place].  Since last visit has made significant change to her diet and lifestyle.   There are no interval hospital/ED visit.    No chest pain or pressure .  No SOB/DOE and no PND/Orthopnea.  No weight gain or leg swelling.  No palpitations or syncope.  Does note abdominal pain in the evenings and wanted to know if this could be related to her aortic atherosclerosis.    Past Medical History:  Diagnosis Date   Arthritis    Cervical spinal stenosis    Emphysema, unspecified (HCC)    Fibromyalgia    Stage 7   Lumbar stenosis    Uterine fibroid     Past Surgical History:  Procedure Laterality Date   ablation     APPENDECTOMY     DILATION AND CURETTAGE OF UTERUS     x3 for miscarriage   LAPAROSCOPY     Fibroid removal   PALPITATION     TONSILLECTOMY      Current Medications: Current Meds  Medication Sig   BEE POLLEN PO Take 1 capsule by mouth daily.   Cyanocobalamin (B-12) 2500 MCG TABS Take by mouth daily.   D-Ribose (RIBOSE, D,) POWD 2,500 mg by Does not apply route in the morning and at bedtime.   diphenhydramine-acetaminophen (TYLENOL PM) 25-500 MG TABS tablet Take 2 tablets by mouth at bedtime as needed.   DULoxetine (CYMBALTA) 30 MG capsule Take 1 capsule (30 mg total) by mouth once daily.    estradiol (ESTRACE) 0.5 MG tablet Take 1 tablet (0.5 mg total) by mouth once daily.   ibuprofen (ADVIL) 200 MG tablet Take 600 mg by mouth every 6 (six) hours as needed for moderate pain.   MAGNESIUM BISGLYCINATE PO Take 1,000 mg by mouth daily.   medroxyPROGESTERone (PROVERA) 2.5 MG tablet Take 1 tablet (2.5 mg total) by mouth once daily.   mometasone (ELOCON) 0.1 % ointment Apply topically as directed. Qhs to aa fingers hands up to 5 days a week until clear, then prn flares   nicotine polacrilex (COMMIT) 4 MG lozenge Take 4 mg by mouth as needed for smoking cessation.   NON FORMULARY as needed. CBD gummies   PROAIR HFA 108 (90 Base) MCG/ACT inhaler INHALE 2 PUFFS BY MOUTH EVERY 6 HOURS AS NEEDED FOR WHEEZING OR SHORT OF BREATH   Tiotropium Bromide-Olodaterol (STIOLTO RESPIMAT) 2.5-2.5 MCG/ACT AERS Inhale 1 puff into the lungs in the morning and at bedtime.   Tiotropium Bromide-Olodaterol (STIOLTO RESPIMAT) 2.5-2.5 MCG/ACT AERS Inhale 2 puffs into the lungs daily.   traZODone (DESYREL) 50 MG tablet TAKE 1/2 TABLET (25MG  TOTAL) BY MOUTH ONCE DAILY AT BEDTIME.   Turmeric (QC TUMERIC COMPLEX PO) Take 1,000 mg by mouth daily.     Allergies:   Patient  has no known allergies.   Social History   Socioeconomic History   Marital status: Married    Spouse name: Not on file   Number of children: Not on file   Years of education: Not on file   Highest education level: Not on file  Occupational History   Not on file  Tobacco Use   Smoking status: Former    Packs/day: 1.00    Years: 46.00    Pack years: 46.00    Types: Cigarettes    Quit date: 10/04/2020    Years since quitting: 0.6   Smokeless tobacco: Never   Tobacco comments:    verified 01/17/2021  Vaping Use   Vaping Use: Never used  Substance and Sexual Activity   Alcohol use: Not Currently   Drug use: Not Currently    Frequency: 7.0 times per week    Types: Marijuana    Comment: once daily, last use 10/2020   Sexual activity:  Not Currently    Partners: Male  Other Topics Concern   Not on file  Social History Narrative   Not on file   Social Determinants of Health   Financial Resource Strain: Not on file  Food Insecurity: No Food Insecurity   Worried About [place] in the Last Year: Never true   [place] in the Last Year: Never true  Transportation Needs: No Transportation Needs   Lack of Transportation (Medical): No   Lack of Transportation (Non-Medical): No  Physical Activity: Not on file  Stress: Not on file  Social Connections: Not on file     Family History: The patient's family history includes Alcohol abuse in her maternal grandmother; Brain cancer in her mother; Breast cancer (age of onset: 26) in her paternal grandmother; Dementia in her father, paternal grandfather, and paternal uncle; Hypertension in her mother; Lung cancer in her mother; Prostate cancer in her father; Uterine cancer (age of onset: 24) in her mother.  ROS:   Please see the history of present illness.     All other systems reviewed and are negative.  EKGs/Labs/Other Studies Reviewed:    The following studies were reviewed today:  EKG:   08/25/20: SR 64 WNL 07/06/2020: Sinus 65 WNL  CMR: Date: 10/25/20 Results: IMPRESSION: 1. Increased signal on dark blood imaging consistent with lipomatous hypertrophy of the interatrial septum. Reasonable for repeating imaging follow up (echocardiogram or CMR) in 6 months to confirm no change in interval.  Transthoracic Echocardiogram: Date: 09/21/20 Results:  IMPRESSIONS   1. Left ventricular ejection fraction, by estimation, is 60 to 65%. The  left ventricle has normal function. The left ventricle has no regional  wall motion abnormalities. Left ventricular diastolic parameters were  normal.   2. Right ventricular systolic function is normal. The right ventricular  size is normal.   3. There is a prominent projection of the intra-atrial septum into the  right  atrium, best seen in image 35.   4. The mitral valve is normal in structure. Mild mitral valve  regurgitation. No evidence of mitral stenosis.   5. The aortic valve is tricuspid. Aortic valve regurgitation is not  visualized. No aortic stenosis is present.    ECG Stress Testing : Date: 09/21/20 Results: Exercise time: 6:37 min Workload: 8 METS, average exercise capacity Test stopped due to: fatigue, sob, patient request BP response: normal  HR response: blunted HR response (peak HR 130 - 80% max HR) Rhythm: SR   Suboptimal HR response to  exercise. No ischemia on stress ECG at suboptimal peak heart rate, which may reduce the sensitivity of this study to detect ischemia.   Cardiac Event Monitoring Date: 09/25/20 Results: Patient had a minimum heart rate of 39 bpm, maximum heart rate of 160 bpm, and average heart rate of 66 bpm. Predominant underlying rhythm was sinus rhythm. Four runs of supraventricular tachycardia occurred lasting 16 beats at longest with a max rate of 160 bpm at fastest. Isolated PACs were rare (<1.0%), with rare couplets present. Isolated PVCs were rare (<1.0%), with rare couplets and bigeminy present. No evidence of complete heart block. Triggered and diary events associated with sinus rhythm, one episodes of SVT, and rare PVC.   Occasionally symptomatic SVT.  Recent Labs: 09/27/2020: BUN 11; Creatinine, Ser 0.75; Hemoglobin 12.7; Platelets 321; Potassium 5.0; Sodium 141  Recent Lipid Panel    Component Value Date/Time   CHOL 208 (H) 07/05/2020 1031   TRIG 89 07/05/2020 1031   HDL 60 07/05/2020 1031   CHOLHDL 3.5 07/05/2020 1031   LDLCALC 132 (H) 07/05/2020 1031   Risk Assessment/Calculations:     ASCVD Risk 4.2%  Physical Exam:    VS:  BP 130/82   Pulse 73   Ht 5\' 8"  (1.727 m)   Wt 126 lb 3.2 oz (57.2 kg)   SpO2 97%   BMI 19.19 kg/m     Wt Readings from Last 3 Encounters:  06/01/21 126 lb 3.2 oz (57.2 kg)  05/21/21 125 lb 9.6 oz (57 kg)   03/05/21 123 lb 8 oz (56 kg)    GEN:  Well nourished, well developed in no acute distress HEENT: Normal NECK: No JVD LYMPHATICS: No lymphadenopathy CARDIAC: RRR, no murmurs, rubs, gallops RESPIRATORY:  Clear to auscultation without rales, wheezing or rhonchi  ABDOMEN: Soft, non-tender, non-distended MUSCULOSKELETAL:  No edema; No deformity  SKIN: Warm and dry NEUROLOGIC:  Alert and oriented x 3 PSYCHIATRIC:  Normal affect   ASSESSMENT:    1. Atrial mass   2. Mixed hyperlipidemia   3. SVT (supraventricular tachycardia) ([place])   4. Aortic atherosclerosis ([place])     PLAN:    In order of problems listed above:  Interatrial mass: - reviewed all prior imaging - I suspect this is lipomatous hypertrophy of the interatrial septum - given her risk factors and interval echo, will repeat MRI with mass protocol:  mass protocol (T1 weighted dark blood with and without fat sat, T2weighted dark blood, T2 STIR , first pass perfusion, LGE).     Hyperlipidemia (mixed) Aortic Atherosclerosis - reviewed CT with patient - checking lipids today; may start medication  Cannabis Abuse, Tobacco use, and distant substance abuse - working toward cessation   Hx of SVT - with resting bradycardia - would defer medication at this time  Six month follow up unless new symptoms or abnormal test results warranting change in plan   Medication Adjustments/Labs and Tests Ordered: Current medicines are reviewed at length with the patient today.  Concerns regarding medicines are outlined above.  Orders Placed This Encounter  Procedures   MR CARDIAC MORPHOLOGY W WO CONTRAST   Lipid panel    No orders of the defined types were placed in this encounter.   Patient Instructions  Medication Instructions:  Your physician recommends that you continue on your current medications as directed. Please refer to the Current Medication list given to you today.  *If you need a refill on your cardiac medications  before your next appointment, please call your pharmacy*  Lab Work: TODAY: FLP If you have labs (blood work) drawn today and your tests are completely normal, you will receive your results only by: [place] (if you have MyChart) OR A paper copy in the mail If you have any lab test that is abnormal or we need to change your treatment, we will call you to review the results.   Testing/Procedures: Your physician has requested that you have a Cardiac MRI.    Follow-Up: At [facility], you and your health needs are our priority.  As part of our continuing mission to provide you with exceptional heart care, we have created designated Provider Care Teams.  These Care Teams include your primary Cardiologist (physician) and Advanced Practice Providers (APPs -  Physician Assistants and Nurse Practitioners) who all work together to provide you with the care you need, when you need it.   Your next appointment:   6 month(s)  The format for your next appointment:   In Person  Provider:   You may see Werner Lean, MD or one of the following Advanced Practice Providers on your designated Care Team:   Melina Copa, PA-C Ermalinda Barrios, PA-C   Other Instructions   You are scheduled for Cardiac MRI on ___TBD___________. Please arrive at the [facility] main entrance of [facility] at ________TBD________ (30-45 minutes prior to test start time). ?  [facility] [address] [place], [place] [address] [phone]  Please take advantage of the free valet parking available at the MAIN entrance (A entrance). Proceed to the [facility] Radiology Department (First Floor). ? Magnetic resonance imaging (MRI) is a painless test that produces images of the inside of the body without using Xrays.  During an MRI, strong magnets and radio waves work together in a Research officer, political party to form detailed images.   MRI images may provide more details about a medical  condition than X-rays, CT scans, and ultrasounds can provide.  You may be given earphones to listen for instructions.  You may eat a light breakfast and take medications as ordered with the exception of HCTZ (fluid pill, other). Please avoid stimulants for 12 hr prior to test. (Ie. Caffeine, nicotine, chocolate, or antihistamine medications)  If a contrast material will be used, an IV will be inserted into one of your veins. Contrast material will be injected into your IV. It will leave your body through your urine within a day. You may be told to drink plenty of fluids to help flush the contrast material out of your system.  You will be asked to remove all metal, including: Watch, jewelry, and other metal objects including hearing aids, hair pieces and dentures. Also wearable glucose monitoring systems (ie. Freestyle Libre and Omnipods) (Braces and fillings normally are not a problem.)   TEST WILL TAKE APPROXIMATELY 1 HOUR  PLEASE NOTIFY SCHEDULING AT LEAST 24 HOURS IN ADVANCE IF YOU ARE UNABLE TO KEEP YOUR APPOINTMENT. [phone]  Please call Marchia Bond, cardiac imaging nurse navigator with any questions/concerns. Marchia Bond RN Navigator Cardiac Imaging Gordy Clement RN Navigator Cardiac Imaging [facility] [phone] Office      Signed, Werner Lean, MD  06/01/2021 10:34 AM     [facility]

## 2021-06-01 ENCOUNTER — Other Ambulatory Visit: Payer: Self-pay | Admitting: Gerontology

## 2021-06-01 ENCOUNTER — Encounter: Payer: Self-pay | Admitting: Internal Medicine

## 2021-06-01 ENCOUNTER — Other Ambulatory Visit: Payer: Self-pay

## 2021-06-01 ENCOUNTER — Ambulatory Visit (INDEPENDENT_AMBULATORY_CARE_PROVIDER_SITE_OTHER): Payer: Self-pay | Admitting: Internal Medicine

## 2021-06-01 VITALS — BP 130/82 | HR 73 | Ht 68.0 in | Wt 126.2 lb

## 2021-06-01 DIAGNOSIS — F341 Dysthymic disorder: Secondary | ICD-10-CM

## 2021-06-01 DIAGNOSIS — I700 Atherosclerosis of aorta: Secondary | ICD-10-CM

## 2021-06-01 DIAGNOSIS — I5189 Other ill-defined heart diseases: Secondary | ICD-10-CM

## 2021-06-01 DIAGNOSIS — I471 Supraventricular tachycardia: Secondary | ICD-10-CM

## 2021-06-01 DIAGNOSIS — E782 Mixed hyperlipidemia: Secondary | ICD-10-CM

## 2021-06-01 LAB — LIPID PANEL
Chol/HDL Ratio: 3.3 ratio (ref 0.0–4.4)
Cholesterol, Total: 224 mg/dL — ABNORMAL HIGH (ref 100–199)
HDL: 67 mg/dL (ref >39)
LDL Chol Calc (NIH): 144 mg/dL — ABNORMAL HIGH (ref 0–99)
Triglycerides: 77 mg/dL (ref 0–149)
VLDL Cholesterol Cal: 13 mg/dL (ref 5–40)

## 2021-06-01 MED FILL — Trazodone HCl Tab 50 MG: ORAL | 30 days supply | Qty: 15 | Fill #1 | Status: AC

## 2021-06-01 NOTE — Patient Instructions (Signed)
Medication Instructions:  Your physician recommends that you continue on your current medications as directed. Please refer to the Current Medication list given to you today.  *If you need a refill on your cardiac medications before your next appointment, please call your pharmacy*   Lab Work: TODAY: FLP If you have labs (blood work) drawn today and your tests are completely normal, you will receive your results only by: [place] (if you have MyChart) OR A paper copy in the mail If you have any lab test that is abnormal or we need to change your treatment, we will call you to review the results.   Testing/Procedures: Your physician has requested that you have a Cardiac MRI.    Follow-Up: At [facility], you and your health needs are our priority.  As part of our continuing mission to provide you with exceptional heart care, we have created designated Provider Care Teams.  These Care Teams include your primary Cardiologist (physician) and Advanced Practice Providers (APPs -  Physician Assistants and Nurse Practitioners) who all work together to provide you with the care you need, when you need it.   Your next appointment:   6 month(s)  The format for your next appointment:   In Person  Provider:   You may see Werner Lean, MD or one of the following Advanced Practice Providers on your designated Care Team:   Melina Copa, PA-C Ermalinda Barrios, PA-C   Other Instructions   You are scheduled for Cardiac MRI on ___TBD___________. Please arrive at the [facility] main entrance of [facility] at ________TBD________ (30-45 minutes prior to test start time). ?  [facility] [address] [place], [place] [address] [phone]  Please take advantage of the free valet parking available at the MAIN entrance (A entrance). Proceed to the [facility] Radiology Department (First Floor). ? Magnetic resonance imaging (MRI) is a painless test that  produces images of the inside of the body without using Xrays.  During an MRI, strong magnets and radio waves work together in a Research officer, political party to form detailed images.   MRI images may provide more details about a medical condition than X-rays, CT scans, and ultrasounds can provide.  You may be given earphones to listen for instructions.  You may eat a light breakfast and take medications as ordered with the exception of HCTZ (fluid pill, other). Please avoid stimulants for 12 hr prior to test. (Ie. Caffeine, nicotine, chocolate, or antihistamine medications)  If a contrast material will be used, an IV will be inserted into one of your veins. Contrast material will be injected into your IV. It will leave your body through your urine within a day. You may be told to drink plenty of fluids to help flush the contrast material out of your system.  You will be asked to remove all metal, including: Watch, jewelry, and other metal objects including hearing aids, hair pieces and dentures. Also wearable glucose monitoring systems (ie. Freestyle Libre and Omnipods) (Braces and fillings normally are not a problem.)   TEST WILL TAKE APPROXIMATELY 1 HOUR  PLEASE NOTIFY SCHEDULING AT LEAST 24 HOURS IN ADVANCE IF YOU ARE UNABLE TO KEEP YOUR APPOINTMENT. [phone]  Please call Marchia Bond, cardiac imaging nurse navigator with any questions/concerns. Marchia Bond RN Navigator Cardiac Imaging Gordy Clement RN Navigator Cardiac Imaging [facility] [phone] Office

## 2021-06-04 ENCOUNTER — Other Ambulatory Visit: Payer: Self-pay

## 2021-06-04 ENCOUNTER — Telehealth: Payer: Self-pay

## 2021-06-04 DIAGNOSIS — I700 Atherosclerosis of aorta: Secondary | ICD-10-CM

## 2021-06-04 DIAGNOSIS — E782 Mixed hyperlipidemia: Secondary | ICD-10-CM

## 2021-06-04 MED ORDER — ROSUVASTATIN CALCIUM 10 MG PO TABS
10.0000 mg | ORAL_TABLET | Freq: Every day | ORAL | 3 refills | Status: DC
  Filled 2021-06-04: qty 90, 90d supply, fill #0

## 2021-06-04 NOTE — Telephone Encounter (Signed)
-----   Message from Werner Lean, MD sent at 06/04/2021 11:39 AM EDT ----- Results: LDL 144 is above goal Plan: Rosuvastatin 10 mg PO daily and lipids/ALT in three months  Werner Lean, MD

## 2021-06-04 NOTE — Telephone Encounter (Signed)
Called pt reviewed results and MD recommendations.  She is agreeable to plan orders placed. Pt had no questions or concerns.

## 2021-06-05 ENCOUNTER — Other Ambulatory Visit: Payer: Self-pay | Admitting: Gerontology

## 2021-06-05 ENCOUNTER — Other Ambulatory Visit: Payer: Self-pay

## 2021-06-05 ENCOUNTER — Telehealth: Payer: Self-pay | Admitting: Licensed Clinical Social Worker

## 2021-06-05 DIAGNOSIS — F341 Dysthymic disorder: Secondary | ICD-10-CM

## 2021-06-05 MED ORDER — DULOXETINE HCL 30 MG PO CPEP
30.0000 mg | ORAL_CAPSULE | Freq: Every day | ORAL | 0 refills | Status: DC
Start: 2021-06-05 — End: 2021-06-05
  Filled 2021-06-05: qty 30, 30d supply, fill #0

## 2021-06-05 MED ORDER — DULOXETINE HCL 30 MG PO CPEP
30.0000 mg | ORAL_CAPSULE | Freq: Every day | ORAL | 1 refills | Status: DC
Start: 2021-06-05 — End: 2021-07-27
  Filled 2021-06-05: qty 30, 30d supply, fill #0
  Filled 2021-07-03: qty 30, 30d supply, fill #1

## 2021-06-05 NOTE — Telephone Encounter (Signed)
Confirmed patient's appt for tomorrow afternoon and she expressed concern over her Cymbalta refill. She says she called to refill it last Thursday and hasn't heard anything. She's very concerned.

## 2021-06-06 ENCOUNTER — Ambulatory Visit: Payer: Self-pay | Admitting: Licensed Clinical Social Worker

## 2021-06-06 ENCOUNTER — Other Ambulatory Visit: Payer: Self-pay

## 2021-06-06 DIAGNOSIS — F339 Major depressive disorder, recurrent, unspecified: Secondary | ICD-10-CM

## 2021-06-06 DIAGNOSIS — F451 Undifferentiated somatoform disorder: Secondary | ICD-10-CM

## 2021-06-06 DIAGNOSIS — F411 Generalized anxiety disorder: Secondary | ICD-10-CM

## 2021-06-06 NOTE — BH Specialist Note (Signed)
Integrated Behavioral Health Follow Up Telephone Visit  MRN: [id number] Name: Amber Livingston  Total time: 60 minutes  Types of Service: Telephone visit Patient consents to telephone visit and 2 patient identifiers were used to identify patient   Interpretor:No. Interpretor Name and Language: N/A  Subjective: Amber Livingston is a 60 y.o. female accompanied by  herself Patient was referred by Amber Chioma, NP for Mental Health. Patient reports the following symptoms/concerns: The patient reports that she has been doing well overall since her last follow-up appointment. She noted that she had put in a request for a medication refill for her Cymbalta and Medication management had not processed it. She asked if the clinician could see if there was any issues in getting her medication refilled. Dynasia noted that the weather changes have been hard for her to deal with and feels that it has triggered a fibromyalgia flare. She explained that she has spent several days in bed, but feels she bounces back quicker since starting the Cymbalta. Anthonia discussed other health and financial stressor sin her life. She stated that she is working towards her goals and fells less stuck. Shiri denied any suicidal or homicidal thoughts. Duration of problem: Years; Severity of problem: moderate  Objective: Mood: Euthymic and Affect: Appropriate Risk of harm to self or others: No plan to harm self or others  Life Context: Family and Social: see above School/Work: see above Self-Care: see above Life Changes: see above  Patient and/or Family's Strengths/Protective Factors: Concrete supports in place (healthy food, safe environments, etc.)  Goals Addressed: Patient will:  Reduce symptoms of: agitation, anxiety, depression, insomnia, and stress   Increase knowledge and/or ability of: coping skills, healthy habits, self-management skills, and stress reduction   Demonstrate ability to: Increase healthy adjustment to current  life circumstances, Increase adequate support systems for patient/family, and Decrease self-medicating behaviors  Progress towards Goals: Ongoing  Interventions: Interventions utilized:  CBT Cognitive Behavioral Therapy and Sleep Hygiene was utilized by the clinician during today's follow up session. Clinician met with patient to identify needs related to stressors and functioning, and assess and monitor for signs and symptoms of anxiety and depression, and assess safety. The clinician processed with the patient how they have been doing since the last follow-up session.Clinician encouraged the patient to continue to practice self care and using her coping skills to deal with her current life circumstances, Clinician offered to message the patient's primary care provider regarding her medication refill request.  Standardized Assessments completed: GAD-7 and PHQ 9 PHQ-9 =   6 GAD-7 = 14  Assessment: Patient currently experiencing see above.   Patient may benefit from see above.  Plan: Follow up with behavioral health clinician on : patient will call to schedule Behavioral recommendations:  Referral(s): [facility] (In Clinic) "From scale of 1-10, how likely are you to follow plan?":    J , LCSWA   

## 2021-06-08 ENCOUNTER — Other Ambulatory Visit: Payer: Self-pay

## 2021-06-11 ENCOUNTER — Ambulatory Visit: Payer: Self-pay | Admitting: Dermatology

## 2021-06-11 ENCOUNTER — Telehealth: Payer: Self-pay | Admitting: Pharmacy Technician

## 2021-06-11 NOTE — Telephone Encounter (Signed)
Patient inquiring as to when Stiolto Respimat will be delivered.  Made patient aware that [facility] has not received signed portion of PAP application from Dr. Margaretha Seeds.  Patient to contact provider and ask to sign PAP application.  [facility]

## 2021-06-12 ENCOUNTER — Telehealth: Payer: Self-pay | Admitting: Pharmacist

## 2021-06-12 NOTE — Telephone Encounter (Signed)
faxed to [facility] - Tuesday, June 12, 2021 8:16 AM --Faxed [employer] renewal for Stiolto Respimat 2.45mcg Inhale 1 puff into the lungs two times a day. Patient also signed letter for meds to be shipped to [place].

## 2021-06-26 ENCOUNTER — Telehealth: Payer: Self-pay | Admitting: Pharmacist

## 2021-06-26 NOTE — Telephone Encounter (Signed)
--   Amber Livingston - Tuesday, June 26, 2021 2:07 PM --Faxed script to [facility] for refill on ProAir.

## 2021-07-02 ENCOUNTER — Other Ambulatory Visit: Payer: Self-pay

## 2021-07-02 ENCOUNTER — Telehealth (HOSPITAL_COMMUNITY): Payer: Self-pay | Admitting: *Deleted

## 2021-07-02 ENCOUNTER — Ambulatory Visit (HOSPITAL_COMMUNITY): Payer: Self-pay

## 2021-07-02 NOTE — Telephone Encounter (Signed)
Reaching out to patient to offer assistance regarding upcoming cardiac imaging study; pt verbalizes understanding of appt date/time, parking situation and where to check in, and verified current allergies; name and call back number provided for further questions should they arise  Zamya Culhane RN Navigator Cardiac Imaging [facility] [phone] office [phone] cell  

## 2021-07-04 ENCOUNTER — Ambulatory Visit (HOSPITAL_BASED_OUTPATIENT_CLINIC_OR_DEPARTMENT_OTHER): Payer: Self-pay | Admitting: Student in an Organized Health Care Education/Training Program

## 2021-07-04 ENCOUNTER — Ambulatory Visit (HOSPITAL_COMMUNITY)
Admission: RE | Admit: 2021-07-04 | Discharge: 2021-07-04 | Disposition: A | Discharge status: Home / Self Care | Payer: Medicaid Other | Source: Ambulatory Visit | Attending: Internal Medicine | Admitting: Internal Medicine

## 2021-07-04 ENCOUNTER — Other Ambulatory Visit: Payer: Self-pay

## 2021-07-04 DIAGNOSIS — I5189 Other ill-defined heart diseases: Secondary | ICD-10-CM | POA: Diagnosis present

## 2021-07-04 DIAGNOSIS — E782 Mixed hyperlipidemia: Secondary | ICD-10-CM | POA: Insufficient documentation

## 2021-07-04 DIAGNOSIS — M797 Fibromyalgia: Secondary | ICD-10-CM

## 2021-07-04 MED ORDER — GADOBUTROL 1 MMOL/ML IV SOLN
11.0000 mL | Freq: Once | INTRAVENOUS | Status: AC | PRN
  Administered 2021-07-04: 11 mL via INTRAVENOUS

## 2021-07-05 ENCOUNTER — Telehealth: Payer: Self-pay | Admitting: Pharmacist

## 2021-07-05 ENCOUNTER — Encounter: Payer: Self-pay | Admitting: Internal Medicine

## 2021-07-05 NOTE — Telephone Encounter (Signed)
07/05/2021 12:17:44 PM - Cymbalta forms to pat. & dr  -- Elmer Picker - Thursday, July 05, 2021 12:16 PM --Received pharmacy printout for Cymbalta 30mg  Take 1 capsule by mouth daily. Printed [employer] application--mailing patient her portion to sign & return, also sending to [facility] for [place] to sign.

## 2021-07-05 NOTE — Progress Notes (Signed)
No appt needed

## 2021-07-11 ENCOUNTER — Telehealth: Payer: Self-pay | Admitting: Student in an Organized Health Care Education/Training Program

## 2021-07-16 ENCOUNTER — Telehealth: Payer: Self-pay | Admitting: Pharmacist

## 2021-07-16 ENCOUNTER — Encounter: Payer: Self-pay | Admitting: Pulmonary Disease

## 2021-07-16 MED ORDER — STIOLTO RESPIMAT 2.5-2.5 MCG/ACT IN AERS
2.0000 | INHALATION_SPRAY | Freq: Every day | RESPIRATORY_TRACT | 0 refills | Status: DC
Start: 2021-07-16 — End: 2021-07-31

## 2021-07-16 NOTE — Telephone Encounter (Signed)
07/16/2021 4:04:01 PM - Cymbalta faxed to [place] - Monday, July 16, 2021 4:03 PM --[employer] application for enrollment for Cymbalta 30mg .

## 2021-07-16 NOTE — Telephone Encounter (Signed)
07/16/2021 9:00:24 AM - Cymbalta pending  -- Amber Livingston - Monday, July 16, 2021 8:59 AM --I have received the signed provider portion of [employer] application for Cymbalta 30mg --holding for patient to return her portion--mailed to patient 07/05/21.

## 2021-07-16 NOTE — Telephone Encounter (Signed)
I called and spoke with the pt  She is out of Stiolto and waiting for some to come in the mail  I left her 3 samples up front to get her through until rx comes  Nothing further needed

## 2021-07-17 ENCOUNTER — Ambulatory Visit: Payer: Self-pay | Admitting: Licensed Clinical Social Worker

## 2021-07-17 ENCOUNTER — Other Ambulatory Visit: Payer: Self-pay | Admitting: Gerontology

## 2021-07-17 ENCOUNTER — Other Ambulatory Visit: Payer: Self-pay

## 2021-07-17 DIAGNOSIS — F451 Undifferentiated somatoform disorder: Secondary | ICD-10-CM

## 2021-07-17 DIAGNOSIS — F411 Generalized anxiety disorder: Secondary | ICD-10-CM

## 2021-07-17 DIAGNOSIS — F339 Major depressive disorder, recurrent, unspecified: Secondary | ICD-10-CM

## 2021-07-17 MED FILL — Albuterol Sulfate Inhal Aero 108 MCG/ACT (90MCG Base Equiv): RESPIRATORY_TRACT | 75 days supply | Qty: 25.5 | Fill #0 | Status: AC

## 2021-07-17 NOTE — BH Specialist Note (Signed)
Integrated Behavioral Health Follow Up Telephone  Visit  MRN: [id number] Name: Cleaster Shiffer   Total time: 60 minutes  Types of Service: Telephone visit Patient consents to telephone visit and 2 patient identifiers were used to identify patient  Interpretor:No. Interpretor Name and Language: N/A  Subjective: Margareta Rozzell is a 60 y.o. female accompanied by  herself Patient was referred by [name], NP for Mental Health. Patient reports the following symptoms/concerns: The patient reports that she has been experiencing increased anxiety regarding her health since her last follow-up appointment. 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 Theresea discussed other health stressors impacting her life currently. She shared that she has experienced several days that she has been unable to get out of bed due to a fibromyalgia flare. Umaima noted that she is continuing to experience frustration and increased anxiety with navigating the social security disability process and accessing other resources in her community. She stated that she feels like her progress has slowed. The patient discussed her Thanksgiving and her relationships with friends in her life. Loma denied nay suicidal or homicidal thoughts.  Duration of problem: Years; Severity of problem: moderate  Objective: Mood: Euthymic and Affect: Appropriate Risk of harm to self or others: No plan to harm self or others  Life Context: Family and Social: see above School/Work: see above Self-Care: see above Life Changes: see above  Patient and/or Family's Strengths/Protective Factors: Concrete supports in place (healthy food, safe environments, etc.) and Sense of purpose  Goals Addressed: Patient will:  Reduce symptoms of: agitation, anxiety, depression, mood instability, and stress   Increase knowledge and/or ability of: coping skills, healthy habits, self-management skills, and stress  reduction   Demonstrate ability to: Increase healthy adjustment to current life circumstances and Increase adequate support systems for patient/family  Progress towards Goals: Ongoing  Interventions: Interventions utilized:  CBT Cognitive Behavioral Therapy was utilized by the clinician during today's follow up session. Clinician met with patient to identify needs related to stressors and functioning, and assess and monitor for signs and symptoms of anxiety and depression, and assess safety. The clinician processed with the patient how they have been doing since the last follow-up session. Clinician measured the patient's anxiety and depression on a numerical scale. Clinician encouraged the patient to incorporate self care into her daily routine. Clinician encouraged the patient to continue to work on calming techniques (eg. paced breathing, deep muscle relaxation, and calming imagery) as a strategy for responding appropriately to anxiety and the urge to avoid situations or self-isolate when they occur and move towards increasing the patients self regulation.   Standardized Assessments completed: GAD-7 and PHQ 9 PHQ-9 = 13 GAD-7 = 12  Assessment: Patient currently experiencing see above.   Patient may benefit from see above.  Plan: Follow up with behavioral health clinician on : 07/26/2021 at 3:00 PM  Behavioral recommendations:  Referral(s): [place] (In Clinic) "From scale of 1-10, how likely are you to follow plan?":   Lesli Albee, LCSWA

## 2021-07-25 ENCOUNTER — Other Ambulatory Visit: Payer: Self-pay

## 2021-07-25 DIAGNOSIS — I700 Atherosclerosis of aorta: Secondary | ICD-10-CM

## 2021-07-25 DIAGNOSIS — E782 Mixed hyperlipidemia: Secondary | ICD-10-CM

## 2021-07-26 ENCOUNTER — Ambulatory Visit: Payer: Self-pay | Admitting: Licensed Clinical Social Worker

## 2021-07-26 DIAGNOSIS — F411 Generalized anxiety disorder: Secondary | ICD-10-CM

## 2021-07-26 DIAGNOSIS — F451 Undifferentiated somatoform disorder: Secondary | ICD-10-CM

## 2021-07-26 DIAGNOSIS — F339 Major depressive disorder, recurrent, unspecified: Secondary | ICD-10-CM

## 2021-07-26 LAB — ALT: ALT: 11 IU/L (ref 0–32)

## 2021-07-26 LAB — LIPID PANEL
Chol/HDL Ratio: 3.6 ratio (ref 0.0–4.4)
Cholesterol, Total: 260 mg/dL — ABNORMAL HIGH (ref 100–199)
HDL: 73 mg/dL (ref >39)
LDL Chol Calc (NIH): 173 mg/dL — ABNORMAL HIGH (ref 0–99)
Triglycerides: 83 mg/dL (ref 0–149)
VLDL Cholesterol Cal: 14 mg/dL (ref 5–40)

## 2021-07-26 NOTE — BH Specialist Note (Signed)
Integrated Behavioral Health Follow Up In-Person Visit  MRN: [id number] Name: Amber Livingston   Total time: 60 minutes  Types of Service: Telephone visit  Interpretor:No. Interpretor Name and Language: N/A  Subjective: Amber Livingston is a 60 y.o. female accompanied by  herself Patient was referred by [name], NP for mental health. Patient reports the following symptoms/concerns: The patient reports that she has had good and bad days sine her last follow-up appointment. She shared that she has been experiencing a flair up of fibromyalgia and did not feel up to a long session today. Amber Livingston stated that she has been concerned over some news she received from her cardiologist and is still processing the fact she may or may not have a mass on her heart. She noted that she is sleeping better and overall feels grateful for the progress she has made with her health this year.The patient discussed financial and interpersonal relationship stressors impacting her life. The patient stated that she is looking forward to the upcoming holidays. Amber Livingston denied any suicidal or homicidal thoughts.  Duration of problem: Years; Severity of problem: moderate  Objective: Mood: Euthymic and Affect: Appropriate Risk of harm to self or others: No plan to harm self or others  Life Context: Family and Social: see above School/Work: see above Self-Care: see above Life Changes: see above  Patient and/or Family's Strengths/Protective Factors: Concrete supports in place (healthy food, safe environments, etc.) and Sense of purpose  Goals Addressed: Patient will:  Reduce symptoms of: agitation, anxiety, depression, insomnia, and stress   Increase knowledge and/or ability of: coping skills, healthy habits, self-management skills, and stress reduction   Demonstrate ability to: Increase healthy adjustment to current life circumstances and Increase adequate support systems for patient/family  Progress towards  Goals: Ongoing  Interventions: Interventions utilized:  CBT Cognitive Behavioral Therapywas utilized by the clinician during today's follow up session. Clinician met with patient to identify needs related to stressors and functioning, and assess and monitor for signs and symptoms of anxiety and depression, and assess safety. The clinician processed with the patient how they have been doing since the last follow-up session. Clinician offered to message the patient's provider regarding their psychotropic medication refill request. The clinician encouraged the patient to utilize their coping skills to deal with their current life circumstances. Session ended with scheduling. Standardized Assessments completed: Patient declined screening  Assessment: Patient currently experiencing see above.   Patient may benefit from see above.  Plan: Follow up with behavioral health clinician on : 08/21/2021 at 12:00 noon Behavioral recommendations:  Referral(s): [place] (In Clinic) "From scale of 1-10, how likely are you to follow plan?":   Lesli Albee, LCSWA

## 2021-07-27 ENCOUNTER — Other Ambulatory Visit: Payer: Self-pay | Admitting: Gerontology

## 2021-07-27 DIAGNOSIS — F341 Dysthymic disorder: Secondary | ICD-10-CM

## 2021-07-27 MED ORDER — DULOXETINE HCL 30 MG PO CPEP
30.0000 mg | ORAL_CAPSULE | Freq: Every day | ORAL | 1 refills | Status: DC
  Filled 2021-07-27: qty 30, 30d supply, fill #0
  Filled 2021-09-03: qty 30, 30d supply, fill #1

## 2021-07-30 ENCOUNTER — Other Ambulatory Visit: Payer: Self-pay

## 2021-07-31 ENCOUNTER — Other Ambulatory Visit: Payer: Self-pay

## 2021-07-31 ENCOUNTER — Ambulatory Visit: Payer: Self-pay | Admitting: Gerontology

## 2021-07-31 ENCOUNTER — Encounter: Payer: Self-pay | Admitting: Gerontology

## 2021-07-31 VITALS — BP 108/71 | HR 77 | Temp 98.1°F | Resp 18 | Ht 68.0 in | Wt 123.3 lb

## 2021-07-31 DIAGNOSIS — E785 Hyperlipidemia, unspecified: Secondary | ICD-10-CM

## 2021-07-31 DIAGNOSIS — F341 Dysthymic disorder: Secondary | ICD-10-CM

## 2021-07-31 DIAGNOSIS — R0602 Shortness of breath: Secondary | ICD-10-CM

## 2021-07-31 MED ORDER — TRAZODONE HCL 50 MG PO TABS
ORAL_TABLET | ORAL | 1 refills | Status: DC
  Filled 2021-07-31: qty 15, 30d supply, fill #0
  Filled 2021-09-26: qty 15, 30d supply, fill #1

## 2021-07-31 NOTE — Patient Instructions (Signed)
Heart-Healthy Eating Plan Heart-healthy meal planning includes: Eating less unhealthy fats. Eating more healthy fats. Making other changes in your diet. Talk with your doctor or a diet specialist (dietitian) to create an eating plan that is right for you. What is my plan? Your doctor may recommend an eating plan that includes: Total fat: ______% or less of total calories a day. Saturated fat: ______% or less of total calories a day. Cholesterol: less than _________mg a day. What are tips for following this plan? Cooking Avoid frying your food. Try to bake, boil, grill, or broil it instead. You can also reduce fat by: Removing the skin from poultry. Removing all visible fats from meats. Steaming vegetables in water or broth. Meal planning  At meals, divide your plate into four equal parts: Fill one-half of your plate with vegetables and green salads. Fill one-fourth of your plate with whole grains. Fill one-fourth of your plate with lean protein foods. Eat 4-5 servings of vegetables per day. A serving of vegetables is: 1 cup of raw or cooked vegetables. 2 cups of raw leafy greens. Eat 4-5 servings of fruit per day. A serving of fruit is: 1 medium whole fruit.  cup of dried fruit.  cup of fresh, frozen, or canned fruit.  cup of 100% fruit juice. Eat more foods that have soluble fiber. These are apples, broccoli, carrots, beans, peas, and barley. Try to get 20-30 g of fiber per day. Eat 4-5 servings of nuts, legumes, and seeds per week: 1 serving of dried beans or legumes equals  cup after being cooked. 1 serving of nuts is  cup. 1 serving of seeds equals 1 tablespoon. General information Eat more home-cooked food. Eat less restaurant, buffet, and fast food. Limit or avoid alcohol. Limit foods that are high in starch and sugar. Avoid fried foods. Lose weight if you are overweight. Keep track of how much salt (sodium) you eat. This is important if you have high blood  pressure. Ask your doctor to tell you more about this. Try to add vegetarian meals each week. Fats Choose healthy fats. These include olive oil and canola oil, flaxseeds, walnuts, almonds, and seeds. Eat more omega-3 fats. These include salmon, mackerel, sardines, tuna, flaxseed oil, and ground flaxseeds. Try to eat fish at least 2 times each week. Check food labels. Avoid foods with trans fats or high amounts of saturated fat. Limit saturated fats. These are often found in animal products, such as meats, butter, and cream. These are also found in plant foods, such as palm oil, palm kernel oil, and coconut oil. Avoid foods with partially hydrogenated oils in them. These have trans fats. Examples are stick margarine, some tub margarines, cookies, crackers, and other baked goods. What foods can I eat? Fruits All fresh, canned (in natural juice), or frozen fruits. Vegetables Fresh or frozen vegetables (raw, steamed, roasted, or grilled). Green salads. Grains Most grains. Choose whole wheat and whole grains most of the time. Rice and pasta, including brown rice and pastas made with whole wheat. Meats and other proteins Lean, well-trimmed beef, veal, pork, and lamb. Chicken and turkey without skin. All fish and shellfish. Wild duck, rabbit, pheasant, and venison. Egg whites or low-cholesterol egg substitutes. Dried beans, peas, lentils, and tofu. Seeds and most nuts. Dairy Low-fat or nonfat cheeses, including ricotta and mozzarella. Skim or 1% milk that is liquid, powdered, or evaporated. Buttermilk that is made with low-fat milk. Nonfat or low-fat yogurt. Fats and oils Non-hydrogenated (trans-free) margarines. Vegetable oils, including   soybean, sesame, sunflower, olive, peanut, safflower, corn, canola, and cottonseed. Salad dressings or mayonnaise made with a vegetable oil. Beverages Mineral water. Coffee and tea. Diet carbonated beverages. Sweets and desserts Sherbet, gelatin, and fruit ice.  Small amounts of dark chocolate. Limit all sweets and desserts. Seasonings and condiments All seasonings and condiments. The items listed above may not be a complete list of foods and drinks you can eat. Contact a dietitian for more options. What foods should I avoid? Fruits Canned fruit in heavy syrup. Fruit in cream or butter sauce. Fried fruit. Limit coconut. Vegetables Vegetables cooked in cheese, cream, or butter sauce. Fried vegetables. Grains Breads that are made with saturated or trans fats, oils, or whole milk. Croissants. Sweet rolls. Donuts. High-fat crackers, such as cheese crackers. Meats and other proteins Fatty meats, such as hot dogs, ribs, sausage, bacon, rib-eye roast or steak. High-fat deli meats, such as salami and bologna. Caviar. Domestic duck and goose. Organ meats, such as liver. Dairy Cream, sour cream, cream cheese, and creamed cottage cheese. Whole-milk cheeses. Whole or 2% milk that is liquid, evaporated, or condensed. Whole buttermilk. Cream sauce or high-fat cheese sauce. Yogurt that is made from whole milk. Fats and oils Meat fat, or shortening. Cocoa butter, hydrogenated oils, palm oil, coconut oil, palm kernel oil. Solid fats and shortenings, including bacon fat, salt pork, lard, and butter. Nondairy cream substitutes. Salad dressings with cheese or sour cream. Beverages Regular sodas and juice drinks with added sugar. Sweets and desserts Frosting. Pudding. Cookies. Cakes. Pies. Milk chocolate or white chocolate. Buttered syrups. Full-fat ice cream or ice cream drinks. The items listed above may not be a complete list of foods and drinks to avoid. Contact a dietitian for more information. Summary Heart-healthy meal planning includes eating less unhealthy fats, eating more healthy fats, and making other changes in your diet. Eat a balanced diet. This includes fruits and vegetables, low-fat or nonfat dairy, lean protein, nuts and legumes, whole grains, and  heart-healthy oils and fats. This information is not intended to replace advice given to you by your health care provider. Make sure you discuss any questions you have with your health care provider. Document Revised: 12/14/2020 Document Reviewed: 12/14/2020 Elsevier Patient Education  2022 [employer]  

## 2021-07-31 NOTE — Progress Notes (Signed)
Established Patient Office Visit  Subjective:  Patient ID: Amber Livingston, female    DOB: Oct 23, 1960  Age: 60 y.o. MRN: [id number]  CC:  Chief Complaint  Patient presents with   Follow-up    Labs drawn 07/25/21    HPI Amber Livingston is a 60 y/o female who has a history of Arthritis, Cervical Spinal Stenosis, Fibromyalgia, Uterine Fibroid and Depression,presents for follow up visit and lab review. She had Low dose chest CT scan done on 05/11/21, Lung-RADS 2, benign appearance or behavior. Continue annual screening with low-dose chest CT without contrast in 12 months. 2. Aortic atherosclerosis. 3. Mild diffuse bronchial wall thickening with mild centrilobular and paraseptal emphysema; imaging findings suggestive of underlying COPD. Aortic Atherosclerosis (ICD10-I70.0) and Emphysema (ICD10-J43.9). She states that she continues to experience intermittent shortness of breath and using her inhaler as ordered. She was seen at the Cardiology clinic on 06/01/21 by Dr Ladoris Gene. and will have cardiac MRI which was done on 07/04/21, she will follow up on 09/05/21 and  was started on 10 mg Rosuvastatin. Her LDL increased from 144 mg/dl to 173 mg/dl, total cholesterol from 224 mg/dl to 260 mg/dl. She states that she  self discontinued taking Rosuvastatin 10 mg because she wasn't tolerating medication. Overall, she states that she is doing well and offers no further complaint.     Past Medical History:  Diagnosis Date   Arthritis    Cervical spinal stenosis    Emphysema, unspecified (HCC)    Fibromyalgia    Stage 7   Lumbar stenosis    Uterine fibroid     Past Surgical History:  Procedure Laterality Date   ablation     APPENDECTOMY     DILATION AND CURETTAGE OF UTERUS     x3 for miscarriage   LAPAROSCOPY     Fibroid removal   PALPITATION     TONSILLECTOMY      Family History  Problem Relation Age of Onset   Uterine cancer Mother 53   Lung cancer Mother    Brain cancer Mother     Hypertension Mother    Alcohol abuse Maternal Grandmother    Breast cancer Paternal Grandmother 36   Dementia Father    Prostate cancer Father    Dementia Paternal Uncle    Dementia Paternal Grandfather     Social History   Socioeconomic History   Marital status: Single    Spouse name: Not on file   Number of children: Not on file   Years of education: Not on file   Highest education level: Not on file  Occupational History   Not on file  Tobacco Use   Smoking status: Former    Packs/day: 1.00    Years: 46.00    Pack years: 46.00    Types: Cigarettes    Quit date: 10/04/2020    Years since quitting: 0.8   Smokeless tobacco: Never   Tobacco comments:    verified 01/17/2021  Vaping Use   Vaping Use: Never used  Substance and Sexual Activity   Alcohol use: Not Currently   Drug use: Not Currently    Frequency: 7.0 times per week    Types: Marijuana    Comment: once daily, last use 10/2020   Sexual activity: Not Currently    Partners: Male  Other Topics Concern   Not on file  Social History Narrative   Not on file   Social Determinants of Health   Financial Resource Strain: Not on file  Food Insecurity: No Food Insecurity   Worried About Charity fundraiser in the Last Year: Never true   Ran Out of Food in the Last Year: Never true  Transportation Needs: No Transportation Needs   Lack of Transportation (Medical): No   Lack of Transportation (Non-Medical): No  Physical Activity: Not on file  Stress: Not on file  Social Connections: Not on file  Intimate Partner Violence: Not on file    Outpatient Medications Prior to Visit  Medication Sig Dispense Refill   BEE POLLEN PO Take 1 capsule by mouth daily.     Cyanocobalamin (B-12) 2500 MCG TABS Take by mouth daily.     D-Ribose (RIBOSE, D,) POWD 2,500 mg by Does not apply route in the morning and at bedtime.     diphenhydramine-acetaminophen (TYLENOL PM) 25-500 MG TABS tablet Take 2 tablets by mouth at bedtime as  needed.     DULoxetine (CYMBALTA) 30 MG capsule Take 1 capsule (30 mg total) by mouth once daily. 30 capsule 1   estradiol (ESTRACE) 0.5 MG tablet Take 1 tablet (0.5 mg total) by mouth once daily. 90 tablet 3   ibuprofen (ADVIL) 200 MG tablet Take 600 mg by mouth every 6 (six) hours as needed for moderate pain.     MAGNESIUM BISGLYCINATE PO Take 1,000 mg by mouth daily.     medroxyPROGESTERone (PROVERA) 2.5 MG tablet Take 1 tablet (2.5 mg total) by mouth once daily. 90 tablet 3   mometasone (ELOCON) 0.1 % ointment Apply topically as directed. Qhs to aa fingers hands up to 5 days a week until clear, then prn flares 45 g 1   nicotine polacrilex (COMMIT) 4 MG lozenge Take 4 mg by mouth as needed for smoking cessation.     NON FORMULARY as needed. CBD gummies     PROAIR HFA 108 (90 Base) MCG/ACT inhaler INHALE 2 PUFFS BY MOUTH EVERY 6 HOURS AS NEEDED FOR WHEEZING OR SHORT OF BREATH 34 g 11   Tiotropium Bromide-Olodaterol (STIOLTO RESPIMAT) 2.5-2.5 MCG/ACT AERS Inhale 1 puff into the lungs in the morning and at bedtime. 4 g 11   Turmeric (QC TUMERIC COMPLEX PO) Take 1,000 mg by mouth daily.     traZODone (DESYREL) 50 MG tablet TAKE 1/2 TABLET (25MG  TOTAL) BY MOUTH ONCE DAILY AT BEDTIME. 30 tablet 0   rosuvastatin (CRESTOR) 10 MG tablet Take 1 tablet (10 mg total) by mouth once daily. (Patient not taking: Reported on 07/31/2021) 90 tablet 3   Tiotropium Bromide-Olodaterol (STIOLTO RESPIMAT) 2.5-2.5 MCG/ACT AERS Inhale 2 puffs into the lungs daily. 4 g 0   Tiotropium Bromide-Olodaterol (STIOLTO RESPIMAT) 2.5-2.5 MCG/ACT AERS Inhale 2 puffs into the lungs daily. 4 g 0   No facility-administered medications prior to visit.    No Known Allergies  ROS Review of Systems  Constitutional: Negative.   Respiratory:  Positive for shortness of breath.   Cardiovascular: Negative.   Skin: Negative.   Neurological: Negative.   Psychiatric/Behavioral: Negative.       Objective:    Physical Exam HENT:      Head: Normocephalic and atraumatic.     Mouth/Throat:     Mouth: Mucous membranes are moist.  Eyes:     Extraocular Movements: Extraocular movements intact.     Conjunctiva/sclera: Conjunctivae normal.     Pupils: Pupils are equal, round, and reactive to light.  Cardiovascular:     Rate and Rhythm: Normal rate and regular rhythm.     Pulses: Normal pulses.  Heart sounds: Normal heart sounds.  Pulmonary:     Effort: Pulmonary effort is normal.     Breath sounds: Normal breath sounds.  Skin:    General: Skin is warm.  Neurological:     General: No focal deficit present.     Mental Status: She is alert and oriented to person, place, and time. Mental status is at baseline.  Psychiatric:        Mood and Affect: Mood normal.        Behavior: Behavior normal.        Thought Content: Thought content normal.        Judgment: Judgment normal.    BP 108/71 (BP Location: Right Arm, Patient Position: Sitting, Cuff Size: Normal)    Pulse 77    Temp 98.1 F (36.7 C) (Oral)    Resp 18    Ht 5\' 8"  (1.727 m)    Wt 123 lb 4.8 oz (55.9 kg)    SpO2 96%    BMI 18.75 kg/m  Wt Readings from Last 3 Encounters:  07/31/21 123 lb 4.8 oz (55.9 kg)  07/25/21 121 lb 6.4 oz (55.1 kg)  06/01/21 126 lb 3.2 oz (57.2 kg)     Health Maintenance Due  Topic Date Due   Pneumococcal Vaccine 41-2 Years old (1 - PCV) Never done   HIV Screening  Never done   Hepatitis C Screening  Never done   TETANUS/TDAP  Never done   Zoster Vaccines- Shingrix (1 of 2) Never done   COVID-19 Vaccine (3 - Moderna risk series) 01/13/2020    There are no preventive care reminders to display for this patient.  Lab Results  Component Value Date   TSH 0.863 03/02/2020   Lab Results  Component Value Date   WBC 4.2 09/27/2020   HGB 12.7 09/27/2020   HCT 37.5 09/27/2020   MCV 94 09/27/2020   PLT 321 09/27/2020   Lab Results  Component Value Date   NA 141 09/27/2020   K 5.0 09/27/2020   CO2 21 09/27/2020   GLUCOSE  88 09/27/2020   BUN 11 09/27/2020   CREATININE 0.75 09/27/2020   BILITOT 0.5 03/02/2020   ALKPHOS 72 03/02/2020   AST 14 03/02/2020   ALT 11 07/25/2021   PROT 7.0 03/02/2020   ALBUMIN 4.7 03/02/2020   CALCIUM 9.7 09/27/2020   Lab Results  Component Value Date   CHOL 260 (H) 07/25/2021   Lab Results  Component Value Date   HDL 73 07/25/2021   Lab Results  Component Value Date   LDLCALC 173 (H) 07/25/2021   Lab Results  Component Value Date   TRIG 83 07/25/2021   Lab Results  Component Value Date   CHOLHDL 3.6 07/25/2021   No results found for: HGBA1C    Assessment & Plan:    1. Persistent depressive disorder with anxious distress, currently severe - She will continue on current medication. - traZODone (DESYREL) 50 MG tablet; TAKE 1/2 TABLET (25MG  TOTAL) BY MOUTH ONCE DAILY AT BEDTIME.  Dispense: 30 tablet; Refill: 1  2. Elevated lipids - Will check CK and if normal will advised to resume Rosuvastatin maybe at a lower dose. She was advised to continue on low fat/cholesterol diet. - CK (Creatine Kinase)   3. Shortness of breath - She was advised to continue using her inhaler and to go to the ED for worsening symptoms.    Follow-up: Return in about 13 weeks (around 10/30/2021), or if symptoms worsen or fail  to improve.    Ashwini Jago Jerold Coombe, NP

## 2021-08-01 ENCOUNTER — Other Ambulatory Visit: Payer: Self-pay

## 2021-08-01 ENCOUNTER — Other Ambulatory Visit: Payer: Self-pay | Admitting: Gerontology

## 2021-08-01 DIAGNOSIS — E785 Hyperlipidemia, unspecified: Secondary | ICD-10-CM

## 2021-08-01 LAB — CK: Total CK: 144 U/L (ref 32–182)

## 2021-08-01 MED ORDER — ROSUVASTATIN CALCIUM 5 MG PO TABS
5.0000 mg | ORAL_TABLET | Freq: Every day | ORAL | 0 refills | Status: DC
  Filled 2021-08-01: qty 30, 30d supply, fill #0

## 2021-08-02 ENCOUNTER — Other Ambulatory Visit: Payer: Self-pay

## 2021-08-02 ENCOUNTER — Telehealth: Payer: Self-pay

## 2021-08-02 ENCOUNTER — Other Ambulatory Visit: Payer: Self-pay | Admitting: Gerontology

## 2021-08-02 MED ORDER — EZETIMIBE 10 MG PO TABS
10.0000 mg | ORAL_TABLET | Freq: Every day | ORAL | 3 refills | Status: DC
Start: 2021-08-02 — End: 2021-11-01
  Filled 2021-08-02: qty 30, 30d supply, fill #0
  Filled 2021-08-31: qty 30, 30d supply, fill #1
  Filled 2021-09-27: qty 30, 30d supply, fill #2

## 2021-08-02 NOTE — Telephone Encounter (Signed)
-----   Message from Werner Lean, MD sent at 08/02/2021  8:06 AM EST ----- Can we trial this patient on zetia 10 mg PO daily instead of rosuvastatin?  If this doesn't work we can try lipid clinic.  Thanks, MAC ----- Message ----- From: Precious Gilding, RN Sent: 08/01/2021  10:51 AM EST To: Werner Lean, MD  Pt reports only taking rosuvastatin for 4 days.  Pt reports med made her feel horrible, once stopped taking med felt better.  Pt would like to know if she should retry med or be prescribed a new medication.

## 2021-08-02 NOTE — Telephone Encounter (Signed)
Spoke with the patient and Rx has been sent in for zetia. She will let us know next week if she cannot tolerate it.

## 2021-08-06 ENCOUNTER — Other Ambulatory Visit: Payer: Self-pay

## 2021-08-16 ENCOUNTER — Other Ambulatory Visit: Payer: Self-pay

## 2021-08-21 ENCOUNTER — Ambulatory Visit: Payer: Self-pay | Admitting: Licensed Clinical Social Worker

## 2021-08-21 ENCOUNTER — Other Ambulatory Visit: Payer: Self-pay

## 2021-08-21 DIAGNOSIS — F411 Generalized anxiety disorder: Secondary | ICD-10-CM

## 2021-08-21 DIAGNOSIS — F341 Dysthymic disorder: Secondary | ICD-10-CM

## 2021-08-21 DIAGNOSIS — F451 Undifferentiated somatoform disorder: Secondary | ICD-10-CM

## 2021-08-21 NOTE — BH Specialist Note (Signed)
Integrated Behavioral Health via Telemedicine Visit  08/21/2021 Bena Kobel [id number]  Total time: 105  Referring Provider: [name], NP  Patient/Family location: The patient's home address.  [facility] Provider location: [facility] [place] All persons participating in visit: Brigitta Gowan and Jerrilyn Cairo, LCSW-A Types of Service: Telephone visit  I connected with Saidee Inda via  Telephone or Video Enabled Telemedicine Application  (Video is Caregility application) and verified that I am speaking with the correct person using two identifiers. Discussed confidentiality: Yes   I discussed the limitations of telemedicine and the availability of in person appointments.  Discussed there is a possibility of technology failure and discussed alternative modes of communication if that failure occurs.  Patient and/or legal guardian expressed understanding and consented to Telemedicine visit: Yes   Presenting Concerns: Patient and/or family reports the following symptoms/concerns: The patient reported that she has been doing well since her last follow up appointment. Larine noted that she was able to complete the required examination for her social security disability determination, but found the process painful and exhausting. She shared that she has been resting over the holidays and enjoying taking a break from the multiple doctor visits she has daily. The patient shared that she continues to struggle with a lack of appetite. She noted she is sleeping better, but continues to have severe fatigue throughout her day. Iszabella shared that she has difficulty concentrating and finds it difficult to finish tasks or television without becoming distracted by her  thoughts. She noted that she has been reflecting on the friends she lost this past year. The patient denied any suicidal or homicidal thoughts.  Duration of problem: Years; Severity of problem: moderate  Patient and/or Family's  Strengths/Protective Factors: Concrete supports in place (healthy food, safe environments, etc.)  Goals Addressed: Patient will:  Reduce symptoms of: agitation, anxiety, depression, insomnia, and stress   Increase knowledge and/or ability of: coping skills, healthy habits, self-management skills, and stress reduction   Demonstrate ability to: Increase healthy adjustment to current life circumstances and Increase adequate support systems for patient/family  Progress towards Goals: Ongoing  Interventions: Interventions utilized:  CBT Cognitive Behavioral Therapy was utilized by the clinician during today's follow up session. Clinician met with patient to identify needs related to stressors and functioning, and assess and monitor for signs and symptoms of anxiety and depression, and assess safety. The clinician processed with the patient how they have been doing since the last follow-up session. Clinician measured the patient's depression and anxiety on a numerical scale. Clinician encouraged the patient to focus on the positives in her life verses the negatives and to start a gratitude journal. Clinician ended the session with scheduling.  Standardized Assessments completed: GAD-7 and PHQ 9 GAD-7 =  11 PHQ-9 =  15    Assessment: Patient currently experiencing see above.   Patient may benefit from see above.  Plan: Follow up with behavioral health clinician on : 09/06/2021 at 12:00 noon Behavioral recommendations:  Referral(s): [facility] (In Clinic)  I discussed the assessment and treatment plan with the patient and/or parent/guardian. They were provided an opportunity to ask questions and all were answered. They agreed with the plan and demonstrated an understanding of the instructions.   They were advised to call back or seek an in-person evaluation if the symptoms worsen or if the condition fails to improve as anticipated.  Lesli Albee, LCSWA

## 2021-08-21 NOTE — BH Specialist Note (Deleted)
Integrated Behavioral Health Follow Up In-Person Visit  MRN: [id number] Name: Amber Livingston  Number of [place] Clinician visits: {IBH Number of Visits:21014052} Session Start time: ***  Session End time: *** Total time: {IBH Total Time:21014050} minutes  Types of Service: Telephone visit  Interpretor:No. Interpretor Name and Language: N/A  Subjective: Amber Livingston is a 61 y.o. female accompanied by *** Patient was referred by *** for ***. Patient reports the following symptoms/concerns: *** Duration of problem: ***; Severity of problem: {Mild/Moderate/Severe:20260}  Objective: Mood: {BHH MOOD:22306} and Affect: {BHH AFFECT:22307} Risk of harm to self or others: {CHL AMB BH Suicide Current Mental Status:21022748}  Life Context: Family and Social: *** School/Work: *** Self-Care: *** Life Changes: ***  Patient and/or Family's Strengths/Protective Factors: {CHL AMB BH PROTECTIVE FACTORS:[phone]}  Goals Addressed: Patient will:  Reduce symptoms of: {IBH Symptoms:21014056}   Increase knowledge and/or ability of: {IBH Patient Tools:21014057}   Demonstrate ability to: {IBH Goals:21014053}  Progress towards Goals: {CHL AMB BH PROGRESS TOWARDS [phone]}  Interventions: Interventions utilized:  {IBH Interventions:21014054} Standardized Assessments completed: {IBH Screening Tools:21014051}  Patient and/or Family Response: ***  Patient Centered Plan: Patient is on the following Treatment Plan(s): *** Assessment: Patient currently experiencing ***.   Patient may benefit from ***.  Plan: Follow up with behavioral health clinician on : *** Behavioral recommendations: *** Referral(s): {IBH Referrals:21014055} "From scale of 1-10, how likely are you to follow plan?": ***  Lesli Albee, LCSWA

## 2021-08-31 ENCOUNTER — Other Ambulatory Visit: Payer: Self-pay

## 2021-09-03 ENCOUNTER — Other Ambulatory Visit: Payer: Self-pay

## 2021-09-05 ENCOUNTER — Other Ambulatory Visit: Payer: Self-pay

## 2021-09-05 ENCOUNTER — Ambulatory Visit: Payer: Self-pay | Admitting: Pharmacist

## 2021-09-05 ENCOUNTER — Encounter: Payer: Self-pay | Admitting: Pharmacist

## 2021-09-05 ENCOUNTER — Other Ambulatory Visit: Payer: Self-pay | Admitting: *Deleted

## 2021-09-05 DIAGNOSIS — I700 Atherosclerosis of aorta: Secondary | ICD-10-CM

## 2021-09-05 DIAGNOSIS — E782 Mixed hyperlipidemia: Secondary | ICD-10-CM

## 2021-09-05 DIAGNOSIS — Z79899 Other long term (current) drug therapy: Secondary | ICD-10-CM

## 2021-09-05 LAB — LIPID PANEL
Chol/HDL Ratio: 3.2 ratio (ref 0.0–4.4)
Cholesterol, Total: 239 mg/dL — ABNORMAL HIGH (ref 100–199)
HDL: 75 mg/dL (ref >39)
LDL Chol Calc (NIH): 143 mg/dL — ABNORMAL HIGH (ref 0–99)
Triglycerides: 118 mg/dL (ref 0–149)
VLDL Cholesterol Cal: 21 mg/dL (ref 5–40)

## 2021-09-05 LAB — ALT: ALT: 19 IU/L (ref 0–32)

## 2021-09-05 NOTE — Progress Notes (Signed)
Medication Management Clinic Visit Note  Patient: Amber Livingston MRN: [id number] Date of Birth: 01-13-1961 PCP: Amber Reusing, NP   Amber Livingston 61 y.o. female presents for an MTM visit via telephone today. Correctly verified 2 forms of identification being full name and date of birth.  There were no vitals taken for this visit.  Patient Information   Past Medical History:  Diagnosis Date   Anxiety    Arterial atherosclerosis    Arthritis    Atherosclerosis    Cervical spinal stenosis    Depression    Emphysema, unspecified (HCC)    Fibromyalgia    Stage 7   Hyperlipidemia    Lumbar stenosis    Uterine fibroid       Past Surgical History:  Procedure Laterality Date   ablation     APPENDECTOMY     DILATION AND CURETTAGE OF UTERUS     x3 for miscarriage   LAPAROSCOPY     Fibroid removal   PALPITATION     TONSILLECTOMY       Family History  Problem Relation Age of Onset   Cancer Mother    Uterine cancer Mother 47   Lung cancer Mother    Brain cancer Mother    Hypertension Mother    Cancer Father    Dementia Father    Prostate cancer Father    Dementia Paternal Uncle    Alcohol abuse Maternal Grandmother    Breast cancer Paternal Grandmother 73   Dementia Paternal Grandfather     New Diagnoses (since last visit):   Family Support: Good  Lifestyle Diet: Breakfast: Fruit smoothies. Lunch: Fish, chicken, and vegetables. Dinner: Fish, chicken, and vegetables. Drinks: Lots of Coffee and water with lemon.    Current Exercise Habits: The patient does not participate in regular exercise at present (Patient used to walk 1.2 miles per day but hasn't lately.)       Social History   Substance and Sexual Activity  Alcohol Use Not Currently      Social History   Tobacco Use  Smoking Status Former   Packs/day: 1.00   Years: 46.00   Pack years: 46.00   Types: Cigarettes   Quit date: 10/04/2020   Years since quitting: 0.9  Smokeless Tobacco Never   Tobacco Comments   verified 01/17/2021      Health Maintenance  Topic Date Due   Pneumococcal Vaccine 18-109 Years old (1 - PCV) Never done   HIV Screening  Never done   Hepatitis C Screening  Never done   TETANUS/TDAP  Never done   Zoster Vaccines- Shingrix (1 of 2) Never done   COVID-19 Vaccine (3 - Moderna risk series) 01/13/2020   MAMMOGRAM  02/23/2023   PAP SMEAR-Modifier  03/05/2024   COLONOSCOPY (Pts 45-23yrs Insurance coverage will need to be confirmed)  08/27/2029   INFLUENZA VACCINE  Completed   HPV VACCINES  Aged Out   Health Maintenance/Date Completed  Last ED visit: 08/30/2019 Last Visit to PCP: 07/31/2021 Next Visit to PCP: N/A Specialist Visit: 08/21/2021 Dental Exam: Years ago. Eye Exam: Back in 20's. Prostate Exam: N/A Pelvic/PAP Exam: Summer of 2022 Mammogram: Summer of 2022 DEXA: No Colonoscopy: 09/2019 Flu Vaccine: Yes Pneumonia Vaccine: No COVID-19 Vaccine: Yes reports received 4 doses. Shingrix Vaccine: No  Outpatient Encounter Medications as of 09/05/2021  Medication Sig   BEE POLLEN PO Take 1 capsule by mouth daily.   Cyanocobalamin (B-12) 2500 MCG TABS Take by mouth daily.   D-Ribose (RIBOSE,  D,) POWD 2,500 mg by Does not apply route 3 (three) times daily.   diphenhydramine-acetaminophen (TYLENOL PM) 25-500 MG TABS tablet Take 2 tablets by mouth at bedtime as needed.   DULoxetine (CYMBALTA) 30 MG capsule Take 1 capsule (30 mg total) by mouth once daily.   estradiol (ESTRACE) 0.5 MG tablet Take 1 tablet (0.5 mg total) by mouth once daily.   ezetimibe (ZETIA) 10 MG tablet Take 1 tablet (10 mg total) by mouth once daily.   ibuprofen (ADVIL) 200 MG tablet Take 600 mg by mouth every 6 (six) hours as needed for moderate pain.   MAGNESIUM BISGLYCINATE PO Take 1,000 mg by mouth daily.   medroxyPROGESTERone (PROVERA) 2.5 MG tablet Take 1 tablet (2.5 mg total) by mouth once daily.   mometasone (ELOCON) 0.1 % ointment Apply topically as directed. Qhs to aa  fingers hands up to 5 days a week until clear, then prn flares   nicotine polacrilex (COMMIT) 4 MG lozenge Take 4 mg by mouth as needed for smoking cessation.   NON FORMULARY as needed. CBD gummies   PROAIR HFA 108 (90 Base) MCG/ACT inhaler INHALE 2 PUFFS BY MOUTH EVERY 6 HOURS AS NEEDED FOR WHEEZING OR SHORT OF BREATH   Tiotropium Bromide-Olodaterol (STIOLTO RESPIMAT) 2.5-2.5 MCG/ACT AERS Inhale 1 puff into the lungs in the morning and at bedtime.   Turmeric (QC TUMERIC COMPLEX PO) Take 1,000 mg by mouth daily.   traZODone (DESYREL) 50 MG tablet TAKE 1/2 TABLET (25MG  TOTAL) BY MOUTH ONCE DAILY AT BEDTIME.   No facility-administered encounter medications on file as of 09/05/2021.     Assessment and Plan:  Hyperlipidemia: Patient reports being compliant to Zetia regimen but does not take Crestor due to side effects. Lipid panel back in 07/25/21 reported an LDL/TC of 173/260 respectively, which was an increase from 06/01/21 with an LDL/TC 144/224. Patient reported getting blood work today from cardiologist and will follow up with lipid panel results. Depression/Anxiety: Patient reports being compliant to Cymbalta and Trazodone regimen and states that sometimes patient feels some "lag" but is stable. COPD/Emphysema: Patient reports being compliant to Proair and Stiolto regimen and reports that [employer] works well since the switch from [place]. Patient remains stable at this time. Fibromyalgia: Patient reports taking ibuprofen, tumeric, and some CBD/marijuana gummies for the pain. Patient reports only taking these gummies when needed for pain flares of which the last dose was yesterday 09/04/21, otherwise reports being stable at this time. Hormone Replacement: Patient reports being compliant with Provera and Estrace regimen and is stable at this time.  Congratulated Patient on a great diet and encouraged Patient to continue a healthy diet while increasing exercise when able. Counseled Patient on the  importance of medication compliance and encouraged Patient to follow up with cardiologist and PCP regarding lipid panel and immunizations such as pneumonia and shingles. Also counseled Patient to inquire about scheduling a dental and an eye doctor appointment utilizing the PCP as a referring resource.   Return to clinic: 1 year.  Sinclair Ship, PharmD Medication Management Clinic Phone: [phone] 09/05/21

## 2021-09-05 NOTE — Progress Notes (Signed)
Pt here for lab work not showing up in the system.  Orders replaced for FLP and ALT.

## 2021-09-06 ENCOUNTER — Ambulatory Visit: Payer: Self-pay | Admitting: Licensed Clinical Social Worker

## 2021-09-06 ENCOUNTER — Telehealth: Payer: Self-pay

## 2021-09-06 DIAGNOSIS — F341 Dysthymic disorder: Secondary | ICD-10-CM

## 2021-09-06 DIAGNOSIS — E782 Mixed hyperlipidemia: Secondary | ICD-10-CM

## 2021-09-06 DIAGNOSIS — F411 Generalized anxiety disorder: Secondary | ICD-10-CM

## 2021-09-06 DIAGNOSIS — F451 Undifferentiated somatoform disorder: Secondary | ICD-10-CM

## 2021-09-06 NOTE — BH Specialist Note (Signed)
Integrated Behavioral Health via Telemedicine Visit  09/06/2021 Amber Livingston [id number]   Total time: 34  Referring Provider: [name], NP  Patient/Family location: The patient  [facility] Provider location: [facility]  All persons participating in visit: Amber Livingston and Amber Livingston, MSW, LCSW-A Types of Service: Telephone visit  I connected with Amber Livingston via Telephone or Video Enabled Telemedicine Application  (Video is Caregility application) and verified that I am speaking with the correct person using two identifiers. Discussed confidentiality: Yes   I discussed the limitations of telemedicine and the availability of in person appointments.  Discussed there is a possibility of technology failure and discussed alternative modes of communication if that failure occurs.  Patient and/or legal guardian expressed understanding and consented to Telemedicine visit: Yes   Presenting Concerns: Patient and/or family reports the following symptoms/concerns: The patient reports that she has been doing the same since her last follow-up appointment. She noted that she heard back from her disability lawyer and the two reports coming in from the state examinations she received stated that she was not able to work. Amber Livingston stated that she is hopeful she will receive her SSI claim soon so she can receive some time of financial security and increased access to medical care. Amber Livingston shared that she is experiencing a current fibromyalgia flare and has been in bed for several days. She shared that she takes several supplements and soaks in an Epson salt bath in the mornings. Amber Livingston stated that she was encouraged by a doctor who works for the state to request her provider prescribe Suboxone as a treatment for Fibromyalgia. Amber Livingston shared that she has made an appointment with her provider for February. Amber Livingston shared that overall she feels she is managing the stress of chronic pain better. The patient denied any  suicidal or homicidal thoughts.  Duration of problem: Years; Severity of problem: moderate  Patient and/or Family's Strengths/Protective Factors: Concrete supports in place (healthy food, safe environments, etc.) and Sense of purpose  Goals Addressed: Patient will:  Reduce symptoms of: agitation, anxiety, depression, insomnia, and stress   Increase knowledge and/or ability of: coping skills, healthy habits, self-management skills, and stress reduction   Demonstrate ability to: Increase healthy adjustment to current life circumstances and Increase adequate support systems for patient/family  Progress towards Goals: Ongoing  Interventions: Interventions utilized:  CBT Cognitive Behavioral Therapywas utilized by the clinician during today's follow up session. Clinician met with patient to identify needs related to stressors and functioning, and assess and monitor for signs and symptoms of anxiety and depression, and assess safety. The clinician processed with the patient how they have been doing since the last follow-up session. Clinician measured the patient's anxiety and depression on a numerical scale. Clinician assessed the patient's implementation of previously taught distress tolerance techniques. Clinician encouraged the patient to focus on the positives verses the negatives in her life and to be mindful to incorporate self care into her daily routine. The session ended with scheduling. Standardized Assessments completed: GAD-7 and PHQ 9 GAD-7 = 11 PHQ-9 = 12  Patient and/or Family Response: The patient noted that she will send the clinician a list of the supplements and the dosages she takes for each.   Assessment: Patient currently experiencing see above.   Patient may benefit from see above.  Plan: Follow up with behavioral health clinician on : 09/18/2020 at 12:00 noon Behavioral recommendations:  Referral(s): [place] (In Clinic)  I discussed the  assessment and treatment  plan with the patient and/or parent/guardian. They were provided an opportunity to ask questions and all were answered. They agreed with the plan and demonstrated an understanding of the instructions.   They were advised to call back or seek an in-person evaluation if the symptoms worsen or if the condition fails to improve as anticipated.  Lesli Albee, LCSWA

## 2021-09-06 NOTE — Telephone Encounter (Signed)
-----   Message from Werner Lean, MD sent at 09/06/2021  4:29 PM EST ----- Let's send her to lipid clinic; she did not tolerate statins well ----- Message ----- From: Antonieta Iba, RN Sent: 09/06/2021   4:04 PM EST To: Werner Lean, MD  The patient has been notified of the result and verbalized understanding.  All questions (if any) were answered. Antonieta Iba, RN 09/06/2021 4:04 PM   Patient is already taking zetia 10 mg daily

## 2021-09-06 NOTE — Telephone Encounter (Signed)
Referral has been placed. Could not hear patient on the phone  - she does not have good service.  MyChart message has been sent to the patient to make her aware of referral.

## 2021-09-13 ENCOUNTER — Other Ambulatory Visit: Payer: Self-pay

## 2021-09-13 ENCOUNTER — Other Ambulatory Visit: Payer: Self-pay | Admitting: Gerontology

## 2021-09-13 DIAGNOSIS — F341 Dysthymic disorder: Secondary | ICD-10-CM

## 2021-09-17 ENCOUNTER — Other Ambulatory Visit: Payer: Self-pay | Admitting: Pulmonary Disease

## 2021-09-17 ENCOUNTER — Other Ambulatory Visit: Payer: Self-pay

## 2021-09-17 MED ORDER — STIOLTO RESPIMAT 2.5-2.5 MCG/ACT IN AERS
2.0000 | INHALATION_SPRAY | Freq: Every day | RESPIRATORY_TRACT | 5 refills | Status: DC
  Filled 2021-09-17 – 2021-09-18 (×2): qty 4, fill #0
  Filled 2021-10-12: qty 12, 90d supply, fill #0

## 2021-09-17 NOTE — Progress Notes (Signed)
Request to change Stiolto from ONE puff TWICE daily to TWO puffs ONCE daily. This is now consistent with prior pulmonary note.

## 2021-09-18 ENCOUNTER — Other Ambulatory Visit: Payer: Self-pay

## 2021-09-18 ENCOUNTER — Telehealth: Payer: Self-pay | Admitting: Pharmacy Technician

## 2021-09-18 ENCOUNTER — Ambulatory Visit: Payer: Self-pay | Admitting: Licensed Clinical Social Worker

## 2021-09-18 DIAGNOSIS — F451 Undifferentiated somatoform disorder: Secondary | ICD-10-CM

## 2021-09-18 DIAGNOSIS — F411 Generalized anxiety disorder: Secondary | ICD-10-CM

## 2021-09-18 DIAGNOSIS — F341 Dysthymic disorder: Secondary | ICD-10-CM

## 2021-09-18 NOTE — Telephone Encounter (Signed)
Patient stated that she has been approved for SSI and Medicaid.  The Medicaid should go into effect 10/17/21.  Explained that [facility] could no longer provide medication assistance when patient obtains Medicaid.  Patient acknowledged that she understood.  Patient to let [facility] know what pharmacy to transfer prescriptions to once Medicaid is active.  [facility]

## 2021-09-18 NOTE — BH Specialist Note (Signed)
Integrated Behavioral Health via Telemedicine Visit  09/18/2021 Amber Livingston [id number]   Total time: 38  Referring Provider: [name], NP Patient/Family location: The patient's address [facility] Provider location: [facility] All persons participating in visit: Amber Livingston and Amber Livingston, MSW, LCSW-A Types of Service: Telephone visit  I connected with Amber Livingston via  Telephone or Video Enabled Telemedicine Application  (Video is Caregility application) and verified that I am speaking with the correct person using two identifiers. Discussed confidentiality: Yes   I discussed the limitations of telemedicine and the availability of in person appointments. Discussed there is a possibility of technology failure and discussed alternative modes of communication if that failure occurs.  Patient and/or legal guardian expressed understanding and consented to Telemedicine visit: Yes   Presenting Concerns: Patient and/or family reports the following symptoms/concerns: The patient reports that she has been doing the same since her last follow-up appointment. She described feeling fatigued due to a fibromyalgia flare. She stated that the weather impacts her pain levels and the recent switch between hot and cold has left her couch bound for the past three days. The patient discussed financial and other health stressors impacting her life. She shared that she was denied for [place] ([employer]), but was approved for [place] ([employer]). She explained that she is relieved on one hand, but concerned about losing her current providers and therapist. Amber Livingston shared that she  continues to take Cymbalta daily and trazodone as needed. She shared that she will see her primary care provider next week to discuss adding Suboxone for treatment of her fibromyalgia symptoms. Amber Livingston denied any suicidal or homicidal thoughts.  Duration of problem: Years; Severity  of problem: moderate  Patient and/or Family's Strengths/Protective Factors: Concrete supports in place (healthy food, safe environments, etc.)  Goals Addressed: Patient will:  Reduce symptoms of: agitation, anxiety, depression, insomnia, and stress   Increase knowledge and/or ability of: coping skills, healthy habits, self-management skills, and stress reduction   Demonstrate ability to: Increase healthy adjustment to current life circumstances and Increase adequate support systems for patient/family  Progress towards Goals: Ongoing  Interventions: Interventions utilized:  CBT Cognitive Behavioral Therapywas utilized by the clinician during today's follow up session. Clinician met with patient to identify needs related to stressors and functioning, and assess and monitor for signs and symptoms of anxiety and depression, and assess safety. The clinician processed with the patient how they have been doing since the last follow-up session. Clinician measured the patient's anxiety and depression on a numerical scale.  Clinician intervened with positive regard and optimism to validate client's emotions, and supported client in exploring ways to self advocate for her needs to be met. Clinician encouraged the patient to continue to work on calming techniques (e.g.. paced breathing, deep muscle relaxation, and calming imagery) as a strategy for responding appropriately to anxiety and the urge to avoid situations or self-isolate when they occur and move towards increasing the patients self regulation. The session ended with scheduling.  Standardized Assessments completed: GAD-7 and PHQ 9 GAD-7 = 11 PHQ-9 = 09   Assessment: Patient currently experiencing see above.   Patient may benefit from see above.  Plan: Follow up with behavioral health clinician on : 09/26/2021 at 11:00 PM  Behavioral recommendations:  Referral(s): [place] (In Clinic)  I discussed the  assessment and treatment plan with the patient and/or parent/guardian. They were provided an opportunity to ask questions and all were  answered. They agreed with the plan and demonstrated an understanding of the instructions.   They were advised to call back or seek an in-person evaluation if the symptoms worsen or if the condition fails to improve as anticipated.  Amber Livingston, LCSWA

## 2021-09-19 ENCOUNTER — Telehealth: Payer: Self-pay | Admitting: Pharmacy Technician

## 2021-09-19 NOTE — Telephone Encounter (Signed)
Received print out from pharmacy to check on delivery of Stiolto Respimat.  Atttempted to contact [employer] twice.  Was put on hold up to 45 minutes each time.  Second time, a message came on requesting that I leave a detailed message and someone from [employer] would call within 48 hours.  Patient verified that she has not received the medication at her home.  [facility] pharmacy stated that they have not received the medication either.  Provided patient a sample of the medication in December.  [facility]

## 2021-09-20 ENCOUNTER — Ambulatory Visit: Payer: Self-pay | Admitting: Gerontology

## 2021-09-20 ENCOUNTER — Other Ambulatory Visit: Payer: Self-pay

## 2021-09-20 IMAGING — CT CT CHEST LUNG CANCER SCREENING LOW DOSE W/O CM
2 of 5 series · 15 of 40 positions shown, 18 images · non-contrast
Comparison: No priors.

CLINICAL DATA: 59-year-old female former smoker (quit September 2020) with 46 pack-year history of smoking. Lung cancer screening
examination.

EXAM:
CT CHEST WITHOUT CONTRAST LOW-DOSE FOR LUNG CANCER SCREENING
TECHNIQUE: Multidetector CT imaging of the chest was performed following the
standard protocol without IV contrast.

[Series 3: lung 1.00 · axial · 0.64mm/px · z∈[-1267,-944]mm · 12 of 357 slices shown, 15 images]
[im 17/357  mediastinal]
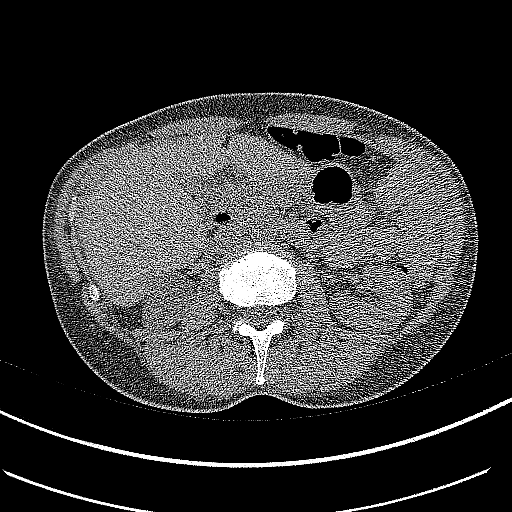
[im 17/357  lung]
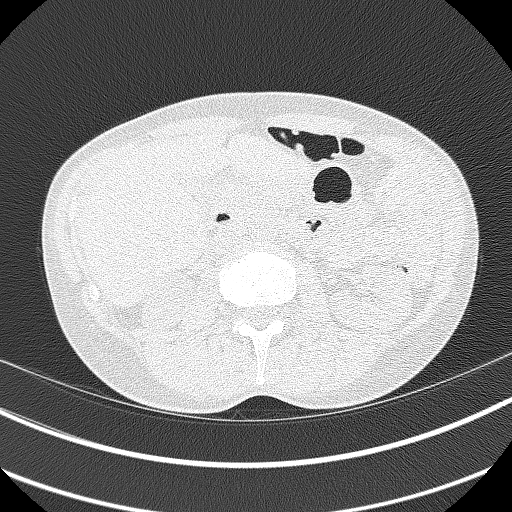
[im 49/357  lung]
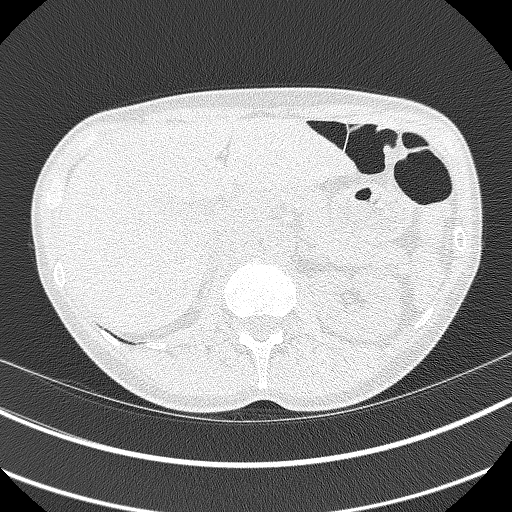
[im 81/357  lung]
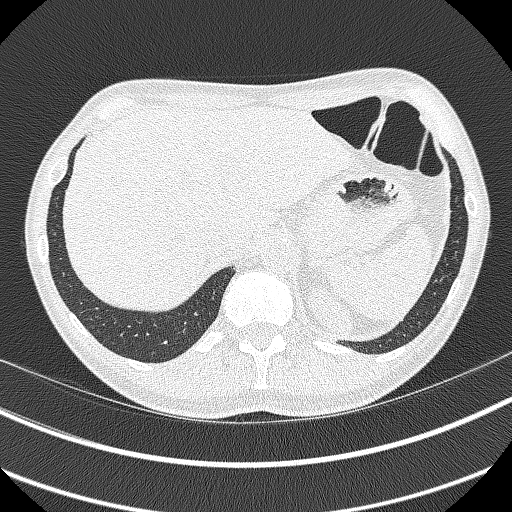
[im 114/357  lung]
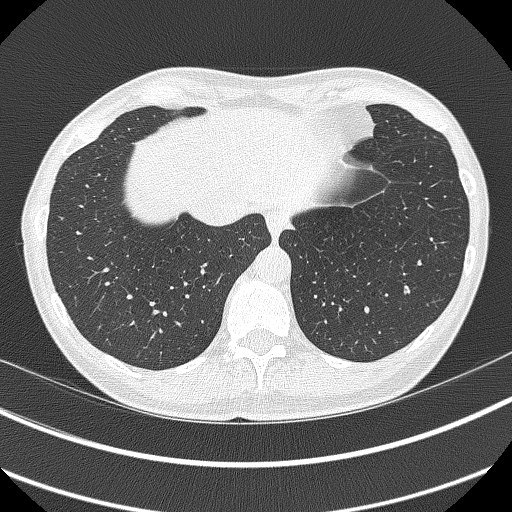
[im 130/357  mediastinal]
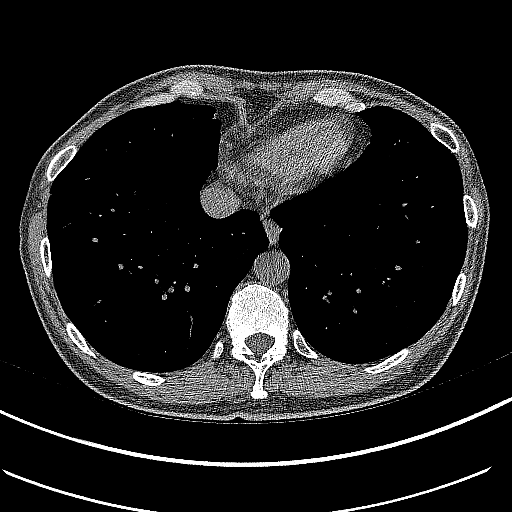
[im 130/357  lung]
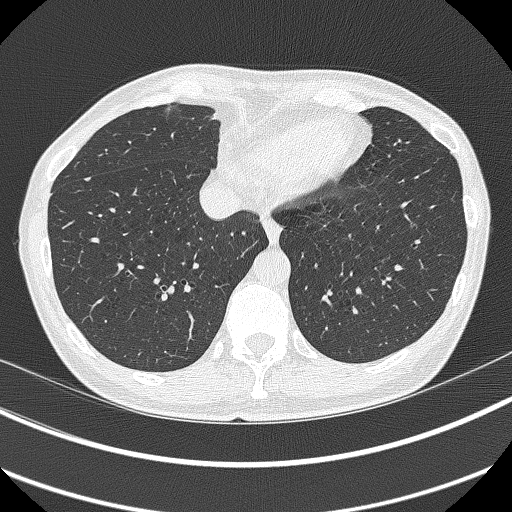
[im 162/357  lung]
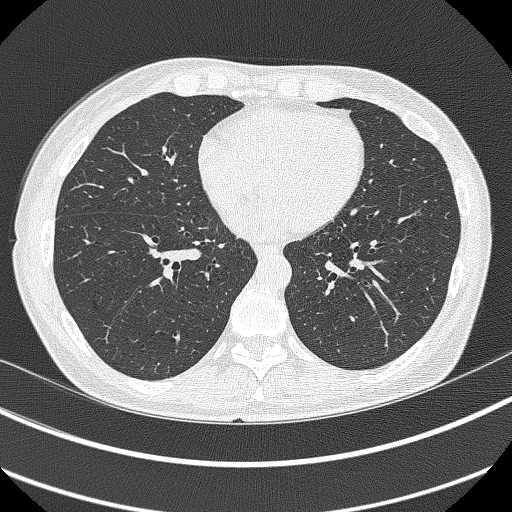
[im 195/357  lung]
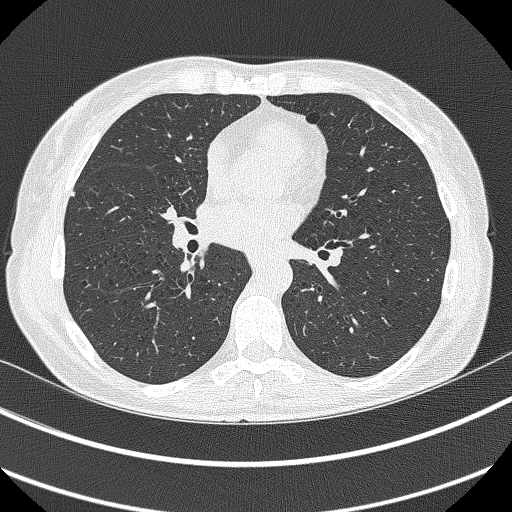
[im 227/357  lung]
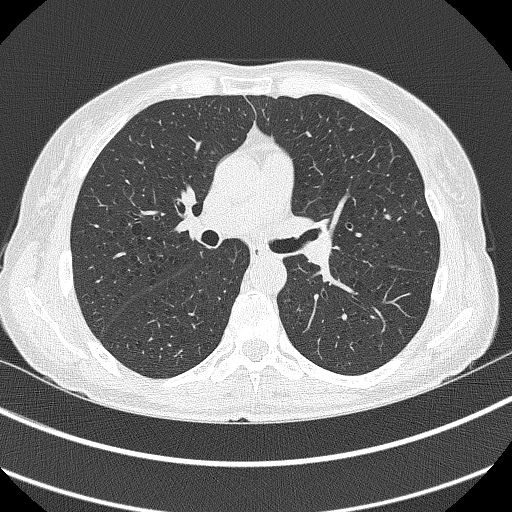
[im 243/357  mediastinal]
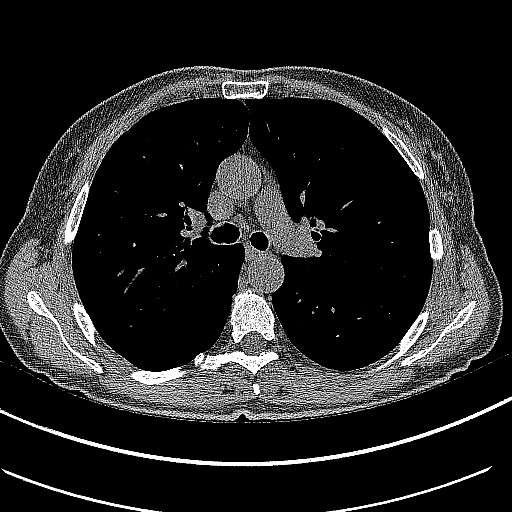
[im 243/357  lung]
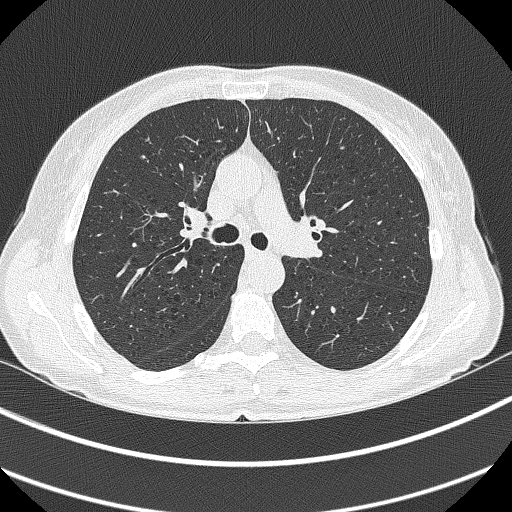
[im 276/357  lung]
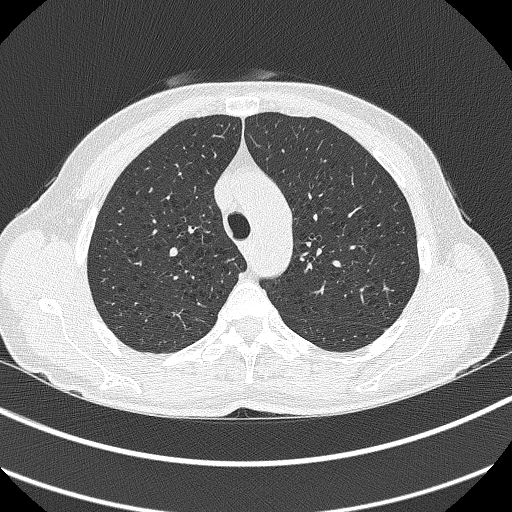
[im 308/357  lung]
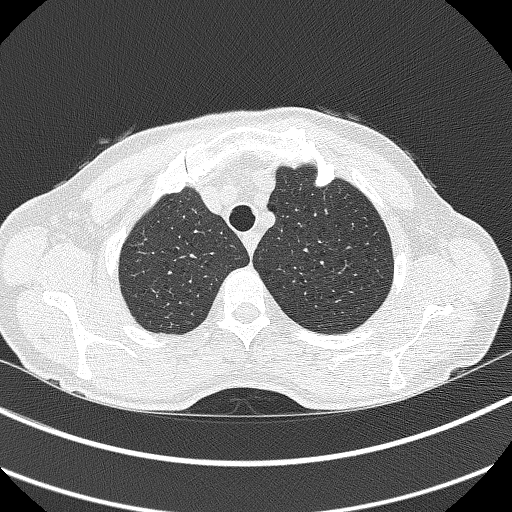
[im 340/357  lung]
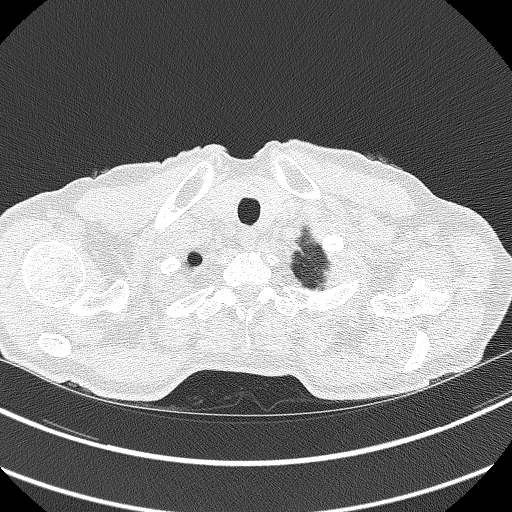

[Series 5: coronals lung 1.00 cor · coronal · 0.64mm/px · 3 of 237 slices shown]
[im 48/237  lung]
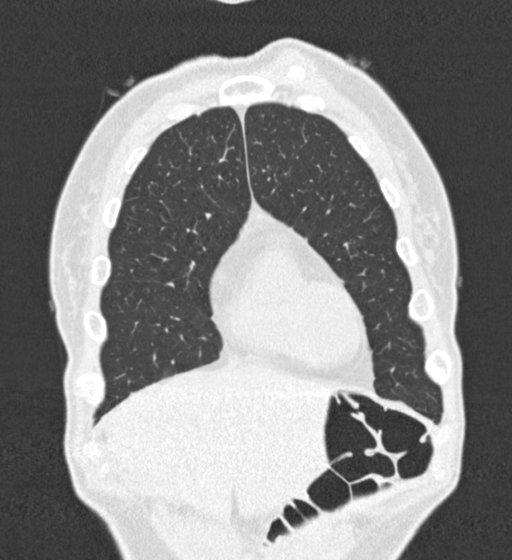
[im 95/237  lung]
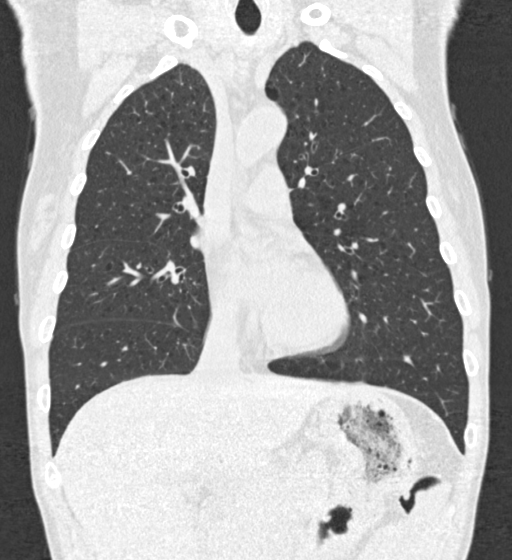
[im 142/237  lung]
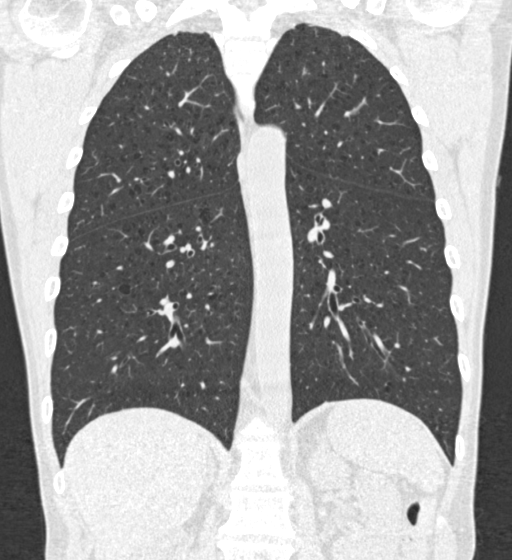

[15 of 40 positions shown; findings below may reference images not displayed]

FINDINGS: Cardiovascular: Heart size is normal. There is no significant
pericardial fluid, thickening or pericardial calcification. There is
aortic atherosclerosis, as well as atherosclerosis of the great
vessels of the mediastinum and the coronary arteries, including
calcified atherosclerotic plaque in the left anterior descending
coronary artery.

Mediastinum/Nodes: No pathologically enlarged mediastinal or hilar
lymph nodes. Please note that accurate exclusion of hilar adenopathy
is limited on noncontrast CT scans. Esophagus is unremarkable in
appearance. No axillary lymphadenopathy.

Lungs/Pleura: Multiple small pulmonary nodules are noted throughout
the lungs bilaterally, largest of which is in the posterior aspect
of the right lower lobe (axial image 289 of series 3), with a volume
derived mean diameter of 6.6 mm. No other larger more suspicious
appearing pulmonary nodules or masses are noted. No acute
consolidative airspace disease. No pleural effusions. Diffuse
bronchial wall thickening with mild centrilobular and paraseptal
emphysema.

Upper Abdomen: Aortic atherosclerosis.

Musculoskeletal: There are no aggressive appearing lytic or blastic
lesions noted in the visualized portions of the skeleton.
IMPRESSION: 1. Lung-RADS 3S, probably benign findings. Short-term follow-up in 6
months is recommended with repeat low-dose chest CT without contrast
(please use the following order, "CT CHEST LCS NODULE FOLLOW-UP W/O
CM").
2. The "S" modifier above refers to potentially clinically
significant non lung cancer related findings. Specifically, there is
aortic atherosclerosis, in addition to left anterior descending
coronary artery disease. Please note that although the presence of
coronary artery calcium documents the presence of coronary artery
disease, the severity of this disease and any potential stenosis
cannot be assessed on this non-gated CT examination. Assessment for
potential risk factor modification, dietary therapy or pharmacologic
therapy may be warranted, if clinically indicated.
3. Mild diffuse bronchial wall thickening with mild centrilobular
and paraseptal emphysema; imaging findings suggestive of underlying
COPD.

Aortic Atherosclerosis (CWX6Q-F8T.T) and Emphysema ([id number].T).

## 2021-09-20 NOTE — Progress Notes (Unsigned)
Established Patient Office Visit  Subjective:  Patient ID: Amber Livingston, female    DOB: 02-16-1961  Age: 61 y.o. MRN: [id number]  CC:  Chief Complaint  Patient presents with   Pain Management    Pt reports full body pain related to fibromyalgia; Pt reports self medicating pain with Cymblata (pt reported taking an additional 30mg  dose of Cymbalta today).      HPI Amber Livingston presents for ***  Past Medical History:  Diagnosis Date   Anxiety    Arterial atherosclerosis    Arthritis    Atherosclerosis    Cervical spinal stenosis    Depression    Emphysema, unspecified (HCC)    Fibromyalgia    Stage 7   Hyperlipidemia    Lumbar stenosis    Uterine fibroid     Past Surgical History:  Procedure Laterality Date   ablation     APPENDECTOMY     DILATION AND CURETTAGE OF UTERUS     x3 for miscarriage   LAPAROSCOPY     Fibroid removal   PALPITATION     TONSILLECTOMY      Family History  Problem Relation Age of Onset   Cancer Mother    Uterine cancer Mother 51   Lung cancer Mother    Brain cancer Mother    Hypertension Mother    Cancer Father    Dementia Father    Prostate cancer Father    Dementia Paternal Uncle    Alcohol abuse Maternal Grandmother    Breast cancer Paternal Grandmother 64   Dementia Paternal Grandfather     Social History   Socioeconomic History   Marital status: Single    Spouse name: Not on file   Number of children: Not on file   Years of education: Not on file   Highest education level: Not on file  Occupational History   Not on file  Tobacco Use   Smoking status: Former    Packs/day: 1.00    Years: 46.00    Pack years: 46.00    Types: Cigarettes    Quit date: 10/04/2020    Years since quitting: 0.9   Smokeless tobacco: Never   Tobacco comments:    verified 01/17/2021  Vaping Use   Vaping Use: Never used  Substance and Sexual Activity   Alcohol use: Not Currently   Drug use: Yes    Types: Marijuana, Other-see comments     Comment: Once daily only as needed, last use was 09/04/21. Using gummies.   Sexual activity: Not Currently    Partners: Male  Other Topics Concern   Not on file  Social History Narrative   Not on file   Social Determinants of Health   Financial Resource Strain: Not on file  Food Insecurity: No Food Insecurity   Worried About Running Out of Food in the Last Year: Never true   Ran Out of Food in the Last Year: Never true  Transportation Needs: No Transportation Needs   Lack of Transportation (Medical): No   Lack of Transportation (Non-Medical): No  Physical Activity: Not on file  Stress: Not on file  Social Connections: Not on file  Intimate Partner Violence: Not on file    Outpatient Medications Prior to Visit  Medication Sig Dispense Refill   BEE POLLEN PO Take 1 capsule by mouth daily.     Cyanocobalamin (B-12) 2500 MCG TABS Take by mouth daily.     D-Ribose (RIBOSE, D,) POWD 2,500 mg by Does not apply  route 3 (three) times daily.     diphenhydramine-acetaminophen (TYLENOL PM) 25-500 MG TABS tablet Take 2 tablets by mouth at bedtime as needed.     DULoxetine (CYMBALTA) 30 MG capsule Take 1 capsule (30 mg total) by mouth once daily. 30 capsule 1   estradiol (ESTRACE) 0.5 MG tablet Take 1 tablet (0.5 mg total) by mouth once daily. 90 tablet 3   ezetimibe (ZETIA) 10 MG tablet Take 1 tablet (10 mg total) by mouth once daily. 90 tablet 3   ibuprofen (ADVIL) 200 MG tablet Take 600 mg by mouth every 6 (six) hours as needed for moderate pain.     MAGNESIUM BISGLYCINATE PO Take 1,000 mg by mouth daily.     medroxyPROGESTERone (PROVERA) 2.5 MG tablet Take 1 tablet (2.5 mg total) by mouth once daily. 90 tablet 3   mometasone (ELOCON) 0.1 % ointment Apply topically as directed. Qhs to aa fingers hands up to 5 days a week until clear, then prn flares 45 g 1   nicotine polacrilex (COMMIT) 4 MG lozenge Take 4 mg by mouth as needed for smoking cessation.     NON FORMULARY as needed. CBD gummies      PROAIR HFA 108 (90 Base) MCG/ACT inhaler INHALE 2 PUFFS BY MOUTH EVERY 6 HOURS AS NEEDED FOR WHEEZING OR SHORT OF BREATH 34 g 11   Tiotropium Bromide-Olodaterol (STIOLTO RESPIMAT) 2.5-2.5 MCG/ACT AERS Inhale 2 puffs into the lungs once daily at 2 PM. 4 g 5   Turmeric (QC TUMERIC COMPLEX PO) Take 1,000 mg by mouth daily.     traZODone (DESYREL) 50 MG tablet TAKE 1/2 TABLET (25MG  TOTAL) BY MOUTH ONCE DAILY AT BEDTIME. 30 tablet 1   No facility-administered medications prior to visit.    No Known Allergies  ROS Review of Systems    Objective:    Physical Exam  BP 95/65 (BP Location: Left Arm, Patient Position: Sitting, Cuff Size: Normal)    Pulse 76    Temp 98.4 F (36.9 C) (Oral)    Wt 127 lb 14.4 oz (58 kg)    SpO2 96%    BMI 19.45 kg/m  Wt Readings from Last 3 Encounters:  09/20/21 127 lb 14.4 oz (58 kg)  07/31/21 123 lb 4.8 oz (55.9 kg)  07/25/21 121 lb 6.4 oz (55.1 kg)     Health Maintenance Due  Topic Date Due   HIV Screening  Never done   Hepatitis C Screening  Never done   TETANUS/TDAP  Never done   Zoster Vaccines- Shingrix (1 of 2) Never done   COVID-19 Vaccine (3 - Moderna risk series) 01/13/2020    There are no preventive care reminders to display for this patient.  Lab Results  Component Value Date   TSH 0.863 03/02/2020   Lab Results  Component Value Date   WBC 4.2 09/27/2020   HGB 12.7 09/27/2020   HCT 37.5 09/27/2020   MCV 94 09/27/2020   PLT 321 09/27/2020   Lab Results  Component Value Date   NA 141 09/27/2020   K 5.0 09/27/2020   CO2 21 09/27/2020   GLUCOSE 88 09/27/2020   BUN 11 09/27/2020   CREATININE 0.75 09/27/2020   BILITOT 0.5 03/02/2020   ALKPHOS 72 03/02/2020   AST 14 03/02/2020   ALT 19 09/05/2021   PROT 7.0 03/02/2020   ALBUMIN 4.7 03/02/2020   CALCIUM 9.7 09/27/2020   Lab Results  Component Value Date   CHOL 239 (H) 09/05/2021   Lab Results  Component Value Date   HDL 75 09/05/2021   Lab Results  Component  Value Date   LDLCALC 143 (H) 09/05/2021   Lab Results  Component Value Date   TRIG 118 09/05/2021   Lab Results  Component Value Date   CHOLHDL 3.2 09/05/2021   No results found for: HGBA1C    Assessment & Plan:   Problem List Items Addressed This Visit   None   No orders of the defined types were placed in this encounter.   Follow-up: No follow-ups on file.    Laurianne Floresca Jerold Coombe, NP

## 2021-09-26 ENCOUNTER — Ambulatory Visit: Payer: Self-pay | Admitting: Licensed Clinical Social Worker

## 2021-09-26 ENCOUNTER — Other Ambulatory Visit: Payer: Self-pay

## 2021-09-26 ENCOUNTER — Other Ambulatory Visit: Payer: Self-pay | Admitting: Gerontology

## 2021-09-26 DIAGNOSIS — F341 Dysthymic disorder: Secondary | ICD-10-CM

## 2021-09-26 DIAGNOSIS — F451 Undifferentiated somatoform disorder: Secondary | ICD-10-CM

## 2021-09-26 DIAGNOSIS — F411 Generalized anxiety disorder: Secondary | ICD-10-CM

## 2021-09-26 MED ORDER — DULOXETINE HCL 30 MG PO CPEP
30.0000 mg | ORAL_CAPSULE | Freq: Every day | ORAL | 0 refills | Status: DC
  Filled 2021-09-26 – 2021-09-28 (×3): qty 30, 30d supply, fill #0

## 2021-09-26 NOTE — BH Specialist Note (Signed)
Integrated Behavioral Health via Telemedicine Visit  09/26/2021 Amber Livingston [id number]   Referring Provider: [name], NP Patient/Family location: The patient's home address [facility] Provider location: [facility] [place] All persons participating in visit: Kele Solan and Jerrilyn Cairo, MSW, LCSW-A Types of Service: Telephone visit  I connected with Amber Livingston via  Telephone or Video Enabled Telemedicine Application  (Video is Caregility application) and verified that I am speaking with the correct person using two identifiers. Discussed confidentiality: Yes   I discussed the limitations of telemedicine and the availability of in person appointments.  Discussed there is a possibility of technology failure and discussed alternative modes of communication if that failure occurs.  Patient and/or legal guardian expressed understanding and consented to Telemedicine visit: Yes   Presenting Concerns: Patient and/or family reports the following symptoms/concerns: The patient reports that she has been doing the same since her follow-up appointment. She explained that she checked in  last week for her  appointment with her primary care provider and became overwhelmed with fatigue, pain, and anxiety and had to leave before seeing her provider. She explained that she had an urge to get home as fast as possible. Amber Livingston noted that she had experienced a significant fibromyalgia flare last week and spent several days in bed in severe pain. She explained she was told by a physician she saw at disability determination services that she should request Suboxone be added to her medication regime to help her with her pain levels and was hoping to accomplish this last week, but missed her appointment. Amber Livingston noted that her SSI appeal was approved and that she will be receiving medicaid soon. She discussed her concerns regarding changing primary and mental health providers. She stated that she is looking  forward to moving forward in her journey towards healing. The patient reported that she needs refills for Duloxetine and requested the clinician message her provider. Amber Livingston denied any suicidal or homicidal thoughts.  Duration of problem: Years; Severity of problem: moderate  Patient and/or Family's Strengths/Protective Factors: Social and Emotional competence and Concrete supports in place (healthy food, safe environments, etc.)  Goals Addressed: Patient will:  Reduce symptoms of: agitation, anxiety, depression, insomnia, and stress   Increase knowledge and/or ability of: coping skills, healthy habits, self-management skills, and stress reduction   Demonstrate ability to: Increase healthy adjustment to current life circumstances and Increase adequate support systems for patient/family  Progress towards Goals: Ongoing  Interventions: Interventions utilized:  CBT Cognitive Behavioral Therapy was utilized by the clinician during today's follow up session. Clinician met with patient to identify needs related to stressors and functioning, and assess and monitor for signs and symptoms of anxiety and depression, and assess safety. The clinician processed with the patient how they have been doing since the last follow-up session. Clinician measured the patient's anxiety and depression on a numerical scale. Clinician encouraged the patient to continue to work on calming techniques (e.g.. paced breathing, deep muscle relaxation, and calming imagery) as a strategy for responding appropriately to anxiety and the urge to avoid situations or self-isolate when they occur and move towards increasing the patients self regulation. Clinician answered the patient's questions regarding transitioning of her care. The session ended with scheduling.   Standardized Assessments completed: GAD-7 and PHQ 9 GAD-7 = 11 PHQ-9 = 12   Assessment: Patient currently experiencing see above.   Patient may benefit from see  above.  Plan: Follow up with behavioral health clinician on : 03/08/2020 at 3:00  PM (will be last session due to patient approved for medicaid) Behavioral recommendations:  Referral(s): [place] (In Clinic)  I discussed the assessment and treatment plan with the patient and/or parent/guardian. They were provided an opportunity to ask questions and all were answered. They agreed with the plan and demonstrated an understanding of the instructions.   They were advised to call back or seek an in-person evaluation if the symptoms worsen or if the condition fails to improve as anticipated.  Lesli Albee, LCSWA

## 2021-09-27 ENCOUNTER — Other Ambulatory Visit: Payer: Self-pay

## 2021-09-28 ENCOUNTER — Other Ambulatory Visit: Payer: Self-pay

## 2021-10-01 ENCOUNTER — Ambulatory Visit: Payer: Self-pay | Admitting: Dermatology

## 2021-10-03 ENCOUNTER — Other Ambulatory Visit: Payer: Self-pay

## 2021-10-03 ENCOUNTER — Ambulatory Visit: Payer: Self-pay

## 2021-10-03 ENCOUNTER — Telehealth: Payer: Self-pay | Admitting: Pharmacist

## 2021-10-03 NOTE — Telephone Encounter (Signed)
10/03/2021 1:47:13 PM - Stiolo Respimat call to [employer] -- Arletha Pili - Wednesday, October 03, 2021 1:47 PM --  Received note to follow up on Stiolo Respimat. We have been trying to get this since Oct. 2022. Spoke to [employer] with [employer]. Stated that the order never processed because they couldn't get in touch with the provider office to verify directions. He processed application  & expedited shipping to [place].Marland Kitchen Allow 3-5 business days.

## 2021-10-03 NOTE — Telephone Encounter (Signed)
Patient arrived for pharmD appointment but came at [facility]. Her apt was at 10:30. The front desk did not arrive her right away because they had appointment prior to hers to arrive. Patient said the automated machine told her to come at 9:45. She got tired of waiting and left. I was made aware by both check out and the front desk. Patient scheduled apt with Dr. Loletha Grayer in May. I called patient and explained the situation. She states she cant take the medications but was told she would have a heart attack if she didn't. She states she cant function on it. I advised that she can stopped the zetia and she has been rescheduled for 3/16.

## 2021-10-03 NOTE — Progress Notes (Unsigned)
Patient ID: Amber Livingston                 DOB: 02-21-1961                    MRN: [id number]     HPI: Amber Livingston is a 61 y.o. female patient referred to lipid clinic by Dr. Gasper Sells. PMH is significant for Uterine Ablation, Cannabis Abuse, aortic atherosclerosis and HLD. Patient was started on rosuvastatin 10mg  daily in Oct 2023. She only took a few doses and reported it made her feel horrible. She was then started on ezetimibe.  Current Medications: ezetimibe 10mg  daily Intolerances: rosuvastatin 10mg  daily Risk Factors: aortic atherosclerosis,  LDL goal: <70  Diet:   Exercise:   Family History: The patient's family history includes Alcohol abuse in her maternal grandmother; Brain cancer in her mother; Breast cancer (age of onset: 45) in her paternal grandmother; Dementia in her father, paternal grandfather, and paternal uncle; Hypertension in her mother; Lung cancer in her mother; Prostate cancer in her father; Uterine cancer (age of onset: 50) in her mother.  Social History:   Labs: 09/05/21 TC 239, TG 118, HDL 75, LDL 143 (no medications)  Past Medical History:  Diagnosis Date   Anxiety    Arterial atherosclerosis    Arthritis    Atherosclerosis    Cervical spinal stenosis    Depression    Emphysema, unspecified (HCC)    Fibromyalgia    Stage 7   Hyperlipidemia    Lumbar stenosis    Uterine fibroid     Current Outpatient Medications on File Prior to Visit  Medication Sig Dispense Refill   BEE POLLEN PO Take 1 capsule by mouth daily.     Cyanocobalamin (B-12) 2500 MCG TABS Take by mouth daily.     D-Ribose (RIBOSE, D,) POWD 2,500 mg by Does not apply route 3 (three) times daily.     diphenhydramine-acetaminophen (TYLENOL PM) 25-500 MG TABS tablet Take 2 tablets by mouth at bedtime as needed.     DULoxetine (CYMBALTA) 30 MG capsule Take 1 capsule (30 mg total) by mouth once daily. 30 capsule 0   estradiol (ESTRACE) 0.5 MG tablet Take 1 tablet (0.5 mg total) by mouth  once daily. 90 tablet 3   ezetimibe (ZETIA) 10 MG tablet Take 1 tablet (10 mg total) by mouth once daily. 90 tablet 3   ibuprofen (ADVIL) 200 MG tablet Take 600 mg by mouth every 6 (six) hours as needed for moderate pain.     MAGNESIUM BISGLYCINATE PO Take 1,000 mg by mouth daily.     medroxyPROGESTERone (PROVERA) 2.5 MG tablet Take 1 tablet (2.5 mg total) by mouth once daily. 90 tablet 3   mometasone (ELOCON) 0.1 % ointment Apply topically as directed. Qhs to aa fingers hands up to 5 days a week until clear, then prn flares 45 g 1   nicotine polacrilex (COMMIT) 4 MG lozenge Take 4 mg by mouth as needed for smoking cessation.     NON FORMULARY as needed. CBD gummies     PROAIR HFA 108 (90 Base) MCG/ACT inhaler INHALE 2 PUFFS BY MOUTH EVERY 6 HOURS AS NEEDED FOR WHEEZING OR SHORT OF BREATH 34 g 11   Tiotropium Bromide-Olodaterol (STIOLTO RESPIMAT) 2.5-2.5 MCG/ACT AERS Inhale 2 puffs into the lungs once daily at 2 PM. 4 g 5   traZODone (DESYREL) 50 MG tablet TAKE 1/2 TABLET (25MG  TOTAL) BY MOUTH ONCE DAILY AT BEDTIME. 30 tablet 1  Turmeric (QC TUMERIC COMPLEX PO) Take 1,000 mg by mouth daily.     No current facility-administered medications on file prior to visit.    No Known Allergies  Assessment/Plan:  1. Hyperlipidemia -    Thank you,   Ramond Dial, Pharm.D, BCPS, CPP [place]  [address], [place], [place] [address]  Phone: [phone]; Fax: [phone]

## 2021-10-08 ENCOUNTER — Other Ambulatory Visit: Payer: Self-pay

## 2021-10-09 ENCOUNTER — Ambulatory Visit: Payer: Medicaid Other | Admitting: Licensed Clinical Social Worker

## 2021-10-09 ENCOUNTER — Other Ambulatory Visit: Payer: Self-pay

## 2021-10-09 ENCOUNTER — Ambulatory Visit: Payer: Self-pay | Admitting: Licensed Clinical Social Worker

## 2021-10-09 DIAGNOSIS — F339 Major depressive disorder, recurrent, unspecified: Secondary | ICD-10-CM

## 2021-10-09 DIAGNOSIS — F451 Undifferentiated somatoform disorder: Secondary | ICD-10-CM

## 2021-10-09 DIAGNOSIS — F411 Generalized anxiety disorder: Secondary | ICD-10-CM

## 2021-10-09 NOTE — BH Specialist Note (Signed)
Integrated Behavioral Health via Telemedicine Visit  10/09/2021 Amber Livingston [id number]   Referring Provider: [name], NP  Patient/Family location: The patient's home address [facility] Provider location: [facility] [place] All persons participating in visit: Fareeha Micciche and Jerrilyn Cairo, MSW,LCSW-A Types of Service: Telephone visit  I connected with Shadiamond Pries Telephone or Video Enabled Telemedicine Application  (Video is Caregility application) and verified that I am speaking with the correct person using two identifiers. Discussed confidentiality: Yes   I discussed the limitations of telemedicine and the availability of in person appointments.  Discussed there is a possibility of technology failure and discussed alternative modes of communication if that failure occurs.  Patient and/or legal guardian expressed understanding and consented to Telemedicine visit: Yes   Presenting Concerns: Patient and/or family reports the following symptoms/concerns: The patient reports that she has been doing better since her last follow-up appointment. She shared that she has received her Medicaid card in the mail, but it had the wrong PCP listed so she is waiting on the new card to arrive. She noted that she was told her Medicaid would be retro active going back to May 2022. She shared that she is working to get her medical bills together and share her insurance information with her creditors. Prudy discussed ending therapy with the clinician and her  concerns regarding transitioning her care to new providers.  She shared feeling sad about leaving and expressed her gratitude for the care she has received. Izumi discussed other financial and health related stressors impacting her life. She denied any suicidal or homicidal thoughts.  Duration of problem: Years; Severity of problem: moderate  Patient and/or Family's Strengths/Protective Factors: Social connections, Concrete supports in place  (healthy food, safe environments, etc.), and Sense of purpose  Goals Addressed: Patient will:  Reduce symptoms of: agitation, anxiety, depression, insomnia, and stress   Increase knowledge and/or ability of: coping skills, healthy habits, self-management skills, and stress reduction   Demonstrate ability to: Increase healthy adjustment to current life circumstances and Increase adequate support systems for patient/family  Progress towards Goals: Other  Interventions: Interventions utilized:  CBT Cognitive Behavioral Therapywas utilized by the clinician during today's follow up session. Clinician met with patient to identify needs related to stressors and functioning, and assess and monitor for signs and symptoms of anxiety and depression, and assess safety. The clinician processed with the patient how they have been doing since the last follow-up session.  Clinician intervened with positive regard and optimism to validate client's emotions, and supported client in exploring ways to access community resources and increase social interactions. The clinician encouraged the patient to utilize their coping skills to deal with their current life circumstances. Clinician normalized the patients feelings regarding transitioning care and provided space for her to ask questions and addressed her concerns.   Standardized Assessments completed: GAD-7 and PHQ 9 GAD-7 = 11 PHQ-9 = 07  Assessment: Patient currently experiencing see above.   Patient may benefit from see above.  Plan: Follow up with behavioral health clinician on :  Behavioral recommendations:  Referral(s): [place] (LME/Outside Clinic) Referral options were provided to the patient and included : [facility], [facility], and [facility].  I discussed the assessment and treatment plan with the patient and/or parent/guardian. They were provided an opportunity to ask questions and all were answered. They agreed with the  plan and demonstrated an understanding of the instructions.   They were advised to call back or seek an in-person  evaluation if the symptoms worsen or if the condition fails to improve as anticipated.  Lesli Albee, LCSWA

## 2021-10-10 ENCOUNTER — Other Ambulatory Visit: Payer: Self-pay

## 2021-10-12 ENCOUNTER — Other Ambulatory Visit: Payer: Self-pay

## 2021-10-15 ENCOUNTER — Other Ambulatory Visit: Payer: Self-pay

## 2021-10-17 ENCOUNTER — Telehealth: Payer: Self-pay | Admitting: Pharmacy Technician

## 2021-10-17 NOTE — Telephone Encounter (Signed)
Patient stated that she now has Medicaid with prescription drug coverage.  Patient made aware that she no longer meets the eligibility criteria to receive medication assistance at [facility].  Patient acknowledged that she understood. ? ?Amber Livingston ?Care Manager ?[facility] ?

## 2021-10-18 ENCOUNTER — Other Ambulatory Visit: Payer: Self-pay

## 2021-10-22 ENCOUNTER — Other Ambulatory Visit: Payer: Self-pay

## 2021-10-22 ENCOUNTER — Ambulatory Visit: Payer: Medicaid Other | Admitting: Pulmonary Disease

## 2021-10-22 ENCOUNTER — Encounter: Payer: Self-pay | Admitting: Pulmonary Disease

## 2021-10-22 VITALS — BP 110/70 | HR 78 | Ht 68.0 in | Wt 121.6 lb

## 2021-10-22 DIAGNOSIS — Z23 Encounter for immunization: Secondary | ICD-10-CM | POA: Diagnosis not present

## 2021-10-22 DIAGNOSIS — R0981 Nasal congestion: Secondary | ICD-10-CM | POA: Diagnosis not present

## 2021-10-22 DIAGNOSIS — J432 Centrilobular emphysema: Secondary | ICD-10-CM

## 2021-10-22 MED ORDER — STIOLTO RESPIMAT 2.5-2.5 MCG/ACT IN AERS: 2.0000 | INHALATION_SPRAY | Freq: Every day | RESPIRATORY_TRACT | 5 refills | Status: DC

## 2021-10-22 NOTE — Progress Notes (Signed)
Subjective:   PATIENT ID: Amber Livingston GENDER: female DOB: 1961-06-12, MRN: [id number]   HPI  Chief Complaint  Patient presents with   Follow-up    Feeling better on stiolto    Reason for Visit: Follow-up  [name] is a 61 year old female former smoker (46 pack years) with SVT, HLD, spinal stenosis and atrial mass who presents for follow-up  Synopsis:  2021-2022: Revamped her life and changed her diet and starting to see doctors. She has had shortness of breath that limits her activity including vacuuming in her home. She has other chronic issues including back pain and fibromyalgia that affect her activity.  She recently had a CT lung screen completed on 11/07/20 which demonstrated emphysema and small multiple pulmonary nodules. She quit smoking February 2022. She is still taking lozenges. She uses edibles but does not smoke marijuana. Her shortness of breath has worsened in last 9 months. No wheezing or cough. She is not on inhalers.  02/27/21 She is in active fibromyalgia flare and being seen by a pain doctor but not prescribed anything at this time. For her COPD, Spiriva one puff twice a day. She uses albuterol once a day which is improved. Reports shortness of breath improved, occurs with exertion. Denies coughing, wheezing. She reports pulse oximetry with SpO2 96%. She is walking 2-3 times a week for 15 minutes.  05/21/21 She recently had fibromyalgia flare over the weekend. She takes Spiriva ONE puff in the morning and evening. Also takes albuterol 3-4 times a day. She chronic cough with sputum production. Shortness of breath with exertion. Rare wheezing. Symptoms worsen with changes in humidity, weather. She can walk 1.4 miles which she does 2-3 times a week; this is more activity since we last visited.  10/22/2021 Since our last visit she was started on Stioloto. She reports her productive cough, chest congestion, shortness of breath and wheezing has improved. She has morning  productive cough that is chronic for her. Denies nocturnal symptoms. She is starting to be more active. Not using her albuterol except as needed. She continues to have fatigue related to her fibromyalgia  Social History: Former smoker. 46 pack-year. 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     Cyanocobalamin (B-12) 2500 MCG TABS Take by mouth daily.     D-Ribose (RIBOSE, D,) POWD 2,500 mg by Does not apply route 3 (three) times daily.     diphenhydramine-acetaminophen (TYLENOL PM) 25-500 MG TABS tablet Take 2 tablets by mouth at bedtime as needed.     DULoxetine (CYMBALTA) 30 MG capsule Take 1 capsule (30 mg total) by mouth once daily. 30 capsule 0   estradiol (ESTRACE) 0.5 MG tablet Take 1 tablet (0.5 mg total) by mouth once daily. 90 tablet 3   ibuprofen (ADVIL) 200 MG tablet Take 600 mg by mouth every 6 (six) hours as needed for moderate pain.     MAGNESIUM BISGLYCINATE  PO Take 1,000 mg by mouth daily.     medroxyPROGESTERone (PROVERA) 2.5 MG tablet Take 1 tablet (2.5 mg total) by mouth once daily. 90 tablet 3   mometasone (ELOCON) 0.1 % ointment Apply topically as directed. Qhs to aa fingers hands up to 5 days a week until clear, then prn flares 45 g 1   nicotine polacrilex (COMMIT) 4 MG lozenge Take 4 mg by mouth as needed for smoking cessation.     NON FORMULARY as needed. CBD gummies     PROAIR HFA 108 (90 Base) MCG/ACT inhaler INHALE 2 PUFFS BY MOUTH EVERY 6 HOURS AS NEEDED FOR WHEEZING OR SHORT OF BREATH 34 g 11   Tiotropium  Bromide-Olodaterol (STIOLTO RESPIMAT) 2.5-2.5 MCG/ACT AERS Inhale 2 puffs into the lungs once daily at 2 PM. 4 g 5   traZODone (DESYREL) 50 MG tablet TAKE 1/2 TABLET ('25MG'$  TOTAL) BY MOUTH ONCE DAILY AT BEDTIME. 30 tablet 1   Turmeric (QC TUMERIC COMPLEX PO) Take 1,000 mg by mouth daily.     ezetimibe (ZETIA) 10 MG tablet Take 1 tablet (10 mg total) by mouth once daily. (Patient not taking: Reported on 10/22/2021) 90 tablet 3   No facility-administered medications prior to visit.    Review of Systems  Constitutional:  Negative for chills, diaphoresis, fever, malaise/fatigue and weight loss.  HENT:  Negative for congestion.   Respiratory:  Positive for cough and sputum production. Negative for hemoptysis, shortness of breath and wheezing.   Cardiovascular:  Negative for chest pain, palpitations and leg swelling.    Objective:   Vitals:   10/22/21 0932  BP: 110/70  Pulse: 78  SpO2: 99%  Weight: 121 lb 9.6 oz (55.2 kg)  Height: '5\' 8"'$  (1.727 m)   SpO2: 99 % O2 Device: None (Room air)  Physical Exam: General: Well-appearing, no acute distress HENT: [place], AT Eyes: EOMI, no scleral icterus Respiratory: Clear to auscultation bilaterally.  No crackles, wheezing or rales Cardiovascular: RRR, -M/R/G, no JVD Extremities:-Edema,-tenderness Neuro: AAO x4, CNII-XII grossly intact Psych: Normal mood, normal affect  Data Reviewed:  Imaging: CT chest lung screening 11/06/2020-multiple small lung nodules with largest measuring 6.6 mm in the right lower lobe.  Background emphysema CT Lung Screen 05/11/21 - Stable lung nodules. Emphysema  PFT: 02/27/21 FVC 3.54 (96%) FEV1 1.98 (69%) Ratio 65  TLC 112% DLCO 72% Interpretation: Moderate obstructive defect with mildly reduced gas exchanged. Normal lung volumes.  Labs: CBC    Component Value Date/Time   WBC 4.2 09/27/2020 1042   RBC 3.99 09/27/2020 1042   HGB 12.7 09/27/2020 1042   HCT 37.5 09/27/2020 1042   PLT 321 09/27/2020 1042   MCV 94  09/27/2020 1042   MCH 31.8 09/27/2020 1042   MCHC 33.9 09/27/2020 1042   RDW 12.5 09/27/2020 1042   LYMPHSABS 2.0 03/02/2020 1846   EOSABS 0.3 03/02/2020 1846   BASOSABS 0.1 03/02/2020 1846   Absolute eos 03/02/20 300    Assessment & Plan:   Discussion: 61 year old female former smoker with emphysema who presents for follow-up.  Since starting LABA/LAMA, her symptoms have significantly improved. No exacerbations in >6 months.  Shortness of breath - improved on Stiolto Can be multi factorial in setting of emphysema, fibromyalgia, deconditioning.  Management as noted below  Emphysema --CONTINUE Stiolto 2.5/2.5 mcg TWO puffs ONCE a day. REFILL --CONTINUE Albuterol as needed for shortness of breath or wheezing --Encourage regular activity five days a week for at least 20 min --Discussed vaccinations: Administer pneumococcal vaccine today  Lung nodules --Enrolled in lung cancer screening  Nasal congestion --Encourage OTC allergy medication (zyrtec or claritin as directed) and nasal spray for symptoms --Referral to ENT per patient request  Health Maintenance Immunization History  Administered Date(s) Administered   Influenza,inj,Quad PF,6+ Mos 05/21/2021   Moderna Sars-Covid-2 Vaccination 11/18/2019, 12/16/2019   CT Lung Screen -scheduled for 05/2022  Orders Placed This Encounter  Procedures   Pneumococcal polysaccharide vaccine 23-valent greater than or equal to 2yo subcutaneous/IM   Ambulatory referral to ENT    Referral Priority:   Routine    Referral Type:   Consultation    Referral Reason:   Specialty Services Required    Requested Specialty:   Otolaryngology    Number of Visits Requested:   1   Meds ordered this encounter  Medications   Tiotropium Bromide-Olodaterol (STIOLTO RESPIMAT) 2.5-2.5 MCG/ACT AERS    Sig: Inhale 2 puffs into the lungs once daily at 2 PM.    Dispense:  4 g    Refill:  5    Return in about 6 months (around 04/24/2022).  I have spent a  total time of 32-minutes on the day of the appointment reviewing prior documentation, coordinating care and discussing medical diagnosis and plan with the patient/family. Past medical history, allergies, medications were reviewed. Pertinent imaging, labs and tests included in this note have been reviewed and interpreted independently by me.  [place], MD [facility] 10/22/2021 9:39 AM  Office Number [phone]

## 2021-10-22 NOTE — Patient Instructions (Addendum)
Emphysema ?--CONTINUE Stiolto 2.5/2.5 mcg TWO puffs ONCE a day. REFILL ?--CONTINUE Albuterol as needed for shortness of breath or wheezing ?--Encourage regular activity five days a week for at least 20 min ? ?Nasal congestion ?--Encourage OTC allergy medication (zyrtec or claritin as directed) and nasal spray for symptoms ?--Referral to ENT ? ?Follow-up with me in 6 months ?

## 2021-10-24 ENCOUNTER — Other Ambulatory Visit: Payer: Self-pay

## 2021-10-29 ENCOUNTER — Other Ambulatory Visit: Payer: Self-pay

## 2021-10-30 ENCOUNTER — Encounter: Payer: Self-pay | Admitting: Gerontology

## 2021-10-30 ENCOUNTER — Other Ambulatory Visit: Payer: Self-pay

## 2021-10-30 ENCOUNTER — Ambulatory Visit: Payer: Self-pay | Admitting: Gerontology

## 2021-10-30 DIAGNOSIS — Z09 Encounter for follow-up examination after completed treatment for conditions other than malignant neoplasm: Secondary | ICD-10-CM | POA: Insufficient documentation

## 2021-10-30 NOTE — Progress Notes (Signed)
? ?Established Patient Office Visit ? ?Subjective:  ?Patient ID: Amber Livingston, female    DOB: December 02, 1960  Age: 61 y.o. MRN: [id number] ? ?CC:  ?Chief Complaint  ?Patient presents with  ? Follow-up  ?  Pt has insurance. Last visit. Pt thinks she has arthritis in the hands, starting Meloxicam. Negative for Rheumatoid arthritis and lupus. Seeing hand surgeon next week.  ? ? ?HPI ?Amber Livingston is a 61 y/o female who has a history of Arthritis, Cervical Spinal Stenosis, Fibromyalgia, Uterine Fibroid and Depression presents for medication refill. ?Her Medicaid is active, had her initial visit with PCP Dr Amber Livingston and reports that she is switching to another provider.  She was seen on 10/22/21 at the Pulmonology clinic by Dr Amber Livingston, she will continue on Stiolto inhaler daily, lung screening and follow up. ?She was started on 15 mg Meloxicam by Amber Racer PA-C at [facility] clinic for cyst on her left hand between her second and third fingers. She denies pain, and requests Cymbalta refill. She states that her mood is good, denies suicidal nor homicidal ideation. Overall, she states that she's doing well and offers no further complaint. ? ?Past Medical History:  ?Diagnosis Date  ? Anxiety   ? Arterial atherosclerosis   ? Arthritis   ? Atherosclerosis   ? Cervical spinal stenosis   ? Depression   ? Emphysema, unspecified ([place])   ? Fibromyalgia   ? Stage 7  ? Hyperlipidemia   ? Lumbar stenosis   ? Uterine fibroid   ? ? ?Past Surgical History:  ?Procedure Laterality Date  ? ablation    ? APPENDECTOMY    ? DILATION AND CURETTAGE OF UTERUS    ? x3 for miscarriage  ? LAPAROSCOPY    ? Fibroid removal  ? PALPITATION    ? TONSILLECTOMY    ? ? ?Family History  ?Problem Relation Age of Onset  ? Cancer Mother   ? Uterine cancer Mother 53  ? Lung cancer Mother   ? Brain cancer Mother   ? Hypertension Mother   ? Cancer Father   ? Dementia Father   ? Prostate cancer Father   ? Dementia Paternal Uncle   ? Alcohol abuse Maternal Grandmother    ? Breast cancer Paternal Grandmother 31  ? Dementia Paternal Grandfather   ? ? ?Social History  ? ?Socioeconomic History  ? Marital status: Single  ?  Spouse name: Not on file  ? Number of children: Not on file  ? Years of education: Not on file  ? Highest education level: Not on file  ?Occupational History  ? Not on file  ?Tobacco Use  ? Smoking status: Former  ?  Packs/day: 1.00  ?  Years: 46.00  ?  Pack years: 46.00  ?  Types: Cigarettes  ?  Quit date: 10/04/2020  ?  Years since quitting: 1.0  ? Smokeless tobacco: Never  ? Tobacco comments:  ?  verified 01/17/2021  ?Vaping Use  ? Vaping Use: Never used  ?Substance and Sexual Activity  ? Alcohol use: Not Currently  ? Drug use: Yes  ?  Types: Marijuana, Other-see comments  ?  Comment: Once daily only as needed, last use was 09/04/21. Using gummies.  ? Sexual activity: Not Currently  ?  Partners: Male  ?Other Topics Concern  ? Not on file  ?Social History Narrative  ? Not on file  ? ?Social Determinants of Health  ? ?Financial Resource Strain: Not on file  ?Food Insecurity:  No Food Insecurity  ? Worried About Charity fundraiser in the Last Year: Never true  ? Ran Out of Food in the Last Year: Never true  ?Transportation Needs: No Transportation Needs  ? Lack of Transportation (Medical): No  ? Lack of Transportation (Non-Medical): No  ?Physical Activity: Not on file  ?Stress: Not on file  ?Social Connections: Not on file  ?Intimate Partner Violence: Not on file  ? ? ?Outpatient Medications Prior to Visit  ?Medication Sig Dispense Refill  ? BEE POLLEN PO Take 1 capsule by mouth daily.    ? Cyanocobalamin (B-12) 2500 MCG TABS Take by mouth daily.    ? D-Ribose (RIBOSE, D,) POWD 2,500 mg by Does not apply route 3 (three) times daily.    ? diphenhydramine-acetaminophen (TYLENOL PM) 25-500 MG TABS tablet Take 2 tablets by mouth at bedtime as needed.    ? DULoxetine (CYMBALTA) 30 MG capsule Take 1 capsule (30 mg total) by mouth once daily. 30 capsule 0  ? estradiol (ESTRACE)  0.5 MG tablet Take 1 tablet (0.5 mg total) by mouth once daily. 90 tablet 3  ? ibuprofen (ADVIL) 200 MG tablet Take 600 mg by mouth every 6 (six) hours as needed for moderate pain.    ? MAGNESIUM BISGLYCINATE PO Take 1,000 mg by mouth daily.    ? medroxyPROGESTERone (PROVERA) 2.5 MG tablet Take 1 tablet (2.5 mg total) by mouth once daily. 90 tablet 3  ? melatonin 1 MG TABS tablet Take 2 mg by mouth at bedtime.    ? meloxicam (MOBIC) 15 MG tablet Take 15 mg by mouth daily.    ? mometasone (ELOCON) 0.1 % ointment Apply topically as directed. Qhs to aa fingers hands up to 5 days a week until clear, then prn flares 45 g 1  ? PROAIR HFA 108 (90 Base) MCG/ACT inhaler INHALE 2 PUFFS BY MOUTH EVERY 6 HOURS AS NEEDED FOR WHEEZING OR SHORT OF BREATH 34 g 11  ? Tiotropium Bromide-Olodaterol (STIOLTO RESPIMAT) 2.5-2.5 MCG/ACT AERS Inhale 2 puffs into the lungs once daily at 2 PM. 4 g 5  ? Turmeric (QC TUMERIC COMPLEX PO) Take 1,000 mg by mouth daily.    ? ezetimibe (ZETIA) 10 MG tablet Take 1 tablet (10 mg total) by mouth once daily. (Patient not taking: Reported on 10/22/2021) 90 tablet 3  ? nicotine polacrilex (COMMIT) 4 MG lozenge Take 4 mg by mouth as needed for smoking cessation.    ? NON FORMULARY as needed. CBD gummies    ? traZODone (DESYREL) 50 MG tablet TAKE 1/2 TABLET ('25MG'$  TOTAL) BY MOUTH ONCE DAILY AT BEDTIME. 30 tablet 1  ? ?No facility-administered medications prior to visit.  ? ? ?No Known Allergies ? ?ROS ?Review of Systems  ?Constitutional: Negative.   ?Respiratory: Negative.    ?Cardiovascular: Negative.   ?Neurological: Negative.   ?Psychiatric/Behavioral: Negative.    ? ?  ?Objective:  ?  ?Physical Exam ?HENT:  ?   Head: Normocephalic and atraumatic.  ?Cardiovascular:  ?   Rate and Rhythm: Normal rate and regular rhythm.  ?   Pulses: Normal pulses.  ?   Heart sounds: Normal heart sounds.  ?Pulmonary:  ?   Effort: Pulmonary effort is normal.  ?   Breath sounds: Normal breath sounds.  ?Neurological:  ?    General: No focal deficit present.  ?   Mental Status: She is alert and oriented to person, place, and time. Mental status is at baseline.  ?Psychiatric:     ?  Mood and Affect: Mood normal.     ?   Behavior: Behavior normal.     ?   Thought Content: Thought content normal.     ?   Judgment: Judgment normal.  ? ? ?BP 126/81 (BP Location: Right Arm, Patient Position: Sitting, Cuff Size: Normal)   Pulse 66   Temp 97.6 ?F (36.4 ?C)   Ht '5\' 8"'$  (1.727 m)   Wt 124 lb 4.8 oz (56.4 kg)   SpO2 98%   BMI 18.90 kg/m?  ?Wt Readings from Last 3 Encounters:  ?10/30/21 124 lb 4.8 oz (56.4 kg)  ?10/22/21 121 lb 9.6 oz (55.2 kg)  ?09/20/21 127 lb 14.4 oz (58 kg)  ? ? ? ?Health Maintenance Due  ?Topic Date Due  ? HIV Screening  Never done  ? Hepatitis C Screening  Never done  ? TETANUS/TDAP  Never done  ? Zoster Vaccines- Shingrix (1 of 2) Never done  ? COVID-19 Vaccine (3 - Moderna risk series) 01/13/2020  ? ? ?There are no preventive care reminders to display for this patient. ? ?Lab Results  ?Component Value Date  ? TSH 0.863 03/02/2020  ? ?Lab Results  ?Component Value Date  ? WBC 4.2 09/27/2020  ? HGB 12.7 09/27/2020  ? HCT 37.5 09/27/2020  ? MCV 94 09/27/2020  ? PLT 321 09/27/2020  ? ?Lab Results  ?Component Value Date  ? NA 141 09/27/2020  ? K 5.0 09/27/2020  ? CO2 21 09/27/2020  ? GLUCOSE 88 09/27/2020  ? BUN 11 09/27/2020  ? CREATININE 0.75 09/27/2020  ? BILITOT 0.5 03/02/2020  ? ALKPHOS 72 03/02/2020  ? AST 14 03/02/2020  ? ALT 19 09/05/2021  ? PROT 7.0 03/02/2020  ? ALBUMIN 4.7 03/02/2020  ? CALCIUM 9.7 09/27/2020  ? ?Lab Results  ?Component Value Date  ? CHOL 239 (H) 09/05/2021  ? ?Lab Results  ?Component Value Date  ? HDL 75 09/05/2021  ? ?Lab Results  ?Component Value Date  ? LDLCALC 143 (H) 09/05/2021  ? ?Lab Results  ?Component Value Date  ? TRIG 118 09/05/2021  ? ?Lab Results  ?Component Value Date  ? CHOLHDL 3.2 09/05/2021  ? ?No results found for: HGBA1C ? ?  ?Assessment & Plan:  ? ?1. Follow-up exam ?- We  are unable to renew her Cymbalta since she has established care with a Medicaid provider. This will be her last visit, advised her to notify Dr Leandro Reasoner to refill Cymbalta, and to notify Crisis help line for worsen

## 2021-11-01 ENCOUNTER — Ambulatory Visit (INDEPENDENT_AMBULATORY_CARE_PROVIDER_SITE_OTHER): Payer: Medicaid Other | Admitting: Pharmacist

## 2021-11-01 ENCOUNTER — Other Ambulatory Visit: Payer: Self-pay

## 2021-11-01 DIAGNOSIS — I700 Atherosclerosis of aorta: Secondary | ICD-10-CM | POA: Diagnosis not present

## 2021-11-01 DIAGNOSIS — E782 Mixed hyperlipidemia: Secondary | ICD-10-CM | POA: Diagnosis not present

## 2021-11-01 LAB — LIPID PANEL
Chol/HDL Ratio: 3.2 ratio (ref 0.0–4.4)
Cholesterol, Total: 229 mg/dL — ABNORMAL HIGH (ref 100–199)
HDL: 72 mg/dL (ref >39)
LDL Chol Calc (NIH): 139 mg/dL — ABNORMAL HIGH (ref 0–99)
Triglycerides: 104 mg/dL (ref 0–149)
VLDL Cholesterol Cal: 18 mg/dL (ref 5–40)

## 2021-11-01 MED ORDER — PRAVASTATIN SODIUM 20 MG PO TABS: 20.0000 mg | ORAL_TABLET | Freq: Every evening | ORAL | 11 refills | Status: DC

## 2021-11-01 NOTE — Patient Instructions (Signed)
Your LDL goal is < 70 ? ?We'll recheck your baseline labs today ? ?Start taking pravastatin '20mg'$  daily ? ?Let us know if you don't tolerate pravastatin well. We can try an injectable medication called Repatha next ? ?

## 2021-11-01 NOTE — Progress Notes (Signed)
Patient ID: Amber Livingston                 DOB: August 24, 1960                    MRN: [id number] ? ? ? ? ?HPI: ?Amber Livingston is a 60 y.o. female patient referred to lipid clinic by Dr Gasper Sells. PMH is significant for HLD, aortic atherosclerosis and left anterior descending CAD noted on chest CT 10/2020, cannabis and tobacco use, atrial mass, and SVT with resting bradycardia. TTE 09/2020 showed EF 60-65%. She was started on rosuvastatin '10mg'$  daily 05/2021 but reported stopping it after 4 days due to feeling horrible. She was started on ezetimibe '10mg'$  daily instead. ? ?Pt reports sensitivity to medications due to her fibromyalgia. Started on meloxicam which has helped with her pain. Still fatigued on a regular basis, reports drinking coffee all day. Took rosuvastatin for 4 days, reports a feeling of unwellness, felt horrible and sick. Then took ezetimibe and stayed on this for 3-4 months despite feeling poorly. Felt drugged like she couldn't do anything. Wishes to have baseline labs checked today. ? ?Current Medications: none ?Intolerances: rosuvastatin 5-'10mg'$  daily, ezetimibe '10mg'$  daily ?Risk Factors: aortic atherosclerosis and CAD, tobacco abuse ?LDL goal: '70mg'$ /dL ? ?Diet: no bread, dairy, meat or anything with additives ? ?Exercise: was walking daily, PT for back arthritis ? ?Family History: Former tobacco use 1 PPD for 46 years, quit in 2022, marijuana use, denies alcohol use. ? ?Social History: Alcohol abuse in her maternal grandmother; Brain cancer in her mother; Breast cancer (age of onset: 59) in her paternal grandmother; Dementia in her father, paternal grandfather, and paternal uncle; Hypertension in her mother; Lung cancer in her mother; Prostate cancer in her father; Uterine cancer (age of onset: 58) in her mother. ? ?Labs: ?09/05/21: TC 239, TG 118, HDL 75, LDL 143 (ezetimibe '10mg'$  daily) ?07/25/21: TC 260, TG 83, HDL 73, LDL 173 (no LLT) ? ?Past Medical History:  ?Diagnosis Date  ? Anxiety   ? Arterial  atherosclerosis   ? Arthritis   ? Atherosclerosis   ? Cervical spinal stenosis   ? Depression   ? Emphysema, unspecified ([place])   ? Fibromyalgia   ? Stage 7  ? Hyperlipidemia   ? Lumbar stenosis   ? Uterine fibroid   ? ? ?Current Outpatient Medications on File Prior to Visit  ?Medication Sig Dispense Refill  ? BEE POLLEN PO Take 1 capsule by mouth daily.    ? Cyanocobalamin (B-12) 2500 MCG TABS Take by mouth daily.    ? D-Ribose (RIBOSE, D,) POWD 2,500 mg by Does not apply route 3 (three) times daily.    ? diphenhydramine-acetaminophen (TYLENOL PM) 25-500 MG TABS tablet Take 2 tablets by mouth at bedtime as needed.    ? DULoxetine (CYMBALTA) 30 MG capsule Take 1 capsule (30 mg total) by mouth once daily. 30 capsule 0  ? estradiol (ESTRACE) 0.5 MG tablet Take 1 tablet (0.5 mg total) by mouth once daily. 90 tablet 3  ? ezetimibe (ZETIA) 10 MG tablet Take 1 tablet (10 mg total) by mouth once daily. (Patient not taking: Reported on 10/22/2021) 90 tablet 3  ? ibuprofen (ADVIL) 200 MG tablet Take 600 mg by mouth every 6 (six) hours as needed for moderate pain.    ? MAGNESIUM BISGLYCINATE PO Take 1,000 mg by mouth daily.    ? medroxyPROGESTERone (PROVERA) 2.5 MG tablet Take 1 tablet (2.5 mg total) by mouth once daily. 90 tablet  3  ? melatonin 1 MG TABS tablet Take 2 mg by mouth at bedtime.    ? meloxicam (MOBIC) 15 MG tablet Take 15 mg by mouth daily.    ? mometasone (ELOCON) 0.1 % ointment Apply topically as directed. Qhs to aa fingers hands up to 5 days a week until clear, then prn flares 45 g 1  ? nicotine polacrilex (COMMIT) 4 MG lozenge Take 4 mg by mouth as needed for smoking cessation.    ? NON FORMULARY as needed. CBD gummies    ? PROAIR HFA 108 (90 Base) MCG/ACT inhaler INHALE 2 PUFFS BY MOUTH EVERY 6 HOURS AS NEEDED FOR WHEEZING OR SHORT OF BREATH 34 g 11  ? Tiotropium Bromide-Olodaterol (STIOLTO RESPIMAT) 2.5-2.5 MCG/ACT AERS Inhale 2 puffs into the lungs once daily at 2 PM. 4 g 5  ? traZODone (DESYREL) 50 MG  tablet TAKE 1/2 TABLET ('25MG'$  TOTAL) BY MOUTH ONCE DAILY AT BEDTIME. 30 tablet 1  ? Turmeric (QC TUMERIC COMPLEX PO) Take 1,000 mg by mouth daily.    ? ?No current facility-administered medications on file prior to visit.  ? ? ?No Known Allergies ? ?Assessment/Plan: ? ?1. Hyperlipidemia - Baseline LDL in the 170s above goal < 70 due to aortic atherosclerosis. Pt intolerant to rosuvastatin and ezetimibe. She wishes to have updated baseline labs checked today. Will complete this and also start pravastatin '20mg'$  daily. Advised her to contact clinic if she doesn't tolerate pravastatin and would try Repatha next. Reviewed injection technique today in clinic if med is needed in the future, but insurance requires rechallenge with 2nd statin first. If she does well on pravastatin, would plan to titrate to max tolerated dose and can recheck labs at next MD appt. ? ?Amber Livingston, PharmD, BCACP, CPP ?[id number], [place], [place] [address] ?Phone: [phone]; Fax: [phone] ?11/01/2021 8:53 AM ? ? ? ?

## 2021-11-05 ENCOUNTER — Other Ambulatory Visit
Admission: RE | Admit: 2021-11-05 | Discharge: 2021-11-05 | Disposition: A | Discharge status: Home / Self Care | Payer: Medicaid Other | Source: Ambulatory Visit | Attending: Internal Medicine | Admitting: Internal Medicine

## 2021-11-05 DIAGNOSIS — E785 Hyperlipidemia, unspecified: Secondary | ICD-10-CM | POA: Diagnosis present

## 2021-11-05 DIAGNOSIS — R5383 Other fatigue: Secondary | ICD-10-CM | POA: Insufficient documentation

## 2021-11-05 DIAGNOSIS — E559 Vitamin D deficiency, unspecified: Secondary | ICD-10-CM | POA: Diagnosis not present

## 2021-11-05 LAB — BASIC METABOLIC PANEL
Anion gap: 7 (ref 5–15)
BUN: 10 mg/dL (ref 6–20)
CO2: 26 mmol/L (ref 22–32)
Calcium: 9.5 mg/dL (ref 8.9–10.3)
Chloride: 101 mmol/L (ref 98–111)
Creatinine, Ser: 0.88 mg/dL (ref 0.44–1.00)
GFR, Estimated: >60 mL/min (ref >60)
Glucose, Bld: 81 mg/dL (ref 70–99)
Potassium: 4.6 mmol/L (ref 3.5–5.1)
Sodium: 134 mmol/L — ABNORMAL LOW (ref 135–145)

## 2021-11-05 LAB — CBC WITH DIFFERENTIAL/PLATELET
Abs Immature Granulocytes: 0.01 10*3/uL (ref 0.00–0.07)
Basophils Absolute: 0.1 10*3/uL (ref 0.0–0.1)
Basophils Relative: 1 %
Eosinophils Absolute: 0.2 10*3/uL (ref 0.0–0.5)
Eosinophils Relative: 5 %
HCT: 38.5 % (ref 36.0–46.0)
Hemoglobin: 12.9 g/dL (ref 12.0–15.0)
Immature Granulocytes: 0 %
Lymphocytes Relative: 20 %
Lymphs Abs: 0.9 10*3/uL (ref 0.7–4.0)
MCH: 31.1 pg (ref 26.0–34.0)
MCHC: 33.5 g/dL (ref 30.0–36.0)
MCV: 92.8 fL (ref 80.0–100.0)
Monocytes Absolute: 0.4 10*3/uL (ref 0.1–1.0)
Monocytes Relative: 8 %
Neutro Abs: 3 10*3/uL (ref 1.7–7.7)
Neutrophils Relative %: 66 %
Platelets: 323 10*3/uL (ref 150–400)
RBC: 4.15 MIL/uL (ref 3.87–5.11)
RDW: 11.8 % (ref 11.5–15.5)
WBC: 4.6 10*3/uL (ref 4.0–10.5)
nRBC: 0 % (ref 0.0–0.2)

## 2021-11-05 LAB — HEPATIC FUNCTION PANEL
ALT: 20 U/L (ref 0–44)
AST: 18 U/L (ref 15–41)
Albumin: 4.4 g/dL (ref 3.5–5.0)
Alkaline Phosphatase: 43 U/L (ref 38–126)
Bilirubin, Direct: <0.1 mg/dL (ref 0.0–0.2)
Total Bilirubin: 0.8 mg/dL (ref 0.3–1.2)
Total Protein: 7.6 g/dL (ref 6.5–8.1)

## 2021-11-05 LAB — TSH: TSH: 1.927 u[IU]/mL (ref 0.350–4.500)

## 2021-11-05 LAB — LIPID PANEL
Cholesterol: 243 mg/dL — ABNORMAL HIGH (ref 0–200)
HDL: 79 mg/dL (ref >40)
LDL Cholesterol: 152 mg/dL — ABNORMAL HIGH (ref 0–99)
Total CHOL/HDL Ratio: 3.1 RATIO
Triglycerides: 62 mg/dL (ref <150)
VLDL: 12 mg/dL (ref 0–40)

## 2021-11-05 LAB — MAGNESIUM: Magnesium: 2 mg/dL (ref 1.7–2.4)

## 2021-11-05 LAB — VITAMIN D 25 HYDROXY (VIT D DEFICIENCY, FRACTURES): Vit D, 25-Hydroxy: 52.14 ng/mL (ref 30–100)

## 2021-11-13 ENCOUNTER — Telehealth: Payer: Self-pay | Admitting: Pharmacist

## 2021-11-13 NOTE — Telephone Encounter (Signed)
LM for pt to discuss. These should be baseline labs before pt started on pravastatin. Will need pravastatin tolerability update if she's started on her statin. If intolerant, will pursue Repatha (also intolerant to rosuvastatin 5-'10mg'$  daily and ezetimibe '10mg'$  daily). ?

## 2021-11-15 NOTE — Telephone Encounter (Signed)
Called pt again. She answered then said she was running late to her PCP. Asked her to call me back at her convenience with the # I left her on prior voicemail. She stated her girlfriend has cancer and she just got back in town from a flight and hung up on me. ?

## 2021-11-16 ENCOUNTER — Other Ambulatory Visit: Payer: Self-pay

## 2021-11-29 ENCOUNTER — Ambulatory Visit: Payer: Medicaid Other | Admitting: Family Medicine

## 2021-12-10 ENCOUNTER — Encounter: Payer: Self-pay | Admitting: Emergency Medicine

## 2021-12-10 ENCOUNTER — Emergency Department
Admission: EM | Admit: 2021-12-10 | Discharge: 2021-12-10 | Disposition: A | Discharge status: Home / Self Care | Payer: Medicaid Other | Attending: Emergency Medicine | Admitting: Emergency Medicine

## 2021-12-10 ENCOUNTER — Other Ambulatory Visit: Payer: Self-pay

## 2021-12-10 DIAGNOSIS — Z760 Encounter for issue of repeat prescription: Secondary | ICD-10-CM | POA: Insufficient documentation

## 2021-12-10 DIAGNOSIS — F341 Dysthymic disorder: Secondary | ICD-10-CM

## 2021-12-10 MED ORDER — DULOXETINE HCL 30 MG PO CPEP: 30.0000 mg | ORAL_CAPSULE | Freq: Every day | ORAL | 1 refills | Status: DC

## 2021-12-10 NOTE — ED Triage Notes (Signed)
Pt via POV from home. Pt here for refill on her Cymbalta. Pt is A&Ox4 and NAD ?

## 2021-12-10 NOTE — ED Provider Notes (Signed)
? ?[facility] ?Provider Note ? ? ? Event Date/Time  ? First MD Initiated Contact with Patient 12/10/21 [phone]   ?  (approximate) ? ? ?History  ? ?Medication Refill ? ? ?HPI ? ?Amber Livingston is a 61 y.o. female   presents to the ED for refill on her Cymbalta.  [facility] but now that she has received Medicaid she has her first appointment with a PCP this week.  Patient has been trying to make her medication last until her appointment time but did run out.  Patient has a history of anxiety, depression, fibromyalgia, lumbar stenosis, SVT, and chronic bronchitis. ? ?  ? ? ?Physical Exam  ? ?Triage Vital Signs: ?ED Triage Vitals  ?Enc Vitals Group  ?   BP 12/10/21 0946 131/70  ?   Pulse Rate 12/10/21 0946 67  ?   Resp 12/10/21 0946 17  ?   Temp 12/10/21 0946 98.5 ?F (36.9 ?C)  ?   Temp Source 12/10/21 0946 Oral  ?   SpO2 12/10/21 0946 99 %  ?   Weight 12/10/21 0942 120 lb (54.4 kg)  ?   Height 12/10/21 0942 '5\' 8"'$  (1.727 m)  ?   Head Circumference --   ?   Peak Flow --   ?   Pain Score 12/10/21 0942 0  ?   Pain Loc --   ?   Pain Edu? --   ?   Excl. in [place]? --   ? ? ?Most recent vital signs: ?Vitals:  ? 12/10/21 0946  ?BP: 131/70  ?Pulse: 67  ?Resp: 17  ?Temp: 98.5 ?F (36.9 ?C)  ?SpO2: 99%  ? ? ? ?General: Awake, no distress.  ?CV:  Good peripheral perfusion.  Heart regular rate and rhythm. ?Resp:  Normal effort.  Lungs are clear. ?Abd:  No distention.  ?Other:   ? ? ?ED Results / Procedures / Treatments  ? ?Labs ?(all labs ordered are listed, but only abnormal results are displayed) ?Labs Reviewed - No data to display ? ? ? ? ?PROCEDURES: ? ?Critical Care performed:  ? ?Procedures ? ? ?MEDICATIONS ORDERED IN ED: ?Medications - No data to display ? ? ?IMPRESSION / MDM / ASSESSMENT AND PLAN / ED COURSE  ?I reviewed the triage vital signs and the nursing notes. ? ? ?Differential diagnosis includes, but is not limited to, medication refill. ? ?61 year old female presents to  the ED for refill of her Cymbalta that she has been taking for a long time.  [facility] she received her [facility] and now has an appointment with a PCP in [place].  Patient does have an appointment this week that she is waiting for and has been trying to make her Cymbalta last that long but was unable to.  She denies any other symptoms.  Patient does have a bottle that does verify that she has been taking Cymbalta 30 mg daily.  A prescription for the same was sent to her pharmacy and she is encouraged to keep the appointment so that she can get medication refills from her PCP. ? ? ? ?  ? ? ?FINAL CLINICAL IMPRESSION(S) / ED DIAGNOSES  ? ?Final diagnoses:  ?Encounter for medication refill  ? ? ? ?Rx / DC Orders  ? ?ED Discharge Orders   ? ?      Ordered  ?  DULoxetine (CYMBALTA) 30 MG capsule  Daily       ?  12/10/21 1032  ? ?  ?  ? ?  ? ? ? ?Note:  This document was prepared using Dragon voice recognition software and may include unintentional dictation errors. ?  ?Johnn Hai, PA-C ?12/10/21 1039 ? ?  ?Vanessa Thaxton, MD ?12/13/21 1528 ? ?

## 2021-12-10 NOTE — Discharge Instructions (Signed)
Keep your appointment with your primary care provider as previously scheduled.  Medication was sent to your pharmacy. ?

## 2021-12-13 DIAGNOSIS — M51369 Other intervertebral disc degeneration, lumbar region without mention of lumbar back pain or lower extremity pain: Secondary | ICD-10-CM | POA: Insufficient documentation

## 2021-12-13 DIAGNOSIS — I10 Essential (primary) hypertension: Secondary | ICD-10-CM | POA: Insufficient documentation

## 2021-12-13 DIAGNOSIS — M159 Polyosteoarthritis, unspecified: Secondary | ICD-10-CM | POA: Insufficient documentation

## 2021-12-17 NOTE — Progress Notes (Signed)
?Cardiology Office Note:   ? ?Date:  12/18/2021  ? ?ID:  Amber Livingston, DOB 07-06-1961, MRN [id number] ? ?PCP:  Pcp, No  ?[facility] Cardiologist:  Werner Lean, MD  ?[facility] Electrophysiologist:  None  ? ?CC: Follow up new medications ? ?History of Present Illness:   ? ?Amber Livingston is a 61 y.o. female with a hx of Uterine Ablation, Cannabis Abuse, and HLD who presents for evaluation 08/26/19.  In interim of this visit, patient had echo with possible atrial mass; MR suggestive of lipomatous hypertrophy.  Negative Stress test.  In interval, repeat echo shows persistent interatrial changes.   ?2022- felt terrible on rosuvastatin and zetia.   ?2023- Started on pravastatin 20 mg in lipid clinic.  Felt terrible on this medication and has stopped it as well. ? ?Patient notes that she is busy. ?Recently lost her brother  ?Now has Medicaid.  ?Since day prior/last visit notes  that the pravastatin and fatigued . ?Had ED visit for medications.  ?Started Cymbalta and has had no palpitations. ? ?No chest pain or pressure .  No SOB/DOE and no PND/Orthopnea.  No weight gain or leg swelling.  No palpitations or syncope. ? ? ?Past Medical History:  ?Diagnosis Date  ? Anxiety   ? Arterial atherosclerosis   ? Arthritis   ? Atherosclerosis   ? Cervical spinal stenosis   ? Depression   ? Emphysema, unspecified ([place])   ? Fibromyalgia   ? Stage 7  ? Hyperlipidemia   ? Lumbar stenosis   ? Uterine fibroid   ? ? ?Past Surgical History:  ?Procedure Laterality Date  ? ablation    ? APPENDECTOMY    ? DILATION AND CURETTAGE OF UTERUS    ? x3 for miscarriage  ? LAPAROSCOPY    ? Fibroid removal  ? PALPITATION    ? TONSILLECTOMY    ? ? ?Current Medications: ?Current Meds  ?Medication Sig  ? BEE POLLEN PO Take 1 capsule by mouth daily.  ? Cyanocobalamin (B-12) 2500 MCG TABS Take by mouth daily.  ? D-Ribose (RIBOSE, D,) POWD 2,500 mg by Does not apply route 3 (three) times daily.  ? diphenhydramine-acetaminophen (TYLENOL PM) 25-500 MG  TABS tablet Take 2 tablets by mouth at bedtime as needed.  ? DULoxetine (CYMBALTA) 30 MG capsule Take 1 capsule (30 mg total) by mouth once daily.  ? estradiol (ESTRACE) 0.5 MG tablet Take 1 tablet (0.5 mg total) by mouth once daily.  ? MAGNESIUM BISGLYCINATE PO Take 1,000 mg by mouth daily.  ? medroxyPROGESTERone (PROVERA) 2.5 MG tablet Take 1 tablet (2.5 mg total) by mouth once daily.  ? melatonin 1 MG TABS tablet Take 2 mg by mouth at bedtime.  ? meloxicam (MOBIC) 15 MG tablet Take 15 mg by mouth daily.  ? mometasone (ELOCON) 0.1 % ointment Apply topically as directed. Qhs to aa fingers hands up to 5 days a week until clear, then prn flares  ? nicotine polacrilex (COMMIT) 4 MG lozenge Take 4 mg by mouth as needed for smoking cessation.  ? NON FORMULARY as needed. CBD gummies  ? pantoprazole (PROTONIX) 40 MG tablet Take 40 mg by mouth daily as needed.  ? PROAIR HFA 108 (90 Base) MCG/ACT inhaler INHALE 2 PUFFS BY MOUTH EVERY 6 HOURS AS NEEDED FOR WHEEZING OR SHORT OF BREATH  ? Tiotropium Bromide-Olodaterol (STIOLTO RESPIMAT) 2.5-2.5 MCG/ACT AERS Inhale 2 puffs into the lungs once daily at 2 PM.  ? Turmeric (QC TUMERIC COMPLEX PO) Take 1,000  mg by mouth daily.  ? [DISCONTINUED] ibuprofen (ADVIL) 200 MG tablet Take 600 mg by mouth every 6 (six) hours as needed for moderate pain.  ?  ? ?Allergies:   Pravastatin, Rosuvastatin, and Zetia [ezetimibe]  ? ?Social History  ? ?Socioeconomic History  ? Marital status: Single  ?  Spouse name: Not on file  ? Number of children: Not on file  ? Years of education: Not on file  ? Highest education level: Not on file  ?Occupational History  ? Not on file  ?Tobacco Use  ? Smoking status: Former  ?  Packs/day: 1.00  ?  Years: 46.00  ?  Pack years: 46.00  ?  Types: Cigarettes  ?  Quit date: 10/04/2020  ?  Years since quitting: 1.2  ? Smokeless tobacco: Never  ? Tobacco comments:  ?  verified 01/17/2021  ?Vaping Use  ? Vaping Use: Never used  ?Substance and Sexual Activity  ? Alcohol use:  Not Currently  ? Drug use: Yes  ?  Types: Marijuana, Other-see comments  ?  Comment: Once daily only as needed, last use was 09/04/21. Using gummies.  ? Sexual activity: Not Currently  ?  Partners: Male  ?Other Topics Concern  ? Not on file  ?Social History Narrative  ? Not on file  ? ?Social Determinants of Health  ? ?Financial Resource Strain: Not on file  ?Food Insecurity: No Food Insecurity  ? Worried About Charity fundraiser in the Last Year: Never true  ? Ran Out of Food in the Last Year: Never true  ?Transportation Needs: No Transportation Needs  ? Lack of Transportation (Medical): No  ? Lack of Transportation (Non-Medical): No  ?Physical Activity: Not on file  ?Stress: Not on file  ?Social Connections: Not on file  ?  ? ?Family History: ?The patient's family history includes Alcohol abuse in her maternal grandmother; Brain cancer in her mother; Breast cancer (age of onset: 60) in her paternal grandmother; Cancer in her father and mother; Dementia in her father, paternal grandfather, and paternal uncle; Hypertension in her mother; Lung cancer in her mother; Prostate cancer in her father; Uterine cancer (age of onset: 76) in her mother. ?Brother had early onset Alzheimers.  Her passe aware in early 2023. ? ?ROS:   ?Please see the history of present illness.    ? All other systems reviewed and are negative. ? ?EKGs/Labs/Other Studies Reviewed:   ? ?The following studies were reviewed today: ? ?EKG:   ? ?08/25/20: SR 64 WNL ?07/06/2020: Sinus 65 WNL ? ?CMR: ?Date: 10/25/20 ?Results: ?IMPRESSION: ?1. 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 Reasonable for repeating ?imaging follow up (echocardiogram or CMR) in 6 months to confirm no ?change in interval. ? ?Transthoracic Echocardiogram: ?Date: 09/21/20 ?Results:  ?IMPRESSIONS  ? 1. Left ventricular ejection fraction, by estimation, is 60 to 65%. The  ?left ventricle has normal function. The left ventricle  has no regional  ?wall motion abnormalities. Left ventricular diastolic parameters were  ?normal.  ? 2. Right ventricular systolic function is normal. The right ventricular  ?size is normal.  ? 3. There is a prominent projection of the intra-atrial septum into the  ?right atrium, best seen in image 35.  ? 4. The mitral valve is normal in structure. Mild mitral valve  ?regurgitation. No evidence of mitral stenosis.  ? 5. The aortic valve is tricuspid. Aortic valve regurgitation is not  ?visualized. No aortic stenosis is present.  ? ? ?  ECG Stress Testing : ?Date: 09/21/20 ?Results: ?Exercise time: 6:37 min ?Workload: 8 METS, average exercise capacity ?Test stopped due to: fatigue, sob, patient request ?BP response: normal  ?HR response: blunted HR response (peak HR 130 - 80% max HR) ?Rhythm: SR ?  ?Suboptimal HR response to exercise. No ischemia on stress ECG at suboptimal peak heart rate, which may reduce the sensitivity of this study to detect ischemia.  ? ?Cardiac Event Monitoring ?Date: 09/25/20 ?Results: ?Patient had a minimum heart rate of 39 bpm, maximum heart rate of 160 bpm, and average heart rate of 66 bpm. ?Predominant underlying rhythm was sinus rhythm. ?Four runs of supraventricular tachycardia occurred lasting 16 beats at longest with a max rate of 160 bpm at fastest. ?Isolated PACs were rare (<1.0%), with rare couplets present. ?Isolated PVCs were rare (<1.0%), with rare couplets and bigeminy present. ?No evidence of complete heart block. ?Triggered and diary events associated with sinus rhythm, one episodes of SVT, and rare PVC. ?  ?Occasionally symptomatic SVT. ? ?Recent Labs: ?11/05/2021: ALT 20; BUN 10; Creatinine, Ser 0.88; Hemoglobin 12.9; Magnesium 2.0; Platelets 323; Potassium 4.6; Sodium 134; TSH 1.927  ?Recent Lipid Panel ?   ?Component Value Date/Time  ? CHOL 243 (H) 11/05/2021 5573  ? CHOL 229 (H) 11/01/2021 0849  ? TRIG 62 11/05/2021 0907  ? HDL 79 11/05/2021 0907  ? HDL 72 11/01/2021 0849  ?  CHOLHDL 3.1 11/05/2021 0907  ? VLDL 12 11/05/2021 0907  ? [place] 152 (H) 11/05/2021 2202  ? [place] 139 (H) 11/01/2021 0849  ? ? ?Physical Exam:   ? ?VS:  BP 130/68   Pulse 62   Ht '5\' 7"'$  (1.702 m)   Wt

## 2021-12-18 ENCOUNTER — Ambulatory Visit (INDEPENDENT_AMBULATORY_CARE_PROVIDER_SITE_OTHER): Payer: Medicaid Other | Admitting: Internal Medicine

## 2021-12-18 ENCOUNTER — Encounter: Payer: Self-pay | Admitting: Internal Medicine

## 2021-12-18 ENCOUNTER — Telehealth: Payer: Self-pay | Admitting: Pharmacist

## 2021-12-18 VITALS — BP 130/68 | HR 62 | Ht 67.0 in | Wt 123.0 lb

## 2021-12-18 DIAGNOSIS — I471 Supraventricular tachycardia: Secondary | ICD-10-CM

## 2021-12-18 DIAGNOSIS — E782 Mixed hyperlipidemia: Secondary | ICD-10-CM | POA: Diagnosis not present

## 2021-12-18 DIAGNOSIS — I700 Atherosclerosis of aorta: Secondary | ICD-10-CM

## 2021-12-18 MED ORDER — REPATHA SURECLICK 140 MG/ML ~~LOC~~ SOAJ: 1.0000 "pen " | SUBCUTANEOUS | 11 refills | Status: DC

## 2021-12-18 NOTE — Telephone Encounter (Signed)
Pt followed up with MD today, reported side effects on pravastatin ("felt terrible"). Also felt poorly on low dose rosuvastatin and ezetimibe. Will submit prior authorization for Repatha and follow up with pt once determination is made. ?

## 2021-12-18 NOTE — Patient Instructions (Signed)
Medication Instructions:  ?Your physician recommends that you continue on your current medications as directed. Please refer to the Current Medication list given to you today. ?REMOVED: Pravastatin from your medication list ?*If you need a refill on your cardiac medications before your next appointment, please call your pharmacy* ? ? ?Lab Work: ?NONE ?If you have labs (blood work) drawn today and your tests are completely normal, you will receive your results only by: ?MyChart Message (if you have MyChart) OR ?A paper copy in the mail ?If you have any lab test that is abnormal or we need to change your treatment, we will call you to review the results. ? ? ?Testing/Procedures: ?NONE ? ? ?Follow-Up: ?At [facility], you and your health needs are our priority.  As part of our continuing mission to provide you with exceptional heart care, we have created designated Provider Care Teams.  These Care Teams include your primary Cardiologist (physician) and Advanced Practice Providers (APPs -  Physician Assistants and Nurse Practitioners) who all work together to provide you with the care you need, when you need it. ? ? ?Your next appointment:   ?6 month(s) ? ?The format for your next appointment:   ?In Person ? ?Provider:   ?Werner Lean, MD   ? ? ?Important Information About Sugar ? ? ? ? ?  ?

## 2021-12-18 NOTE — Telephone Encounter (Signed)
Repatha prior authorization approved through 12/18/22. Rx sent to pharmacy. Called pt to discuss. Reviewed injection technique over the phone. Pt is agreeable to start on Repatha. Has annual wellness check in July, will have labs rechecked then. She'll call with any concerns beforehand and was appreciative for the assistance. ?

## 2022-01-11 ENCOUNTER — Other Ambulatory Visit: Payer: Self-pay | Admitting: Surgery

## 2022-01-18 ENCOUNTER — Encounter
Admission: RE | Admit: 2022-01-18 | Discharge: 2022-01-18 | Disposition: A | Discharge status: Home / Self Care | Payer: Medicaid Other | Source: Ambulatory Visit | Attending: Surgery | Admitting: Surgery

## 2022-01-18 HISTORY — DX: Supraventricular tachycardia: I47.1

## 2022-01-18 HISTORY — DX: Gastro-esophageal reflux disease without esophagitis: K21.9

## 2022-01-18 HISTORY — DX: Supraventricular tachycardia, unspecified: I47.10

## 2022-01-18 HISTORY — DX: Other ill-defined heart diseases: I51.89

## 2022-01-18 NOTE — Patient Instructions (Signed)
Your procedure is scheduled on:01-23-22 Wednesday Report to the Registration Desk on the 1st floor of the [facility].Then proceed to the 2nd floor Surgery Desk To find out your arrival time, please call [phone] between 1PM - 3PM on:01-22-22 Tuesday If your arrival time is 6:00 am, do not arrive prior to that time as the [facility] entrance doors do not open until 6:00 am.  REMEMBER: Instructions that are not followed completely may result in serious medical risk, up to and including death; or upon the discretion of your surgeon and anesthesiologist your surgery may need to be rescheduled.  Do not eat food after midnight the night before surgery.  No gum chewing, lozengers or hard candies.  You may however, drink CLEAR liquids up to 2 hours before you are scheduled to arrive for your surgery. Do not drink anything within 2 hours of your scheduled arrival time.  Clear liquids include: - water  - apple juice without pulp - gatorade (not RED colors) - black coffee or tea (Do NOT add milk or creamers to the coffee or tea) Do NOT drink anything that is not on this list  In addition, your doctor has ordered for you to drink the provided  Ensure Pre-Surgery Clear Carbohydrate Drink  Drinking this carbohydrate drink up to two hours before surgery helps to reduce insulin resistance and improve patient outcomes. Please complete drinking 2 hours prior to scheduled arrival time.  TAKE THESE MEDICATIONS THE MORNING OF SURGERY WITH A SIP OF WATER: -DULoxetine (CYMBALTA)  -pantoprazole (PROTONIX)-take one the night before and one on the morning of surgery - helps to prevent nausea after surgery.  Use your STIOLTO and Proair Inhaler the day of surgery and bring your Albuterol Inhaler to the hopsital One week prior to surgery: Stop Anti-inflammatories (NSAIDS) such as Meloxicam (Mobic), Advil, Aleve, Ibuprofen, Motrin, Naproxen, Naprosyn and Aspirin based products such as Excedrin, Goodys Powder,  BC Powder.You may however, take Tylenol if needed for pain up until the day of surgery.  Stop ANY OVER THE COUNTER supplements/vitamins NOW (01-18-22) until after surgery (Bee Pollen, Vitamin B12, D-Ribose, Magnesium, Turmeric)-You may continue your Melatonin up until the night prior to surgery   No Alcohol for 24 hours before or after surgery.  No Smoking including e-cigarettes for 24 hours prior to surgery.  No chewable tobacco products for at least 6 hours prior to surgery.  No nicotine patches on the day of surgery.  Do not use any "recreational" drugs for at least a week prior to your surgery.  Please be advised that the combination of cocaine and anesthesia may have negative outcomes, up to and including death. If you test positive for cocaine, your surgery will be cancelled.  On the morning of surgery brush your teeth with toothpaste and water, you may rinse your mouth with mouthwash if you wish. Do not swallow any toothpaste or mouthwash.  Use CHG Soap as directed on instruction sheet.  Do not wear jewelry, make-up, hairpins, clips or nail polish.  Do not wear lotions, powders, or perfumes.   Do not shave body from the neck down 48 hours prior to surgery just in case you cut yourself which could leave a site for infection.  Also, freshly shaved skin may become irritated if using the CHG soap.  Contact lenses, hearing aids and dentures may not be worn into surgery.  Do not bring valuables to the hospital. [facility] is not responsible for any missing/lost belongings or valuables.   Notify  your doctor if there is any change in your medical condition (cold, fever, infection).  Wear comfortable clothing (specific to your surgery type) to the hospital.  After surgery, you can help prevent lung complications by doing breathing exercises.  Take deep breaths and cough every 1-2 hours. Your doctor may order a device called an Incentive Spirometer to help you take deep breaths. When  coughing or sneezing, hold a pillow firmly against your incision with both hands. This is called "splinting." Doing this helps protect your incision. It also decreases belly discomfort.  If you are being admitted to the hospital overnight, leave your suitcase in the car. After surgery it may be brought to your room.  If you are being discharged the day of surgery, you will not be allowed to drive home. You will need a responsible adult (18 years or older) to drive you home and stay with you that night.   If you are taking public transportation, you will need to have a responsible adult (18 years or older) with you. Please confirm with your physician that it is acceptable to use public transportation.   Please call the [facility] at [phone] if you have any questions about these instructions.  Surgery Visitation Policy:  Patients undergoing a surgery or procedure may have two family members or support persons with them as long as the person is not COVID-19 positive or experiencing its symptoms.

## 2022-01-22 MED ORDER — LACTATED RINGERS IV SOLN: INTRAVENOUS | Status: DC

## 2022-01-22 MED ORDER — CHLORHEXIDINE GLUCONATE 0.12 % MT SOLN
15.0000 mL | Freq: Once | OROMUCOSAL | Status: AC
  Administered 2022-01-23: 15 mL via OROMUCOSAL

## 2022-01-22 MED ORDER — CEFAZOLIN SODIUM-DEXTROSE 2-4 GM/100ML-% IV SOLN
2.0000 g | INTRAVENOUS | Status: AC
  Administered 2022-01-23: 2 g via INTRAVENOUS

## 2022-01-22 MED ORDER — ORAL CARE MOUTH RINSE: 15.0000 mL | Freq: Once | OROMUCOSAL | Status: AC

## 2022-01-23 ENCOUNTER — Ambulatory Visit
Admission: RE | Admit: 2022-01-23 | Discharge: 2022-01-23 | Disposition: A | Discharge status: Home / Self Care | Payer: Medicaid Other | Attending: Surgery | Admitting: Surgery

## 2022-01-23 ENCOUNTER — Encounter
Admission: RE | Disposition: A | Discharge status: Home / Self Care | Payer: Self-pay | Source: Home / Self Care | Attending: Surgery

## 2022-01-23 ENCOUNTER — Encounter: Payer: Self-pay | Admitting: Surgery

## 2022-01-23 ENCOUNTER — Ambulatory Visit: Payer: Medicaid Other | Admitting: Certified Registered"

## 2022-01-23 ENCOUNTER — Other Ambulatory Visit: Payer: Self-pay

## 2022-01-23 DIAGNOSIS — M797 Fibromyalgia: Secondary | ICD-10-CM | POA: Insufficient documentation

## 2022-01-23 DIAGNOSIS — F418 Other specified anxiety disorders: Secondary | ICD-10-CM | POA: Diagnosis not present

## 2022-01-23 DIAGNOSIS — J449 Chronic obstructive pulmonary disease, unspecified: Secondary | ICD-10-CM | POA: Diagnosis not present

## 2022-01-23 DIAGNOSIS — D2112 Benign neoplasm of connective and other soft tissue of left upper limb, including shoulder: Secondary | ICD-10-CM | POA: Diagnosis present

## 2022-01-23 DIAGNOSIS — Z87891 Personal history of nicotine dependence: Secondary | ICD-10-CM | POA: Diagnosis not present

## 2022-01-23 DIAGNOSIS — K219 Gastro-esophageal reflux disease without esophagitis: Secondary | ICD-10-CM | POA: Insufficient documentation

## 2022-01-23 DIAGNOSIS — L309 Dermatitis, unspecified: Secondary | ICD-10-CM

## 2022-01-23 DIAGNOSIS — F341 Dysthymic disorder: Secondary | ICD-10-CM

## 2022-01-23 HISTORY — PX: MASS EXCISION: SHX2000

## 2022-01-23 SURGERY — EXCISION MASS
Anesthesia: General | Site: Hand | Laterality: Left

## 2022-01-23 MED ORDER — PHENYLEPHRINE 80 MCG/ML (10ML) SYRINGE FOR IV PUSH (FOR BLOOD PRESSURE SUPPORT)
PREFILLED_SYRINGE | INTRAVENOUS | Status: DC | PRN
  Administered 2022-01-23: 160 ug via INTRAVENOUS

## 2022-01-23 MED ORDER — GLYCOPYRROLATE 0.2 MG/ML IJ SOLN
INTRAMUSCULAR | Status: DC | PRN
  Administered 2022-01-23: .2 mg via INTRAVENOUS

## 2022-01-23 MED ORDER — FENTANYL CITRATE (PF) 100 MCG/2ML IJ SOLN
INTRAMUSCULAR | Status: DC | PRN
  Administered 2022-01-23: 50 ug via INTRAVENOUS

## 2022-01-23 MED ORDER — SUCCINYLCHOLINE CHLORIDE 200 MG/10ML IV SOSY
PREFILLED_SYRINGE | INTRAVENOUS | Status: DC | PRN
  Administered 2022-01-23: 80 mg via INTRAVENOUS

## 2022-01-23 MED ORDER — FENTANYL CITRATE (PF) 100 MCG/2ML IJ SOLN
INTRAMUSCULAR | Status: AC
  Filled 2022-01-23: qty 2

## 2022-01-23 MED ORDER — CHLORHEXIDINE GLUCONATE 0.12 % MT SOLN
OROMUCOSAL | Status: AC
  Filled 2022-01-23: qty 15

## 2022-01-23 MED ORDER — ACETAMINOPHEN 10 MG/ML IV SOLN
INTRAVENOUS | Status: AC
  Filled 2022-01-23: qty 100

## 2022-01-23 MED ORDER — MOMETASONE FUROATE 0.1 % EX OINT: TOPICAL_OINTMENT | CUTANEOUS | Status: DC | PRN

## 2022-01-23 MED ORDER — ONDANSETRON HCL 4 MG/2ML IJ SOLN
INTRAMUSCULAR | Status: DC | PRN
  Administered 2022-01-23 (×2): 4 mg via INTRAVENOUS

## 2022-01-23 MED ORDER — DEXAMETHASONE SODIUM PHOSPHATE 10 MG/ML IJ SOLN
INTRAMUSCULAR | Status: DC | PRN
  Administered 2022-01-23: 10 mg via INTRAVENOUS

## 2022-01-23 MED ORDER — DULOXETINE HCL 30 MG PO CPEP: 30.0000 mg | ORAL_CAPSULE | ORAL | Status: DC

## 2022-01-23 MED ORDER — ACETAMINOPHEN 10 MG/ML IV SOLN
INTRAVENOUS | Status: DC | PRN
  Administered 2022-01-23: 1000 mg via INTRAVENOUS

## 2022-01-23 MED ORDER — HYDROCODONE-ACETAMINOPHEN 5-325 MG PO TABS: 1.0000 | ORAL_TABLET | Freq: Four times a day (QID) | ORAL | 0 refills | Status: DC | PRN

## 2022-01-23 MED ORDER — EPHEDRINE SULFATE (PRESSORS) 50 MG/ML IJ SOLN
INTRAMUSCULAR | Status: DC | PRN
  Administered 2022-01-23: 5 mg via INTRAVENOUS

## 2022-01-23 MED ORDER — PROPOFOL 10 MG/ML IV BOLUS
INTRAVENOUS | Status: DC | PRN
  Administered 2022-01-23: 200 mg via INTRAVENOUS

## 2022-01-23 MED ORDER — CEFAZOLIN SODIUM-DEXTROSE 2-4 GM/100ML-% IV SOLN
INTRAVENOUS | Status: AC
  Filled 2022-01-23: qty 100

## 2022-01-23 MED ORDER — MIDAZOLAM HCL 2 MG/2ML IJ SOLN
INTRAMUSCULAR | Status: AC
  Filled 2022-01-23: qty 2

## 2022-01-23 MED ORDER — LIDOCAINE HCL (CARDIAC) PF 100 MG/5ML IV SOSY
PREFILLED_SYRINGE | INTRAVENOUS | Status: DC | PRN
  Administered 2022-01-23: 80 mg via INTRAVENOUS

## 2022-01-23 MED ORDER — BUPIVACAINE HCL (PF) 0.5 % IJ SOLN
INTRAMUSCULAR | Status: AC
  Filled 2022-01-23: qty 30

## 2022-01-23 MED ORDER — BUPIVACAINE HCL (PF) 0.5 % IJ SOLN
INTRAMUSCULAR | Status: DC | PRN
  Administered 2022-01-23: 5 mL

## 2022-01-23 MED ORDER — MIDAZOLAM HCL 2 MG/2ML IJ SOLN
INTRAMUSCULAR | Status: DC | PRN
  Administered 2022-01-23: 2 mg via INTRAVENOUS

## 2022-01-23 SURGICAL SUPPLY — 29 items
BNDG ELASTIC 2X5.8 VLCR NS LF (GAUZE/BANDAGES/DRESSINGS) IMPLANT
BNDG ELASTIC 3X5.8 VLCR NS LF (GAUZE/BANDAGES/DRESSINGS) IMPLANT
BNDG ESMARK 4X12 TAN STRL LF (GAUZE/BANDAGES/DRESSINGS) ×2 IMPLANT
CHLORAPREP W/TINT 26 (MISCELLANEOUS) ×3 IMPLANT
CORD BIP STRL DISP 12FT (MISCELLANEOUS) ×2 IMPLANT
CUFF TOURN SGL QUICK 18X4 (TOURNIQUET CUFF) IMPLANT
GAUZE SPONGE 4X4 12PLY STRL (GAUZE/BANDAGES/DRESSINGS) ×2 IMPLANT
GAUZE XEROFORM 1X8 LF (GAUZE/BANDAGES/DRESSINGS) ×2 IMPLANT
GLOVE BIO SURGEON STRL SZ8 (GLOVE) ×4 IMPLANT
GLOVE SURG UNDER LTX SZ8 (GLOVE) ×2 IMPLANT
GOWN STRL REUS W/ TWL LRG LVL3 (GOWN DISPOSABLE) ×1 IMPLANT
GOWN STRL REUS W/ TWL XL LVL3 (GOWN DISPOSABLE) ×1 IMPLANT
GOWN STRL REUS W/TWL LRG LVL3 (GOWN DISPOSABLE) ×1
GOWN STRL REUS W/TWL XL LVL3 (GOWN DISPOSABLE) ×1
KIT TURNOVER KIT A (KITS) ×2 IMPLANT
MANIFOLD NEPTUNE II (INSTRUMENTS) ×2 IMPLANT
NS IRRIG 500ML POUR BTL (IV SOLUTION) ×2 IMPLANT
PACK EXTREMITY ARMC (MISCELLANEOUS) ×2 IMPLANT
SOL PREP PVP 2OZ (MISCELLANEOUS)
SOLUTION PREP PVP 2OZ (MISCELLANEOUS) ×1 IMPLANT
STOCKINETTE IMPERVIOUS 9X36 MD (GAUZE/BANDAGES/DRESSINGS) ×2 IMPLANT
STRAP SAFETY 5IN WIDE (MISCELLANEOUS) ×1 IMPLANT
STRIP CLOSURE SKIN 1/4X4 (GAUZE/BANDAGES/DRESSINGS) ×1 IMPLANT
SUT PROLENE 4 0 PS 2 18 (SUTURE) ×1 IMPLANT
SUT VIC AB 3-0 SH 27 (SUTURE)
SUT VIC AB 3-0 SH 27X BRD (SUTURE) IMPLANT
SUT VIC AB 4-0 SH 27 (SUTURE) ×1
SUT VIC AB 4-0 SH 27XANBCTRL (SUTURE) IMPLANT
WATER STERILE IRR 500ML POUR (IV SOLUTION) ×2 IMPLANT

## 2022-01-23 NOTE — Anesthesia Procedure Notes (Signed)
Procedure Name: Intubation Date/Time: 01/23/2022 12:50 PM Performed by: Kelton Pillar, CRNA Pre-anesthesia Checklist: Patient identified, Emergency Drugs available, Suction available and Patient being monitored Patient Re-evaluated:Patient Re-evaluated prior to induction Oxygen Delivery Method: Circle system utilized Preoxygenation: Pre-oxygenation with 100% oxygen Induction Type: IV induction Ventilation: Mask ventilation without difficulty Laryngoscope Size: McGraph and 3 Grade View: Grade I Tube type: Oral Tube size: 6.5 mm Number of attempts: 1 Airway Equipment and Method: Stylet and Oral airway Placement Confirmation: ETT inserted through vocal cords under direct vision, positive ETCO2, breath sounds checked- equal and bilateral and CO2 detector Secured at: 21 cm Tube secured with: Tape Dental Injury: Teeth and Oropharynx as per pre-operative assessment

## 2022-01-23 NOTE — Anesthesia Postprocedure Evaluation (Signed)
Anesthesia Post Note  Patient: Amber Livingston  Procedure(s) Performed: EXCISION OF SOFT TISSUE MASS FROM DORSAL INDEX/LONG WEBSPACE OF LEFT HAND (Left: Hand)  Patient location during evaluation: PACU Anesthesia Type: General Level of consciousness: awake and alert, oriented and patient cooperative Pain management: pain level controlled Vital Signs Assessment: post-procedure vital signs reviewed and stable Respiratory status: spontaneous breathing, nonlabored ventilation and respiratory function stable Cardiovascular status: blood pressure returned to baseline and stable Postop Assessment: adequate PO intake Anesthetic complications: no   No notable events documented.   Last Vitals:  Vitals:   01/23/22 1345 01/23/22 1408  BP: 114/73 117/83  Pulse: 74 76  Resp: 13 16  Temp: 36.8 C 36.6 C  SpO2: 94% 98%    Last Pain:  Vitals:   01/23/22 1408  TempSrc: Temporal  PainSc: 0-No pain                 Darrin Nipper

## 2022-01-23 NOTE — Op Note (Signed)
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  Post-Op Diagnosis:   Same  Procedure:   Excision of benign soft tissue mass, left hand  Surgeon:   Pascal Lux, MD  Assistant:   None  Anesthesia:   GET  Findings:   As above.  Complications:   None  Fluids:   200 cc crystalloid  EBL:   0 cc  UOP:   None  TT:   14 minutes at 250 mmHg  Drains:   None  Closure:   4-0 Vicryl subcuticular sutures  Brief Clinical Note:   The patient is a 61 year old female with a history of a benign appearing soft tissue mass in the dorsal webspace between the index and long MCP joints.  The patient notes that the masses been enlarging slowly and is intermittently painful, especially if she bumps it.  She presents at this time for excision of the soft tissue mass of her left hand.  Procedure:   The patient was brought into the operating room and lain in the supine position.  After adequate general endotracheal intubation and anesthesia were obtained, the patient's left hand and upper extremity were prepped with ChloraPrep solution before being draped sterilely.  Preoperative antibiotics were administered.  A timeout was performed to verify the appropriate surgical site before the limb was exsanguinated with an Esmarch and the tourniquet inflated to 250 mmHg.  An approximately 1.5 cm incision was made over the mass in the dorsal webspace between the index and long fingers.  The incision was carried down through the subcutaneous tissues to expose the mass.  This was dissected free circumferentially using iris scissors and removed in its entirety.  It measured approximately 2 x 1 x 0.8 cm and appeared to be a solid benign soft tissue mass.  This was sent to pathology for definitive identification.  The wound was copiously irrigated with sterile saline solution before being closed using 4-0 Vicryl subcuticular sutures.  Benzoin and  Steri-Strips were applied to the skin before sterile bulky dressing was applied to the hand.  The patient was then awakened, extubated, and returned to the recovery room in satisfactory condition after tolerating the procedure well.

## 2022-01-23 NOTE — H&P (Signed)
History of Present Illness:  Amber Livingston is a 61 y.o. female who presents for follow-up of her bilateral hand and wrist pain and weakness secondary to presumed carpal tunnel syndrome. She also notes small masses on the volar aspects of both wrists, consistent with a volar carpal ganglion cyst which tend to increase and decrease in size. Overall, the patient feels that she is doing reasonably well today. She rates her pain at 1/10. She is not taking any medications for discomfort at this time. She denies any reinjury to either wrist or hand, and denies any numbness or paresthesias to her fingers at this time. However, she will get an occasional "itching" to the left hand "every now and then". The patient did receive a carpal tunnel injection into the right wrist at her last visit which she states has been quite beneficial. She would like to undergo a similar injection into her left wrist. Since her last visit, she has undergone EMGs of both upper extremities and presents today to review these results. She also notes the persistence of a firm soft tissue mass in the webspace between the index and long fingers of her left hand that is uncomfortable, especially when bumped. She would like to have this removed.  Current Outpatient Medications:  albuterol 90 mcg/actuation inhaler INHALE 2 PUFFS BY MOUTH EVERY 6 HOURS AS NEEDED FOR WHEEZING OR SHORT OF BREATH   ARIPiprazole (ABILIFY) 10 MG tablet Take 5 mg at night for two weeks then increase to 10 mg at night and continue that dose 30 tablet 1   bee pollen 580 mg Cap Take 1 capsule by mouth once daily   cyanocobalamin, vitamin B-12, 2,500 mcg Tab Take by mouth once daily   diphenhydrAMINE-acetaminophen (TYLENOL PM) 25-500 mg per tablet Take by mouth   DULoxetine (CYMBALTA) 30 MG DR capsule Take 30 mg by mouth once daily   estradioL (ESTRACE) 0.5 MG tablet Take 0.5 mg by mouth once daily   medroxyPROGESTERone (PROVERA) 2.5 MG tablet Take 2.5 mg by mouth once  daily   melatonin 1 mg tablet Take 2 mg by mouth at bedtime   meloxicam (MOBIC) 15 MG tablet Take 1 tablet (15 mg total) by mouth once daily 30 tablet 2   mometasone (ELOCON) 0.1 % ointment Apply topically   nicotine polacrilex (NICORETTE) 4 MG lozenge Take by mouth   nortriptyline (PAMELOR) 10 MG capsule Start Nortriptyline (Pamelor) 10 mg nightly for one week, then increase to 20 mg nightly 60 capsule 3   pantoprazole (PROTONIX) 40 MG DR tablet Take 40 mg by mouth once daily as needed   ribose, bulk, 100 % Powd Use   STIOLTO RESPIMAT 2.5-2.5 mcg/actuation inhaler   traZODone (DESYREL) 50 MG tablet Take by mouth (Patient not taking: Reported on 12/12/2021)   TURMERIC ORAL Take by mouth   Allergies: No Known Allergies  Past Medical History:   Aortic atherosclerosis (CMS-HCC) 06/01/2021   Centrilobular emphysema (CMS-HCC) 12/30/2020   Chronic bronchitis (CMS-HCC) 05/21/2021   History of depression 12/21/2020   Incidental lung nodule, > 82m and < 856m05/14/2022   Palpitations 08/25/2020   SVT (supraventricular tachycardia) (CMS-HCC) 11/07/2020   Past Surgical History:   APPENDECTOMY 2000   Past Family History: No family history on file.   Social History:   Socioeconomic History:   Marital status: Single  Tobacco Use   Smoking status: Former  Types: Cigarettes   Smokeless tobacco: Never  Substance and Sexual Activity   Alcohol use: Not Currently  Drug use: Yes  Frequency: 3.0 times per week  Types: Marijuana, Cocaine  Comment: edibles   Review of Systems:  A comprehensive 14 point ROS was performed, reviewed, and the pertinent orthopaedic findings are documented in the HPI.  Physical Exam: Vitals:  01/09/22 0949  BP: (!) 140/86  Weight: 57.2 kg (126 lb)  Height: 172.7 cm ('5\' 8"'$ )  PainSc: 1  PainLoc: Wrist   General/Constitutional: The patient appears to be well-nourished, well-developed, and in no acute distress. Neuro/Psych: Normal mood and affect, oriented to  person, place and time. Eyes: Non-icteric. Pupils are equal, round, and reactive to light, and exhibit synchronous movement. ENT: Unremarkable. Lymphatic: No palpable adenopathy. Respiratory: Lungs clear to auscultation, Normal chest excursion, No wheezes and Non-labored breathing Cardiovascular: Regular rate and rhythm. No murmurs. and No edema, swelling or tenderness, except as noted in detailed exam. Integumentary: No impressive skin lesions present, except as noted in detailed exam. Musculoskeletal: Unremarkable, except as noted in detailed exam.  Left wrist/hand exam: Skin inspection of the left wrist again is notable for a small purplish apparently fluid-filled soft tissue mass on the volar radial aspect of her left wrist. The mass is at most minimally tender to palpation. There again is a second subcutaneous mobile soft tissue mass measuring approximately 1.2 x 1.8 cm located in the subcutaneous tissues between the index and long MCP joints. This mass is nontender to palpation and moves freely and without any adhesions to the surrounding subcutaneous tissues. There is no overlying erythema, ecchymosis, abrasions, or other skin changes over this mass, nor are there any other areas of erythema, ecchymosis, abrasions, or other skin abnormalities noted around the wrist or hand. She exhibits full active and passive range of motion of the wrist without pain or catching. She is able to actively flex and extend all digits fully without any pain or triggering. She remains neurovascularly intact to all digits of her left hand.   EMG results: The EMG results of both upper extremities is available for review and has been reviewed by myself. By report, the study demonstrates a "Normal study. There is no electrodiagnostic evidence of a large fiber neuropathy in the upper extremities." This report was reviewed by myself and discussed with the patient.  Assessment:  Fibromyalgia.   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    Plan: The treatment options were discussed with the patient. In addition, patient educational materials were provided regarding the diagnosis and treatment options. The patient is frustrated by the persistence of the soft tissue mass noted in the dorsal webspace between the index and long fingers. Therefore, I have recommended a surgical procedure, specifically an excision of the benign appearing soft tissue mass in her left hand. The procedure was discussed with the patient, as were the potential risks (including bleeding, infection, nerve and/or blood vessel injury, persistent or recurrent pain, recurrence of the mass, weakness of grip, stiffness of the fingers, need for further surgery, blood clots, strokes, heart attacks and/or arhythmias, pneumonia, etc.) and benefits. The patient states her understanding and wishes to proceed. All of the patient's questions and concerns were answered. She can call any time with further concerns. She will follow up post-surgery, routine. This office visit took 45 minutes, of which >50% involved patient counseling/education.   H&P reviewed and patient re-examined. No changes.

## 2022-01-23 NOTE — Discharge Instructions (Addendum)
Orthopedic discharge instructions: Keep dressing dry and intact. Keep hand elevated above heart level. May shower after dressing removed on postop day 4 (Sunday). Cover sutures with Band-Aids after drying off. Apply ice to affected area frequently. Take Meloxicam 15 mg daily OR ibuprofen 600-800 mg TID with meals for 5-7 days, then as necessary. Take ES Tylenol or pain medication as prescribed when needed.  Return for follow-up in 10-14 days or as scheduled.  AMBULATORY SURGERY  DISCHARGE INSTRUCTIONS   The drugs that you were given will stay in your system until tomorrow so for the next 24 hours you should not:  Drive an automobile Make any legal decisions Drink any alcoholic beverage   You may resume regular meals tomorrow.  Today it is better to start with liquids and gradually work up to solid foods.  You may eat anything you prefer, but it is better to start with liquids, then soup and crackers, and gradually work up to solid foods.   Please notify your doctor immediately if you have any unusual bleeding, trouble breathing, redness and pain at the surgery site, drainage, fever, or pain not relieved by medication.    Additional Instructions:        Please contact your physician with any problems or Same Day Surgery at [phone], Monday through Friday 6 am to 4 pm, or [place] [facility] number at [phone].

## 2022-01-23 NOTE — Anesthesia Preprocedure Evaluation (Addendum)
Anesthesia Evaluation  Patient identified by MRN, date of birth, ID band Patient awake    Reviewed: Allergy & Precautions, NPO status , Patient's Chart, lab work & pertinent test results  History of Anesthesia Complications Negative for: history of anesthetic complications  Airway Mallampati: II   Neck ROM: Full    Dental  (+) Edentulous Upper, Edentulous Lower   Pulmonary COPD, former smoker (quit 09/2020),    Pulmonary exam normal breath sounds clear to auscultation       Cardiovascular Exercise Tolerance: Good Normal cardiovascular exam Rhythm:Regular Rate:Normal  ECG 12/18/21: NSR   Neuro/Psych PSYCHIATRIC DISORDERS Anxiety Depression negative neurological ROS     GI/Hepatic GERD  ,  Endo/Other  negative endocrine ROS  Renal/GU negative Renal ROS     Musculoskeletal  (+) Arthritis , Fibromyalgia -  Abdominal   Peds  Hematology negative hematology ROS (+)   Anesthesia Other Findings   Reproductive/Obstetrics                            Anesthesia Physical Anesthesia Plan  ASA: 2  Anesthesia Plan: General   Post-op Pain Management:    Induction: Intravenous  PONV Risk Score and Plan: 3 and Ondansetron, Dexamethasone and Treatment may vary due to age or medical condition  Airway Management Planned: Oral ETT  Additional Equipment:   Intra-op Plan:   Post-operative Plan: Extubation in OR  Informed Consent: I have reviewed the patients History and Physical, chart, labs and discussed the procedure including the risks, benefits and alternatives for the proposed anesthesia with the patient or authorized representative who has indicated his/her understanding and acceptance.     Dental advisory given  Plan Discussed with: CRNA  Anesthesia Plan Comments: (Patient consented for risks of anesthesia including but not limited to:  - adverse reactions to medications - damage to eyes,  teeth, lips or other oral mucosa - nerve damage due to positioning  - sore throat or hoarseness - damage to heart, brain, nerves, lungs, other parts of body or loss of life  Informed patient about role of CRNA in peri- and intra-operative care.  Patient voiced understanding.)        Anesthesia Quick Evaluation

## 2022-01-23 NOTE — Transfer of Care (Signed)
Immediate Anesthesia Transfer of Care Note  Patient: Amber Livingston  Procedure(s) Performed: EXCISION OF SOFT TISSUE MASS FROM DORSAL INDEX/LONG WEBSPACE OF LEFT HAND (Left: Hand)  Patient Location: PACU  Anesthesia Type:General  Level of Consciousness: awake, drowsy and patient cooperative  Airway & Oxygen Therapy: Patient Spontanous Breathing and Patient connected to face mask oxygen  Post-op Assessment: Report given to RN and Post -op Vital signs reviewed and stable  Post vital signs: Reviewed and stable  Last Vitals:  Vitals Value Taken Time  BP 113/76 01/23/22 1330  Temp 36.9 C 01/23/22 1326  Pulse 72 01/23/22 1332  Resp 19 01/23/22 1332  SpO2 100 % 01/23/22 1332  Vitals shown include unvalidated device data.  Last Pain:  Vitals:   01/23/22 1326  TempSrc:   PainSc: Asleep      Patients Stated Pain Goal: 0 (77/37/36 6815)  Complications: No notable events documented.

## 2022-01-24 ENCOUNTER — Encounter: Payer: Self-pay | Admitting: Surgery

## 2022-01-24 LAB — SURGICAL PATHOLOGY

## 2022-01-29 ENCOUNTER — Encounter: Payer: Self-pay | Admitting: *Deleted

## 2022-01-29 ENCOUNTER — Other Ambulatory Visit: Payer: Self-pay | Admitting: *Deleted

## 2022-01-31 ENCOUNTER — Ambulatory Visit: Payer: Medicaid Other | Admitting: Psychiatry

## 2022-01-31 ENCOUNTER — Encounter: Payer: Self-pay | Admitting: Psychiatry

## 2022-01-31 VITALS — BP 138/85 | HR 73 | Ht 68.0 in | Wt 122.0 lb

## 2022-01-31 DIAGNOSIS — R413 Other amnesia: Secondary | ICD-10-CM | POA: Diagnosis not present

## 2022-01-31 DIAGNOSIS — M797 Fibromyalgia: Secondary | ICD-10-CM | POA: Diagnosis not present

## 2022-01-31 MED ORDER — DULOXETINE HCL 60 MG PO CPEP: 60.0000 mg | ORAL_CAPSULE | Freq: Every day | ORAL | 6 refills | Status: DC
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
## 2022-01-31 NOTE — Patient Instructions (Addendum)
MRI brain Referral to [facility] to discuss genetic testing for dementia Increase Cymbalta to 60 mg daily  Tasks to improve attention/working memory 1. Good sleep hygiene (7-8 hrs of sleep) 2. Learning a new skill (Painting, Carpentry, Pottery, new language, Knitting). 3.Cognitive exercises (keep a daily journal, Puzzles) 4. Physical exercise and training  (30 min/day X 4 days week) 5. Being on Antidepressant if needed 6.Yoga, Meditation, Tai Chi 7. Decrease alcohol intake 8.Have a clear schedule and structure in daily routine

## 2022-02-01 ENCOUNTER — Telehealth: Payer: Self-pay | Admitting: Psychiatry

## 2022-02-01 LAB — VITAMIN B12: Vitamin B-12: 766 pg/mL (ref 232–1245)

## 2022-02-01 NOTE — Telephone Encounter (Signed)
medicaid healthy blue Josem Kaufmann: [id number] exp. 02/01/22-03/02/22 sent to GI

## 2022-02-01 NOTE — Telephone Encounter (Signed)
Auth expires 04/01/22

## 2022-02-04 ENCOUNTER — Telehealth: Payer: Self-pay | Admitting: Psychiatry

## 2022-02-04 NOTE — Telephone Encounter (Signed)
Referral for Neurology sent to [facility] [phone]. 

## 2022-02-08 ENCOUNTER — Ambulatory Visit
Admission: RE | Admit: 2022-02-08 | Discharge: 2022-02-08 | Disposition: A | Discharge status: Home / Self Care | Payer: Medicaid Other | Source: Ambulatory Visit | Attending: Psychiatry | Admitting: Psychiatry

## 2022-02-08 DIAGNOSIS — R413 Other amnesia: Secondary | ICD-10-CM

## 2022-02-19 DIAGNOSIS — B370 Candidal stomatitis: Secondary | ICD-10-CM | POA: Insufficient documentation

## 2022-02-20 ENCOUNTER — Other Ambulatory Visit: Payer: Self-pay

## 2022-02-20 ENCOUNTER — Other Ambulatory Visit: Payer: Self-pay | Admitting: Gerontology

## 2022-02-20 DIAGNOSIS — F341 Dysthymic disorder: Secondary | ICD-10-CM

## 2022-02-26 ENCOUNTER — Other Ambulatory Visit: Payer: Self-pay | Admitting: Psychiatry

## 2022-02-28 ENCOUNTER — Ambulatory Visit: Payer: Medicaid Other | Admitting: Family Medicine

## 2022-03-01 ENCOUNTER — Telehealth: Payer: Self-pay

## 2022-03-01 NOTE — Telephone Encounter (Signed)
I spoke to pt concerning her upcoming appt with [facility]. I asked about her medicines and Dr Rosario Jacks being her PCP. She stated "that didn't work out and I don't want to go to [place] anymore." I explained she would need to get Dr. Ronnald Ramp' name on her card and she stated "just forget it." She hung up on me. I cancelled the appt

## 2022-03-06 ENCOUNTER — Other Ambulatory Visit: Payer: Self-pay

## 2022-03-06 ENCOUNTER — Encounter: Payer: Self-pay | Admitting: Obstetrics and Gynecology

## 2022-03-06 ENCOUNTER — Ambulatory Visit (INDEPENDENT_AMBULATORY_CARE_PROVIDER_SITE_OTHER): Payer: Medicaid Other | Admitting: Obstetrics and Gynecology

## 2022-03-06 VITALS — BP 107/67 | HR 74 | Ht 68.0 in | Wt 117.3 lb

## 2022-03-06 DIAGNOSIS — Z01419 Encounter for gynecological examination (general) (routine) without abnormal findings: Secondary | ICD-10-CM | POA: Diagnosis not present

## 2022-03-06 DIAGNOSIS — Z1231 Encounter for screening mammogram for malignant neoplasm of breast: Secondary | ICD-10-CM | POA: Diagnosis not present

## 2022-03-06 DIAGNOSIS — Z79890 Hormone replacement therapy (postmenopausal): Secondary | ICD-10-CM

## 2022-03-06 MED ORDER — MEDROXYPROGESTERONE ACETATE 2.5 MG PO TABS: ORAL_TABLET | ORAL | 30 refills | Status: DC

## 2022-03-06 MED ORDER — ESTRADIOL 0.5 MG PO TABS: ORAL_TABLET | ORAL | 30 refills | Status: DC

## 2022-03-06 NOTE — Progress Notes (Signed)
Patients presents for annual exam today. She states having hot flashes recently, still taking daily HRT. Patient is up to date on pap smear. Due for mammogram, ordered. Annual labs are ordered. Patient states no other questions or concerns at this time.
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
## 2022-03-07 ENCOUNTER — Ambulatory Visit: Payer: Medicaid Other | Admitting: Family Medicine

## 2022-03-11 ENCOUNTER — Other Ambulatory Visit: Payer: Self-pay

## 2022-03-11 ENCOUNTER — Other Ambulatory Visit: Payer: Medicaid Other

## 2022-03-11 ENCOUNTER — Other Ambulatory Visit: Payer: Self-pay | Admitting: Obstetrics and Gynecology

## 2022-03-11 DIAGNOSIS — Z79890 Hormone replacement therapy (postmenopausal): Secondary | ICD-10-CM

## 2022-03-11 DIAGNOSIS — Z01419 Encounter for gynecological examination (general) (routine) without abnormal findings: Secondary | ICD-10-CM

## 2022-03-12 LAB — HEMOGLOBIN A1C
Est. average glucose Bld gHb Est-mCnc: 117 mg/dL
Hgb A1c MFr Bld: 5.7 % — ABNORMAL HIGH (ref 4.8–5.6)

## 2022-03-12 LAB — LIPID PANEL
Chol/HDL Ratio: 1.7 ratio (ref 0.0–4.4)
Cholesterol, Total: 182 mg/dL (ref 100–199)
HDL: 106 mg/dL (ref >39)
LDL Chol Calc (NIH): 61 mg/dL (ref 0–99)
Triglycerides: 82 mg/dL (ref 0–149)
VLDL Cholesterol Cal: 15 mg/dL (ref 5–40)

## 2022-03-12 LAB — BASIC METABOLIC PANEL
BUN/Creatinine Ratio: 8 — ABNORMAL LOW (ref 12–28)
BUN: 6 mg/dL — ABNORMAL LOW (ref 8–27)
CO2: 24 mmol/L (ref 20–29)
Calcium: 9.8 mg/dL (ref 8.7–10.3)
Chloride: 92 mmol/L — ABNORMAL LOW (ref 96–106)
Creatinine, Ser: 0.74 mg/dL (ref 0.57–1.00)
Glucose: 98 mg/dL (ref 70–99)
Potassium: 4.5 mmol/L (ref 3.5–5.2)
Sodium: 133 mmol/L — ABNORMAL LOW (ref 134–144)
eGFR: 92 mL/min/{1.73_m2} (ref >59)

## 2022-03-12 LAB — CBC
Hematocrit: 41.3 % (ref 34.0–46.6)
Hemoglobin: 14.4 g/dL (ref 11.1–15.9)
MCH: 32.8 pg (ref 26.6–33.0)
MCHC: 34.9 g/dL (ref 31.5–35.7)
MCV: 94 fL (ref 79–97)
Platelets: 376 10*3/uL (ref 150–450)
RBC: 4.39 x10E6/uL (ref 3.77–5.28)
RDW: 12.1 % (ref 11.7–15.4)
WBC: 3.4 10*3/uL (ref 3.4–10.8)

## 2022-03-12 LAB — TSH: TSH: 1.69 u[IU]/mL (ref 0.450–4.500)
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
## 2022-05-02 ENCOUNTER — Encounter: Payer: Self-pay | Admitting: Nurse Practitioner

## 2022-05-02 ENCOUNTER — Ambulatory Visit: Payer: Medicaid Other | Admitting: Nurse Practitioner

## 2022-05-02 VITALS — BP 104/70 | HR 62 | Temp 98.2°F | Ht 66.73 in | Wt 114.5 lb

## 2022-05-02 DIAGNOSIS — J432 Centrilobular emphysema: Secondary | ICD-10-CM

## 2022-05-02 DIAGNOSIS — R7303 Prediabetes: Secondary | ICD-10-CM | POA: Insufficient documentation

## 2022-05-02 DIAGNOSIS — F419 Anxiety disorder, unspecified: Secondary | ICD-10-CM

## 2022-05-02 DIAGNOSIS — I700 Atherosclerosis of aorta: Secondary | ICD-10-CM | POA: Diagnosis not present

## 2022-05-02 DIAGNOSIS — G894 Chronic pain syndrome: Secondary | ICD-10-CM

## 2022-05-02 DIAGNOSIS — I471 Supraventricular tachycardia: Secondary | ICD-10-CM | POA: Diagnosis not present

## 2022-05-02 DIAGNOSIS — Z7689 Persons encountering health services in other specified circumstances: Secondary | ICD-10-CM

## 2022-05-02 DIAGNOSIS — E782 Mixed hyperlipidemia: Secondary | ICD-10-CM

## 2022-05-02 MED ORDER — DULOXETINE HCL 30 MG PO CPEP: 30.0000 mg | ORAL_CAPSULE | Freq: Every day | ORAL | 1 refills | Status: DC

## 2022-05-02 NOTE — Assessment & Plan Note (Signed)
Chronic.  Controlled.  Continue with current medication regimen of Cymbalta.  Return to clinic in 1 months for reevaluation.  Call sooner if concerns arise.

## 2022-05-02 NOTE — Assessment & Plan Note (Signed)
Chronic. On Stioloto and [employer] PRN.  Followed by Pulmonology. Continue with CT lung. Continue per Pulmonology's recommendations.

## 2022-05-02 NOTE — Assessment & Plan Note (Signed)
Chronic. Does not tolerate statins.  On repatha per cardiology.  Continue with medication regimen.

## 2022-05-02 NOTE — Assessment & Plan Note (Signed)
Chronic.  Controlled.  Continue with current medication regimen of Repatha.  Return to clinic in 1 months for reevaluation.  Call sooner if concerns arise.

## 2022-05-02 NOTE — Assessment & Plan Note (Signed)
Chronic. Does of Cymbalta was increased due to being out of the '30mg'$ . Patient noticed adverse effects and would like to go back to '30mg'$  of Cymbalta.  Will decrease dose back to '30mg'$ .  Can still use '60mg'$  PRN for pain flares per Neurologist.  Follow up in 1 month for reevaluation.  Call Sooner if concerns arise.

## 2022-05-02 NOTE — Assessment & Plan Note (Signed)
Recently had labs drawn and A1c was 5.7.  Will monitor labs in the future.

## 2022-05-02 NOTE — Assessment & Plan Note (Signed)
Chronic. Well controlled.  Followed by Cardiology every 6 months. Continue to follow their recommendations.

## 2022-05-06 ENCOUNTER — Other Ambulatory Visit: Payer: Self-pay | Admitting: Surgery

## 2022-05-08 ENCOUNTER — Ambulatory Visit
Admission: RE | Admit: 2022-05-08 | Discharge: 2022-05-08 | Disposition: A | Discharge status: Home / Self Care | Payer: Medicaid Other | Source: Ambulatory Visit | Attending: Obstetrics and Gynecology | Admitting: Obstetrics and Gynecology

## 2022-05-08 DIAGNOSIS — Z01419 Encounter for gynecological examination (general) (routine) without abnormal findings: Secondary | ICD-10-CM | POA: Diagnosis present

## 2022-05-08 DIAGNOSIS — Z1231 Encounter for screening mammogram for malignant neoplasm of breast: Secondary | ICD-10-CM

## 2022-05-13 ENCOUNTER — Ambulatory Visit
Admission: RE | Admit: 2022-05-13 | Discharge: 2022-05-13 | Disposition: A | Discharge status: Home / Self Care | Payer: Medicaid Other | Source: Ambulatory Visit | Attending: Acute Care | Admitting: Acute Care

## 2022-05-13 DIAGNOSIS — J439 Emphysema, unspecified: Secondary | ICD-10-CM | POA: Insufficient documentation

## 2022-05-13 DIAGNOSIS — Z122 Encounter for screening for malignant neoplasm of respiratory organs: Secondary | ICD-10-CM | POA: Insufficient documentation

## 2022-05-13 DIAGNOSIS — I700 Atherosclerosis of aorta: Secondary | ICD-10-CM | POA: Insufficient documentation

## 2022-05-13 DIAGNOSIS — Z87891 Personal history of nicotine dependence: Secondary | ICD-10-CM | POA: Diagnosis not present

## 2022-05-14 ENCOUNTER — Ambulatory Visit (INDEPENDENT_AMBULATORY_CARE_PROVIDER_SITE_OTHER): Payer: Medicaid Other | Admitting: Pulmonary Disease

## 2022-05-14 ENCOUNTER — Encounter: Payer: Self-pay | Admitting: Pulmonary Disease

## 2022-05-14 VITALS — BP 126/78 | HR 68 | Ht 68.0 in | Wt 117.4 lb

## 2022-05-14 DIAGNOSIS — J432 Centrilobular emphysema: Secondary | ICD-10-CM | POA: Diagnosis not present

## 2022-05-14 DIAGNOSIS — Z23 Encounter for immunization: Secondary | ICD-10-CM

## 2022-05-14 DIAGNOSIS — R0981 Nasal congestion: Secondary | ICD-10-CM | POA: Diagnosis not present

## 2022-05-14 DIAGNOSIS — R0602 Shortness of breath: Secondary | ICD-10-CM

## 2022-05-14 MED ORDER — FLUTICASONE PROPIONATE HFA 110 MCG/ACT IN AERO: 2.0000 | INHALATION_SPRAY | Freq: Two times a day (BID) | RESPIRATORY_TRACT | 5 refills | Status: DC

## 2022-05-14 MED ORDER — STIOLTO RESPIMAT 2.5-2.5 MCG/ACT IN AERS: 2.0000 | INHALATION_SPRAY | Freq: Every day | RESPIRATORY_TRACT | 5 refills | Status: DC
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
## 2022-05-14 NOTE — Patient Instructions (Addendum)
  Shortness of breath - improved on Stiolto Can be multi factorial in setting of emphysema, fibromyalgia, deconditioning.  Management as noted below  Emphysema - persistent symptoms --START Flovent 110 mcg TWO puffs in the morning and evening. Rinse mouth out after use --CONTINUE Stiolto 2.5/2.5 mcg TWO puffs ONCE a day. --CONTINUE Albuterol as needed for shortness of breath or wheezing --Encourage regular aerobic activity five days a week for at least 20 min --Discussed vaccinations: Administer influenza  Lung nodules, stable --Enrolled in lung screening  Nasal congestion --Encourage OTC allergy medication (zyrtec or claritin as directed) and nasal spray for symptoms  Follow-up with me in 6 months

## 2022-05-15 ENCOUNTER — Other Ambulatory Visit: Payer: Self-pay | Admitting: Acute Care

## 2022-05-15 DIAGNOSIS — Z87891 Personal history of nicotine dependence: Secondary | ICD-10-CM

## 2022-05-15 DIAGNOSIS — Z122 Encounter for screening for malignant neoplasm of respiratory organs: Secondary | ICD-10-CM

## 2022-05-16 ENCOUNTER — Encounter
Admission: RE | Admit: 2022-05-16 | Discharge: 2022-05-16 | Disposition: A | Discharge status: Home / Self Care | Payer: Medicaid Other | Source: Ambulatory Visit | Attending: Surgery | Admitting: Surgery

## 2022-05-16 NOTE — Patient Instructions (Addendum)
Your procedure is scheduled on: Wednesday May 22, 2022. Report to Day Surgery inside [facility] 2nd floor, stop by registration desk before getting on elevator. To find out your arrival time please call [phone] between 1PM - 3PM on Tuesday May 21, 2022.  Remember: Instructions that are not followed completely may result in serious medical risk,  up to and including death, or upon the discretion of your surgeon and anesthesiologist your  surgery may need to be rescheduled.     _X__ 1. Do not eat food after midnight the night before your procedure.                 No chewing gum or hard candies. You may drink clear liquids up to 2 hours                 before you are scheduled to arrive for your surgery- DO not drink clear                 liquids within 2 hours of the start of your surgery.                 Clear Liquids include:  water, apple juice without pulp, clear Gatorade, G2 or                  Gatorade Zero (avoid Red/Purple/Blue), Black Coffee or Tea (Do not add                 anything to coffee or tea).  __X__2.   Complete the "Ensure Clear Pre-surgery Clear Carbohydrate Drink" provided to you, 2 hours before arrival. **If you are diabetic you will be provided with an alternative drink, Gatorade Zero or G2.  __X__3.  On the morning of surgery brush your teeth with toothpaste and water, you                may rinse your mouth with mouthwash if you wish.  Do not swallow any toothpaste or mouthwash.     _X__ 4.  No Alcohol for 24 hours before or after surgery.   _X__ 5.  Do Not Smoke or use e-cigarettes For 24 Hours Prior to Your Surgery.                 Do not use any chewable tobacco products for at least 6 hours prior to                 Surgery.  _X__  6.  Do not use any recreational drugs (marijuana, cocaine, heroin, ecstasy, MDMA or other)                For at least one week prior to your surgery.  Combination of these drugs with  anesthesia                May have life threatening results.  ____  7.  Bring all medications with you on the day of surgery if instructed.   __X__ 8.  Notify your doctor if there is any change in your medical condition      (cold, fever, infections).     Do not wear jewelry, make-up, hairpins, clips or nail polish. Do not wear lotions, powders, or perfumes. You may wear deodorant. Do not shave 48 hours prior to surgery. Men may shave face and neck. Do not bring valuables to the hospital.    [facility] is not responsible for any belongings or valuables.  Contacts,  dentures or bridgework may not be worn into surgery. Leave your suitcase in the car. After surgery it may be brought to your room. For patients admitted to the hospital, discharge time is determined by your treatment team.   Patients discharged the day of surgery will not be allowed to drive home.   Make arrangements for someone to be with you for the first 24 hours of your Same Day Discharge.   __X__ Take these medicines the morning of surgery with A SIP OF WATER:    1. DULoxetine (CYMBALTA) 30 MG  2. pantoprazole (PROTONIX) 40 MG   3. medroxyPROGESTERone (PROVERA) 2.5 MG   4.  5.  6.  ____ Fleet Enema (as directed)   __X__ Use CHG Soap (or wipes) as directed  ____ Use Benzoyl Peroxide Gel as instructed  __X__ Use inhalers on the day of surgery  PROAIR HFA 108 (90 Base) MCG/ACT inhaler  ____ Stop metformin 2 days prior to surgery    ____ Take 1/2 of usual insulin dose the night before surgery. No insulin the morning          of surgery.   ____ Call your PCP, cardiologist, or Pulmonologist if taking Coumadin/Plavix/aspirin and ask when to stop before your surgery.   __X__ One Week prior to surgery- Stop Anti-inflammatories such as Ibuprofen, Aleve, Advil, Motrin, meloxicam (MOBIC), diclofenac, etodolac, ketorolac, Toradol, Daypro, piroxicam, Goody's or BC powders. OK TO USE TYLENOL IF NEEDED   __X__ Stop  ALL supplements now, until after surgery. Turmeric, BEE POLLEN and CBD gummies  ____ Bring C-Pap to the hospital.    If you have any questions regarding your pre-procedure instructions,  Please call Pre-admit Testing at [phone]

## 2022-05-22 ENCOUNTER — Ambulatory Visit: Payer: Medicaid Other | Admitting: Certified Registered"

## 2022-05-22 ENCOUNTER — Ambulatory Visit
Admission: RE | Admit: 2022-05-22 | Discharge: 2022-05-22 | Disposition: A | Discharge status: Home / Self Care | Payer: Medicaid Other | Attending: Surgery | Admitting: Surgery

## 2022-05-22 ENCOUNTER — Other Ambulatory Visit: Payer: Self-pay

## 2022-05-22 ENCOUNTER — Ambulatory Visit: Payer: Medicaid Other | Admitting: Urgent Care

## 2022-05-22 ENCOUNTER — Encounter: Payer: Self-pay | Admitting: Surgery

## 2022-05-22 ENCOUNTER — Encounter
Admission: RE | Disposition: A | Discharge status: Home / Self Care | Payer: Self-pay | Source: Home / Self Care | Attending: Surgery

## 2022-05-22 DIAGNOSIS — M1990 Unspecified osteoarthritis, unspecified site: Secondary | ICD-10-CM | POA: Diagnosis not present

## 2022-05-22 DIAGNOSIS — G5601 Carpal tunnel syndrome, right upper limb: Secondary | ICD-10-CM | POA: Diagnosis present

## 2022-05-22 DIAGNOSIS — M67431 Ganglion, right wrist: Secondary | ICD-10-CM | POA: Diagnosis not present

## 2022-05-22 DIAGNOSIS — J432 Centrilobular emphysema: Secondary | ICD-10-CM | POA: Insufficient documentation

## 2022-05-22 DIAGNOSIS — F32A Depression, unspecified: Secondary | ICD-10-CM | POA: Diagnosis not present

## 2022-05-22 DIAGNOSIS — E785 Hyperlipidemia, unspecified: Secondary | ICD-10-CM | POA: Insufficient documentation

## 2022-05-22 DIAGNOSIS — F419 Anxiety disorder, unspecified: Secondary | ICD-10-CM | POA: Diagnosis not present

## 2022-05-22 DIAGNOSIS — Z87891 Personal history of nicotine dependence: Secondary | ICD-10-CM | POA: Diagnosis not present

## 2022-05-22 DIAGNOSIS — M797 Fibromyalgia: Secondary | ICD-10-CM | POA: Diagnosis not present

## 2022-05-22 DIAGNOSIS — I700 Atherosclerosis of aorta: Secondary | ICD-10-CM | POA: Insufficient documentation

## 2022-05-22 DIAGNOSIS — K219 Gastro-esophageal reflux disease without esophagitis: Secondary | ICD-10-CM | POA: Diagnosis not present

## 2022-05-22 HISTORY — PX: CARPAL TUNNEL RELEASE: SHX101

## 2022-05-22 SURGERY — RELEASE, CARPAL TUNNEL, ENDOSCOPIC
Anesthesia: General | Site: Wrist | Laterality: Right

## 2022-05-22 MED ORDER — EPHEDRINE SULFATE (PRESSORS) 50 MG/ML IJ SOLN
INTRAMUSCULAR | Status: DC | PRN
  Administered 2022-05-22: 10 mg via INTRAVENOUS

## 2022-05-22 MED ORDER — 0.9 % SODIUM CHLORIDE (POUR BTL) OPTIME
TOPICAL | Status: DC | PRN
  Administered 2022-05-22: 500 mL

## 2022-05-22 MED ORDER — ONDANSETRON HCL 4 MG/2ML IJ SOLN
INTRAMUSCULAR | Status: DC | PRN
  Administered 2022-05-22: 4 mg via INTRAVENOUS

## 2022-05-22 MED ORDER — KETOROLAC TROMETHAMINE 30 MG/ML IJ SOLN
INTRAMUSCULAR | Status: AC
  Filled 2022-05-22: qty 1

## 2022-05-22 MED ORDER — CHLORHEXIDINE GLUCONATE 0.12 % MT SOLN
OROMUCOSAL | Status: AC
  Administered 2022-05-22: 15 mL via OROMUCOSAL
  Filled 2022-05-22: qty 15

## 2022-05-22 MED ORDER — MIDAZOLAM HCL 2 MG/2ML IJ SOLN
INTRAMUSCULAR | Status: AC
  Filled 2022-05-22: qty 2

## 2022-05-22 MED ORDER — LACTATED RINGERS IV SOLN: INTRAVENOUS | Status: DC

## 2022-05-22 MED ORDER — CEFAZOLIN SODIUM-DEXTROSE 2-4 GM/100ML-% IV SOLN
2.0000 g | INTRAVENOUS | Status: AC
  Administered 2022-05-22: 2 g via INTRAVENOUS

## 2022-05-22 MED ORDER — OXYCODONE HCL 5 MG/5ML PO SOLN: 5.0000 mg | Freq: Once | ORAL | Status: DC | PRN

## 2022-05-22 MED ORDER — HYDROCODONE-ACETAMINOPHEN 5-325 MG PO TABS: 1.0000 | ORAL_TABLET | Freq: Four times a day (QID) | ORAL | 0 refills | Status: DC | PRN

## 2022-05-22 MED ORDER — BUPIVACAINE HCL (PF) 0.5 % IJ SOLN
INTRAMUSCULAR | Status: AC
  Filled 2022-05-22: qty 30

## 2022-05-22 MED ORDER — FENTANYL CITRATE (PF) 100 MCG/2ML IJ SOLN
INTRAMUSCULAR | Status: AC
  Filled 2022-05-22: qty 2

## 2022-05-22 MED ORDER — BUPIVACAINE HCL (PF) 0.5 % IJ SOLN
INTRAMUSCULAR | Status: DC | PRN
  Administered 2022-05-22: 10 mL

## 2022-05-22 MED ORDER — CHLORHEXIDINE GLUCONATE 0.12 % MT SOLN: 15.0000 mL | Freq: Once | OROMUCOSAL | Status: AC

## 2022-05-22 MED ORDER — MIDAZOLAM HCL 2 MG/2ML IJ SOLN
INTRAMUSCULAR | Status: DC | PRN
  Administered 2022-05-22: 2 mg via INTRAVENOUS

## 2022-05-22 MED ORDER — FENTANYL CITRATE (PF) 100 MCG/2ML IJ SOLN
25.0000 ug | INTRAMUSCULAR | Status: DC | PRN
  Administered 2022-05-22 (×2): 25 ug via INTRAVENOUS

## 2022-05-22 MED ORDER — LIDOCAINE HCL (PF) 2 % IJ SOLN
INTRAMUSCULAR | Status: AC
  Filled 2022-05-22: qty 5

## 2022-05-22 MED ORDER — DEXAMETHASONE SODIUM PHOSPHATE 10 MG/ML IJ SOLN
INTRAMUSCULAR | Status: DC | PRN
  Administered 2022-05-22: 10 mg via INTRAVENOUS

## 2022-05-22 MED ORDER — FENTANYL CITRATE (PF) 100 MCG/2ML IJ SOLN
INTRAMUSCULAR | Status: AC
  Administered 2022-05-22: 25 ug via INTRAVENOUS
  Filled 2022-05-22: qty 2

## 2022-05-22 MED ORDER — EPHEDRINE 5 MG/ML INJ
INTRAVENOUS | Status: AC
  Filled 2022-05-22: qty 5

## 2022-05-22 MED ORDER — ORAL CARE MOUTH RINSE: 15.0000 mL | Freq: Once | OROMUCOSAL | Status: AC

## 2022-05-22 MED ORDER — OXYCODONE HCL 5 MG PO TABS: 5.0000 mg | ORAL_TABLET | Freq: Once | ORAL | Status: DC | PRN

## 2022-05-22 MED ORDER — DEXAMETHASONE SODIUM PHOSPHATE 10 MG/ML IJ SOLN
INTRAMUSCULAR | Status: AC
  Filled 2022-05-22: qty 1

## 2022-05-22 MED ORDER — KETOROLAC TROMETHAMINE 15 MG/ML IJ SOLN: 15.0000 mg | Freq: Once | INTRAMUSCULAR | Status: DC

## 2022-05-22 MED ORDER — KETOROLAC TROMETHAMINE 30 MG/ML IJ SOLN
INTRAMUSCULAR | Status: DC | PRN
  Administered 2022-05-22: 15 mg via INTRAVENOUS

## 2022-05-22 MED ORDER — LIDOCAINE HCL (CARDIAC) PF 100 MG/5ML IV SOSY
PREFILLED_SYRINGE | INTRAVENOUS | Status: DC | PRN
  Administered 2022-05-22: 100 mg via INTRAVENOUS

## 2022-05-22 MED ORDER — ONDANSETRON HCL 4 MG/2ML IJ SOLN
INTRAMUSCULAR | Status: AC
  Filled 2022-05-22: qty 2

## 2022-05-22 MED ORDER — PROPOFOL 10 MG/ML IV BOLUS
INTRAVENOUS | Status: AC
  Filled 2022-05-22: qty 20

## 2022-05-22 MED ORDER — CEFAZOLIN SODIUM-DEXTROSE 2-4 GM/100ML-% IV SOLN
INTRAVENOUS | Status: AC
  Filled 2022-05-22: qty 100

## 2022-05-22 MED ORDER — FENTANYL CITRATE (PF) 100 MCG/2ML IJ SOLN
INTRAMUSCULAR | Status: DC | PRN
  Administered 2022-05-22 (×2): 50 ug via INTRAVENOUS

## 2022-05-22 MED ORDER — PROPOFOL 10 MG/ML IV BOLUS
INTRAVENOUS | Status: DC | PRN
  Administered 2022-05-22: 100 mg via INTRAVENOUS

## 2022-05-22 SURGICAL SUPPLY — 34 items
APL PRP STRL LF DISP 70% ISPRP (MISCELLANEOUS) ×1
BNDG CMPR 5X4 CHSV STRCH STRL (GAUZE/BANDAGES/DRESSINGS) ×1
BNDG COHESIVE 4X5 TAN STRL LF (GAUZE/BANDAGES/DRESSINGS) ×1 IMPLANT
BNDG ELASTIC 2X5.8 VLCR STR LF (GAUZE/BANDAGES/DRESSINGS) ×1 IMPLANT
BNDG ESMARK 4X12 TAN STRL LF (GAUZE/BANDAGES/DRESSINGS) ×1 IMPLANT
CHLORAPREP W/TINT 26 (MISCELLANEOUS) ×1 IMPLANT
CORD BIP STRL DISP 12FT (MISCELLANEOUS) ×1 IMPLANT
CUFF TOURN SGL QUICK 18X4 (TOURNIQUET CUFF) ×1 IMPLANT
DRAPE SURG 17X11 SM STRL (DRAPES) ×1 IMPLANT
FORCEPS JEWEL BIP 4-3/4 STR (INSTRUMENTS) ×1 IMPLANT
GAUZE SPONGE 4X4 12PLY STRL (GAUZE/BANDAGES/DRESSINGS) ×1 IMPLANT
GAUZE XEROFORM 1X8 LF (GAUZE/BANDAGES/DRESSINGS) ×1 IMPLANT
GLOVE BIO SURGEON STRL SZ8 (GLOVE) ×1 IMPLANT
GLOVE SURG UNDER LTX SZ8 (GLOVE) ×1 IMPLANT
GOWN STRL REUS W/ TWL LRG LVL3 (GOWN DISPOSABLE) ×1 IMPLANT
GOWN STRL REUS W/ TWL XL LVL3 (GOWN DISPOSABLE) ×1 IMPLANT
GOWN STRL REUS W/TWL LRG LVL3 (GOWN DISPOSABLE) ×1
GOWN STRL REUS W/TWL XL LVL3 (GOWN DISPOSABLE) ×1
KIT CARPAL TUNNEL (MISCELLANEOUS) ×1
KIT ESCP INSRT D SLOT CANN KN (MISCELLANEOUS) ×1 IMPLANT
KIT TURNOVER KIT A (KITS) ×1 IMPLANT
MANIFOLD NEPTUNE II (INSTRUMENTS) ×1 IMPLANT
NS IRRIG 500ML POUR BTL (IV SOLUTION) ×1 IMPLANT
PACK EXTREMITY ARMC (MISCELLANEOUS) ×1 IMPLANT
SPLINT WRIST LG LT TX990309 (SOFTGOODS) IMPLANT
SPLINT WRIST LG RT TX900304 (SOFTGOODS) IMPLANT
SPLINT WRIST M LT TX990308 (SOFTGOODS) IMPLANT
SPLINT WRIST M RT TX990303 (SOFTGOODS) IMPLANT
SPLINT WRIST XL LT TX990310 (SOFTGOODS) IMPLANT
SPLINT WRIST XL RT TX990305 (SOFTGOODS) IMPLANT
STOCKINETTE IMPERVIOUS 9X36 MD (GAUZE/BANDAGES/DRESSINGS) ×1 IMPLANT
SUT PROLENE 4 0 PS 2 18 (SUTURE) ×1 IMPLANT
TRAP FLUID SMOKE EVACUATOR (MISCELLANEOUS) ×1 IMPLANT
WATER STERILE IRR 500ML POUR (IV SOLUTION) ×1 IMPLANT

## 2022-05-22 NOTE — Anesthesia Postprocedure Evaluation (Signed)
Anesthesia Post Note  Patient: Amber Livingston  Procedure(s) Performed: CARPAL TUNNEL RELEASE ENDOSCOPIC, RIGHT (Right: Wrist)  Patient location during evaluation: PACU Anesthesia Type: General Level of consciousness: awake and alert Pain management: pain level controlled Vital Signs Assessment: post-procedure vital signs reviewed and stable Respiratory status: spontaneous breathing, nonlabored ventilation, respiratory function stable and patient connected to nasal cannula oxygen Cardiovascular status: blood pressure returned to baseline and stable Postop Assessment: no apparent nausea or vomiting Anesthetic complications: no   No notable events documented.   Last Vitals:  Vitals:   05/22/22 0936 05/22/22 0948  BP:  (!) 115/58  Pulse: (!) 58   Resp: 12 15  Temp:  (!) 36.1 C  SpO2: 95% 100%    Last Pain:  Vitals:   05/22/22 0948  TempSrc: Temporal  PainSc: 0-No pain                 Precious Haws Valda Christenson

## 2022-05-22 NOTE — Transfer of Care (Signed)
Immediate Anesthesia Transfer of Care Note  Patient: Amber Livingston  Procedure(s) Performed: CARPAL TUNNEL RELEASE ENDOSCOPIC, RIGHT (Right: Wrist)  Patient Location: PACU  Anesthesia Type:General  Level of Consciousness: drowsy  Airway & Oxygen Therapy: Patient Spontanous Breathing and Patient connected to face mask oxygen  Post-op Assessment: Report given to RN and Post -op Vital signs reviewed and stable  Post vital signs: Reviewed and stable  Last Vitals:  Vitals Value Taken Time  BP 138/71 05/22/22 0909  Temp 35.9 0908  Pulse 67 05/22/22 0910  Resp 23 05/22/22 0910  SpO2 100 % 05/22/22 0910  Vitals shown include unvalidated device data.  Last Pain:  Vitals:   05/22/22 0727  TempSrc: Temporal  PainSc: 5          Complications: No notable events documented.

## 2022-05-22 NOTE — Discharge Instructions (Addendum)
Orthopedic discharge instructions: Keep dressing dry and intact. Keep hand elevated above heart level. May shower after dressing removed on postop day 4 (Sunday). Cover sutures with Band-Aids after drying off, then reapply Velcro splint. Apply ice to affected area frequently. Resume meloxicam 15 mg daily OR take ibuprofen 600 mg TID with meals for 3-5 days, then as necessary. Take ES Tylenol or pain medication as prescribed when needed.  Return for follow-up in 10-14 days or as scheduled.  AMBULATORY SURGERY  DISCHARGE INSTRUCTIONS   The drugs that you were given will stay in your system until tomorrow so for the next 24 hours you should not:  Drive an automobile Make any legal decisions Drink any alcoholic beverage   You may resume regular meals tomorrow.  Today it is better to start with liquids and gradually work up to solid foods.  You may eat anything you prefer, but it is better to start with liquids, then soup and crackers, and gradually work up to solid foods.   Please notify your doctor immediately if you have any unusual bleeding, trouble breathing, redness and pain at the surgery site, drainage, fever, or pain not relieved by medication.    Additional Instructions:        Please contact your physician with any problems or Same Day Surgery at [phone], Monday through Friday 6 am to 4 pm, or  at [facility] number at [phone].
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
## 2022-05-22 NOTE — H&P (Signed)
History of Present Illness:  Amber Livingston is a 61 y.o. female who presents today for a history and physical in preparation for her upcoming right wrist surgery to include an endoscopic carpal tunnel release and removal of a soft tissue mass on the volar aspect of her right wrist. The patient notes little change in her symptoms since her last visit. She continues to complain of numbness and paresthesias to her hand with radiation of the same symptoms extending up her forearm to above her elbow. She also has difficulty grasping and holding objects and frequently will drop objects with her right hand. She has undergone several injections in the past which provided temporary partial relief of her symptoms.  Current Outpatient Medications: albuterol 90 mcg/actuation inhaler INHALE 2 PUFFS BY MOUTH EVERY 6 HOURS AS NEEDED FOR WHEEZING OR SHORT OF BREATH  ARIPiprazole (ABILIFY) 10 MG tablet Take 5 mg at night for two weeks then increase to 10 mg at night and continue that dose 30 tablet 1  bee pollen 580 mg Cap Take 1 capsule by mouth once daily  cyanocobalamin, vitamin B-12, 2,500 mcg Tab Take by mouth once daily  diphenhydrAMINE-acetaminophen (TYLENOL PM) 25-500 mg per tablet Take by mouth  DULoxetine (CYMBALTA) 30 MG DR capsule Take 30 mg by mouth once daily  estradioL (ESTRACE) 0.5 MG tablet Take 0.5 mg by mouth once daily  HYDROcodone-acetaminophen (NORCO) 5-325 mg tablet Take 1 tablet by mouth as directed  medroxyPROGESTERone (PROVERA) 2.5 MG tablet Take 2.5 mg by mouth once daily  melatonin 1 mg tablet Take 2 mg by mouth at bedtime  meloxicam (MOBIC) 15 MG tablet Take 1 tablet (15 mg total) by mouth once daily 30 tablet 2  mometasone (ELOCON) 0.1 % ointment Apply topically  nicotine polacrilex (NICORETTE) 4 MG lozenge Take by mouth  nortriptyline (PAMELOR) 10 MG capsule Start Nortriptyline (Pamelor) 10 mg nightly for one week, then increase to 20 mg nightly 60 capsule 3  pantoprazole (PROTONIX) 40 MG  DR tablet Take 40 mg by mouth once daily as needed  pravastatin (PRAVACHOL) 20 MG tablet pravastatin 20 mg tablet TAKE 1 TABLET BY MOUTH EVERY DAY IN THE EVENING  REPATHA SURECLICK 536 mg/mL PnIj Repatha SureClick 144 mg/mL subcutaneous pen injector Inject 1 mL every 2 weeks by subcutaneous route.  ribose, bulk, 100 % Powd Use  STIOLTO RESPIMAT 2.5-2.5 mcg/actuation inhaler  traZODone (DESYREL) 50 MG tablet Take by mouth (Patient not taking: Reported on 12/12/2021)  TURMERIC ORAL Take by mouth   Allergies: No Known Allergies  Past Medical History:  Aortic atherosclerosis (CMS-HCC) 06/01/2021  Centrilobular emphysema (CMS-HCC) 12/30/2020  Chronic bronchitis (CMS-HCC) 05/21/2021  History of depression 12/21/2020  Incidental lung nodule, > 14m and < 858m05/14/2022  Palpitations 08/25/2020  SVT (supraventricular tachycardia) (CMS-HCC) 11/07/2020   Past Surgical History:  APPENDECTOMY 2000  Excision of benign soft tissue mass, left hand 01/23/2022 (Dr. PoRoland Rack  Past Family History: No family history on file.   Social History:   Socioeconomic History:  Marital status: Single  Tobacco Use  Smoking status: Former  Types: Cigarettes  Smokeless tobacco: Never  Substance and Sexual Activity  Alcohol use: Not Currently  Drug use: Yes  Frequency: 3.0 times per week  Types: Marijuana, Cocaine  Comment: ediables   Review of Systems:  A comprehensive 14 point ROS was performed, reviewed, and the pertinent orthopaedic findings are documented in the HPI.  Physical Exam: Vitals:  05/01/22 1004  BP: (!) 134/92  Weight: 53.3 kg (117 lb  9.6 oz)  Height: 172.7 cm ('5\' 8"'$ )  PainSc: 2  PainLoc: Wrist   General/Constitutional: The patient appears to be well-nourished, well-developed, and in no acute distress. Neuro/Psych: Normal mood and affect, oriented to person, place and time. Eyes: Non-icteric. Pupils are equal, round, and reactive to light, and exhibit synchronous movement. ENT:  Unremarkable. Lymphatic: No palpable adenopathy. Respiratory: Lungs clear to auscultation, Normal chest excursion, No wheezes, and Non-labored breathing Cardiovascular: Regular rate and rhythm. No murmurs. and No edema, swelling or tenderness, except as noted in detailed exam. Integumentary: No impressive skin lesions present, except as noted in detailed exam. Musculoskeletal: Unremarkable, except as noted in detailed exam.  Right wrist/hand exam: On inspection, a small purplish apparently fluid-filled soft tissue mass again is noted on the volar radial aspect of her right wrist. This mass is minimally tender to palpation. There is no overlying erythema, ecchymosis, abrasions, or other skin changes identified around the wrist or hand. She exhibits full active and passive range of motion of the wrist without pain or catching. She is able to actively flex and extend all digits fully without any pain or triggering. She again is neurovascularly intact to all digits of her left hand. She exhibits an equivocally positive Phalen's test and a negative Tinel's over the carpal tunnel.  Assessment: 1. Carpal tunnel syndrome, right.  2. Ganglion cyst of volar aspect of right wrist.   Plan: The treatment options were discussed with the patient. In addition, patient educational materials were provided regarding the diagnosis and treatment options. The patient is frustrated by her continued symptoms and functional limitations, and is ready to proceed with surgical intervention to include an endoscopic right carpal tunnel release with excision of the soft tissue mass on the volar aspect of her right wrist. The procedure was discussed with the patient, as were the potential risks (including bleeding, infection, nerve and/or blood vessel injury, persistent or recurrent pain/paresthesias, recurrence of the soft tissue mass, need for further surgery, blood clots, strokes, heart attacks and/or arhythmias, pneumonia, etc.)  and benefits. The patient states her understanding and wishes to proceed. All of the patient's questions and concerns were answered. She can call any time with further concerns. She will follow up post-surgery, routine.   H&P reviewed and patient re-examined. No changes.

## 2022-05-22 NOTE — Anesthesia Procedure Notes (Signed)
Procedure Name: LMA Insertion Date/Time: 05/22/2022 8:29 AM  Performed by: Cammie Sickle, CRNAPre-anesthesia Checklist: Patient identified, Patient being monitored, Timeout performed, Emergency Drugs available and Suction available Patient Re-evaluated:Patient Re-evaluated prior to induction Oxygen Delivery Method: Circle system utilized Preoxygenation: Pre-oxygenation with 100% oxygen Induction Type: IV induction Ventilation: Mask ventilation without difficulty LMA: LMA inserted LMA Size: 3.0 Tube type: Oral Number of attempts: 1 Placement Confirmation: positive ETCO2 and breath sounds checked- equal and bilateral Tube secured with: Tape Dental Injury: Teeth and Oropharynx as per pre-operative assessment

## 2022-05-22 NOTE — Op Note (Signed)
05/22/2022  9:35 AM  Patient:   Amber Livingston  Pre-Op Diagnosis:   Right carpal tunnel syndrome.  Post-Op Diagnosis:   Same.  Procedure:   Endoscopic right carpal tunnel release.  Surgeon:   Pascal Lux, MD  Anesthesia:   General LMA  Findings:   As above.  Complications:   None  EBL:   0 cc  Fluids:   450 cc crystalloid  TT:   14 minutes at 250 mmHg  Drains:   None  Closure:   4-0 Prolene interrupted sutures  Brief Clinical Note:   The patient is a 61 year old female with a history of pain and paresthesias to her right hand.  Her symptoms have persisted despite medications, activity modification, injections, and splinting.  Her history and examination are consistent with carpal tunnel syndrome.  The patient presents at this time for an endoscopic right carpal tunnel release.   Procedure:   The patient was brought into the operating room and lain in the supine position. After adequate general laryngeal mask anesthesia was obtained, the right hand and upper extremity were prepped with ChloraPrep solution before being draped sterilely. Preoperative antibiotics were administered. A timeout was performed to verify the appropriate surgical site before the limb was exsanguinated with an Esmarch and the tourniquet inflated to 250 mmHg.   An approximately 1.5-2 cm incision was made over the volar wrist flexion crease, centered over the palmaris longus tendon. The incision was carried down through the subcutaneous tissues with care taken to identify and protect any neurovascular structures. The distal forearm fascia was penetrated just proximal to the transverse carpal ligament. The soft tissues were released off the superficial and deep surfaces of the distal forearm fascia and this was released proximally for 3-4 cm under direct visualization.  Attention was directed distally. The Soil scientist was passed beneath the transverse carpal ligament along the ulnar aspect of the carpal  tunnel and used to release any adhesions as well as to remove any adherent synovial tissue before first the smaller then the larger of the two dilators were passed beneath the transverse carpal ligament along the ulnar margin of the carpal tunnel. The slotted cannula was introduced and the endoscope was placed into the slotted cannula and the undersurface of the transverse carpal ligament visualized. The distal margin of the transverse carpal ligament was marked by placing a 25-gauge needle percutaneously at [place] cardinal point so that it entered the distal portion of the slotted cannula. Under endoscopic visualization, the transverse carpal ligament was released from proximal to distal using the end-cutting blade. A second pass was performed to ensure complete release of the ligament. The adequacy of release was verified both endoscopically and by palpation using the freer elevator.  The wound was irrigated thoroughly with sterile saline solution before being closed using 4-0 Prolene interrupted sutures. A total of 10 cc of 0.5% plain Sensorcaine was injected in and around the incision before a sterile bulky dressing was applied to the wound. The patient was placed into a volar wrist splint before being awakened, extubated, and returned to the recovery room in satisfactory condition after tolerating the procedure well.

## 2022-05-23 ENCOUNTER — Telehealth: Payer: Self-pay | Admitting: Internal Medicine

## 2022-05-23 ENCOUNTER — Encounter: Payer: Self-pay | Admitting: Surgery

## 2022-05-23 DIAGNOSIS — I700 Atherosclerosis of aorta: Secondary | ICD-10-CM

## 2022-05-23 DIAGNOSIS — E782 Mixed hyperlipidemia: Secondary | ICD-10-CM

## 2022-05-23 NOTE — Telephone Encounter (Signed)
Patient called to follow-up on order for her blood draw for lipid testing prior to appointment on 11/6.

## 2022-05-23 NOTE — Telephone Encounter (Signed)
Called pt who reports needs repeat FLP d/t repatha.  Fasting lab appointment scheduled for 06/21/22.

## 2022-05-27 ENCOUNTER — Ambulatory Visit: Payer: Medicaid Other | Admitting: Nurse Practitioner

## 2022-05-27 ENCOUNTER — Ambulatory Visit: Payer: Medicaid Other

## 2022-06-03 ENCOUNTER — Encounter: Payer: Medicaid Other | Admitting: Nurse Practitioner

## 2022-06-11 ENCOUNTER — Telehealth: Payer: Self-pay | Admitting: Pulmonary Disease

## 2022-06-11 MED ORDER — PREDNISONE 10 MG PO TABS: 40.0000 mg | ORAL_TABLET | Freq: Every day | ORAL | 0 refills | Status: AC

## 2022-06-11 NOTE — Telephone Encounter (Signed)
COPD exacerbation Prednisone 40 mg x 5days Hold on antibiotics as currently afebrile Advised to call or see urgent care if symptoms worsen

## 2022-06-11 NOTE — Telephone Encounter (Signed)
She reports that she is having a sore throat, she is having some chest tightness, some sore throat, that has been going on x 2 days. She is having some green stuff come up and she feels it in her chest. She is taking her inhaler. They are all negative for Covid at this time. She has taken Zicam and some Alka seltzer and it helps some. She reports you had told her at the last OV you told her to call as soon as her symptoms started that she would need to call you. Please advise?   She is ok with a Mychart response as well from the office or the provider.

## 2022-06-17 ENCOUNTER — Telehealth: Payer: Self-pay | Admitting: Psychiatry

## 2022-06-17 ENCOUNTER — Telehealth: Payer: Self-pay | Admitting: Pulmonary Disease

## 2022-06-17 MED ORDER — AMOXICILLIN-POT CLAVULANATE 875-125 MG PO TABS: 1.0000 | ORAL_TABLET | Freq: Two times a day (BID) | ORAL | 0 refills | Status: DC

## 2022-06-17 MED ORDER — PREDNISONE 10 MG PO TABS: ORAL_TABLET | ORAL | 0 refills | Status: DC

## 2022-06-17 NOTE — Telephone Encounter (Signed)
Possible prolonged asthma exacerbation versus development of sinusitis given sinus congestion.  Please send prednisone 20 mg daily for 5 days with subsequent taper to 10 mg daily for 5 days.  In addition, please send Augmentin [phone] 1 tablet twice a day for 10 days.

## 2022-06-17 NOTE — Telephone Encounter (Signed)
Spoke with pt who states her head, ears and lungs feel like they are full of "fluid" and she has a lot of sinus pressure. Pt completed Prednisone on Saturday (10/28) but states she is still coughing. Pt states she is coughing up yellow thick mucus. Pt denies fever/ chills/ GI upset. Pt c/o not being able to sleep and wheezing at night. Pt is currently in [place] but is hoping to be able to fly soon. Dr. Loanne Drilling please advise.

## 2022-06-17 NOTE — Telephone Encounter (Signed)
Spoke with pt and reviewed Dr. Kavin Leech recommendation. Pt stated understanding. Med instructions were reviewed and orders sent in. Pt stated understanding.

## 2022-06-21 ENCOUNTER — Encounter: Payer: Self-pay | Admitting: Adult Health

## 2022-06-21 ENCOUNTER — Telehealth: Payer: Self-pay | Admitting: Adult Health

## 2022-06-21 ENCOUNTER — Ambulatory Visit: Payer: Medicaid Other | Attending: Internal Medicine

## 2022-06-21 ENCOUNTER — Ambulatory Visit: Payer: Medicaid Other | Admitting: Adult Health

## 2022-06-21 VITALS — BP 120/70 | HR 86 | Temp 98.4°F | Ht 68.0 in | Wt 116.4 lb

## 2022-06-21 DIAGNOSIS — J432 Centrilobular emphysema: Secondary | ICD-10-CM

## 2022-06-21 DIAGNOSIS — I700 Atherosclerosis of aorta: Secondary | ICD-10-CM

## 2022-06-21 DIAGNOSIS — E782 Mixed hyperlipidemia: Secondary | ICD-10-CM

## 2022-06-21 LAB — LIPID PANEL
Chol/HDL Ratio: 1.7 ratio (ref 0.0–4.4)
Cholesterol, Total: 164 mg/dL (ref 100–199)
HDL: 95 mg/dL (ref >39)
LDL Chol Calc (NIH): 59 mg/dL (ref 0–99)
Triglycerides: 48 mg/dL (ref 0–149)
VLDL Cholesterol Cal: 10 mg/dL (ref 5–40)

## 2022-06-21 LAB — ALT: ALT: 18 IU/L (ref 0–32)
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
## 2022-06-21 NOTE — Telephone Encounter (Signed)
Patient called into office after unsuccessful visit demanded medications be called in for her and treatment plan. Tried to calm patient and explain to her that she will need further evaluation as her condition has not improved on antibiotics and steroids and concern for steroid induced aggression.  Recommended urgent care or emergency room evaluation  Patient said "Fuck You" and hung up phone.

## 2022-06-21 NOTE — Telephone Encounter (Signed)
Noted. Patient is scheduled to see me on 07/03/22. Normally anxious but calm person at baseline. She has had recent social stressors. NP comments on possible steroid induced aggression should be considered. Will plan to discuss care with patient at next visit. However if this behavior recurs, would be reason for dismissal from our clinic.

## 2022-06-21 NOTE — Assessment & Plan Note (Signed)
Unable to complete visit today - patient with refusal to answer questions, threatening rude behavior to staff and myself.  Explained that we would not be able to see her today due to her behavior continues. Advised will need to be seen by PCP /urgent care or ER .  She left our office . Will forward to Dr. Loanne Drilling.  Marine scientist notified of unsafe behavior of patient to staff.  Please contact office for sooner follow up if symptoms do not improve or worsen or seek emergency care

## 2022-06-24 ENCOUNTER — Ambulatory Visit: Payer: Medicaid Other | Attending: Internal Medicine | Admitting: Internal Medicine

## 2022-06-24 ENCOUNTER — Encounter: Payer: Self-pay | Admitting: Internal Medicine

## 2022-06-24 VITALS — BP 116/68 | HR 70 | Ht 68.0 in | Wt 117.2 lb

## 2022-06-24 DIAGNOSIS — E782 Mixed hyperlipidemia: Secondary | ICD-10-CM | POA: Diagnosis not present

## 2022-06-24 DIAGNOSIS — I700 Atherosclerosis of aorta: Secondary | ICD-10-CM | POA: Diagnosis not present

## 2022-06-24 DIAGNOSIS — F1210 Cannabis abuse, uncomplicated: Secondary | ICD-10-CM

## 2022-06-24 DIAGNOSIS — Z720 Tobacco use: Secondary | ICD-10-CM

## 2022-06-24 DIAGNOSIS — I471 Supraventricular tachycardia, unspecified: Secondary | ICD-10-CM

## 2022-06-24 DIAGNOSIS — F17220 Nicotine dependence, chewing tobacco, uncomplicated: Secondary | ICD-10-CM | POA: Insufficient documentation

## 2022-06-24 NOTE — Patient Instructions (Signed)
Medication Instructions:  NO CHANGES *If you need a refill on your cardiac medications before your next appointment, please call your pharmacy*   Lab Work: NONE If you have labs (blood work) drawn today and your tests are completely normal, you will receive your results only by: [place] (if you have MyChart) OR A paper copy in the mail If you have any lab test that is abnormal or we need to change your treatment, we will call you to review the results.   Testing/Procedures: NONE   Follow-Up: At [facility], you and your health needs are our priority.  As part of our continuing mission to provide you with exceptional heart care, we have created designated Provider Care Teams.  These Care Teams include your primary Cardiologist (physician) and Advanced Practice Providers (APPs -  Physician Assistants and Nurse Practitioners) who all work together to provide you with the care you need, when you need it.  We recommend signing up for the patient portal called "MyChart".  Sign up information is provided on this After Visit Summary.  MyChart is used to connect with patients for Virtual Visits (Telemedicine).  Patients are able to view lab/test results, encounter notes, upcoming appointments, etc.  Non-urgent messages can be sent to your provider as well.   To learn more about what you can do with MyChart, go to [email or web address].    Your next appointment:   1 year(s)  The format for your next appointment:   In Person  Provider:   Werner Lean, MD     Other Instructions NONE  Important Information About Sugar
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
## 2022-06-26 ENCOUNTER — Ambulatory Visit (INDEPENDENT_AMBULATORY_CARE_PROVIDER_SITE_OTHER): Payer: Medicaid Other | Admitting: Nurse Practitioner

## 2022-06-26 ENCOUNTER — Encounter: Payer: Self-pay | Admitting: Nurse Practitioner

## 2022-06-26 VITALS — BP 129/84 | HR 74 | Temp 98.4°F | Ht 66.25 in | Wt 114.7 lb

## 2022-06-26 DIAGNOSIS — F419 Anxiety disorder, unspecified: Secondary | ICD-10-CM

## 2022-06-26 DIAGNOSIS — I700 Atherosclerosis of aorta: Secondary | ICD-10-CM | POA: Diagnosis not present

## 2022-06-26 DIAGNOSIS — E782 Mixed hyperlipidemia: Secondary | ICD-10-CM

## 2022-06-26 DIAGNOSIS — J432 Centrilobular emphysema: Secondary | ICD-10-CM | POA: Diagnosis not present

## 2022-06-26 DIAGNOSIS — R490 Dysphonia: Secondary | ICD-10-CM

## 2022-06-26 DIAGNOSIS — J42 Unspecified chronic bronchitis: Secondary | ICD-10-CM

## 2022-06-26 DIAGNOSIS — E538 Deficiency of other specified B group vitamins: Secondary | ICD-10-CM

## 2022-06-26 DIAGNOSIS — Z114 Encounter for screening for human immunodeficiency virus [HIV]: Secondary | ICD-10-CM

## 2022-06-26 DIAGNOSIS — Z1159 Encounter for screening for other viral diseases: Secondary | ICD-10-CM

## 2022-06-26 DIAGNOSIS — R7303 Prediabetes: Secondary | ICD-10-CM

## 2022-06-26 DIAGNOSIS — M797 Fibromyalgia: Secondary | ICD-10-CM

## 2022-06-26 DIAGNOSIS — Z0000 Encounter for general adult medical examination without abnormal findings: Secondary | ICD-10-CM

## 2022-06-26 LAB — URINALYSIS, ROUTINE W REFLEX MICROSCOPIC
Bilirubin, UA: NEGATIVE
Glucose, UA: NEGATIVE
Ketones, UA: NEGATIVE
Leukocytes,UA: NEGATIVE
Nitrite, UA: NEGATIVE
Protein,UA: NEGATIVE
RBC, UA: NEGATIVE
Specific Gravity, UA: 1.015 (ref 1.005–1.030)
Urobilinogen, Ur: 0.2 mg/dL (ref 0.2–1.0)
pH, UA: 7.5 (ref 5.0–7.5)

## 2022-06-26 MED FILL — Albuterol Sulfate Inhal Aero 108 MCG/ACT (90MCG Base Equiv): RESPIRATORY_TRACT | 75 days supply | Qty: 25.5 | Fill #0 | Status: CN

## 2022-06-26 NOTE — Assessment & Plan Note (Signed)
Chronic. On Stioloto and [employer] PRN.  Followed by Pulmonology. Continue with CT lung. Continue per Pulmonology's recommendations.  Has been sick recently.  Recommend completing course of Augmentin.  Follow up in 6 months.  Call sooner if concerns arise.

## 2022-06-26 NOTE — Assessment & Plan Note (Signed)
Chronic. Does not tolerate statins.  On repatha per cardiology.  Continue with medication regimen. Follow up in 6 months.  Call sooner if concerns arise.

## 2022-06-26 NOTE — Assessment & Plan Note (Signed)
Chronic. Ongoing. On Cymbalta to help with pain.  Takes Cymbalta '30mg'$  daily and '60mg'$  PRN for flares.  Feels like her mood is well controlled.  Follow up in 6 months.  Call sooner if concerns arise.

## 2022-06-26 NOTE — Assessment & Plan Note (Signed)
Labs ordered at visit today.  Will make recommendations based on lab results.   

## 2022-06-26 NOTE — Assessment & Plan Note (Signed)
Chronic.  Controlled.  Continue with current medication regimen of Repatha.  Return to clinic in 6 months for reevaluation.  Call sooner if concerns arise.

## 2022-06-26 NOTE — Assessment & Plan Note (Signed)
Chronic.  Controlled.  Continue with current medication regimen of Cymbalta.  Return to clinic in 6 months for reevaluation.  Call sooner if concerns arise.

## 2022-06-26 NOTE — Assessment & Plan Note (Signed)
Chronic. On Stioloto and [employer] PRN.  Followed by Pulmonology. Continue with CT lung. Continue per Pulmonology's recommendations.  Follow up in 6 months.  Call sooner if concerns arise.

## 2022-06-27 ENCOUNTER — Telehealth: Payer: Self-pay

## 2022-06-27 DIAGNOSIS — M797 Fibromyalgia: Secondary | ICD-10-CM

## 2022-06-27 LAB — COMPREHENSIVE METABOLIC PANEL
ALT: 72 IU/L — ABNORMAL HIGH (ref 0–32)
AST: 48 IU/L — ABNORMAL HIGH (ref 0–40)
Albumin/Globulin Ratio: 2.4 — ABNORMAL HIGH (ref 1.2–2.2)
Albumin: 4.5 g/dL (ref 3.9–4.9)
Alkaline Phosphatase: 88 IU/L (ref 44–121)
BUN/Creatinine Ratio: 11 — ABNORMAL LOW (ref 12–28)
BUN: 8 mg/dL (ref 8–27)
Bilirubin Total: <0.2 mg/dL (ref 0.0–1.2)
CO2: 21 mmol/L (ref 20–29)
Calcium: 9.3 mg/dL (ref 8.7–10.3)
Chloride: 102 mmol/L (ref 96–106)
Creatinine, Ser: 0.72 mg/dL (ref 0.57–1.00)
Globulin, Total: 1.9 g/dL (ref 1.5–4.5)
Glucose: 97 mg/dL (ref 70–99)
Potassium: 4.7 mmol/L (ref 3.5–5.2)
Sodium: 135 mmol/L (ref 134–144)
Total Protein: 6.4 g/dL (ref 6.0–8.5)
eGFR: 95 mL/min/{1.73_m2} (ref >59)

## 2022-06-27 LAB — HEPATITIS C ANTIBODY: Hep C Virus Ab: NONREACTIVE

## 2022-06-27 LAB — CBC WITH DIFFERENTIAL/PLATELET
Basophils Absolute: 0.1 10*3/uL (ref 0.0–0.2)
Basos: 2 %
EOS (ABSOLUTE): 0.3 10*3/uL (ref 0.0–0.4)
Eos: 6 %
Hematocrit: 36.3 % (ref 34.0–46.6)
Hemoglobin: 12.4 g/dL (ref 11.1–15.9)
Immature Grans (Abs): 0 10*3/uL (ref 0.0–0.1)
Immature Granulocytes: 0 %
Lymphocytes Absolute: 0.9 10*3/uL (ref 0.7–3.1)
Lymphs: 19 %
MCH: 32.5 pg (ref 26.6–33.0)
MCHC: 34.2 g/dL (ref 31.5–35.7)
MCV: 95 fL (ref 79–97)
Monocytes Absolute: 0.4 10*3/uL (ref 0.1–0.9)
Monocytes: 8 %
Neutrophils Absolute: 3.1 10*3/uL (ref 1.4–7.0)
Neutrophils: 65 %
Platelets: 358 10*3/uL (ref 150–450)
RBC: 3.82 x10E6/uL (ref 3.77–5.28)
RDW: 12 % (ref 11.7–15.4)
WBC: 4.7 10*3/uL (ref 3.4–10.8)

## 2022-06-27 LAB — HEMOGLOBIN A1C
Est. average glucose Bld gHb Est-mCnc: 117 mg/dL
Hgb A1c MFr Bld: 5.7 % — ABNORMAL HIGH (ref 4.8–5.6)

## 2022-06-27 LAB — HIV ANTIBODY (ROUTINE TESTING W REFLEX): HIV Screen 4th Generation wRfx: NONREACTIVE

## 2022-06-27 LAB — TSH: TSH: 0.961 u[IU]/mL (ref 0.450–4.500)

## 2022-06-27 LAB — VITAMIN B12: Vitamin B-12: 809 pg/mL (ref 232–1245)

## 2022-06-27 NOTE — Telephone Encounter (Signed)
Copied from [place] [id number]. Topic: General - Other >> Jun 27, 2022  2:39 PM [place] B wrote: Reason for CRM: Patient called in states would like to start B12 injuections for 4 mnths to help with her thyroid.

## 2022-06-27 NOTE — Progress Notes (Signed)
Hi Amber Livingston. It was nice to see you yesterday.  Your lab work looks good.  Your A1c remains well controlled at 5.7.  Your liver enzymes are slightly elevated.  We will continue to monitor these in the future.  Your B12 is within normal range.  However, if you would like to try injections we can do them monthly for the next 4 months and see if it improves your fatigue.  No concerns at this time. Continue with your current medication regimen.  Follow up as discussed.  Please let me know if you have any questions.

## 2022-06-28 NOTE — Telephone Encounter (Signed)
Called patient to schedule her first b-12 shot for 07/01/2022 at 9 AM. Are orders placed ?

## 2022-07-01 ENCOUNTER — Other Ambulatory Visit: Payer: Self-pay

## 2022-07-01 ENCOUNTER — Ambulatory Visit (INDEPENDENT_AMBULATORY_CARE_PROVIDER_SITE_OTHER): Payer: Medicaid Other

## 2022-07-01 DIAGNOSIS — M797 Fibromyalgia: Secondary | ICD-10-CM

## 2022-07-01 MED ORDER — CYANOCOBALAMIN 1000 MCG/ML IJ SOLN
1000.0000 ug | INTRAMUSCULAR | Status: AC
  Administered 2022-07-01 – 2022-07-31 (×2): 1000 ug via INTRAMUSCULAR

## 2022-07-01 NOTE — Telephone Encounter (Signed)
Order placed for monthly for 4 injections.

## 2022-07-03 ENCOUNTER — Ambulatory Visit (HOSPITAL_BASED_OUTPATIENT_CLINIC_OR_DEPARTMENT_OTHER): Payer: Medicaid Other | Admitting: Pulmonary Disease

## 2022-07-03 ENCOUNTER — Encounter (HOSPITAL_BASED_OUTPATIENT_CLINIC_OR_DEPARTMENT_OTHER): Payer: Self-pay | Admitting: Pulmonary Disease

## 2022-07-03 VITALS — BP 130/70 | HR 85 | Ht 66.5 in | Wt 115.0 lb

## 2022-07-03 DIAGNOSIS — J432 Centrilobular emphysema: Secondary | ICD-10-CM

## 2022-07-03 MED ORDER — ALBUTEROL SULFATE (2.5 MG/3ML) 0.083% IN NEBU: 2.5000 mg | INHALATION_SOLUTION | Freq: Four times a day (QID) | RESPIRATORY_TRACT | 12 refills | Status: DC | PRN

## 2022-07-03 MED ORDER — PHENYLEPHRINE-DM-GG 5-10-100 MG/5ML PO SYRP: 5.0000 mL | ORAL_SOLUTION | Freq: Two times a day (BID) | ORAL | 0 refills | Status: DC | PRN
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
## 2022-07-03 NOTE — Patient Instructions (Addendum)
Emphysema - persistent symptoms --CONTINUE Flovent 110 mcg TWO puffs in the morning and evening. Rinse mouth out after use --CONTINUE Stiolto 2.5/2.5 mcg TWO puffs ONCE a day. --CONTINUE Albuterol as needed for shortness of breath or wheezing --ORDER nebulizer with meds: Take nightly or as needed  Follow-up with me in 3 months

## 2022-07-05 ENCOUNTER — Telehealth (HOSPITAL_BASED_OUTPATIENT_CLINIC_OR_DEPARTMENT_OTHER): Payer: Self-pay | Admitting: Pulmonary Disease

## 2022-07-05 MED ORDER — AZITHROMYCIN 250 MG PO TABS: ORAL_TABLET | ORAL | 0 refills | Status: DC

## 2022-07-05 NOTE — Telephone Encounter (Signed)
Spoke with pt and placed order for medications. Pt was scheduled for OV with Dr. Loanne Drilling in December. Nothing further needed at this time.

## 2022-07-05 NOTE — Telephone Encounter (Signed)
Reasonable to treat for COPD exacerbation. Please send in azithromycin 500 mg on day 1 followed by 250 mg day 2-5. No steroids due to agitation.  Schedule follow-up with me in 2-4 weeks

## 2022-07-05 NOTE — Telephone Encounter (Signed)
Called and spoke with patient. She stated that since she was seen by JE on 11/15, she has taken a turn for the worst. She has noticed an increase in chest tightness and pressure in the middle of her chest. She had a fever last night that spiked to 101F. Increased fatigue and weakness. She has a non productive cough. She has been unable to go to work due to not feeling well.   She has been using her Flovent as well as the cough syrup that was prescribed on 11/15.   She is scared that she will develop PNA and will have to go the hospital.   Pharmacy is [facility] in [place].   JE, can you please advise? Thanks!

## 2022-07-10 ENCOUNTER — Other Ambulatory Visit: Payer: Self-pay | Admitting: Psychiatry

## 2022-07-29 ENCOUNTER — Ambulatory Visit (INDEPENDENT_AMBULATORY_CARE_PROVIDER_SITE_OTHER): Payer: Medicaid Other | Admitting: Pulmonary Disease

## 2022-07-29 ENCOUNTER — Encounter (HOSPITAL_BASED_OUTPATIENT_CLINIC_OR_DEPARTMENT_OTHER): Payer: Self-pay | Admitting: Pulmonary Disease

## 2022-07-29 VITALS — BP 110/70 | HR 74 | Ht 66.5 in | Wt 117.6 lb

## 2022-07-29 DIAGNOSIS — J432 Centrilobular emphysema: Secondary | ICD-10-CM | POA: Diagnosis not present

## 2022-07-29 MED ORDER — ALBUTEROL SULFATE (2.5 MG/3ML) 0.083% IN NEBU: 2.5000 mg | INHALATION_SOLUTION | Freq: Four times a day (QID) | RESPIRATORY_TRACT | 5 refills | Status: AC | PRN

## 2022-07-29 MED ORDER — PULMICORT FLEXHALER 180 MCG/ACT IN AEPB: 1.0000 | INHALATION_SPRAY | Freq: Two times a day (BID) | RESPIRATORY_TRACT | 5 refills | Status: DC

## 2022-07-29 NOTE — Patient Instructions (Signed)
COPD exacerbation - resolved Emphysema -last exacerbation in October/Nov 2023 --Flovent will be discontinued at the end of this yeart --START Pulmicort ONE puff in the morning and evening. Rinse mouth out after use --CONTINUE Stiolto 2.5/2.5 mcg TWO puffs ONCE a day. --CONTINUE Albuterol as needed for shortness of breath or wheezing --CONTINUE nebulizer with meds: Take nightly or as needed up to 4 times a day  Follow-up with me in March
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
## 2022-07-31 ENCOUNTER — Ambulatory Visit (INDEPENDENT_AMBULATORY_CARE_PROVIDER_SITE_OTHER): Payer: Medicaid Other

## 2022-07-31 ENCOUNTER — Telehealth: Payer: Self-pay | Admitting: Nurse Practitioner

## 2022-07-31 DIAGNOSIS — R5383 Other fatigue: Secondary | ICD-10-CM | POA: Diagnosis not present

## 2022-07-31 DIAGNOSIS — M797 Fibromyalgia: Secondary | ICD-10-CM

## 2022-07-31 NOTE — Telephone Encounter (Signed)
PT came in having a nurse visit

## 2022-08-07 ENCOUNTER — Telehealth: Payer: Self-pay

## 2022-08-07 NOTE — Telephone Encounter (Signed)
PA request received via CMM through [facility] for Pulmicort Flexhaler 180MCG/ACT aerosol powder  PA has been submitted and has been APPROVED from 08/07/2022-08/06/2022.  Key: [id number] - PA Case ID: [id number]

## 2022-08-13 ENCOUNTER — Ambulatory Visit: Payer: Medicaid Other | Admitting: Psychiatry

## 2022-08-20 ENCOUNTER — Ambulatory Visit: Payer: Medicaid Other | Admitting: Psychiatry

## 2022-08-20 ENCOUNTER — Encounter: Payer: Self-pay | Admitting: Psychiatry

## 2022-08-20 VITALS — BP 146/83 | HR 66 | Ht 68.0 in | Wt 124.0 lb

## 2022-08-20 DIAGNOSIS — M797 Fibromyalgia: Secondary | ICD-10-CM | POA: Diagnosis not present

## 2022-08-20 DIAGNOSIS — R413 Other amnesia: Secondary | ICD-10-CM

## 2022-08-20 MED ORDER — DULOXETINE HCL 20 MG PO CPEP: 40.0000 mg | ORAL_CAPSULE | Freq: Every day | ORAL | 6 refills | Status: DC

## 2022-08-20 NOTE — Progress Notes (Signed)
   CC:  memory loss  Follow-up Visit  Last visit: 01/31/22  Brief HPI: 62 year old female with a history of anxiety, depression, HTN, fibromyalgia who follows in clinic for memory loss.   At her last visit brain MRI was ordered. She was interested in genetic testing for dementia and was referred to [facility] for this. Cymbalta was increased to 60 mg daily for fibromyalgia.  Interval History: Memory has been stable since her last visit. She continues to struggle with intermittent word finding difficulty. This mostly occurs when she is speaking with people. She does not struggle as much with texting or writing. Came down with RSV earlier this year and has been dealing with significant fatigue from this. She thinks the 60 mg of Cymbalta is also making her tired.  Brain MRI 02/08/22 showed chronic microvascular ischemic changes and was otherwise unremarkable.    Physical Exam:   Vital Signs: BP (!) 146/83   Pulse 66   Ht '5\' 8"'$  (1.727 m)   Wt 124 lb (56.2 kg)   BMI 18.85 kg/m  GENERAL:  well appearing, in no acute distress, alert  SKIN:  Color, texture, turgor normal. No rashes or lesions HEAD:  Normocephalic/atraumatic. RESP: normal respiratory effort MSK:  No gross joint deformities.   NEUROLOGICAL: Mental Status:     08/20/2022    1:15 PM 01/31/2022    1:01 PM  Montreal Cognitive Assessment   Visuospatial/ Executive (0/5) 5 2  Naming (0/3) 3 3  Attention: Read list of digits (0/2) 2 1  Attention: Read list of letters (0/1) 1 1  Attention: Serial 7 subtraction starting at 100 (0/3) 3 3  Language: Repeat phrase (0/2) 2 2  Language : Fluency (0/1) 0 1  Abstraction (0/2) 2 2  Delayed Recall (0/5) 3 3  Orientation (0/6) 6 5  Total 27 23  Adjusted Score (based on education)  24   Cranial Nerves: PERRL, face symmetric, no dysarthria, hearing grossly intact Motor: moves all extremities equally Gait: normal-based.  IMPRESSION: 62 year old female with a history of anxiety,  depression, HTN, fibromyalgia who presents for follow up of memory loss. Her MOCA score today is slightly improved (27/30) and is in within normal limits. Subjectively she feels her memory is stable. Discussed general brain health measures including MIND diet and aerobic exercise. Will decrease Cymbalta to 40 mg daily to see if this helps with her fatigue.  PLAN: -Decrease Cymbalta to 40 mg daily -Will continue to monitor memory for now. Consider neuropsychological testing if new memory concerns arise   Follow-up: 6 months  I spent a total of 23 minutes on the date of the service. Discussed medication side effects, adverse reactions and drug interactions. Written educational materials and patient instructions outlining all of the above were given.  Genia Harold, MD 08/20/22 1:42 PM

## 2022-08-20 NOTE — Patient Instructions (Signed)
   MIND Diet: The [place] Diet Intervention for Neurodegenerative Delay, or MIND diet, targets the health of the aging brain. Research participants with the highest MIND diet scores had a significantly slower rate of cognitive decline compared with those with the lowest scores. The effects of the MIND diet on cognition showed greater effects than either the Mediterranean or the DASH diet alone.  The healthy items the MIND diet guidelines suggest include:  3+ servings a day of whole grains 1+ servings a day of vegetables (other than green leafy) 6+ servings a week of green leafy vegetables 5+ servings a week of nuts 4+ meals a week of beans 2+ servings a week of berries 2+ meals a week of poultry 1+ meals a week of fish Mainly olive oil if added fat is used  The unhealthy items, which are higher in saturated and trans fat, include: Less than 5 servings a week of pastries and sweets Less than 4 servings a week of red meat (including beef, pork, lamb, and products made from these meats) Less than one serving a week of cheese and fried foods Less than 1 tablespoon a day of butter/stick margarine

## 2022-08-26 NOTE — Patient Instructions (Incomplete)
Rash, Adult  A rash is a change in the color of your skin. A rash can also change the way your skin feels. There are many different conditions and factors that can cause a rash. Follow these instructions at home: The goal of treatment is to stop the itching and keep the rash from spreading. Watch for any changes in your symptoms. Let your doctor know about them. Follow these instructions to help with your condition: Medicine Take or apply over-the-counter and prescription medicines only as told by your doctor. These may include medicines: To treat red or swollen skin (corticosteroid creams). To treat itching. To treat an allergy (oral antihistamines). To treat very bad symptoms (oral corticosteroids).  Skin care Put cool cloths (compresses) on the affected areas. Do not scratch or rub your skin. Avoid covering the rash. Make sure that the rash is exposed to air as much as possible. Managing itching and discomfort Avoid hot showers or baths. These can make itching worse. A cold shower may help. Try taking a bath with: Epsom salts. You can get these at your local pharmacy or grocery store. Follow the instructions on the package. Baking soda. Pour a small amount into the bath as told by your doctor. Colloidal oatmeal. You can get this at your local pharmacy or grocery store. Follow the instructions on the package. Try putting baking soda paste onto your skin. Stir water into baking soda until it gets like a paste. Try putting on a lotion that relieves itchiness (calamine lotion). Keep cool and out of the sun. Sweating and being hot can make itching worse. General instructions  Rest as needed. Drink enough fluid to keep your pee (urine) pale yellow. Wear loose-fitting clothing. Avoid scented soaps, detergents, and perfumes. Use gentle soaps, detergents, perfumes, and other cosmetic products. Avoid anything that causes your rash. Keep a journal to help track what causes your rash. Write  down: What you eat. What cosmetic products you use. What you drink. What you wear. This includes jewelry. Keep all follow-up visits as told by your doctor. This is important. Contact a doctor if: You sweat at night. You lose weight. You pee (urinate) more than normal. You pee less than normal, or you notice that your pee is a darker color than normal. You feel weak. You throw up (vomit). Your skin or the whites of your eyes look yellow (jaundice). Your skin: Tingles. Is numb. Your rash: Does not go away after a few days. Gets worse. You are: More thirsty than normal. More tired than normal. You have: New symptoms. Pain in your belly (abdomen). A fever. Watery poop (diarrhea). Get help right away if: You have a fever and your symptoms suddenly get worse. You start to feel mixed up (confused). You have a very bad headache or a stiff neck. You have very bad joint pains or stiffness. You have jerky movements that you cannot control (seizure). Your rash covers all or most of your body. The rash may or may not be painful. You have blisters that: Are on top of the rash. Grow larger. Grow together. Are painful. Are inside your nose or mouth. You have a rash that: Looks like purple pinprick-sized spots all over your body. Has a "bull's eye" or looks like a target. Is red and painful, causes your skin to peel, and is not from being in the sun too long. Summary A rash is a change in the color of your skin. A rash can also change the way your skin   feels. The goal of treatment is to stop the itching and keep the rash from spreading. Take or apply over-the-counter and prescription medicines only as told by your doctor. Contact a doctor if you have new symptoms or symptoms that get worse. Keep all follow-up visits as told by your doctor. This is important. This information is not intended to replace advice given to you by your health care provider. Make sure you discuss any  questions you have with your health care provider. Document Revised: 02/05/2022 Document Reviewed: 05/17/2021 Elsevier Patient Education  2023 [employer]vier Inc.  

## 2022-08-27 ENCOUNTER — Encounter: Payer: Self-pay | Admitting: Nurse Practitioner

## 2022-08-27 ENCOUNTER — Ambulatory Visit (INDEPENDENT_AMBULATORY_CARE_PROVIDER_SITE_OTHER): Payer: Medicaid Other | Admitting: Nurse Practitioner

## 2022-08-27 VITALS — BP 139/72 | HR 70 | Temp 98.3°F | Ht 67.99 in | Wt 123.2 lb

## 2022-08-27 DIAGNOSIS — R591 Generalized enlarged lymph nodes: Secondary | ICD-10-CM | POA: Diagnosis not present

## 2022-08-27 DIAGNOSIS — R21 Rash and other nonspecific skin eruption: Secondary | ICD-10-CM

## 2022-08-27 DIAGNOSIS — R7989 Other specified abnormal findings of blood chemistry: Secondary | ICD-10-CM | POA: Insufficient documentation

## 2022-08-27 HISTORY — DX: Other specified abnormal findings of blood chemistry: R79.89

## 2022-08-27 HISTORY — DX: Rash and other nonspecific skin eruption: R21

## 2022-08-27 MED ORDER — TRIAMCINOLONE ACETONIDE 0.1 % EX CREA: 1.0000 | TOPICAL_CREAM | Freq: Two times a day (BID) | CUTANEOUS | 0 refills | Status: DC

## 2022-08-27 NOTE — Assessment & Plan Note (Signed)
Noted recent labs, will repeat today and check CBC.  Order for ultrasound RUQ due to ongoing concerns for decreased appetite and rash.

## 2022-08-27 NOTE — Assessment & Plan Note (Signed)
Ongoing since RSV.  She refuses Prednisone today.  Will send in Triamcinolone cream to apply to areas.

## 2022-08-27 NOTE — Progress Notes (Signed)
BP 139/72   Pulse 70   Temp 98.3 F (36.8 C) (Oral)   Ht 5' 7.99" (1.727 m)   Wt 123 lb 3.2 oz (55.9 kg)   SpO2 97%   BMI 18.74 kg/m    Subjective:    Patient ID: Amber Livingston, female    DOB: 07/10/1961, 62 y.o.   MRN: [id number]  HPI: Amber Livingston is a 62 y.o. female  Chief Complaint  Patient presents with   LUMP     On her left neck, and under her left arm pit.   rash    Itchy rash ever since she had RSV.   RASH/LUMP UNDER ARM Has a rash that started mid-summer, voice went and did not come back.  Started to bruise easily and lose hair.  Had RSV after surgery and then started to have rash after and cover body.  Has firm spot to back of neck and under left arm -- this started one week ago - she spoke to neurologist about it.  Remains tired, fatigue, fever, chills.  She is a smoker, quit smoking one year - 1 PPD for decades.  No alcohol use, stopped due to fibromyalgia.  Occasional MJ use.  Recent chest imaging -- benign appearance with emphysema and aortic atherosclerosis.  Recent labs notes elevation in ALT/AST 48/72.   Duration:  chronic  Location: generalized  Itching: yes Burning: no Redness: yes Oozing: no Scaling: no Blisters: no Painful: no Fevers: no Change in detergents/soaps/personal care products: no Recent illness: no Recent travel:no History of same: no Context: fluctuating Alleviating factors: hydrocortisone cream Treatments attempted:hydrocortisone cream and lotion/moisturizer Shortness of breath: no  Throat/tongue swelling: no Myalgias/arthralgias: no   Relevant past medical, surgical, family and social history reviewed and updated as indicated. Interim medical history since our last visit reviewed. Allergies and medications reviewed and updated.  Review of Systems  Constitutional:  Positive for appetite change and fatigue. Negative for activity change, diaphoresis and fever.  Respiratory:  Negative for cough, chest tightness and shortness of  breath.   Cardiovascular:  Negative for chest pain, palpitations and leg swelling.  Gastrointestinal: Negative.   Skin:  Positive for rash.  Neurological: Negative.   Hematological:  Positive for adenopathy.  Psychiatric/Behavioral: Negative.      Per HPI unless specifically indicated above     Objective:    BP 139/72   Pulse 70   Temp 98.3 F (36.8 C) (Oral)   Ht 5' 7.99" (1.727 m)   Wt 123 lb 3.2 oz (55.9 kg)   SpO2 97%   BMI 18.74 kg/m   Wt Readings from Last 3 Encounters:  08/27/22 123 lb 3.2 oz (55.9 kg)  08/20/22 124 lb (56.2 kg)  07/29/22 117 lb 9.6 oz (53.3 kg)    Physical Exam Vitals and nursing note reviewed. Exam conducted with a chaperone present.  Constitutional:      General: She is awake. She is not in acute distress.    Appearance: She is well-developed and well-groomed. She is not ill-appearing or toxic-appearing.  HENT:     Head: Normocephalic.     Right Ear: Hearing, tympanic membrane, ear canal and external ear normal.     Left Ear: Hearing, tympanic membrane, ear canal and external ear normal.     Nose: Nose normal.     Right Sinus: No maxillary sinus tenderness or frontal sinus tenderness.     Left Sinus: No maxillary sinus tenderness or frontal sinus tenderness.     Mouth/Throat:  Mouth: Mucous membranes are moist.     Pharynx: No pharyngeal swelling, oropharyngeal exudate or posterior oropharyngeal erythema.  Eyes:     General: Lids are normal.        Right eye: No discharge.        Left eye: No discharge.     Conjunctiva/sclera: Conjunctivae normal.     Pupils: Pupils are equal, round, and reactive to light.  Neck:     Thyroid: No thyromegaly.     Vascular: No carotid bruit or JVD.  Cardiovascular:     Rate and Rhythm: Normal rate and regular rhythm.     Heart sounds: Normal heart sounds. No murmur heard.    No gallop.  Pulmonary:     Effort: Pulmonary effort is normal.     Breath sounds: Normal breath sounds.  Chest:  Breasts:     Right: Normal.     Left: Normal.  Abdominal:     General: Bowel sounds are normal.     Palpations: Abdomen is soft. There is no hepatomegaly or splenomegaly.  Musculoskeletal:     Cervical back: Normal range of motion and neck supple.     Right lower leg: No edema.     Left lower leg: No edema.  Lymphadenopathy:     Head:     Right side of head: No submental, submandibular, tonsillar, preauricular, posterior auricular or occipital adenopathy.     Left side of head: No submental, submandibular, tonsillar, preauricular, posterior auricular or occipital adenopathy.     Cervical: Cervical adenopathy (posterior left side, firm mobile x 1) present.     Right cervical: No superficial, deep or posterior cervical adenopathy.    Left cervical: Posterior cervical adenopathy present. No superficial or deep cervical adenopathy.     Upper Body:     Right upper body: No supraclavicular, axillary or pectoral adenopathy.     Left upper body: Axillary adenopathy present. No supraclavicular or pectoral adenopathy.  Skin:    General: Skin is warm and dry.     Findings: Rash present.     Comments: Scattered areas of patchy xerosis arms and abdomen.  Neurological:     Mental Status: She is alert and oriented to person, place, and time.  Psychiatric:        Attention and Perception: Attention normal.        Mood and Affect: Mood normal.        Speech: Speech normal.        Behavior: Behavior normal. Behavior is cooperative.        Thought Content: Thought content normal.    Results for orders placed or performed in visit on 06/26/22  CBC with Differential/Platelet  Result Value Ref Range   WBC 4.7 3.4 - 10.8 x10E3/uL   RBC 3.82 3.77 - 5.28 x10E6/uL   Hemoglobin 12.4 11.1 - 15.9 g/dL   Hematocrit 36.3 34.0 - 46.6 %   MCV 95 79 - 97 fL   MCH 32.5 26.6 - 33.0 pg   MCHC 34.2 31.5 - 35.7 g/dL   RDW 12.0 11.7 - 15.4 %   Platelets 358 150 - 450 x10E3/uL   Neutrophils 65 Not Estab. %   Lymphs 19 Not  Estab. %   Monocytes 8 Not Estab. %   Eos 6 Not Estab. %   Basos 2 Not Estab. %   Neutrophils Absolute 3.1 1.4 - 7.0 x10E3/uL   Lymphocytes Absolute 0.9 0.7 - 3.1 x10E3/uL   Monocytes Absolute 0.4 0.1 -  0.9 x10E3/uL   EOS (ABSOLUTE) 0.3 0.0 - 0.4 x10E3/uL   Basophils Absolute 0.1 0.0 - 0.2 x10E3/uL   Immature Granulocytes 0 Not Estab. %   Immature Grans (Abs) 0.0 0.0 - 0.1 x10E3/uL  Comprehensive metabolic panel  Result Value Ref Range   Glucose 97 70 - 99 mg/dL   BUN 8 8 - 27 mg/dL   Creatinine, Ser 0.72 0.57 - 1.00 mg/dL   eGFR 95 >59 mL/min/1.73   BUN/Creatinine Ratio 11 (L) 12 - 28   Sodium 135 134 - 144 mmol/L   Potassium 4.7 3.5 - 5.2 mmol/L   Chloride 102 96 - 106 mmol/L   CO2 21 20 - 29 mmol/L   Calcium 9.3 8.7 - 10.3 mg/dL   Total Protein 6.4 6.0 - 8.5 g/dL   Albumin 4.5 3.9 - 4.9 g/dL   Globulin, Total 1.9 1.5 - 4.5 g/dL   Albumin/Globulin Ratio 2.4 (H) 1.2 - 2.2   Bilirubin Total <0.2 0.0 - 1.2 mg/dL   Alkaline Phosphatase 88 44 - 121 IU/L   AST 48 (H) 0 - 40 IU/L   ALT 72 (H) 0 - 32 IU/L  TSH  Result Value Ref Range   TSH 0.961 0.450 - 4.500 uIU/mL  Urinalysis, Routine w reflex microscopic  Result Value Ref Range   Specific Gravity, UA 1.015 1.005 - 1.030   pH, UA 7.5 5.0 - 7.5   Color, UA Yellow Yellow   Appearance Ur Clear Clear   Leukocytes,UA Negative Negative   Protein,UA Negative Negative/Trace   Glucose, UA Negative Negative   Ketones, UA Negative Negative   RBC, UA Negative Negative   Bilirubin, UA Negative Negative   Urobilinogen, Ur 0.2 0.2 - 1.0 mg/dL   Nitrite, UA Negative Negative   Microscopic Examination Comment   HIV Antibody (routine testing w rflx)  Result Value Ref Range   HIV Screen 4th Generation wRfx Non Reactive Non Reactive  HgB A1c  Result Value Ref Range   Hgb A1c MFr Bld 5.7 (H) 4.8 - 5.6 %   Est. average glucose Bld gHb Est-mCnc 117 mg/dL  Hepatitis C Antibody  Result Value Ref Range   Hep C Virus Ab Non Reactive Non  Reactive  B12  Result Value Ref Range   Vitamin B-12 809 232 - 1,245 pg/mL      Assessment & Plan:   Problem List Items Addressed This Visit       Musculoskeletal and Integument   Rash    Ongoing since RSV.  She refuses Prednisone today.  Will send in Triamcinolone cream to apply to areas.        Immune and Lymphatic   Lymphadenopathy - Primary    To left cervical posterior chain and under left axilla. ?reactive post RSV.  Labs obtained today due to other underlying symptoms reported: decreased appetite, weight loss(although weight stable today), rash, fatigue, fever, chills.  Obtain ultrasound soft tissue neck and mammogram + ultrasound left breast.  Determine next steps after return of all work-up.      Relevant Orders   CBC with Differential/Platelet   Comprehensive metabolic panel   TSH   Lactate Dehydrogenase (LDH)   US Soft Tissue Head/Neck (NON-THYROID)   US BREAST LTD UNI LEFT INC AXILLA   MM DIAG BREAST TOMO BILATERAL     Other   Elevated LFTs    Noted recent labs, will repeat today and check CBC.  Order for ultrasound RUQ due to ongoing concerns for decreased appetite and  rash.      Relevant Orders   US Abdomen Limited RUQ (LIVER/GB)     Follow up plan: Return in about 2 weeks (around 09/10/2022) for With Santiago Glad for lymphadenopathy and rash (scheduled for 26th).

## 2022-08-27 NOTE — Assessment & Plan Note (Signed)
To left cervical posterior chain and under left axilla. ?reactive post RSV.  Labs obtained today due to other underlying symptoms reported: decreased appetite, weight loss(although weight stable today), rash, fatigue, fever, chills.  Obtain ultrasound soft tissue neck and mammogram + ultrasound left breast.  Determine next steps after return of all work-up.

## 2022-08-28 ENCOUNTER — Other Ambulatory Visit: Payer: Self-pay | Admitting: Nurse Practitioner

## 2022-08-28 LAB — CBC WITH DIFFERENTIAL/PLATELET
Basophils Absolute: 0.1 10*3/uL (ref 0.0–0.2)
Basos: 2 %
EOS (ABSOLUTE): 0.2 10*3/uL (ref 0.0–0.4)
Eos: 5 %
Hematocrit: 34.4 % (ref 34.0–46.6)
Hemoglobin: 11.7 g/dL (ref 11.1–15.9)
Immature Grans (Abs): 0 10*3/uL (ref 0.0–0.1)
Immature Granulocytes: 0 %
Lymphocytes Absolute: 0.8 10*3/uL (ref 0.7–3.1)
Lymphs: 21 %
MCH: 32.3 pg (ref 26.6–33.0)
MCHC: 34 g/dL (ref 31.5–35.7)
MCV: 95 fL (ref 79–97)
Monocytes Absolute: 0.4 10*3/uL (ref 0.1–0.9)
Monocytes: 10 %
Neutrophils Absolute: 2.4 10*3/uL (ref 1.4–7.0)
Neutrophils: 62 %
Platelets: 292 10*3/uL (ref 150–450)
RBC: 3.62 x10E6/uL — ABNORMAL LOW (ref 3.77–5.28)
RDW: 11.9 % (ref 11.7–15.4)
WBC: 3.7 10*3/uL (ref 3.4–10.8)

## 2022-08-28 LAB — COMPREHENSIVE METABOLIC PANEL
ALT: 24 IU/L (ref 0–32)
AST: 21 IU/L (ref 0–40)
Albumin/Globulin Ratio: 2.3 — ABNORMAL HIGH (ref 1.2–2.2)
Albumin: 4.9 g/dL (ref 3.9–4.9)
Alkaline Phosphatase: 65 IU/L (ref 44–121)
BUN/Creatinine Ratio: 7 — ABNORMAL LOW (ref 12–28)
BUN: 5 mg/dL — ABNORMAL LOW (ref 8–27)
Bilirubin Total: 0.5 mg/dL (ref 0.0–1.2)
CO2: 21 mmol/L (ref 20–29)
Calcium: 9.5 mg/dL (ref 8.7–10.3)
Chloride: 95 mmol/L — ABNORMAL LOW (ref 96–106)
Creatinine, Ser: 0.69 mg/dL (ref 0.57–1.00)
Globulin, Total: 2.1 g/dL (ref 1.5–4.5)
Glucose: 83 mg/dL (ref 70–99)
Potassium: 4.3 mmol/L (ref 3.5–5.2)
Sodium: 131 mmol/L — ABNORMAL LOW (ref 134–144)
Total Protein: 7 g/dL (ref 6.0–8.5)
eGFR: 99 mL/min/{1.73_m2} (ref >59)

## 2022-08-28 LAB — LACTATE DEHYDROGENASE: LDH: 196 IU/L (ref 119–226)

## 2022-08-28 LAB — TSH: TSH: 1.23 u[IU]/mL (ref 0.450–4.500)

## 2022-08-28 NOTE — Progress Notes (Signed)
Contacted via MyChart   Good morning Amber Livingston, your labs have returned and are overall stable, including LDH which we look at for cancer at times.  The only thing off which we will need to recheck is your sodium -- this is low.  I recommend adding a little table salt to diet daily and we can recheck this.  Low sodium levels can make you feel bad.  Any questions? Keep being amazing!!  Thank you for allowing me to participate in your care.  I appreciate you. Kindest regards, Elim Peale

## 2022-08-29 ENCOUNTER — Other Ambulatory Visit: Payer: Self-pay

## 2022-08-29 MED ORDER — MELOXICAM 15 MG PO TABS
15.0000 mg | ORAL_TABLET | Freq: Every day | ORAL | 1 refills | Status: DC | PRN
  Filled 2022-08-29 – 2022-09-02 (×2): qty 30, 30d supply, fill #0
  Filled 2022-10-01: qty 30, 30d supply, fill #1

## 2022-09-02 ENCOUNTER — Other Ambulatory Visit: Payer: Self-pay

## 2022-09-03 ENCOUNTER — Ambulatory Visit
Admission: RE | Admit: 2022-09-03 | Discharge: 2022-09-03 | Disposition: A | Discharge status: Home / Self Care | Payer: Medicaid Other | Source: Ambulatory Visit | Attending: Nurse Practitioner | Admitting: Nurse Practitioner

## 2022-09-03 ENCOUNTER — Ambulatory Visit: Payer: Medicaid Other

## 2022-09-03 DIAGNOSIS — R591 Generalized enlarged lymph nodes: Secondary | ICD-10-CM | POA: Insufficient documentation

## 2022-09-03 DIAGNOSIS — R7989 Other specified abnormal findings of blood chemistry: Secondary | ICD-10-CM | POA: Diagnosis present

## 2022-09-03 NOTE — Progress Notes (Signed)
Contacted via [place] afternoon Lorraine, your ultrasound has returned and overall is normal.  No acute findings.  Have a great afternoon!!

## 2022-09-04 NOTE — Progress Notes (Signed)
Contacted via MyChart   Good morning Thresea your imaging has returned overall it shows a normal lymph node no concerns for cancer or other findings on imaging.  Any questions? Keep being amazing!!  Thank you for allowing me to participate in your care.  I appreciate you. Kindest regards, Danniel Tones

## 2022-09-06 ENCOUNTER — Ambulatory Visit
Admission: RE | Admit: 2022-09-06 | Discharge: 2022-09-06 | Disposition: A | Discharge status: Home / Self Care | Payer: Medicaid Other | Source: Ambulatory Visit | Attending: Nurse Practitioner | Admitting: Nurse Practitioner

## 2022-09-06 ENCOUNTER — Other Ambulatory Visit: Payer: Self-pay | Admitting: Nurse Practitioner

## 2022-09-06 DIAGNOSIS — R21 Rash and other nonspecific skin eruption: Secondary | ICD-10-CM

## 2022-09-06 DIAGNOSIS — R591 Generalized enlarged lymph nodes: Secondary | ICD-10-CM | POA: Insufficient documentation

## 2022-09-06 DIAGNOSIS — R7989 Other specified abnormal findings of blood chemistry: Secondary | ICD-10-CM

## 2022-09-09 ENCOUNTER — Telehealth: Payer: Self-pay | Admitting: Pulmonary Disease
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
## 2022-09-10 NOTE — Telephone Encounter (Signed)
PT did not hear her phone ring. Pls try to call again. TY  [phone]

## 2022-09-10 NOTE — Telephone Encounter (Signed)
Attempted to call pt but unable to reach. Left message to return call.

## 2022-09-11 ENCOUNTER — Encounter: Payer: Self-pay | Admitting: Nurse Practitioner

## 2022-09-11 ENCOUNTER — Other Ambulatory Visit: Payer: Self-pay

## 2022-09-11 ENCOUNTER — Ambulatory Visit: Payer: Medicaid Other | Admitting: Nurse Practitioner

## 2022-09-11 VITALS — BP 128/78 | HR 80 | Temp 98.3°F | Ht 67.99 in | Wt 120.9 lb

## 2022-09-11 DIAGNOSIS — R591 Generalized enlarged lymph nodes: Secondary | ICD-10-CM

## 2022-09-11 MED ORDER — BUDESONIDE-FORMOTEROL FUMARATE 80-4.5 MCG/ACT IN AERO: 2.0000 | INHALATION_SPRAY | Freq: Four times a day (QID) | RESPIRATORY_TRACT | 12 refills | Status: DC

## 2022-09-11 NOTE — Telephone Encounter (Signed)
My apologies I thought I included that. Patient states its tiotropium
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
## 2022-09-11 NOTE — Assessment & Plan Note (Signed)
Followed up today.  Imaging unremarkable.  Lymph nodes found were not concerning.  Discussed with patient during visit today.  Follow up in 3 months.  Call sooner if concerns arise.
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
## 2022-09-13 ENCOUNTER — Other Ambulatory Visit: Payer: Self-pay | Admitting: Psychiatry

## 2022-09-16 NOTE — Telephone Encounter (Signed)
Pharmacy sent request for 90 day supply. Rx sent.

## 2022-09-19 ENCOUNTER — Telehealth: Payer: Self-pay | Admitting: Nurse Practitioner

## 2022-09-19 NOTE — Telephone Encounter (Signed)
PT is currently recovering from RSV, she was not able to go on her trip that she had paid for.  She is requesting a note or call to be able to get her money back from her trip that she had scheduled.  Please advise.

## 2022-09-20 NOTE — Telephone Encounter (Signed)
The patient called back in stating she contracted the RSV in October in [place]. She was officially diagnosed back in November was she saw Santiago Glad for her physical. Her trip was supposed to be today. She spoke with the airlines and got a waiver for her ticket. Please assist patient further.

## 2022-09-20 NOTE — Telephone Encounter (Signed)
Called and LVM asking for patient to please return my call. Need some additional information from the patient- when and where was she diagnosed with RSV? When was her trip scheduled for?  OK for PEC to speak to patient and find out the above information if she calls back.

## 2022-09-23 NOTE — Telephone Encounter (Signed)
We do not diagnose RSV in our office since we don't have the test.

## 2022-09-25 NOTE — Telephone Encounter (Signed)
Called and LVM asking for patient to please return my call.   OK for PEC to speak to patient and advise her that we cannot write a letter for her as we did not diagnose her with RSV. We do not test or diagnose this in our office.

## 2022-09-25 NOTE — Telephone Encounter (Signed)
Pt advised we can not write letter / Amber Livingston

## 2022-10-02 ENCOUNTER — Other Ambulatory Visit: Payer: Self-pay | Admitting: Internal Medicine

## 2022-10-02 DIAGNOSIS — E782 Mixed hyperlipidemia: Secondary | ICD-10-CM

## 2022-10-02 DIAGNOSIS — I700 Atherosclerosis of aorta: Secondary | ICD-10-CM

## 2022-10-02 NOTE — Telephone Encounter (Signed)
Patient called back stating that she had a recent VM from the office. Advise patient of last message left in regard to a letter she wanted her provider to write. Patient verbal understanding.

## 2022-10-07 ENCOUNTER — Ambulatory Visit (HOSPITAL_BASED_OUTPATIENT_CLINIC_OR_DEPARTMENT_OTHER): Payer: Medicaid Other | Admitting: Pulmonary Disease

## 2022-10-18 ENCOUNTER — Telehealth (HOSPITAL_BASED_OUTPATIENT_CLINIC_OR_DEPARTMENT_OTHER): Payer: Self-pay | Admitting: Pulmonary Disease

## 2022-10-18 DIAGNOSIS — G4734 Idiopathic sleep related nonobstructive alveolar hypoventilation: Secondary | ICD-10-CM

## 2022-10-18 NOTE — Telephone Encounter (Signed)
[facility] Telephone Encounter  ONO 10/04/22 completed  SpO2 <88% 2 hours 2 min 28sec Nadir SpO2 84%  Nocturnal hypoxemia Recommend 1L O2 via [place] Discussed with patient who agrees with starting therapy.  Staff please arrange 1L O2 via  nightly with DME

## 2022-10-18 NOTE — Telephone Encounter (Signed)
Order placed for pt to begin O2 at night based off of ONO. Nothing further needed.

## 2022-10-28 ENCOUNTER — Encounter (HOSPITAL_BASED_OUTPATIENT_CLINIC_OR_DEPARTMENT_OTHER): Payer: Self-pay | Admitting: Pulmonary Disease

## 2022-10-28 ENCOUNTER — Ambulatory Visit (HOSPITAL_BASED_OUTPATIENT_CLINIC_OR_DEPARTMENT_OTHER): Payer: Medicaid Other | Admitting: Pulmonary Disease

## 2022-10-28 VITALS — BP 122/80 | HR 78 | Ht 67.99 in | Wt 122.4 lb

## 2022-10-28 DIAGNOSIS — R0602 Shortness of breath: Secondary | ICD-10-CM

## 2022-10-28 DIAGNOSIS — J432 Centrilobular emphysema: Secondary | ICD-10-CM | POA: Diagnosis not present

## 2022-10-28 MED ORDER — STIOLTO RESPIMAT 2.5-2.5 MCG/ACT IN AERS: 2.0000 | INHALATION_SPRAY | Freq: Every day | RESPIRATORY_TRACT | 5 refills | Status: DC
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
## 2022-10-28 NOTE — Patient Instructions (Addendum)
Emphysema -last exacerbation in October/Nov 2023. Improved symptoms with LABA/LAMA addition --STOP Symbicort --RESTART Stiolto TWO puffs in the morning  --CONTINUE Pulmicort 180 mcg ONE puff in the morning and evening. Rinse mouth after use --CONTINUE Albuterol as needed for shortness of breath or wheezing --CONTINUE nebulizer with meds: Take nightly or as needed up to 4 times a day --Continue regular aerobic exercise up to 5 days a week  Hoarseness --OBTAIN records from [facility] with Dr. Huey Romans, M.D  Follow-up with me in 3 months

## 2022-11-07 ENCOUNTER — Ambulatory Visit: Payer: Medicaid Other | Admitting: Physician Assistant

## 2022-11-07 ENCOUNTER — Encounter: Payer: Self-pay | Admitting: Physician Assistant

## 2022-11-07 VITALS — BP 130/76 | HR 72 | Temp 98.2°F | Wt 123.3 lb

## 2022-11-07 DIAGNOSIS — L309 Dermatitis, unspecified: Secondary | ICD-10-CM

## 2022-11-07 MED ORDER — TRIAMCINOLONE ACETONIDE 0.1 % EX CREA: 1.0000 | TOPICAL_CREAM | Freq: Two times a day (BID) | CUTANEOUS | 0 refills | Status: DC

## 2022-11-07 NOTE — Progress Notes (Signed)
Acute Office Visit   Patient: Amber Livingston   DOB: 1961/06/22   62 y.o. Female  MRN: [id number] Visit Date: 11/07/2022  Today's healthcare provider: Dani Gobble Keionte Swicegood, PA-C  Introduced myself to the patient as a Journalist, newspaper and provided education on APPs in clinical practice.    Chief Complaint  Patient presents with   Rash    Patient says she went outside and was sitting on her patio and she felt as if something bit her on her R side of her Groin area. Patient says the whole area was burning and swollen. Patient says this morning the layer of skin peeled off. Patient says she was diagnosed back with RSV and it has caused some severe issues.    Subjective    Rash  HPI     Rash    Additional comments: Patient says she went outside and was sitting on her patio and she felt as if something bit her on her R side of her Groin area. Patient says the whole area was burning and swollen. Patient says this morning the layer of skin peeled off. Patient says she was diagnosed back with RSV and it has caused some severe issues.       Last edited by Irena Reichmann, [place] on 11/07/2022  2:27 PM.       Rash  Duration:  days - happened 2 days ago  Location: groin  Itching: no Burning: yes and stinging  Redness: yes Oozing: no Scaling: no Blisters: no Painful: yes Fevers: no Change in detergents/soaps/personal care products: no Recent illness: no Recent travel:no History of same: no Context: better Alleviating factors: nothing Treatments attempted:hydrocortisone cream Shortness of breath: no  Throat/tongue swelling: no Myalgias/arthralgias: no      Medications: Outpatient Medications Prior to Visit  Medication Sig   albuterol (PROVENTIL) (2.5 MG/3ML) 0.083% nebulizer solution Take 3 mLs (2.5 mg total) by nebulization every 6 (six) hours as needed for wheezing or shortness of breath.   BEE POLLEN PO Take 1 capsule by mouth daily.   budesonide (PULMICORT FLEXHALER) 180 MCG/ACT  inhaler Inhale 1 puff into the lungs in the morning and at bedtime.   D-Ribose (RIBOSE, D,) POWD 2,500 mg by Does not apply route 3 (three) times daily.   diphenhydramine-acetaminophen (TYLENOL PM) 25-500 MG TABS tablet Take 2 tablets by mouth at bedtime as needed.   DULoxetine (CYMBALTA) 20 MG capsule TAKE 2 CAPSULES BY MOUTH EVERY DAY   estradiol (ESTRACE) 0.5 MG tablet TAKE 1 TABLET BY MOUTH ONCE DAILY   Evolocumab (REPATHA SURECLICK) [id number] MG/ML SOAJ INJECT 1 PEN. INTO THE SKIN EVERY 14 (FOURTEEN) DAYS.   fluticasone (FLOVENT HFA) 110 MCG/ACT inhaler SMARTSIG:2 Puff(s) Via Inhaler Morning-Night   MAGNESIUM BISGLYCINATE PO Take 1,000 mg by mouth daily.   medroxyPROGESTERone (PROVERA) 2.5 MG tablet TAKE 1 TABLET BY MOUTH ONCE DAILY   melatonin 1 MG TABS tablet Take 2 mg by mouth at bedtime.   mometasone (ELOCON) 0.1 % ointment Apply topically as needed. Qhs to aa fingers hands up to 5 days a week until clear, then prn flares   nicotine polacrilex (COMMIT) 4 MG lozenge Take 4 mg by mouth as needed for smoking cessation.   NON FORMULARY as needed. CBD gummies   pantoprazole (PROTONIX) 40 MG tablet Take 40 mg by mouth daily as needed.   Phenylephrine-DM-GG 5-10-100 MG/5ML SYRP Take 5 mLs by mouth 2 (two) times daily as needed.   Tiotropium Bromide-Olodaterol (STIOLTO  RESPIMAT) 2.5-2.5 MCG/ACT AERS Inhale 2 puffs into the lungs once daily at 2 PM.   Turmeric (QC TUMERIC COMPLEX PO) Take 1,000 mg by mouth daily.   [DISCONTINUED] triamcinolone cream (KENALOG) 0.1 % Apply 1 Application topically 2 (two) times daily.   meloxicam (MOBIC) 15 MG tablet Take 1 tablet (15 mg total) by mouth daily as needed for pain. (Patient not taking: Reported on 11/07/2022)   PROAIR HFA 108 (90 Base) MCG/ACT inhaler INHALE 2 PUFFS BY MOUTH EVERY 6 HOURS AS NEEDED FOR WHEEZING OR SHORT OF BREATH   No facility-administered medications prior to visit.    Review of Systems  Skin:  Positive for rash.       Objective     BP 130/76   Pulse 72   Temp 98.2 F (36.8 C) (Oral)   Wt 123 lb 4.8 oz (55.9 kg)   SpO2 95%   BMI 18.75 kg/m    Physical Exam Vitals reviewed.  Constitutional:      General: She is awake.     Appearance: Normal appearance. She is well-developed and well-groomed.  HENT:     Head: Normocephalic and atraumatic.  Pulmonary:     Effort: Pulmonary effort is normal.  Musculoskeletal:     Cervical back: Normal range of motion.  Skin:    General: Skin is warm.     Findings: Erythema and rash present. Rash is macular and scaling. Rash is not crusting, papular, purpuric, pustular, urticarial or vesicular.  Neurological:     General: No focal deficit present.     Mental Status: She is alert and oriented to person, place, and time. Mental status is at baseline.  Psychiatric:        Attention and Perception: Attention and perception normal.        Mood and Affect: Mood and affect normal.        Speech: Speech is tangential.        Behavior: Behavior normal. Behavior is cooperative.      No results found for any visits on 11/07/22.  Assessment & Plan      No follow-ups on file.     Problem List Items Addressed This Visit   None Visit Diagnoses     Dermatitis    -  Primary Acute, new concern. Patient reports sudden appearance of rash along inner thigh and groin area starting 2 days ago.  She reports that rash/skin eruption has changed in appearance since it initially occurred.  She reports initial appearance had a large blister and prominent swelling. PE today reveals erythematous macular rash with slight scaling along inner thigh.  No evidence of swelling, drainage, ulcerations, increased warmth. Patient reports persistent burning along area and stinging that is not relieved with over-the-counter hydrocortisone. Suspect potential irritant dermatitis or insect bite at this time. Will provide topical Kenalog cream to assist with discomfort.  Informed patient not to use longer  than 14 days to prevent skin thinning.  Also recommend using small amount of lidocaine cream over the area to further help with discomfort. Recommend avoiding harsh soaps, new lotions, detergents until area is free of rash. Follow-up as needed for persistent or progressing symptoms   Relevant Medications   triamcinolone cream (KENALOG) 0.1 %        No follow-ups on file.   I, Leverne Tessler E Kerah Hardebeck, PA-C, have reviewed all documentation for this visit. The documentation on 11/07/22 for the exam, diagnosis, procedures, and orders are all accurate and complete.   Junie Panning  Eilee Schader, MHS, PA-C [place]

## 2022-11-07 NOTE — Patient Instructions (Signed)
For your rash I recommend the following:  Please use the kenalog cream twice per day for 7-10 days to help with inflammation and burning You can continue to use the Arnica cream  If needed you can use a bit of Lidocaine cream as well - this is available over the counter Avoid harsh detergents, soaps or lotions on the area while it is healing

## 2022-11-13 ENCOUNTER — Encounter: Payer: Self-pay | Admitting: Nurse Practitioner

## 2022-11-13 ENCOUNTER — Ambulatory Visit: Payer: Medicaid Other | Admitting: Nurse Practitioner

## 2022-11-13 VITALS — BP 148/89 | HR 80 | Temp 98.0°F | Wt 123.3 lb

## 2022-11-13 DIAGNOSIS — S91319A Laceration without foreign body, unspecified foot, initial encounter: Secondary | ICD-10-CM

## 2022-11-13 DIAGNOSIS — M797 Fibromyalgia: Secondary | ICD-10-CM | POA: Diagnosis not present

## 2022-11-13 DIAGNOSIS — K219 Gastro-esophageal reflux disease without esophagitis: Secondary | ICD-10-CM | POA: Diagnosis not present

## 2022-11-13 NOTE — Assessment & Plan Note (Signed)
Referral placed for patient to see pain management.  She is no longer seeing her surgeon who was giving her about 7 tylenol 3 monthly to help with pain.

## 2022-11-13 NOTE — Progress Notes (Signed)
BP (!) 148/89 (BP Location: Right Arm, Cuff Size: Normal)   Pulse 80   Temp 98 F (36.7 C) (Oral)   Wt 123 lb 4.8 oz (55.9 kg)   SpO2 98%   BMI 18.75 kg/m    Subjective:    Patient ID: Amber Livingston, female    DOB: 09-23-1960, 62 y.o.   MRN: [id number]  HPI: Amber Livingston is a 62 y.o. female  Chief Complaint  Patient presents with   GI Problem    Pt states she has been having issues with the "flap" in her mouth. States that the acid and fluid from her stomach comes up. States she has seen ENT twice. States she also has trouble speaking because of the strain it is putting on her throat.    Patient states she has recently seen ENT.  States she has fluid leaking up in her mouth and the "flap" is leaking.  They recommended that she see GI for further evaluation.  She is not having acid reflux symptoms.  She takes bee pollen everyday to help with her digestion.  She drinks a berry smoothie, vegetable juice, creatine supplement.  It hurts for her to talk.  States she has a "major swallowing issue and needed heimlich maneuver twice".  She states she is having cuts that come and go on her feet and hands.  She has been seen by dermatology in the past.  She previously had no nails and her cuticles were cut up.  It is causing her to have difficulty walking due to the cuts on her foot.  She is fighting pain throughout her body.  She can't do steroid injections due to bad reactions.   Relevant past medical, surgical, family and social history reviewed and updated as indicated. Interim medical history since our last visit reviewed. Allergies and medications reviewed and updated.  Review of Systems  Per HPI unless specifically indicated above     Objective:    BP (!) 148/89 (BP Location: Right Arm, Cuff Size: Normal)   Pulse 80   Temp 98 F (36.7 C) (Oral)   Wt 123 lb 4.8 oz (55.9 kg)   SpO2 98%   BMI 18.75 kg/m   Wt Readings from Last 3 Encounters:  11/13/22 123 lb 4.8 oz (55.9 kg)   11/07/22 123 lb 4.8 oz (55.9 kg)  10/28/22 122 lb 6.4 oz (55.5 kg)    Physical Exam Vitals and nursing note reviewed.  Constitutional:      General: She is not in acute distress.    Appearance: Normal appearance. She is normal weight. She is not ill-appearing, toxic-appearing or diaphoretic.  HENT:     Head: Normocephalic.     Right Ear: External ear normal.     Left Ear: External ear normal.     Nose: Nose normal.     Mouth/Throat:     Mouth: Mucous membranes are moist.     Pharynx: Oropharynx is clear.  Eyes:     General:        Right eye: No discharge.        Left eye: No discharge.     Extraocular Movements: Extraocular movements intact.     Conjunctiva/sclera: Conjunctivae normal.     Pupils: Pupils are equal, round, and reactive to light.  Cardiovascular:     Rate and Rhythm: Normal rate and regular rhythm.     Heart sounds: No murmur heard. Pulmonary:     Effort: Pulmonary effort is normal. No respiratory distress.  Breath sounds: Normal breath sounds. No wheezing or rales.  Musculoskeletal:     Cervical back: Normal range of motion and neck supple.       Feet:  Skin:    General: Skin is warm and dry.     Capillary Refill: Capillary refill takes less than 2 seconds.  Neurological:     General: No focal deficit present.     Mental Status: She is alert and oriented to person, place, and time. Mental status is at baseline.  Psychiatric:        Mood and Affect: Mood normal.        Behavior: Behavior normal.        Thought Content: Thought content normal.        Judgment: Judgment normal.     Results for orders placed or performed in visit on 08/27/22  CBC with Differential/Platelet  Result Value Ref Range   WBC 3.7 3.4 - 10.8 x10E3/uL   RBC 3.62 (L) 3.77 - 5.28 x10E6/uL   Hemoglobin 11.7 11.1 - 15.9 g/dL   Hematocrit 34.4 34.0 - 46.6 %   MCV 95 79 - 97 fL   MCH 32.3 26.6 - 33.0 pg   MCHC 34.0 31.5 - 35.7 g/dL   RDW 11.9 11.7 - 15.4 %   Platelets 292  150 - 450 x10E3/uL   Neutrophils 62 Not Estab. %   Lymphs 21 Not Estab. %   Monocytes 10 Not Estab. %   Eos 5 Not Estab. %   Basos 2 Not Estab. %   Neutrophils Absolute 2.4 1.4 - 7.0 x10E3/uL   Lymphocytes Absolute 0.8 0.7 - 3.1 x10E3/uL   Monocytes Absolute 0.4 0.1 - 0.9 x10E3/uL   EOS (ABSOLUTE) 0.2 0.0 - 0.4 x10E3/uL   Basophils Absolute 0.1 0.0 - 0.2 x10E3/uL   Immature Granulocytes 0 Not Estab. %   Immature Grans (Abs) 0.0 0.0 - 0.1 x10E3/uL  Comprehensive metabolic panel  Result Value Ref Range   Glucose 83 70 - 99 mg/dL   BUN 5 (L) 8 - 27 mg/dL   Creatinine, Ser 0.69 0.57 - 1.00 mg/dL   eGFR 99 >59 mL/min/1.73   BUN/Creatinine Ratio 7 (L) 12 - 28   Sodium 131 (L) 134 - 144 mmol/L   Potassium 4.3 3.5 - 5.2 mmol/L   Chloride 95 (L) 96 - 106 mmol/L   CO2 21 20 - 29 mmol/L   Calcium 9.5 8.7 - 10.3 mg/dL   Total Protein 7.0 6.0 - 8.5 g/dL   Albumin 4.9 3.9 - 4.9 g/dL   Globulin, Total 2.1 1.5 - 4.5 g/dL   Albumin/Globulin Ratio 2.3 (H) 1.2 - 2.2   Bilirubin Total 0.5 0.0 - 1.2 mg/dL   Alkaline Phosphatase 65 44 - 121 IU/L   AST 21 0 - 40 IU/L   ALT 24 0 - 32 IU/L  TSH  Result Value Ref Range   TSH 1.230 0.450 - 4.500 uIU/mL  Lactate Dehydrogenase (LDH)  Result Value Ref Range   LDH 196 119 - 226 IU/L      Assessment & Plan:   Problem List Items Addressed This Visit       Other   Fibromyalgia    Referral placed for patient to see pain management.  She is no longer seeing her surgeon who was giving her about 7 tylenol 3 monthly to help with pain.        Relevant Orders   Ambulatory referral to Pain Clinic   Other Visit Diagnoses  Gastroesophageal reflux disease, unspecified whether esophagitis present    -  Primary   referral placed for patient to see GI for further evaluation and management.   Relevant Orders   Ambulatory referral to Gastroenterology   Cut of foot       Referral placed for patient to see Dermatology.   Relevant Orders   Ambulatory  referral to Dermatology        Follow up plan: No follow-ups on file.

## 2022-11-26 ENCOUNTER — Ambulatory Visit: Payer: Medicaid Other | Admitting: Nurse Practitioner

## 2022-11-29 ENCOUNTER — Other Ambulatory Visit: Payer: Self-pay

## 2022-11-29 ENCOUNTER — Telehealth: Payer: Self-pay

## 2022-11-29 DIAGNOSIS — R82998 Other abnormal findings in urine: Secondary | ICD-10-CM

## 2022-11-29 NOTE — Telephone Encounter (Signed)
I contacted the patient via phone. I spoke with her and was unable to offer a same day appointment. The patient will follow up with PCP

## 2022-11-29 NOTE — Telephone Encounter (Signed)
Sent to front office to schedule pt verbalized understands she will be getting a call.

## 2022-11-30 ENCOUNTER — Other Ambulatory Visit: Payer: Self-pay

## 2022-11-30 ENCOUNTER — Emergency Department
Admission: EM | Admit: 2022-11-30 | Discharge: 2022-11-30 | Disposition: A | Discharge status: Home / Self Care | Payer: Medicaid Other | Attending: Emergency Medicine | Admitting: Emergency Medicine

## 2022-11-30 DIAGNOSIS — Z0000 Encounter for general adult medical examination without abnormal findings: Secondary | ICD-10-CM

## 2022-11-30 DIAGNOSIS — R300 Dysuria: Secondary | ICD-10-CM | POA: Insufficient documentation

## 2022-11-30 DIAGNOSIS — I10 Essential (primary) hypertension: Secondary | ICD-10-CM | POA: Diagnosis not present

## 2022-11-30 LAB — URINALYSIS, COMPLETE (UACMP) WITH MICROSCOPIC
Bacteria, UA: NONE SEEN
Bilirubin Urine: NEGATIVE
Glucose, UA: NEGATIVE mg/dL
Hgb urine dipstick: NEGATIVE
Ketones, ur: NEGATIVE mg/dL
Leukocytes,Ua: NEGATIVE
Nitrite: NEGATIVE
Protein, ur: NEGATIVE mg/dL
Specific Gravity, Urine: 1.005 (ref 1.005–1.030)
pH: 6 (ref 5.0–8.0)

## 2022-11-30 LAB — WET PREP, GENITAL
Clue Cells Wet Prep HPF POC: NONE SEEN
Sperm: NONE SEEN
Trich, Wet Prep: NONE SEEN
WBC, Wet Prep HPF POC: <10 — AB (ref <10)
Yeast Wet Prep HPF POC: NONE SEEN

## 2022-11-30 LAB — CHLAMYDIA/NGC RT PCR (ARMC ONLY)
Chlamydia Tr: NOT DETECTED
N gonorrhoeae: NOT DETECTED

## 2022-11-30 NOTE — ED Provider Notes (Signed)
[facility] Provider Note    Event Date/Time   First MD Initiated Contact with Patient 11/30/22 1738     (approximate)  History   Chief Complaint: Dysuria and Abscess  HPI  Amber Livingston is a 62 y.o. female with a past medical history of anxiety, arthritis, fibromyalgia, hypertension, hyperlipidemia, presents to the emergency department with concerns for vaginal fistula.  According to the patient few days ago she was noticing some pain with urination.  She states she was evaluating her vagina today and noted a dot that appeared darker than the surrounding tissue.  Patient states she was researching this on the Internet and is worried she has formed a vaginal fistula.  Patient denies any fever at home but states she has had some chills.  Patient states there is an area of swelling vaginally.  States she called her doctor but was unable to get in with her OB for another month so she came to the emergency department  Physical Exam   Triage Vital Signs: ED Triage Vitals  Enc Vitals Group     BP 11/30/22 1645 (!) 160/103     Pulse Rate 11/30/22 1644 82     Resp 11/30/22 1644 18     Temp 11/30/22 1644 97.6 F (36.4 C)     Temp src --      SpO2 11/30/22 1644 100 %     Weight 11/30/22 1645 160 lb (72.6 kg)     Height 11/30/22 1645 5\' 8"  (1.727 m)     Head Circumference --      Peak Flow --      Pain Score 11/30/22 1644 2     Pain Loc --      Pain Edu? --      Excl. in GC? --     Most recent vital signs: Vitals:   11/30/22 1644 11/30/22 1645  BP:  (!) 160/103  Pulse: 82   Resp: 18   Temp: 97.6 F (36.4 C)   SpO2: 100%     General: Awake, no distress.  Anxious appearing. CV:  Good peripheral perfusion.  Regular rate and rhythm  Resp:  Normal effort.  Equal breath sounds bilaterally.  Abd:  No distention.  Soft, nontender.  No rebound or guarding.  ED Results / Procedures / Treatments    MEDICATIONS ORDERED IN ED: Medications - No data to  display   IMPRESSION / MDM / ASSESSMENT AND PLAN / ED COURSE  I reviewed the triage vital signs and the nursing notes.  Patient's presentation is most consistent with acute presentation with potential threat to life or bodily function.  Patient presents emergency department for concern over possible vaginal fistula.  Pelvic examination performed with nurse chaperone present throughout.  Patient area of concern appears to be the patient's urethra.  There is no other opening or tract.  No concern for vaginal fistula.  Normal amount of vaginal discharge, wet prep and STD swab sent as a precaution.  No swelling noted.  No abscess.  Patient reassured by the normal exam.  We will send a urine sample and await the wet prep results.  Patient agreeable to plan of care and will follow-up with her OB/GYN.  Patient's urinalysis is negative.  Wet prep is negative.  Will discharge with OB follow-up.   FINAL CLINICAL IMPRESSION(S) / ED DIAGNOSES   Medical evaluation   Note:  This document was prepared using Dragon voice recognition software and may include unintentional dictation errors.  Minna Antis, MD 11/30/22 1859

## 2022-11-30 NOTE — ED Triage Notes (Signed)
Pt to ED POV for painful urination and "fistula" at vagina.  Pt getting very irritable when trying to triage pt and ask questions about condition. States sx started a couple days ago

## 2022-11-30 NOTE — ED Notes (Signed)
Patient declined discharge vital signs. 

## 2022-12-02 LAB — URINE CULTURE

## 2022-12-03 ENCOUNTER — Ambulatory Visit: Payer: Medicaid Other | Admitting: Nurse Practitioner

## 2022-12-03 ENCOUNTER — Encounter: Payer: Self-pay | Admitting: Nurse Practitioner

## 2022-12-03 VITALS — BP 132/72 | HR 70 | Temp 97.6°F | Wt 120.4 lb

## 2022-12-03 DIAGNOSIS — J432 Centrilobular emphysema: Secondary | ICD-10-CM | POA: Diagnosis not present

## 2022-12-03 DIAGNOSIS — M797 Fibromyalgia: Secondary | ICD-10-CM

## 2022-12-03 DIAGNOSIS — R8290 Unspecified abnormal findings in urine: Secondary | ICD-10-CM

## 2022-12-03 DIAGNOSIS — R7303 Prediabetes: Secondary | ICD-10-CM

## 2022-12-03 DIAGNOSIS — E782 Mixed hyperlipidemia: Secondary | ICD-10-CM

## 2022-12-03 DIAGNOSIS — F419 Anxiety disorder, unspecified: Secondary | ICD-10-CM

## 2022-12-03 DIAGNOSIS — I700 Atherosclerosis of aorta: Secondary | ICD-10-CM | POA: Diagnosis not present

## 2022-12-03 MED ORDER — PANTOPRAZOLE SODIUM 40 MG PO TBEC: 40.0000 mg | DELAYED_RELEASE_TABLET | Freq: Every day | ORAL | 1 refills | Status: DC | PRN

## 2022-12-03 MED ORDER — MELOXICAM 15 MG PO TABS: 15.0000 mg | ORAL_TABLET | Freq: Every day | ORAL | 1 refills | Status: DC | PRN

## 2022-12-03 NOTE — Assessment & Plan Note (Signed)
Chronic. Ongoing.  Uses Cymbalta  daily.  Increases to  PRN for help with Fibromyalgia pain. Continue with current medication regimen.  Labs ordered today.  Follow up in 3 months.  Call sooner if concerns arise.

## 2022-12-03 NOTE — Assessment & Plan Note (Signed)
Labs ordered at visit today.  Will make recommendations based on lab results.   

## 2022-12-03 NOTE — Assessment & Plan Note (Signed)
Chronic.  Controlled.  Continue with current medication regimen of Repatha.  Return to clinic in 6 months for reevaluation.  Labs ordered today.  Call sooner if concerns arise.

## 2022-12-03 NOTE — Progress Notes (Signed)
BP 132/72   Pulse 70   Temp 97.6 F (36.4 C) (Oral)   Wt 120 lb 6.4 oz (54.6 kg)   SpO2 96%   BMI 18.31 kg/m    Subjective:    Patient ID: Amber Livingston, female    DOB: [birth year], 62 y.o.   MRN: [id number]  HPI: Amber Livingston is a 62 y.o. female  Chief Complaint  Patient presents with   Anxiety   Gastroesophageal Reflux   Hypertension   COPD Continues to have a lot of fatigue with her Fibromyalgia. See's Pulmonology.  On Stiolto COPD status: uncontrolled Satisfied with current treatment?: yes Oxygen use: no Dyspnea frequency: yes Cough frequency: no Rescue inhaler frequency:   Limitation of activity: yes Productive cough: no Last Spirometry:  Pneumovax: Up to Date Influenza: Up to Date  SMOKING  Smoking Status: not smoking Pneumovax: up to date  HYPERLIPIDEMIA Hyperlipidemia status: excellent compliance Satisfied with current treatment?  yes Side effects:  no Medication compliance: excellent compliance Past cholesterol meds: repatha Supplements: none Aspirin:  no The 10-year ASCVD risk score (Arnett DK, et al., 2019) is: 2.3%   Values used to calculate the score:     Age: 27 years     Sex: Female     Is Non-Hispanic African American: No     Diabetic: No     Tobacco smoker: No     Systolic Blood Pressure: 129 mmHg     Is BP treated: No     HDL Cholesterol: 95 mg/dL     Total Cholesterol: 164 mg/dL Chest pain:  no Coronary artery disease:  no Family history CAD:  no Family history early CAD:  no  Patient states her voice is not very loud.  States with the fibromyalgia it has declined over the years.  Feels like she has to yell.  She has been seeing an ENT who has not been able to find a a cause for her voice issues.    MOOD Feels like her mood is doing well with the Cymbalta. Due to not feeling well she has increased her dose of Cymbalta.  She starts out taking Cymbalta  then takes a  dose if increased.  She has been sick and is tired of being  sick.   Patient states she was seen in the ER for nausea, vomiting, pressure in her GI area as well as pelvic pressure.  She took a UTI test at it showed that she had one.  States her genital area had swelled up.  She was very weak and went to the ER.  Her urine did not grow any specific bacteria.  Her symptoms have resolved since being seen in the ER.   Relevant past medical, surgical, family and social history reviewed and updated as indicated. Interim medical history since our last visit reviewed. Allergies and medications reviewed and updated.  Review of Systems  HENT:         Voice concern  Respiratory:  Negative for cough and shortness of breath.   Musculoskeletal:  Positive for myalgias.  Psychiatric/Behavioral:  Positive for dysphoric mood. Negative for suicidal ideas. The patient is nervous/anxious.     Per HPI unless specifically indicated above     Objective:    BP 132/72   Pulse 70   Temp 97.6 F (36.4 C) (Oral)   Wt 120 lb 6.4 oz (54.6 kg)   SpO2 96%   BMI 18.31 kg/m   Wt Readings from Last 3 Encounters:  12/03/22 120  lb 6.4 oz (54.6 kg)  11/30/22 160 lb (72.6 kg)  11/13/22 123 lb 4.8 oz (55.9 kg)    Physical Exam Vitals and nursing note reviewed.  Constitutional:      General: She is not in acute distress.    Appearance: Normal appearance. She is not ill-appearing, toxic-appearing or diaphoretic.  HENT:     Head: Normocephalic.     Right Ear: External ear normal.     Left Ear: External ear normal.     Nose: Nose normal.     Mouth/Throat:     Mouth: Mucous membranes are moist.     Pharynx: Oropharynx is clear.  Eyes:     General:        Right eye: No discharge.        Left eye: No discharge.     Extraocular Movements: Extraocular movements intact.     Conjunctiva/sclera: Conjunctivae normal.     Pupils: Pupils are equal, round, and reactive to light.  Cardiovascular:     Rate and Rhythm: Normal rate and regular rhythm.     Heart sounds: No murmur  heard. Pulmonary:     Effort: Pulmonary effort is normal. No respiratory distress.     Breath sounds: Normal breath sounds. No wheezing or rales.  Musculoskeletal:     Cervical back: Normal range of motion and neck supple.  Skin:    General: Skin is warm and dry.     Capillary Refill: Capillary refill takes less than 2 seconds.  Neurological:     General: No focal deficit present.     Mental Status: She is alert and oriented to person, place, and time. Mental status is at baseline.  Psychiatric:        Mood and Affect: Mood normal.        Behavior: Behavior normal.        Thought Content: Thought content normal.        Judgment: Judgment normal.     Results for orders placed or performed during the hospital encounter of 11/30/22  Urine Culture   Specimen: Urine, Clean Catch  Result Value Ref Range   Specimen Description      URINE, CLEAN CATCH Performed at [facility], [address] [place], [place] [address]    Special Requests      NONE Performed at [facility], [address] [place], [place] [address]    Culture MULTIPLE SPECIES PRESENT, SUGGEST RECOLLECTION (A)    Report Status 12/02/2022 FINAL   Wet prep, genital   Specimen: Urine, Clean Catch  Result Value Ref Range   Yeast Wet Prep HPF POC NONE SEEN NONE SEEN   Trich, Wet Prep NONE SEEN NONE SEEN   Clue Cells Wet Prep HPF POC NONE SEEN NONE SEEN   WBC, Wet Prep HPF POC <10 (A) <10   Sperm NONE SEEN   Chlamydia/NGC rt PCR ([facility] only)   Specimen: Urine, Clean Catch  Result Value Ref Range   Specimen source GC/Chlam ENDOCERVICAL    Chlamydia Tr NOT DETECTED NOT DETECTED   N gonorrhoeae NOT DETECTED NOT DETECTED  Urinalysis, Complete w Microscopic -Urine, Clean Catch  Result Value Ref Range   Color, Urine STRAW (A) YELLOW   APPearance CLEAR (A) CLEAR   Specific Gravity, Urine 1.005 1.005 - 1.030   pH 6.0 5.0 - 8.0   Glucose, UA NEGATIVE NEGATIVE mg/dL   Hgb urine dipstick NEGATIVE  NEGATIVE   Bilirubin Urine NEGATIVE NEGATIVE   Ketones,  ur NEGATIVE NEGATIVE mg/dL   Protein, ur NEGATIVE NEGATIVE mg/dL   Nitrite NEGATIVE NEGATIVE   Leukocytes,Ua NEGATIVE NEGATIVE   RBC / HPF 0-5 0 - 5 RBC/hpf   WBC, UA 0-5 0 - 5 WBC/hpf   Bacteria, UA NONE SEEN NONE SEEN   Squamous Epithelial / HPF 0-5 0 - 5 /HPF      Assessment & Plan:   Problem List Items Addressed This Visit       Cardiovascular and Mediastinum   Aortic atherosclerosis - Primary    Chronic. Does not tolerate statins.  On repatha per cardiology.  Continue with medication regimen. Labs ordered today.  Follow up in 6 months.  Call sooner if concerns arise.         Respiratory   Centrilobular emphysema    Chronic. On Stioloto and [employer] PRN.  Followed by Pulmonology. Continue with CT lung. Continue per Pulmonology's recommendations.  Follow up in 6 months.  Call sooner if concerns arise.       Relevant Orders   CBC w/Diff     Other   Anxiety    Chronic.  Controlled.  Continue with current medication regimen of Cymbalta  daily.  Does increase her dose to  if needed for pain from fibromyalgia.  Can't tolerate it on a regular basis due to the fatigue it causes her.Return to clinic in 6 months for reevaluation.  Call sooner if concerns arise.       Relevant Orders   Comp Met (CMET)   CBC w/Diff   Fibromyalgia    Chronic. Ongoing.  Uses Cymbalta  daily.  Increases to  PRN for help with Fibromyalgia pain. Continue with current medication regimen.  Labs ordered today.  Follow up in 3 months.  Call sooner if concerns arise.       Relevant Medications   meloxicam (MOBIC) 15 MG tablet   Other Relevant Orders   Comp Met (CMET)   CBC w/Diff   Mixed hyperlipidemia    Chronic.  Controlled.  Continue with current medication regimen of Repatha.  Return to clinic in 6 months for reevaluation.  Labs ordered today.  Call sooner if concerns arise.        Relevant Orders   Lipid Profile    Prediabetes    Labs ordered at visit today.  Will make recommendations based on lab results.        Relevant Orders   Comp Met (CMET)   HgB A1c   Other Visit Diagnoses     Abnormal urinalysis       Repeat UA due to multiple organisms growing in her urine from the ER. Will make recommendations based on lab results.   Relevant Orders   Urine Culture        Follow up plan: Return in about 3 months (around 03/04/2023) for Fibromyalgia.

## 2022-12-03 NOTE — Assessment & Plan Note (Signed)
Chronic. On Stioloto and [employer] PRN.  Followed by Pulmonology. Continue with CT lung. Continue per Pulmonology's recommendations.  Follow up in 6 months.  Call sooner if concerns arise.  

## 2022-12-03 NOTE — Assessment & Plan Note (Signed)
Chronic. Does not tolerate statins.  On repatha per cardiology.  Continue with medication regimen. Labs ordered today.  Follow up in 6 months.  Call sooner if concerns arise.

## 2022-12-03 NOTE — Assessment & Plan Note (Signed)
Chronic.  Controlled.  Continue with current medication regimen of Cymbalta  daily.  Does increase her dose to  if needed for pain from fibromyalgia.  Can't tolerate it on a regular basis due to the fatigue it causes her.Return to clinic in 6 months for reevaluation.  Call sooner if concerns arise.

## 2022-12-04 LAB — LIPID PANEL
Chol/HDL Ratio: 2.2 ratio (ref 0.0–4.4)
Cholesterol, Total: 183 mg/dL (ref 100–199)
HDL: 82 mg/dL (ref >39)
LDL Chol Calc (NIH): 83 mg/dL (ref 0–99)
Triglycerides: 100 mg/dL (ref 0–149)
VLDL Cholesterol Cal: 18 mg/dL (ref 5–40)

## 2022-12-04 LAB — COMPREHENSIVE METABOLIC PANEL
ALT: 29 IU/L (ref 0–32)
AST: 29 IU/L (ref 0–40)
Albumin/Globulin Ratio: 2 (ref 1.2–2.2)
Albumin: 4.5 g/dL (ref 3.9–4.9)
Alkaline Phosphatase: 60 IU/L (ref 44–121)
BUN/Creatinine Ratio: 7 — ABNORMAL LOW (ref 12–28)
BUN: 6 mg/dL — ABNORMAL LOW (ref 8–27)
Bilirubin Total: 0.3 mg/dL (ref 0.0–1.2)
CO2: 22 mmol/L (ref 20–29)
Calcium: 9.2 mg/dL (ref 8.7–10.3)
Chloride: 94 mmol/L — ABNORMAL LOW (ref 96–106)
Creatinine, Ser: 0.81 mg/dL (ref 0.57–1.00)
Globulin, Total: 2.2 g/dL (ref 1.5–4.5)
Glucose: 82 mg/dL (ref 70–99)
Potassium: 4.6 mmol/L (ref 3.5–5.2)
Sodium: 130 mmol/L — ABNORMAL LOW (ref 134–144)
Total Protein: 6.7 g/dL (ref 6.0–8.5)
eGFR: 82 mL/min/{1.73_m2} (ref >59)

## 2022-12-04 LAB — CBC WITH DIFFERENTIAL/PLATELET
Basophils Absolute: 0.1 10*3/uL (ref 0.0–0.2)
Basos: 2 %
EOS (ABSOLUTE): 0.1 10*3/uL (ref 0.0–0.4)
Eos: 3 %
Hematocrit: 34.8 % (ref 34.0–46.6)
Hemoglobin: 12.3 g/dL (ref 11.1–15.9)
Immature Grans (Abs): 0 10*3/uL (ref 0.0–0.1)
Immature Granulocytes: 0 %
Lymphocytes Absolute: 0.8 10*3/uL (ref 0.7–3.1)
Lymphs: 21 %
MCH: 32.4 pg (ref 26.6–33.0)
MCHC: 35.3 g/dL (ref 31.5–35.7)
MCV: 92 fL (ref 79–97)
Monocytes Absolute: 0.4 10*3/uL (ref 0.1–0.9)
Monocytes: 10 %
Neutrophils Absolute: 2.5 10*3/uL (ref 1.4–7.0)
Neutrophils: 64 %
Platelets: 303 10*3/uL (ref 150–450)
RBC: 3.8 x10E6/uL (ref 3.77–5.28)
RDW: 12 % (ref 11.7–15.4)
WBC: 3.9 10*3/uL (ref 3.4–10.8)

## 2022-12-04 LAB — HEMOGLOBIN A1C
Est. average glucose Bld gHb Est-mCnc: 105 mg/dL
Hgb A1c MFr Bld: 5.3 % (ref 4.8–5.6)

## 2022-12-04 NOTE — Progress Notes (Signed)
Hi Kelechi. It was nice to see you yesterday.  Your lab work looks good.  Your sodium and chloride are a little low but it might be due to your recent diet changes.  We will keep an eye on this in the future.  Your A1c is in the normal range which is great.  No concerns at this time. Continue with your current medication regimen.  Follow up as discussed.  Please let me know if you have any questions.

## 2022-12-05 LAB — URINE CULTURE

## 2022-12-05 NOTE — Progress Notes (Signed)
Hi Amber Livingston. There was no growth on your urine culture meaning you don't need antibiotics.

## 2022-12-09 ENCOUNTER — Ambulatory Visit: Payer: Self-pay | Admitting: *Deleted

## 2022-12-09 NOTE — Telephone Encounter (Signed)
  Chief Complaint: Has thrush in her esophagus.   Requesting an antifungal lozenge medication be called in for this to the [facility] in [place].    "Clydie Braun will know what I'm talking about"      "I just saw her last week".    "I have all the symptoms of thrush in my esophagus". Symptoms: above Frequency:  Pertinent Negatives: Patient denies  Disposition: ED /[] Urgent Care (no appt availability in office) / Appointment(In office/virtual)/  [facility]/ Home Care/ Refused Recommended Disposition /[] [facility]/  Follow-up with PCP Additional Notes: She did not want an appt. Since she was just seen last week.    She is requesting the medication for the esophageal thrush to be called in.  "Clydie Braun will know what I'm talking about"  Message sent to Larae Grooms, NP

## 2022-12-09 NOTE — Telephone Encounter (Signed)
Answer Assessment - Initial Assessment Questions 1. VOMITING SEVERITY: "How many times have you vomited in the past 24 hours?"     - MILD:  1 - 2 times/day    - MODERATE: 3 - 5 times/day, decreased oral intake without significant weight loss or symptoms of dehydration    - SEVERE: 6 or more times/day, vomits everything or nearly everything, with significant weight loss, symptoms of dehydration    I'm not vomiting now.   That was 2 weeks ago while I was killing the biofilm in my gut.   I didn't drink enough water.   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     I have thrush in my esophagus now.    I just saw Clydie Braun last week.   I've been doing research and Clydie Braun will know what I'm talking about.  They say to treat the thrush in my esophagus with an antifungal medicine that is a lozenge.    Would she be willing to call this in to the [facility] in [place]?    She will know all about what I'm talking about.  2. ONSET: "When did the vomiting begin?"      I was vomiting 2 weeks when I was killing the biofilm.   I'm going to start killing it again.   I have fibromyalgia too.   The thrush in my esophagus is my problem now. I had dental work done which can cause this.     The GI is so backed up that I don't know when I'll get to have the scope procedure done. No further triage  needed at this point.  3. FLUIDS: "What fluids or food have you vomited up today?" "Have you been able to keep any fluids down?"      4. ABDOMEN PAIN: "Are your having any abdomen pain?" If Yes : "How bad is it and what does it feel like?" (e.g., crampy, dull, intermittent, constant)       5. DIARRHEA: "Is there any diarrhea?" If Yes, ask: "How many times today?"       6. CONTACTS: "Is there anyone else in the family with the same symptoms?"       7. CAUSE: "What do you think is causing your vomiting?"      8. HYDRATION STATUS: "Any signs of dehydration?" (e.g., dry mouth [not only dry lips], too weak to stand)  "When did you last urinate?"      9. OTHER SYMPTOMS: "Do you have any other symptoms?" (e.g., fever, headache, vertigo, vomiting blood or coffee grounds, recent head injury)      10. PREGNANCY: "Is there any chance you are pregnant?" "When was your last menstrual period?"  Protocols used: Vomiting-A-AH

## 2022-12-09 NOTE — Telephone Encounter (Signed)
Called and scheduled patient with another provider for tomorrow at 2:40 pm.

## 2022-12-09 NOTE — Telephone Encounter (Signed)
Patient will need an appt. Okay to see Amber Livingston tomorrow.

## 2022-12-10 ENCOUNTER — Encounter: Payer: Self-pay | Admitting: Family Medicine

## 2022-12-10 ENCOUNTER — Ambulatory Visit: Payer: Medicaid Other | Admitting: Family Medicine

## 2022-12-10 VITALS — BP 131/74 | HR 78 | Temp 98.1°F | Wt 122.8 lb

## 2022-12-10 DIAGNOSIS — B370 Candidal stomatitis: Secondary | ICD-10-CM

## 2022-12-10 MED ORDER — FLUCONAZOLE 150 MG PO TABS: 150.0000 mg | ORAL_TABLET | Freq: Every day | ORAL | 0 refills | Status: AC

## 2022-12-10 MED ORDER — FLUCONAZOLE 150 MG PO TABS: 150.0000 mg | ORAL_TABLET | Freq: Once | ORAL | 0 refills | Status: DC

## 2022-12-10 NOTE — Progress Notes (Signed)
BP 131/74   Pulse 78   Temp 98.1 F (36.7 C) (Oral)   Wt 122 lb 12.8 oz (55.7 kg)   SpO2 97%   BMI 18.67 kg/m    Subjective:    Patient ID: Amber Livingston, female    DOB: 28-Dec-1960, 62 y.o.   MRN: [id number]  HPI: Amber Livingston is a 62 y.o. female  Chief Complaint  Patient presents with   Thrush    Pt states she thinks she has thrush in her throat from using her inhalers and right after getting her dentures.     Patient is using Pulmicort, Albuterol, and Stiolto as inhalers. She has a history of oral issues causing her to wear dentures now. She started wearing her dentures in June 2023, and they do not fit properly. She is complaining of voice hoarseness, occasional vomiting, constant nausea, decreased appetite, difficulty swallowing, loss of taste, dry mouth, and mouth pain. 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 She has an appointment scheduled with gastro in 2 weeks. She is taking Pantoprazole for her GI symptoms. She is complaining of oral candida growing in her mouth and throat today and sates this is her worst complaint.    Relevant past medical, surgical, family and social history reviewed and updated as indicated. Interim medical history since our last visit reviewed. Allergies and medications reviewed and updated.  Review of Systems  Constitutional:  Positive for appetite change. Negative for fatigue and fever.  HENT:         Candida throughout mouth, hoarseness in voice, dry mouth, mouth pain  Respiratory: Negative.    Cardiovascular: Negative.   Gastrointestinal:  Positive for nausea and vomiting. Negative for abdominal distention, abdominal pain, anal bleeding, blood in stool, constipation and diarrhea.  Endocrine: Positive for polyphagia.    Per HPI unless specifically indicated above     Objective:    BP 131/74   Pulse 78   Temp 98.1 F (36.7 C) (Oral)   Wt 122 lb 12.8 oz (55.7 kg)   SpO2 97%   BMI 18.67 kg/m    Wt Readings from Last 3 Encounters:  12/10/22 122 lb 12.8 oz (55.7 kg)  12/03/22 120 lb 6.4 oz (54.6 kg)  11/30/22 160 lb (72.6 kg)    Physical Exam Vitals and nursing note reviewed.  Constitutional:      General: She is awake. She is not in acute distress.    Appearance: Normal appearance. She is well-developed and well-groomed. She is not ill-appearing.  HENT:     Head: Normocephalic and atraumatic.     Right Ear: Hearing and external ear normal. No drainage.     Left Ear: Hearing and external ear normal. No drainage.     Nose: Nose normal.     Mouth/Throat:     Mouth: Mucous membranes are moist.     Dentition: Has dentures.     Pharynx: Posterior oropharyngeal erythema present.     Comments: Increased saliva throughout mouth Eyes:     General: Lids are normal.        Right eye: No discharge.        Left eye: No discharge.     Conjunctiva/sclera: Conjunctivae normal.  Cardiovascular:     Rate and Rhythm: Normal rate and regular rhythm.     Heart sounds: Normal heart sounds, S1 normal and S2 normal. No murmur heard.    No gallop.  Pulmonary:  Effort: Pulmonary effort is normal. No accessory muscle usage or respiratory distress.     Breath sounds: Normal breath sounds.  Musculoskeletal:        General: Normal range of motion.     Cervical back: Full passive range of motion without pain and normal range of motion.     Right lower leg: No edema.     Left lower leg: No edema.  Skin:    General: Skin is warm and dry.     Capillary Refill: Capillary refill takes less than 2 seconds.  Neurological:     Mental Status: She is alert and oriented to person, place, and time.  Psychiatric:        Attention and Perception: Attention normal.        Mood and Affect: Mood is elated.        Speech: Speech normal.        Behavior: Behavior normal. Behavior is cooperative.        Thought Content: Thought content normal.     Results for orders placed or performed in visit on  12/03/22  Urine Culture   Specimen: Urine   UR  Result Value Ref Range   Urine Culture, Routine Final report    Organism ID, Bacteria Comment   Comp Met (CMET)  Result Value Ref Range   Glucose 82 70 - 99 mg/dL   BUN 6 (L) 8 - 27 mg/dL   Creatinine, Ser 8.29 0.57 - 1.00 mg/dL   eGFR 82 >56 OZ/HYQ/6.57   BUN/Creatinine Ratio 7 (L) 12 - 28   Sodium 130 (L) 134 - 144 mmol/L   Potassium 4.6 3.5 - 5.2 mmol/L   Chloride 94 (L) 96 - 106 mmol/L   CO2 22 20 - 29 mmol/L   Calcium 9.2 8.7 - 10.3 mg/dL   Total Protein 6.7 6.0 - 8.5 g/dL   Albumin 4.5 3.9 - 4.9 g/dL   Globulin, Total 2.2 1.5 - 4.5 g/dL   Albumin/Globulin Ratio 2.0 1.2 - 2.2   Bilirubin Total 0.3 0.0 - 1.2 mg/dL   Alkaline Phosphatase 60 44 - 121 IU/L   AST 29 0 - 40 IU/L   ALT 29 0 - 32 IU/L  Lipid Profile  Result Value Ref Range   Cholesterol, Total 183 100 - 199 mg/dL   Triglycerides 846 0 - 149 mg/dL   HDL 82 >96 mg/dL   VLDL Cholesterol Cal 18 5 - 40 mg/dL   LDL Chol Calc (NIH) 83 0 - 99 mg/dL   Chol/HDL Ratio 2.2 0.0 - 4.4 ratio  HgB A1c  Result Value Ref Range   Hgb A1c MFr Bld 5.3 4.8 - 5.6 %   Est. average glucose Bld gHb Est-mCnc 105 mg/dL  CBC w/Diff  Result Value Ref Range   WBC 3.9 3.4 - 10.8 x10E3/uL   RBC 3.80 3.77 - 5.28 x10E6/uL   Hemoglobin 12.3 11.1 - 15.9 g/dL   Hematocrit 29.5 28.4 - 46.6 %   MCV 92 79 - 97 fL   MCH 32.4 26.6 - 33.0 pg   MCHC 35.3 31.5 - 35.7 g/dL   RDW 13.2 44.0 - 10.2 %   Platelets 303 150 - 450 x10E3/uL   Neutrophils 64 Not Estab. %   Lymphs 21 Not Estab. %   Monocytes 10 Not Estab. %   Eos 3 Not Estab. %   Basos 2 Not Estab. %   Neutrophils Absolute 2.5 1.4 - 7.0 x10E3/uL   Lymphocytes Absolute 0.8 0.7 -  3.1 x10E3/uL   Monocytes Absolute 0.4 0.1 - 0.9 x10E3/uL   EOS (ABSOLUTE) 0.1 0.0 - 0.4 x10E3/uL   Basophils Absolute 0.1 0.0 - 0.2 x10E3/uL   Immature Granulocytes 0 Not Estab. %   Immature Grans (Abs) 0.0 0.0 - 0.1 x10E3/uL      Assessment & Plan:    Problem List Items Addressed This Visit   None Visit Diagnoses     Oral candida    -  Primary   Acute, ongoing. 150mg  PO Fluconazole given for 7 days. Has GI appt for 5/15. Recommend to follow up with dentist. F/u if symptoms worsen or do not improve.   Relevant Medications   fluconazole (DIFLUCAN) 150 MG tablet        Follow up plan: Return in about 3 months (around 03/06/2023) for Follow up.

## 2022-12-16 ENCOUNTER — Ambulatory Visit: Payer: Self-pay

## 2022-12-16 ENCOUNTER — Encounter: Payer: Self-pay | Admitting: Gastroenterology

## 2022-12-16 ENCOUNTER — Telehealth (INDEPENDENT_AMBULATORY_CARE_PROVIDER_SITE_OTHER): Payer: Medicaid Other | Admitting: Gastroenterology

## 2022-12-16 VITALS — Wt 122.0 lb

## 2022-12-16 DIAGNOSIS — Z86010 Personal history of colonic polyps: Secondary | ICD-10-CM | POA: Diagnosis not present

## 2022-12-16 DIAGNOSIS — K219 Gastro-esophageal reflux disease without esophagitis: Secondary | ICD-10-CM

## 2022-12-16 DIAGNOSIS — R194 Change in bowel habit: Secondary | ICD-10-CM | POA: Diagnosis not present
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
## 2022-12-16 NOTE — Telephone Encounter (Signed)
Prescription was changed from one time to a 7 day prescription.  She should complete 7 days of Diflucan.

## 2022-12-16 NOTE — Telephone Encounter (Signed)
Please advise. Patient was seen by Rashelle last week, she is currently out of the office.

## 2022-12-16 NOTE — Telephone Encounter (Signed)
Called and notified patient about medication.

## 2022-12-16 NOTE — Telephone Encounter (Signed)
     Chief Complaint: Pt. Angry, states she was told 12/10/22 to only take 1 Diflucan by mouth. Instructed her that the prescription sent in is for 1 daily x 7 days. States "I don't care, that's not what she told me to take. Someone needs to straighten this out." Requests a call back. Symptoms: n/a Frequency: n/a Pertinent Negatives: Patient denies  Disposition: [] ED /[] Urgent Care (no appt availability in office) / [] Appointment(In office/virtual)/ []  [facility]/ [] Home Care/ [] Refused Recommended Disposition /[] [facility]/ [x]  Follow-up with PCP Additional Notes: Repeated to pt. The [facility], [facility].  Answer Assessment - Initial Assessment Questions 1. NAME of MEDICINE: "What medicine(s) are you calling about?"     Diflucan 2. QUESTION: "What is your question?" (e.g., double dose of medicine, side effect)     States she was told 12/10/22 to only take 1 Difucan. 3. PRESCRIBER: "Who prescribed the medicine?" Reason: if prescribed by specialist, call should be referred to that group.     Pearly 4. SYMPTOMS: "Do you have any symptoms?" If Yes, ask: "What symptoms are you having?"  "How bad are the symptoms (e.g., mild, moderate, severe)     N/a 5. PREGNANCY:  "Is there any chance that you are pregnant?" "When was your last menstrual period?"     No  Protocols used: Medication Question Call-A-AH

## 2022-12-17 ENCOUNTER — Telehealth: Payer: Self-pay | Admitting: Gastroenterology

## 2022-12-17 NOTE — Telephone Encounter (Signed)
Pt left message waiting on call back with more info from appointment please return call

## 2022-12-18 NOTE — Telephone Encounter (Signed)
Pt called back today. Very anxious to get scheduled. Pt stated Monday, May 6th would work for her. Please contact pt to discuss.

## 2022-12-19 ENCOUNTER — Encounter: Payer: Self-pay | Admitting: *Deleted

## 2022-12-19 ENCOUNTER — Telehealth: Payer: Self-pay

## 2022-12-19 ENCOUNTER — Telehealth: Payer: Self-pay | Admitting: *Deleted

## 2022-12-19 MED ORDER — NA SULFATE-K SULFATE-MG SULF 17.5-3.13-1.6 GM/177ML PO SOLN: 1.0000 | Freq: Once | ORAL | 0 refills | Status: AC

## 2022-12-19 NOTE — Telephone Encounter (Signed)
   Pre-operative Risk Assessment    Patient Name: Amber Livingston  DOB: 05/04/1961 MRN: [id number]      Request for Surgical Clearance    Procedure:   COLONOSCOPY AND EGD  Date of Surgery:  Clearance 02/04/23                                 Surgeon:  DR. DARREN WOHL Surgeon's Group or Practice Name:  [facility] Phone number:  [phone] Fax number:  [phone] ATTN: MELANIE   Type of Clearance Requested:  NO MEDICATIONS LISTED AS NEEDING TO BE HELD - Medical    Type of Anesthesia:   PROPOFOL  /GENERAL   Additional requests/questions:    Amber Livingston   12/19/2022, 12:38 PM         --- Message from Amber Asters, NP sent at 12/19/2022 11:13 AM EDT ----- Regarding: FW: Pre op clearance Please form and add to preoperative pool.  Thank you for your help.   Amber Ripple. Cleaver NP-C   [image]   12/19/2022, 11:13 AM [facility] [address] Office [phone] Fax [phone]   ----- Message ----- From: Amber Livingston, CMA Sent: 12/19/2022  10:04 AM EDT To: Amber Livingston Subject: Pre op clearance                                12/19/2022   Patient Name: Amber Livingston   DOB: [birth year]     Amber Livingston has been scheduled for a colonoscopy/EGD on February 04, 2023, under GENERAL anesthesia.   Please FAX a note of Medical Clearance to [phone], ATTENTION: MELANIE     Thank you    [facility]     _____  Patient is cleared to have procedure   _____  Patient is cleared to have procedure and should stop taking               __________________________ Medication _____ days prior               and _____ restart.   Additional notes:_________________________________________________________   ______________________________________________________________________     _______________________________________     ________________ Physician Signature

## 2022-12-19 NOTE — Addendum Note (Signed)
Addended by: Roena Malady on: 12/19/2022 10:07 AM   Modules accepted: Orders

## 2022-12-19 NOTE — Telephone Encounter (Signed)
Clearance faxed to cardiology and pulmonology x 2

## 2022-12-19 NOTE — Telephone Encounter (Signed)
   Name: Amber Livingston  DOB: 1961-03-31  MRN: [id number]  Primary Cardiologist: Christell Constant, MD   Preoperative team, please contact this patient and set up a phone call appointment for further preoperative risk assessment. Please obtain consent and complete medication review. Thank you for your help.  I confirm that guidance regarding antiplatelet and oral anticoagulation therapy has been completed and, if necessary, noted below.  None requested.   Ronney Asters, NP 12/19/2022, 12:43 PM [facility]

## 2022-12-19 NOTE — Telephone Encounter (Signed)
   Pre-operative Risk Assessment    Patient Name: Amber Livingston  DOB: Aug 23, 1960 MRN: [id number]      Request for Surgical Clearance    Procedure:   LEFT QUADRICEP REPAIR  Date of Surgery:  Clearance 12/25/22                                 Surgeon:  DR. Lauretta Chester Surgeon's Group or Practice Name:  [facility] Phone number:  [phone] Fax number:  [phone]   Type of Clearance Requested:   - Medical  - Pharmacy:  Hold Clopidogrel (Plavix) AS OF TODAY 12/19/22 FOR SURGERY 12/25/22   Type of Anesthesia:  General WITH BLOCK   Additional requests/questions:    Elpidio Anis   12/19/2022, 11:59 AM

## 2022-12-19 NOTE — Telephone Encounter (Signed)
I left a message for the pt to call back to schedule tele appt. After I hung up the phone I realized that I made an error and her procedure is not until 02/04/23 for colon/EGD with [facility].    I am going to do an erroneous encounter for this note at this time. I will place a correct note in the chart. I will call the pt back and give my apologies for my error.   This encounter was created in error - please disregard.

## 2022-12-19 NOTE — Telephone Encounter (Signed)
-----   Message from Ronney Asters, NP sent at 12/19/2022 11:13 AM EDT ----- Regarding: FW: Pre op clearance Please form and add to preoperative pool.  Thank you for your help.  Thomasene Ripple. Cleaver NP-C  [image]   12/19/2022, 11:13 AM [facility] [address] Office [phone] Fax [phone]  ----- Message ----- From: Roena Malady, CMA Sent: 12/19/2022  10:04 AM EDT To: [facility] Pre[facility] Subject: Pre op clearance                               12/19/2022  Patient Name: Amber Livingston  DOB: [birth year]   Ms. Eanes has been scheduled for a colonoscopy/EGD on February 04, 2023, under GENERAL anesthesia.  Please FAX a note of Medical Clearance to [phone], ATTENTION: MELANIE   Thank you   [facility]   _____ Patient is cleared to have procedure  _____ Patient is cleared to have procedure and should stop taking   __________________________ Medication _____ days prior   and _____ restart.  Additional notes:_________________________________________________________  ______________________________________________________________________   _______________________________________ ________________ Physician Signature     Date

## 2022-12-19 NOTE — Telephone Encounter (Signed)
   Name: Amber Livingston  DOB: [birth year]  MRN: [id number]  Primary Cardiologist: Christell Constant, MD   Preoperative team, please contact this patient and set up a phone call appointment for further preoperative risk assessment. Please obtain consent and complete medication review. Thank you for your help.  I confirm that guidance regarding antiplatelet and oral anticoagulation therapy has been completed and, if necessary, noted below.  Preoperative request asks for Plavix hold.  However, Plavix is not listed on patient's medication list.  Cardiology does not prescribe Plavix for patient.  Recommendations for holding Plavix will need to come from prescribing provider.   Ronney Asters, NP 12/19/2022, 12:23 PM [facility]

## 2022-12-20 ENCOUNTER — Telehealth: Payer: Self-pay | Admitting: *Deleted

## 2022-12-20 ENCOUNTER — Telehealth: Payer: Self-pay | Admitting: Pulmonary Disease

## 2022-12-20 NOTE — Telephone Encounter (Signed)
I s/w the pt and she has been scheduled for tele pre op appt 01/20/23 @ 9 am. Med rec and consent are done.

## 2022-12-20 NOTE — Telephone Encounter (Signed)
I s/w the pt and she has been scheduled for tele pre op appt 01/20/23 @ 9 am. Med rec and consent are done.     Patient Consent for Virtual Visit        Amber Livingston has provided verbal consent on 12/20/2022 for a virtual visit (video or telephone).   CONSENT FOR VIRTUAL VISIT FOR:  Amber Livingston  By participating in this virtual visit I agree to the following:  I hereby voluntarily request, consent and authorize [facility] and its employed or contracted physicians, physician assistants, nurse practitioners or other licensed health care professionals (the Practitioner), to provide me with telemedicine health care services (the "Services") as deemed necessary by the treating Practitioner. I acknowledge and consent to receive the Services by the Practitioner via telemedicine. I understand that the telemedicine visit will involve communicating with the Practitioner through live audiovisual communication technology and the disclosure of certain medical information by electronic transmission. I acknowledge that I have been given the opportunity to request an in-person assessment or other available alternative prior to the telemedicine visit and am voluntarily participating in the telemedicine visit.  I understand that I have the right to withhold or withdraw my consent to the use of telemedicine in the course of my care at any time, without affecting my right to future care or treatment, and that the Practitioner or I may terminate the telemedicine visit at any time. I understand that I have the right to inspect all information obtained and/or recorded in the course of the telemedicine visit and may receive copies of available information for a reasonable fee.  I understand that some of the potential risks of receiving the Services via telemedicine include:  Delay or interruption in medical evaluation due to technological equipment failure or disruption; Information transmitted may not be sufficient  (e.g. poor resolution of images) to allow for appropriate medical decision making by the Practitioner; and/or  In rare instances, security protocols could fail, causing a breach of personal health information.  Furthermore, I acknowledge that it is my responsibility to provide information about my medical history, conditions and care that is complete and accurate to the best of my ability. I acknowledge that Practitioner's advice, recommendations, and/or decision may be based on factors not within their control, such as incomplete or inaccurate data provided by me or distortions of diagnostic images or specimens that may result from electronic transmissions. I understand that the practice of medicine is not an exact science and that Practitioner makes no warranties or guarantees regarding treatment outcomes. I acknowledge that a copy of this consent can be made available to me via my patient portal [facility] MyChart), or I can request a printed copy by calling the office of [facility].    I understand that my insurance will be billed for this visit.   I have read or had this consent read to me. I understand the contents of this consent, which adequately explains the benefits and risks of the Services being provided via telemedicine.  I have been provided ample opportunity to ask questions regarding this consent and the Services and have had my questions answered to my satisfaction. I give my informed consent for the services to be provided through the use of telemedicine in my medical care

## 2022-12-20 NOTE — Telephone Encounter (Signed)
Spoke with patient. Advised risk assessment for upcoming procedure would be completed at next apt. Patient verbalized understanding. NFN  LEAVE ENCOUNTER OPEN!!!

## 2022-12-20 NOTE — Telephone Encounter (Signed)
Patient will be assessed for pulmonary clearance at next scheduled appointment. Please contact her to let her know she will be evaluated for risk assessment for procedure at that time. If she wishes for sooner appointment, she can be scheduled in my next available slot at [facility] or with NP at [facility] location.

## 2022-12-20 NOTE — Telephone Encounter (Signed)
Fax received from Dr. [name] with [facility] to perform a Colonoscopy/EGD on patient.  Patient needs surgery clearance. Surgery is 02/04/23. Patient was seen on 10/28/22. Office protocol is a risk assessment can be sent to surgeon if patient has been seen in 60 days or less.   Sending to Dr. Everardo All for risk assessment or recommendations if patient needs to be seen in office prior to surgical procedure.     Do not close encounter

## 2022-12-23 ENCOUNTER — Telehealth: Payer: Self-pay | Admitting: Nurse Practitioner

## 2022-12-23 NOTE — Telephone Encounter (Signed)
It looks like this is what you saw her for.

## 2022-12-23 NOTE — Telephone Encounter (Signed)
Fluconazole 100mg  not on current medication list, patient stated she need more medication as she think she still has the Thrash in her body.

## 2022-12-23 NOTE — Telephone Encounter (Signed)
Medication Refill - Medication: Fluconazole 100mg   Has the patient contacted their pharmacy? No.  Preferred Pharmacy (with phone number or street name):  [facility] [phone] [place], [place] - [address] Phone: [phone]  Fax: [phone]     Has the patient been seen for an appointment in the last year OR does the patient have an upcoming appointment? Yes.    Agent: Please be advised that RX refills may take up to 3 business days. We ask that you follow-up with your pharmacy.  Patient stated she need more medication as she think she still has the Thrash in her body

## 2022-12-24 NOTE — Telephone Encounter (Signed)
Please call and schedule the patient an appointment for re-evaluation per Rashelle.

## 2022-12-24 NOTE — Telephone Encounter (Signed)
Pt has an appointment for 12/25/2022.

## 2022-12-25 ENCOUNTER — Ambulatory Visit: Payer: Medicaid Other | Admitting: Nurse Practitioner

## 2022-12-25 ENCOUNTER — Encounter: Payer: Self-pay | Admitting: Nurse Practitioner

## 2022-12-25 VITALS — BP 123/76 | HR 66 | Temp 97.9°F | Wt 116.4 lb

## 2022-12-25 DIAGNOSIS — B370 Candidal stomatitis: Secondary | ICD-10-CM

## 2022-12-25 MED ORDER — FLUCONAZOLE 150 MG PO TABS: 150.0000 mg | ORAL_TABLET | Freq: Every day | ORAL | 0 refills | Status: DC

## 2022-12-25 NOTE — Progress Notes (Signed)
BP 123/76   Pulse 66   Temp 97.9 F (36.6 C) (Oral)   Wt 116 lb 6.4 oz (52.8 kg)   BMI 17.70 kg/m    Subjective:    Patient ID: Amber Livingston, female    DOB: [birth year], 62 y.o.   MRN: [id number]  HPI: Amber Livingston is a 62 y.o. female  Chief Complaint  Patient presents with   Amber Livingston    Patient states that she was seen after the last visit on 12/10/22.  She had dentures but in and sores from the dentures.  She felt like her symptoms had improved after the first round of thrush.   She states the thrush is in her esophagus and down.  She feels like this was caused by the prednisone and inhalers are what caused her symptoms.  She wants to make sure she isn't septic and have a CBC drawn. States her skin improved with the Diflucan.     12/10/22-Patient is using Pulmicort, Albuterol, and Stiolto as inhalers. She has a history of oral issues causing her to wear dentures now. She started wearing her dentures in June 2023, and they do not fit properly. She is complaining of voice hoarseness, occasional vomiting, constant nausea, decreased appetite, difficulty swallowing, loss of taste, dry mouth, and mouth pain. 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 She has an appointment scheduled with gastro in 2 weeks. She is taking Pantoprazole for her GI symptoms. She is complaining of oral candida growing in her mouth and throat today and sates this is her worst complaint.    Relevant past medical, surgical, family and social history reviewed and updated as indicated. Interim medical history since our last visit reviewed. Allergies and medications reviewed and updated.  Review of Systems  HENT:         Thrush in her esophagus    Per HPI unless specifically indicated above     Objective:    BP 123/76   Pulse 66   Temp 97.9 F (36.6 C) (Oral)   Wt 116 lb 6.4 oz (52.8 kg)   BMI 17.70 kg/m   Wt Readings from Last 3 Encounters:  12/25/22 116 lb 6.4 oz  (52.8 kg)  12/16/22 122 lb (55.3 kg)  12/10/22 122 lb 12.8 oz (55.7 kg)    Physical Exam Vitals and nursing note reviewed.  Constitutional:      General: She is not in acute distress.    Appearance: Normal appearance. She is normal weight. She is not ill-appearing, toxic-appearing or diaphoretic.  HENT:     Head: Normocephalic.     Right Ear: External ear normal.     Left Ear: External ear normal.     Nose: Nose normal.     Mouth/Throat:     Mouth: Mucous membranes are moist.     Pharynx: Oropharynx is clear.  Eyes:     General:        Right eye: No discharge.        Left eye: No discharge.     Extraocular Movements: Extraocular movements intact.     Conjunctiva/sclera: Conjunctivae normal.     Pupils: Pupils are equal, round, and reactive to light.  Cardiovascular:     Rate and Rhythm: Normal rate and regular rhythm.     Heart sounds: No murmur heard. Pulmonary:     Effort: Pulmonary effort is normal. No respiratory distress.     Breath sounds: Normal breath  sounds. No wheezing or rales.  Musculoskeletal:     Cervical back: Normal range of motion and neck supple.  Skin:    General: Skin is warm and dry.     Capillary Refill: Capillary refill takes less than 2 seconds.  Neurological:     General: No focal deficit present.     Mental Status: She is alert and oriented to person, place, and time. Mental status is at baseline.  Psychiatric:        Mood and Affect: Mood normal.        Behavior: Behavior normal.        Thought Content: Thought content normal.        Judgment: Judgment normal.     Results for orders placed or performed in visit on 12/03/22  Urine Culture   Specimen: Urine   UR  Result Value Ref Range   Urine Culture, Routine Final report    Organism ID, Bacteria Comment   Comp Met (CMET)  Result Value Ref Range   Glucose 82 70 - 99 mg/dL   BUN 6 (L) 8 - 27 mg/dL   Creatinine, Ser 1.61 0.57 - 1.00 mg/dL   eGFR 82 >09 UE/AVW/0.98   BUN/Creatinine  Ratio 7 (L) 12 - 28   Sodium 130 (L) 134 - 144 mmol/L   Potassium 4.6 3.5 - 5.2 mmol/L   Chloride 94 (L) 96 - 106 mmol/L   CO2 22 20 - 29 mmol/L   Calcium 9.2 8.7 - 10.3 mg/dL   Total Protein 6.7 6.0 - 8.5 g/dL   Albumin 4.5 3.9 - 4.9 g/dL   Globulin, Total 2.2 1.5 - 4.5 g/dL   Albumin/Globulin Ratio 2.0 1.2 - 2.2   Bilirubin Total 0.3 0.0 - 1.2 mg/dL   Alkaline Phosphatase 60 44 - 121 IU/L   AST 29 0 - 40 IU/L   ALT 29 0 - 32 IU/L  Lipid Profile  Result Value Ref Range   Cholesterol, Total 183 100 - 199 mg/dL   Triglycerides 119 0 - 149 mg/dL   HDL 82 >14 mg/dL   VLDL Cholesterol Cal 18 5 - 40 mg/dL   LDL Chol Calc (NIH) 83 0 - 99 mg/dL   Chol/HDL Ratio 2.2 0.0 - 4.4 ratio  HgB A1c  Result Value Ref Range   Hgb A1c MFr Bld 5.3 4.8 - 5.6 %   Est. average glucose Bld gHb Est-mCnc 105 mg/dL  CBC w/Diff  Result Value Ref Range   WBC 3.9 3.4 - 10.8 x10E3/uL   RBC 3.80 3.77 - 5.28 x10E6/uL   Hemoglobin 12.3 11.1 - 15.9 g/dL   Hematocrit 78.2 95.6 - 46.6 %   MCV 92 79 - 97 fL   MCH 32.4 26.6 - 33.0 pg   MCHC 35.3 31.5 - 35.7 g/dL   RDW 21.3 08.6 - 57.8 %   Platelets 303 150 - 450 x10E3/uL   Neutrophils 64 Not Estab. %   Lymphs 21 Not Estab. %   Monocytes 10 Not Estab. %   Eos 3 Not Estab. %   Basos 2 Not Estab. %   Neutrophils Absolute 2.5 1.4 - 7.0 x10E3/uL   Lymphocytes Absolute 0.8 0.7 - 3.1 x10E3/uL   Monocytes Absolute 0.4 0.1 - 0.9 x10E3/uL   EOS (ABSOLUTE) 0.1 0.0 - 0.4 x10E3/uL   Basophils Absolute 0.1 0.0 - 0.2 x10E3/uL   Immature Granulocytes 0 Not Estab. %   Immature Grans (Abs) 0.0 0.0 - 0.1 x10E3/uL  Assessment & Plan:   Problem List Items Addressed This Visit   None Visit Diagnoses     Oral candida    -  Primary   Will treat with Difulcan for 7 days.  Not able to send in more diflucan.  Will check CBC at patient's request.  Recommend keeping appt with GI.   Relevant Medications   fluconazole (DIFLUCAN) 150 MG tablet   Other Relevant Orders    CBC w/Diff        Follow up plan: Return if symptoms worsen or fail to improve.

## 2022-12-26 LAB — CBC WITH DIFFERENTIAL/PLATELET
Basophils Absolute: 0.1 10*3/uL (ref 0.0–0.2)
Basos: 1 %
EOS (ABSOLUTE): 0.2 10*3/uL (ref 0.0–0.4)
Eos: 5 %
Hematocrit: 36.2 % (ref 34.0–46.6)
Hemoglobin: 12.2 g/dL (ref 11.1–15.9)
Immature Grans (Abs): 0 10*3/uL (ref 0.0–0.1)
Immature Granulocytes: 0 %
Lymphocytes Absolute: 1.2 10*3/uL (ref 0.7–3.1)
Lymphs: 32 %
MCH: 31.6 pg (ref 26.6–33.0)
MCHC: 33.7 g/dL (ref 31.5–35.7)
MCV: 94 fL (ref 79–97)
Monocytes Absolute: 0.4 10*3/uL (ref 0.1–0.9)
Monocytes: 9 %
Neutrophils Absolute: 2 10*3/uL (ref 1.4–7.0)
Neutrophils: 53 %
Platelets: 318 10*3/uL (ref 150–450)
RBC: 3.86 x10E6/uL (ref 3.77–5.28)
RDW: 12.2 % (ref 11.7–15.4)
WBC: 3.9 10*3/uL (ref 3.4–10.8)

## 2022-12-26 NOTE — Progress Notes (Signed)
Hi Amber Livingston.  Your lab work looks good.  No evidence of sepsis.  I hope you feel better.

## 2022-12-30 ENCOUNTER — Other Ambulatory Visit (HOSPITAL_COMMUNITY): Payer: Self-pay

## 2022-12-30 ENCOUNTER — Telehealth: Payer: Self-pay

## 2022-12-30 NOTE — Telephone Encounter (Signed)
Pharmacy Patient Advocate Encounter   Received notification from [facility] that prior authorization for REPATHA 140MG /ML is required/requested.   PA submitted on 5.13.24 to (ins)  [facility] via [employer] or ([place]) confirmation # [id number]  Status is pending

## 2022-12-31 ENCOUNTER — Other Ambulatory Visit: Payer: Self-pay | Admitting: Psychiatry

## 2022-12-31 ENCOUNTER — Other Ambulatory Visit: Payer: Self-pay

## 2022-12-31 NOTE — Telephone Encounter (Signed)
PA has been DENIED, denial letter has been attached in patients media.

## 2023-01-01 ENCOUNTER — Telehealth: Payer: Medicaid Other | Admitting: Gastroenterology

## 2023-01-01 ENCOUNTER — Telehealth: Payer: Self-pay

## 2023-01-01 NOTE — Telephone Encounter (Signed)
   [facility] Pre-operative Risk Assessment    Request for surgical clearance:  What type of surgery is being performed? Colonoscopy/EGD  When is this surgery scheduled? 02/04/2023  Are there any medications that need to be held prior to surgery and how long? No  Practice name and name of physician performing surgery? [facility], [name], MD   What is your office phone and fax number? Ph: [phone] Fx: [phone]  Anesthesia type (None, local, MAC, general) ? General   Amber Livingston 01/01/2023, 8:54 AM  _________________________________________________________________   (provider comments below)

## 2023-01-05 ENCOUNTER — Emergency Department: Payer: Medicaid Other

## 2023-01-05 ENCOUNTER — Other Ambulatory Visit: Payer: Self-pay

## 2023-01-05 ENCOUNTER — Encounter: Payer: Self-pay | Admitting: Emergency Medicine

## 2023-01-05 ENCOUNTER — Inpatient Hospital Stay
Admission: EM | Admit: 2023-01-05 | Discharge: 2023-01-08 | DRG: 641 | Disposition: A | Discharge status: Home / Self Care | Payer: Medicaid Other | Attending: Student | Admitting: Student

## 2023-01-05 DIAGNOSIS — F17220 Nicotine dependence, chewing tobacco, uncomplicated: Secondary | ICD-10-CM | POA: Diagnosis present

## 2023-01-05 DIAGNOSIS — D72819 Decreased white blood cell count, unspecified: Secondary | ICD-10-CM | POA: Diagnosis present

## 2023-01-05 DIAGNOSIS — Z681 Body mass index (BMI) 19 or less, adult: Secondary | ICD-10-CM | POA: Diagnosis not present

## 2023-01-05 DIAGNOSIS — G894 Chronic pain syndrome: Secondary | ICD-10-CM | POA: Diagnosis present

## 2023-01-05 DIAGNOSIS — F419 Anxiety disorder, unspecified: Secondary | ICD-10-CM | POA: Diagnosis present

## 2023-01-05 DIAGNOSIS — E782 Mixed hyperlipidemia: Secondary | ICD-10-CM | POA: Diagnosis present

## 2023-01-05 DIAGNOSIS — R634 Abnormal weight loss: Secondary | ICD-10-CM | POA: Diagnosis present

## 2023-01-05 DIAGNOSIS — E871 Hypo-osmolality and hyponatremia: Secondary | ICD-10-CM | POA: Diagnosis present

## 2023-01-05 DIAGNOSIS — Z885 Allergy status to narcotic agent status: Secondary | ICD-10-CM | POA: Diagnosis not present

## 2023-01-05 DIAGNOSIS — I10 Essential (primary) hypertension: Secondary | ICD-10-CM | POA: Diagnosis present

## 2023-01-05 DIAGNOSIS — Z791 Long term (current) use of non-steroidal anti-inflammatories (NSAID): Secondary | ICD-10-CM

## 2023-01-05 DIAGNOSIS — Z91018 Allergy to other foods: Secondary | ICD-10-CM

## 2023-01-05 DIAGNOSIS — K219 Gastro-esophageal reflux disease without esophagitis: Secondary | ICD-10-CM | POA: Diagnosis present

## 2023-01-05 DIAGNOSIS — R1319 Other dysphagia: Secondary | ICD-10-CM | POA: Diagnosis present

## 2023-01-05 DIAGNOSIS — F1290 Cannabis use, unspecified, uncomplicated: Secondary | ICD-10-CM | POA: Diagnosis present

## 2023-01-05 DIAGNOSIS — Z888 Allergy status to other drugs, medicaments and biological substances status: Secondary | ICD-10-CM | POA: Diagnosis not present

## 2023-01-05 DIAGNOSIS — M797 Fibromyalgia: Secondary | ICD-10-CM | POA: Diagnosis present

## 2023-01-05 DIAGNOSIS — Z8659 Personal history of other mental and behavioral disorders: Secondary | ICD-10-CM

## 2023-01-05 DIAGNOSIS — Z8249 Family history of ischemic heart disease and other diseases of the circulatory system: Secondary | ICD-10-CM | POA: Diagnosis not present

## 2023-01-05 DIAGNOSIS — R1084 Generalized abdominal pain: Secondary | ICD-10-CM | POA: Diagnosis present

## 2023-01-05 DIAGNOSIS — J439 Emphysema, unspecified: Secondary | ICD-10-CM | POA: Diagnosis present

## 2023-01-05 DIAGNOSIS — Z86010 Personal history of colonic polyps: Secondary | ICD-10-CM | POA: Diagnosis not present

## 2023-01-05 DIAGNOSIS — Z7951 Long term (current) use of inhaled steroids: Secondary | ICD-10-CM | POA: Diagnosis not present

## 2023-01-05 DIAGNOSIS — Z79899 Other long term (current) drug therapy: Secondary | ICD-10-CM

## 2023-01-05 HISTORY — DX: Decreased white blood cell count, unspecified: D72.819

## 2023-01-05 LAB — URINE DRUG SCREEN, QUALITATIVE (ARMC ONLY)
Amphetamines, Ur Screen: NOT DETECTED
Barbiturates, Ur Screen: NOT DETECTED
Benzodiazepine, Ur Scrn: NOT DETECTED
Cannabinoid 50 Ng, Ur ~~LOC~~: POSITIVE — AB
Cocaine Metabolite,Ur ~~LOC~~: NOT DETECTED
MDMA (Ecstasy)Ur Screen: NOT DETECTED
Methadone Scn, Ur: NOT DETECTED
Opiate, Ur Screen: NOT DETECTED
Phencyclidine (PCP) Ur S: NOT DETECTED
Tricyclic, Ur Screen: NOT DETECTED

## 2023-01-05 LAB — COMPREHENSIVE METABOLIC PANEL
ALT: 26 U/L (ref 0–44)
AST: 22 U/L (ref 15–41)
Albumin: 4.8 g/dL (ref 3.5–5.0)
Alkaline Phosphatase: 50 U/L (ref 38–126)
Anion gap: 9 (ref 5–15)
BUN: 6 mg/dL — ABNORMAL LOW (ref 8–23)
CO2: 23 mmol/L (ref 22–32)
Calcium: 8.8 mg/dL — ABNORMAL LOW (ref 8.9–10.3)
Chloride: 91 mmol/L — ABNORMAL LOW (ref 98–111)
Creatinine, Ser: 0.62 mg/dL (ref 0.44–1.00)
GFR, Estimated: >60 mL/min (ref >60)
Glucose, Bld: 86 mg/dL (ref 70–99)
Potassium: 4 mmol/L (ref 3.5–5.1)
Sodium: 123 mmol/L — ABNORMAL LOW (ref 135–145)
Total Bilirubin: 0.7 mg/dL (ref 0.3–1.2)
Total Protein: 7.2 g/dL (ref 6.5–8.1)

## 2023-01-05 LAB — TSH: TSH: 1.285 u[IU]/mL (ref 0.350–4.500)

## 2023-01-05 LAB — OSMOLALITY: Osmolality: 256 mOsm/kg — ABNORMAL LOW (ref 275–295)

## 2023-01-05 LAB — CBC
HCT: 36.2 % (ref 36.0–46.0)
Hemoglobin: 12.5 g/dL (ref 12.0–15.0)
MCH: 31.9 pg (ref 26.0–34.0)
MCHC: 34.5 g/dL (ref 30.0–36.0)
MCV: 92.3 fL (ref 80.0–100.0)
Platelets: 274 10*3/uL (ref 150–400)
RBC: 3.92 MIL/uL (ref 3.87–5.11)
RDW: 11.8 % (ref 11.5–15.5)
WBC: 3.6 10*3/uL — ABNORMAL LOW (ref 4.0–10.5)
nRBC: 0 % (ref 0.0–0.2)

## 2023-01-05 LAB — SALICYLATE LEVEL: Salicylate Lvl: <7 mg/dL — ABNORMAL LOW (ref 7.0–30.0)

## 2023-01-05 LAB — T4, FREE: Free T4: 0.64 ng/dL (ref 0.61–1.12)

## 2023-01-05 LAB — SODIUM, URINE, RANDOM: Sodium, Ur: 11 mmol/L

## 2023-01-05 LAB — ACETAMINOPHEN LEVEL: Acetaminophen (Tylenol), Serum: <10 ug/mL — ABNORMAL LOW (ref 10–30)

## 2023-01-05 LAB — ETHANOL: Alcohol, Ethyl (B): <10 mg/dL (ref <10)

## 2023-01-05 LAB — OSMOLALITY, URINE: Osmolality, Ur: 79 mOsm/kg — ABNORMAL LOW (ref 300–900)

## 2023-01-05 LAB — MAGNESIUM: Magnesium: 2.1 mg/dL (ref 1.7–2.4)

## 2023-01-05 MED ORDER — ARFORMOTEROL TARTRATE 15 MCG/2ML IN NEBU
15.0000 ug | INHALATION_SOLUTION | Freq: Two times a day (BID) | RESPIRATORY_TRACT | Status: DC
  Administered 2023-01-06 – 2023-01-08 (×5): 15 ug via RESPIRATORY_TRACT
  Filled 2023-01-05 (×7): qty 2

## 2023-01-05 MED ORDER — ALBUTEROL SULFATE (2.5 MG/3ML) 0.083% IN NEBU: 2.5000 mg | INHALATION_SOLUTION | Freq: Four times a day (QID) | RESPIRATORY_TRACT | Status: DC | PRN

## 2023-01-05 MED ORDER — ACETAMINOPHEN 325 MG PO TABS: 650.0000 mg | ORAL_TABLET | Freq: Four times a day (QID) | ORAL | Status: DC | PRN

## 2023-01-05 MED ORDER — ONDANSETRON HCL 4 MG/2ML IJ SOLN: 4.0000 mg | Freq: Four times a day (QID) | INTRAMUSCULAR | Status: DC | PRN

## 2023-01-05 MED ORDER — ONDANSETRON HCL 4 MG PO TABS: 4.0000 mg | ORAL_TABLET | Freq: Four times a day (QID) | ORAL | Status: DC | PRN

## 2023-01-05 MED ORDER — NICOTINE 14 MG/24HR TD PT24: 14.0000 mg | MEDICATED_PATCH | Freq: Every day | TRANSDERMAL | Status: DC | PRN

## 2023-01-05 MED ORDER — SENNOSIDES-DOCUSATE SODIUM 8.6-50 MG PO TABS: 1.0000 | ORAL_TABLET | Freq: Every evening | ORAL | Status: DC | PRN

## 2023-01-05 MED ORDER — DIAZEPAM 5 MG/ML IJ SOLN: 5.0000 mg | INTRAMUSCULAR | Status: DC | PRN

## 2023-01-05 MED ORDER — ESTRADIOL 0.5 MG PO TABS
0.5000 mg | ORAL_TABLET | Freq: Every day | ORAL | Status: DC
  Administered 2023-01-06 – 2023-01-08 (×3): 0.5 mg via ORAL
  Filled 2023-01-05 (×4): qty 1

## 2023-01-05 MED ORDER — MEDROXYPROGESTERONE ACETATE 2.5 MG PO TABS
2.5000 mg | ORAL_TABLET | Freq: Every day | ORAL | Status: DC
  Administered 2023-01-06 – 2023-01-08 (×3): 2.5 mg via ORAL
  Filled 2023-01-05 (×3): qty 1

## 2023-01-05 MED ORDER — ENOXAPARIN SODIUM 40 MG/0.4ML IJ SOSY
40.0000 mg | PREFILLED_SYRINGE | INTRAMUSCULAR | Status: DC
  Administered 2023-01-05 – 2023-01-06 (×2): 40 mg via SUBCUTANEOUS
  Filled 2023-01-05 (×3): qty 0.4

## 2023-01-05 MED ORDER — PANTOPRAZOLE SODIUM 40 MG PO TBEC: 40.0000 mg | DELAYED_RELEASE_TABLET | Freq: Every day | ORAL | Status: DC | PRN

## 2023-01-05 MED ORDER — DIAZEPAM 5 MG/ML IJ SOLN: 5.0000 mg | Freq: Four times a day (QID) | INTRAMUSCULAR | Status: AC | PRN

## 2023-01-05 MED ORDER — MELATONIN 5 MG PO TABS
5.0000 mg | ORAL_TABLET | Freq: Once | ORAL | Status: AC
  Administered 2023-01-05: 5 mg via ORAL
  Filled 2023-01-05: qty 1

## 2023-01-05 MED ORDER — UMECLIDINIUM BROMIDE 62.5 MCG/ACT IN AEPB
1.0000 | INHALATION_SPRAY | Freq: Every day | RESPIRATORY_TRACT | Status: DC
  Administered 2023-01-06 – 2023-01-08 (×3): 1 via RESPIRATORY_TRACT
  Filled 2023-01-05: qty 7

## 2023-01-05 MED ORDER — SODIUM CHLORIDE 0.9 % IV BOLUS
1000.0000 mL | Freq: Once | INTRAVENOUS | Status: AC
  Administered 2023-01-05: 1000 mL via INTRAVENOUS

## 2023-01-05 MED ORDER — BUDESONIDE 0.25 MG/2ML IN SUSP
2.0000 mL | Freq: Two times a day (BID) | RESPIRATORY_TRACT | Status: DC
  Administered 2023-01-06 – 2023-01-08 (×4): 0.25 mg via RESPIRATORY_TRACT
  Filled 2023-01-05 (×6): qty 2

## 2023-01-05 MED ORDER — MELOXICAM 7.5 MG PO TABS
15.0000 mg | ORAL_TABLET | Freq: Every day | ORAL | Status: DC | PRN
  Administered 2023-01-06 – 2023-01-08 (×2): 15 mg via ORAL
  Filled 2023-01-05 (×3): qty 2

## 2023-01-05 MED ORDER — DULOXETINE HCL 20 MG PO CPEP
40.0000 mg | ORAL_CAPSULE | Freq: Every day | ORAL | Status: DC
  Administered 2023-01-06 – 2023-01-08 (×3): 40 mg via ORAL
  Filled 2023-01-05 (×3): qty 2

## 2023-01-05 MED ORDER — ACETAMINOPHEN 650 MG RE SUPP: 650.0000 mg | Freq: Four times a day (QID) | RECTAL | Status: DC | PRN

## 2023-01-05 NOTE — Assessment & Plan Note (Signed)
Continue diazepam 5 mg IV IV every 6 hours as needed for anxiety, 20 hours of coverage ordered

## 2023-01-05 NOTE — Assessment & Plan Note (Signed)
Resumed home duloxetine 40 mg daily

## 2023-01-05 NOTE — ED Notes (Signed)
Lab made aware of add-on tests.  

## 2023-01-05 NOTE — ED Triage Notes (Signed)
Pt to ED via POV c/o hypertension x 45 minutes. Pt states that just finished 2 courses of antifungals for yeast infection that she states has taken over her body. Pt states that he fungus has taken over her body again and it started about 1 hour ago. Pt is concern that she septic from the fungus. Pt states that she can feel her body being eaten from the inside out from the fungus in her body. Pt has appt for colonoscopy in June.

## 2023-01-05 NOTE — Assessment & Plan Note (Signed)
Patient's baseline WBC in the last year has been 3.4-4.7 This is patient's baseline WBC level
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
## 2023-01-05 NOTE — ED Provider Notes (Signed)
[facility] Provider Note    Event Date/Time   First MD Initiated Contact with Patient 01/05/23 1533     (approximate)   History   Chief Complaint: Hypertension   HPI  Amber Livingston is a 62 y.o. female with a history of GERD, uterine fibroids, hypertension, anxiety, emphysema, fibromyalgia who comes ED complaining of high blood pressure, and worried that she has an overwhelming fungal infection that is going to cause her to become septic.  She reports a recent hospitalization for fungal sepsis as well as multiple recent visits by ENT, gastroenterology, and pulmonology but has been unable to diagnose her fungal illness.  She is not able to name which physicians or hospitals she has been to recently.  She reports an unintentional 12 pound weight loss over the past 2 months.     Physical Exam   Triage Vital Signs: ED Triage Vitals  Enc Vitals Group     BP 01/05/23 1254 (!) 175/84     Pulse Rate 01/05/23 1254 71     Resp 01/05/23 1254 16     Temp 01/05/23 1254 97.9 F (36.6 C)     Temp src --      SpO2 01/05/23 1254 100 %     Weight 01/05/23 1255 112 lb (50.8 kg)     Height 01/05/23 1255 5\' 8"  (1.727 m)     Head Circumference --      Peak Flow --      Pain Score 01/05/23 1255 4     Pain Loc --      Pain Edu? --      Excl. in GC? --     Most recent vital signs: Vitals:   01/05/23 1745 01/05/23 1748  BP:    Pulse: 65   Resp: 10   Temp:  97.6 F (36.4 C)  SpO2: 100%     General: Awake, no distress.  CV:  Good peripheral perfusion.  Regular rate and rhythm Resp:  Normal effort.  Clear to auscultation bilaterally Abd:  No distention.  Soft nontender Other:  Appears anxious and emotionally upset.  Moving all extremities, neurologically intact.  Normal language and speech and cognition.  Normal motor function and coordination.   ED Results / Procedures / Treatments   Labs (all labs ordered are listed, but only abnormal results are  displayed) Labs Reviewed  COMPREHENSIVE METABOLIC PANEL - Abnormal; Notable for the following components:      Result Value   Sodium 123 (*)    Chloride 91 (*)    BUN 6 (*)    Calcium 8.8 (*)    All other components within normal limits  SALICYLATE LEVEL - Abnormal; Notable for the following components:   Salicylate Lvl <7.0 (*)    All other components within normal limits  ACETAMINOPHEN LEVEL - Abnormal; Notable for the following components:   Acetaminophen (Tylenol), Serum <10 (*)    All other components within normal limits  CBC - Abnormal; Notable for the following components:   WBC 3.6 (*)    All other components within normal limits  URINE DRUG SCREEN, QUALITATIVE ([facility] ONLY) - Abnormal; Notable for the following components:   Cannabinoid 50 Ng, Ur [place] POSITIVE (*)    All other components within normal limits  OSMOLALITY - Abnormal; Notable for the following components:   Osmolality 256 (*)    All other components within normal limits  ETHANOL  TSH  T4, FREE  MAGNESIUM  BASIC METABOLIC PANEL  CBC  OSMOLALITY, URINE  SODIUM, URINE, RANDOM  HIV ANTIBODY (ROUTINE TESTING W REFLEX)     EKG Interpreted by me Sinus rhythm rate of 68.  Normal axis, normal intervals.  Poor R wave progression.  Normal ST segments and T waves.   RADIOLOGY CT head interpreted by me, negative for mass.  Radiology report reviewed.  X-ray chest and abdomen unremarkable.   PROCEDURES:  Procedures   MEDICATIONS ORDERED IN ED: Medications  nicotine (NICODERM CQ - dosed in mg/24 hours) patch 14 mg (has no administration in time range)  acetaminophen (TYLENOL) tablet 650 mg (has no administration in time range)    Or  acetaminophen (TYLENOL) suppository 650 mg (has no administration in time range)  ondansetron (ZOFRAN) tablet 4 mg (has no administration in time range)    Or  ondansetron (ZOFRAN) injection 4 mg (has no administration in time range)  enoxaparin (LOVENOX) injection 40 mg  (has no administration in time range)  senna-docusate (Senokot-S) tablet 1 tablet (has no administration in time range)  diazepam (VALIUM) injection 5 mg (has no administration in time range)  sodium chloride 0.9 % bolus 1,000 mL (0 mLs Intravenous Stopped 01/05/23 1748)     IMPRESSION / MDM / ASSESSMENT AND PLAN / ED COURSE  I reviewed the triage vital signs and the nursing notes.  DDx: Delusional disorder, electrolyte abnormality, intoxication, intracranial mass, lung cancer, hyperthyroidism  Patient's presentation is most consistent with acute presentation with potential threat to life or bodily function.  Patient presents with what appears to be a fixed delusion about having a systemic fungal infection.  She has noticed evidence of fungal infection on exam, vital signs are normal.  Labs do show a sodium level of 123 which is an acute hyponatremia change from her baseline.  Toxicology workup is negative except for cannabinoid in the UA.  Saline bolus ordered.  Case discussed with hospitalist for further management.       FINAL CLINICAL IMPRESSION(S) / ED DIAGNOSES   Final diagnoses:  Hyponatremia     Rx / DC Orders   ED Discharge Orders     None        Note:  This document was prepared using Dragon voice recognition software and may include unintentional dictation errors.   Sharman Cheek, MD 01/05/23 1755

## 2023-01-05 NOTE — ED Notes (Signed)
ED Provider at bedside. 

## 2023-01-05 NOTE — Progress Notes (Signed)
Read chart for report

## 2023-01-05 NOTE — Assessment & Plan Note (Signed)
Workup in progress Check urine osmolality, serum osmolality, urine sodium level Status post sodium chloride 1 L bolus per EDP

## 2023-01-05 NOTE — ED Notes (Signed)
Patient transported to CT and xray 

## 2023-01-05 NOTE — Hospital Course (Signed)
Mr. Janiya Kingen is a 62 year old female with history of anxiety, depression, GERD, history of thrush in early May 2024, GERD, aortic atherosclerosis, hypertension, who presents emergency department for chief concerns of elevated hypertension and concerns for a yeast infection that has taken over her body.  Vitals in the ED showed temperature 97.9, respiration rate of 16, heart rate 71, blood pressure 135/84, SpO2 of 100% on room air.  Serum sodium is 123, potassium 4.0, chloride 91, bicarb 23, BUN of 6, serum creatinine 0.62, EGFR greater than 60, nonfasting blood glucose 86, WBC 3.6, hemoglobin 12.5, pulse of 274.  Acetaminophen level was less than 10.  Salicylate acid level less than 7.0.  TSH was within normal limits.  UDS was positive for THC use.  ED treatment: Sodium chloride 1 L bolus.  Diazepam 5 mg IV one-time dose.

## 2023-01-05 NOTE — ED Notes (Signed)
Pt continues to state that she is dying of fungal sepsis.  Pt reassured of her condition by mentioning her vitals and lab work.  Pt does not believe these prove anything and sts we are not doing the test that shows fungal sepsis.  Also, sts her vitals were "all over the place" prior to arrival.

## 2023-01-05 NOTE — Assessment & Plan Note (Signed)
-   As needed nicotine patch ordered ?

## 2023-01-05 NOTE — ED Notes (Signed)
Pt sts "I am dying from fungal sepsis.  No one will listen.  I've been seen by Pulmonology and ENT.  They put a tube down my throat but they missed the thrush because it's in a different location."  Pt believes the fungus has attacked her brain and GI system as well.

## 2023-01-06 ENCOUNTER — Telehealth (HOSPITAL_BASED_OUTPATIENT_CLINIC_OR_DEPARTMENT_OTHER): Payer: Self-pay | Admitting: Pulmonary Disease

## 2023-01-06 DIAGNOSIS — E871 Hypo-osmolality and hyponatremia: Secondary | ICD-10-CM | POA: Diagnosis not present

## 2023-01-06 DIAGNOSIS — R1319 Other dysphagia: Secondary | ICD-10-CM | POA: Diagnosis not present

## 2023-01-06 DIAGNOSIS — R1084 Generalized abdominal pain: Secondary | ICD-10-CM

## 2023-01-06 HISTORY — DX: Other dysphagia: R13.19

## 2023-01-06 LAB — CBC
HCT: 36.9 % (ref 36.0–46.0)
Hemoglobin: 12.9 g/dL (ref 12.0–15.0)
MCH: 31.9 pg (ref 26.0–34.0)
MCHC: 35 g/dL (ref 30.0–36.0)
MCV: 91.3 fL (ref 80.0–100.0)
Platelets: 267 10*3/uL (ref 150–400)
RBC: 4.04 MIL/uL (ref 3.87–5.11)
RDW: 11.7 % (ref 11.5–15.5)
WBC: 3.4 10*3/uL — ABNORMAL LOW (ref 4.0–10.5)
nRBC: 0 % (ref 0.0–0.2)

## 2023-01-06 LAB — BASIC METABOLIC PANEL
Anion gap: 6 (ref 5–15)
BUN: 9 mg/dL (ref 8–23)
CO2: 23 mmol/L (ref 22–32)
Calcium: 8.9 mg/dL (ref 8.9–10.3)
Chloride: 105 mmol/L (ref 98–111)
Creatinine, Ser: 0.73 mg/dL (ref 0.44–1.00)
GFR, Estimated: >60 mL/min (ref >60)
Glucose, Bld: 88 mg/dL (ref 70–99)
Potassium: 4.3 mmol/L (ref 3.5–5.1)
Sodium: 134 mmol/L — ABNORMAL LOW (ref 135–145)

## 2023-01-06 LAB — HIV ANTIBODY (ROUTINE TESTING W REFLEX): HIV Screen 4th Generation wRfx: NONREACTIVE

## 2023-01-06 MED ORDER — AMLODIPINE BESYLATE 5 MG PO TABS
5.0000 mg | ORAL_TABLET | Freq: Every day | ORAL | Status: DC
  Administered 2023-01-06 – 2023-01-08 (×3): 5 mg via ORAL
  Filled 2023-01-06 (×3): qty 1

## 2023-01-06 MED ORDER — HALOPERIDOL LACTATE 5 MG/ML IJ SOLN
2.0000 mg | Freq: Four times a day (QID) | INTRAMUSCULAR | Status: DC | PRN
  Administered 2023-01-06: 2 mg via INTRAVENOUS
  Filled 2023-01-06: qty 1

## 2023-01-06 MED ORDER — SODIUM CHLORIDE 0.9 % IV SOLN: INTRAVENOUS | Status: DC

## 2023-01-06 MED ORDER — PEG 3350-KCL-NABCB-NACL-NASULF 236 G PO SOLR
4000.0000 mL | Freq: Once | ORAL | Status: AC
  Administered 2023-01-06: 4000 mL via ORAL
  Filled 2023-01-06: qty 4000

## 2023-01-06 NOTE — Consult Note (Signed)
  Psychiatry: Consult received.  Chart reviewed.  62 year old woman with a history of multiple chronic orthopedic issues and a mention of depression and anxiety in her chart but on whom I was unable to locate any specific mental health notes.  Came into the emergency room complaining of believing that she has fungal sepsis.  Consult requested for possible psychosis.  Came to see the patient in her room and she was sound asleep.  Given the recent notes and her behavior it seemed to me probably best to let her get some rest.  We will come by later to reassess tomorrow.

## 2023-01-06 NOTE — Progress Notes (Signed)
  Transition of Care (TOC) Screening Note   Patient Details  Name: Amber Livingston Date of Birth: 26-Dec-1960   Transition of Care [facility]) CM/SW Contact:    Chapman Fitch, RN Phone Number: 01/06/2023, 4:01 PM    Transition of Care Department [facility]) has reviewed patient and no TOC needs have been identified at this time. We will continue to monitor patient advancement through interdisciplinary progression rounds. If new patient transition needs arise, please place a TOC consult.
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
## 2023-01-06 NOTE — Telephone Encounter (Signed)
Pt. Is in the [place] and wants the Dr. To know

## 2023-01-06 NOTE — Progress Notes (Signed)
[facility] Progress Note  Patient: Amber Livingston    WUJ:[id number]  DOA: 01/05/2023     Date of Service: the patient was seen and examined on 01/06/2023  Chief Complaint  Patient presents with   Hypertension   Brief hospital course: Mr. Izola Netter is a 62 year old female with history of anxiety, depression, GERD, history of thrush in early May 2024, GERD, aortic atherosclerosis, hypertension, who presents emergency department for chief concerns of elevated hypertension and concerns for a yeast infection that has taken over her body.   ED workup: Hyponatremia sodium 123,Serum osmolality 256 Urine osmolality 79 UDS positive for marijuana CT head: No acute intracranial abnormality.  CXR: Emphysema. No acute airspace disease.  AXR: Negative  [facility] hospitalist consulted for admission and further management as below   Assessment and Plan:  Hypotonic hyponatremia Sodium 123--134 improved Patient was advised to continue fluid striction 1.5L per day  Monitor sodium level daily   Patient is concerned about esophageal candidiasis, requesting GI consult GI consulted for possible EGD and colonoscopy Patient requested blood cultures  Anxiety Continue diazepam 5 mg IV IV every 6 hours as needed for anxiety, 20 hours of coverage ordered 5/20 patient was very hyperactive, anxious and agitated Haldol as needed order placed Psych consulted for further recommendation   Hypertension, blood pressure is elevated could be secondary to anxiety Started amlodipine 5 mg p.o. daily Monitor BP and titrate medications accordingly   Leukopenia Patient's baseline WBC in the last year has been 3.4-4.7 This is patient's baseline WBC level   History of COPD/emphysema Continue Brovana nebulizer twice daily and Incruse Ellipta inhaler Continue Pulmicort nebulizer twice daily   Nicotine dependence, chewing tobacco, uncomplicated As needed nicotine patch ordered   Fibromyalgia Resumed home  duloxetine 40 mg daily   Body mass index is 17.93 kg/m.  Interventions:     Diet: Regular diet DVT Prophylaxis: Subcutaneous Lovenox   Advance goals of care discussion: Full code  Family Communication: family was not present at bedside, at the time of interview.  The pt provided permission to discuss medical plan with the family. Opportunity was given to ask question and all questions were answered satisfactorily.   Disposition:  Pt is from Home, admitted with hyponatremia, patient has anxiety and agitated, requesting GI consult for scope and consulted psych as well due to psychosis, which precludes a safe discharge. Discharge to Home, when clinically stable, may need few days to stabilize.  Subjective: No significant events overnight, in the morning patient was very agitated, stated that she is being sick for so long and she has seen some any doctors and no one is taking care of her and no one understands what is happening with her she diagnosed herself with recurrent esophagitis and requesting GI consult for EGD and colonoscopy.  She is requesting her stool to be tested for fungus culture.  She requested blood cultures.   Physical Exam: General: NAD, lying comfortably Appear in no distress, affect appropriate Eyes: PERRLA ENT: Oral Mucosa Clear, moist  Neck: no JVD,  Cardiovascular: S1 and S2 Present, no Murmur,  Respiratory: good respiratory effort, Bilateral Air entry equal and Decreased, no Crackles, no wheezes Abdomen: Bowel Sound present, Soft and no tenderness,  Skin: no rashes Extremities: no Pedal edema, no calf tenderness Neurologic: without any new focal findings Gait not checked due to patient safety concerns  Vitals:   01/05/23 2133 01/06/23 0435 01/06/23 0840 01/06/23 1459  BP: (!) 142/84 129/77 (!) 140/84 135/78  Pulse: 62 73  64 60  Resp: 18 16 20 19   Temp: 97.8 F (36.6 C) 98.5 F (36.9 C) 98 F (36.7 C) 98 F (36.7 C)  TempSrc: Oral Oral Oral Oral   SpO2: 99% 98% 98% 100%  Weight: 53.5 kg     Height: 5\' 8"  (1.727 m)       Intake/Output Summary (Last 24 hours) at 01/06/2023 1612 Last data filed at 01/06/2023 1418 Gross per 24 hour  Intake 600 ml  Output 0 ml  Net 600 ml   Filed Weights   01/05/23 1255 01/05/23 2133  Weight: 50.8 kg 53.5 kg    Data Reviewed: I have personally reviewed and interpreted daily labs, tele strips, imagings as discussed above. I reviewed all nursing notes, pharmacy notes, vitals, pertinent old records I have discussed plan of care as described above with RN and patient/family.  CBC: Recent Labs  Lab 01/05/23 1310 01/06/23 0503  WBC 3.6* 3.4*  HGB 12.5 12.9  HCT 36.2 36.9  MCV 92.3 91.3  PLT 274 267   Basic Metabolic Panel: Recent Labs  Lab 01/05/23 1310 01/06/23 0503  NA 123* 134*  K 4.0 4.3  CL 91* 105  CO2 23 23  GLUCOSE 86 88  BUN 6* 9  CREATININE 0.62 0.73  CALCIUM 8.8* 8.9  MG 2.1  --     Studies: CT Head Wo Contrast  Result Date: 01/05/2023 CLINICAL DATA:  62 year old female with altered mental status. EXAM: CT HEAD WITHOUT CONTRAST TECHNIQUE: Contiguous axial images were obtained from the base of the skull through the vertex without intravenous contrast. RADIATION DOSE REDUCTION: This exam was performed according to the departmental dose-optimization program which includes automated exposure control, adjustment of the mA and/or kV according to patient size and/or use of iterative reconstruction technique. COMPARISON:  02/08/2022 MR FINDINGS: Brain: No evidence of acute infarction, hemorrhage, hydrocephalus, extra-axial collection or mass lesion/mass effect. Vascular: No hyperdense vessel or unexpected calcification. Skull: Normal. Negative for fracture or focal lesion. Sinuses/Orbits: No acute finding. Other: None. IMPRESSION: No acute intracranial abnormality. Electronically Signed   By: Harmon Pier M.D.   On: 01/05/2023 16:29    Scheduled Meds:  amLODipine  5 mg Oral Daily    arformoterol  15 mcg Nebulization BID   And   umeclidinium bromide  1 puff Inhalation Daily   budesonide  2 mL Nebulization BID   DULoxetine  40 mg Oral Daily   enoxaparin (LOVENOX) injection  40 mg Subcutaneous Q24H   estradiol  0.5 mg Oral Daily   medroxyPROGESTERone  2.5 mg Oral Daily   Continuous Infusions: PRN Meds: acetaminophen **OR** acetaminophen, albuterol, haloperidol lactate, meloxicam, nicotine, ondansetron **OR** ondansetron (ZOFRAN) IV, pantoprazole, senna-docusate  Time spent: 35 minutes  Author: Gillis Santa. MD [facility] 01/06/2023 4:12 PM  To reach On-call, see care teams to locate the attending and reach out to them via [email or web address]. If 7PM-7AM, please contact night-coverage If you still have difficulty reaching the attending provider, please page the [facility] (Director on Call) for [facility] on amion for assistance.

## 2023-01-06 NOTE — Progress Notes (Signed)
Pt refuses to have bed alarm on

## 2023-01-06 NOTE — Telephone Encounter (Signed)
Routing to Dr. Ellison as an FYI. 

## 2023-01-07 ENCOUNTER — Encounter: Payer: Self-pay | Admitting: Internal Medicine

## 2023-01-07 DIAGNOSIS — E871 Hypo-osmolality and hyponatremia: Secondary | ICD-10-CM | POA: Diagnosis not present

## 2023-01-07 LAB — BASIC METABOLIC PANEL
Anion gap: 7 (ref 5–15)
BUN: 9 mg/dL (ref 8–23)
CO2: 23 mmol/L (ref 22–32)
Calcium: 8.7 mg/dL — ABNORMAL LOW (ref 8.9–10.3)
Chloride: 103 mmol/L (ref 98–111)
Creatinine, Ser: 0.58 mg/dL (ref 0.44–1.00)
GFR, Estimated: >60 mL/min (ref >60)
Glucose, Bld: 93 mg/dL (ref 70–99)
Potassium: 4.1 mmol/L (ref 3.5–5.1)
Sodium: 133 mmol/L — ABNORMAL LOW (ref 135–145)

## 2023-01-07 LAB — CULTURE, BLOOD (ROUTINE X 2): Special Requests: ADEQUATE

## 2023-01-07 LAB — PHOSPHORUS: Phosphorus: 4.4 mg/dL (ref 2.5–4.6)

## 2023-01-07 LAB — CBC
HCT: 35.1 % — ABNORMAL LOW (ref 36.0–46.0)
Hemoglobin: 12.3 g/dL (ref 12.0–15.0)
MCH: 31.9 pg (ref 26.0–34.0)
MCHC: 35 g/dL (ref 30.0–36.0)
MCV: 90.9 fL (ref 80.0–100.0)
Platelets: 239 10*3/uL (ref 150–400)
RBC: 3.86 MIL/uL — ABNORMAL LOW (ref 3.87–5.11)
RDW: 11.8 % (ref 11.5–15.5)
WBC: 2.8 10*3/uL — ABNORMAL LOW (ref 4.0–10.5)
nRBC: 0 % (ref 0.0–0.2)

## 2023-01-07 LAB — MAGNESIUM: Magnesium: 2 mg/dL (ref 1.7–2.4)

## 2023-01-07 NOTE — Progress Notes (Signed)
Patient completed bowel prep solution. Stated she is having yellow liquid stool with no solids.
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
## 2023-01-07 NOTE — Telephone Encounter (Signed)
Noted. Based on H&P. Admission for hyponatremia. Will follow-up with patient for pulmonary issues as scheduled in June.

## 2023-01-07 NOTE — Progress Notes (Signed)
[facility] Progress Note  Patient: Amber Livingston    GUR:[id number]  DOA: 01/05/2023     Date of Service: the patient was seen and examined on 01/07/2023  Chief Complaint  Patient presents with   Hypertension   Brief hospital course: Mr. Kalysa Plummer is a 62 year old female with history of anxiety, depression, GERD, history of thrush in early May 2024, GERD, aortic atherosclerosis, hypertension, who presents emergency department for chief concerns of elevated hypertension and concerns for a yeast infection that has taken over her body.   ED workup: Hyponatremia sodium 123,Serum osmolality 256 Urine osmolality 79 UDS positive for marijuana CT head: No acute intracranial abnormality.  CXR: Emphysema. No acute airspace disease.  AXR: Negative  [facility] hospitalist consulted for admission and further management as below   Assessment and Plan:  Hypotonic hyponatremia Sodium 123--133 improved Patient was advised to continue fluid striction 1.5L per day  Monitor sodium level daily   Patient is concerned about esophageal candidiasis, requesting GI consult GI consulted, for EGD and colonoscopy, GI prep was started, will continue GI prep today for possible scope tomorrow a.m. Patient requested blood cultures Blood culture NGTD  Anxiety Continue diazepam 5 mg IV IV every 6 hours as needed for anxiety, 20 hours of coverage ordered 5/20 patient was very hyperactive, anxious and agitated Haldol as needed order placed Psych consulted for further recommendation   Hypertension, blood pressure is elevated could be secondary to anxiety Started amlodipine 5 mg p.o. daily Monitor BP and titrate medications accordingly   Leukopenia Patient's baseline WBC in the last year has been 3.4-4.7 This is patient's baseline WBC level   History of COPD/emphysema Continue Brovana nebulizer twice daily and Incruse Ellipta inhaler Continue Pulmicort nebulizer twice daily   Nicotine dependence, chewing  tobacco, uncomplicated As needed nicotine patch ordered   Fibromyalgia Resumed home duloxetine 40 mg daily   Body mass index is 17.93 kg/m.  Interventions:     Diet: Regular diet DVT Prophylaxis: Subcutaneous Lovenox   Advance goals of care discussion: Full code  Family Communication: family was not present at bedside, at the time of interview.  The pt provided permission to discuss medical plan with the family. Opportunity was given to ask question and all questions were answered satisfactorily.   Disposition:  Pt is from Home, admitted with hyponatremia, patient has anxiety and agitated, requesting GI consult for scope and consulted psych as well due to psychosis, which precludes a safe discharge. Discharge to Home, when clinically stable, may need few days to stabilize.  Subjective: No significant events overnight, patient could not finish the GI prep, she agreed to continue today for GI prep and scope tomorrow a.m.  Denies any complaints.  Physical Exam: General: NAD, lying comfortably Appear in no distress, affect appropriate Eyes: PERRLA ENT: Oral Mucosa Clear, moist  Neck: no JVD,  Cardiovascular: S1 and S2 Present, no Murmur,  Respiratory: good respiratory effort, Bilateral Air entry equal and Decreased, no Crackles, no wheezes Abdomen: Bowel Sound present, Soft and no tenderness,  Skin: no rashes Extremities: no Pedal edema, no calf tenderness Neurologic: without any new focal findings Gait not checked due to patient safety concerns  Vitals:   01/06/23 1938 01/07/23 0456 01/07/23 0735 01/07/23 0836  BP: 100/62 131/74  119/76  Pulse: 65 69 70 76  Resp: 16 18 18 18   Temp: 98.1 F (36.7 C) 98 F (36.7 C)  97.9 F (36.6 C)  TempSrc: Oral Oral    SpO2: 99% 100% 100% 94%  Weight:      Height:        Intake/Output Summary (Last 24 hours) at 01/07/2023 1704 Last data filed at 01/07/2023 1054 Gross per 24 hour  Intake 1320 ml  Output --  Net 1320 ml   Filed  Weights   01/05/23 1255 01/05/23 2133  Weight: 50.8 kg 53.5 kg    Data Reviewed: I have personally reviewed and interpreted daily labs, tele strips, imagings as discussed above. I reviewed all nursing notes, pharmacy notes, vitals, pertinent old records I have discussed plan of care as described above with RN and patient/family.  CBC: Recent Labs  Lab 01/05/23 1310 01/06/23 0503 01/07/23 0356  WBC 3.6* 3.4* 2.8*  HGB 12.5 12.9 12.3  HCT 36.2 36.9 35.1*  MCV 92.3 91.3 90.9  PLT 274 267 239   Basic Metabolic Panel: Recent Labs  Lab 01/05/23 1310 01/06/23 0503 01/07/23 0356  NA 123* 134* 133*  K 4.0 4.3 4.1  CL 91* 105 103  CO2 23 23 23   GLUCOSE 86 88 93  BUN 6* 9 9  CREATININE 0.62 0.73 0.58  CALCIUM 8.8* 8.9 8.7*  MG 2.1  --  2.0  PHOS  --   --  4.4    Studies: No results found.  Scheduled Meds:  amLODipine  5 mg Oral Daily   arformoterol  15 mcg Nebulization BID   And   umeclidinium bromide  1 puff Inhalation Daily   budesonide  2 mL Nebulization BID   DULoxetine  40 mg Oral Daily   enoxaparin (LOVENOX) injection  40 mg Subcutaneous Q24H   estradiol  0.5 mg Oral Daily   medroxyPROGESTERone  2.5 mg Oral Daily   Continuous Infusions:  sodium chloride     PRN Meds: acetaminophen **OR** acetaminophen, albuterol, haloperidol lactate, meloxicam, nicotine, ondansetron **OR** ondansetron (ZOFRAN) IV, pantoprazole, senna-docusate  Time spent: 35 minutes  Author: Gillis Santa. MD [facility] 01/07/2023 5:04 PM  To reach On-call, see care teams to locate the attending and reach out to them via [email or web address]. If 7PM-7AM, please contact night-coverage If you still have difficulty reaching the attending provider, please page the [facility] (Director on Call) for [facility] on amion for assistance.

## 2023-01-08 ENCOUNTER — Encounter: Payer: Self-pay | Admitting: Internal Medicine

## 2023-01-08 ENCOUNTER — Inpatient Hospital Stay: Payer: Medicaid Other | Admitting: Anesthesiology

## 2023-01-08 ENCOUNTER — Encounter
Admission: EM | Disposition: A | Discharge status: Home / Self Care | Payer: Self-pay | Source: Home / Self Care | Attending: Student

## 2023-01-08 DIAGNOSIS — R1084 Generalized abdominal pain: Secondary | ICD-10-CM | POA: Diagnosis not present

## 2023-01-08 DIAGNOSIS — E871 Hypo-osmolality and hyponatremia: Secondary | ICD-10-CM | POA: Diagnosis not present

## 2023-01-08 HISTORY — PX: COLONOSCOPY WITH PROPOFOL: SHX5780

## 2023-01-08 HISTORY — PX: ESOPHAGOGASTRODUODENOSCOPY (EGD) WITH PROPOFOL: SHX5813

## 2023-01-08 LAB — CULTURE, BLOOD (ROUTINE X 2)
Culture: NO GROWTH
Special Requests: ADEQUATE

## 2023-01-08 SURGERY — ESOPHAGOGASTRODUODENOSCOPY (EGD) WITH PROPOFOL
Anesthesia: General

## 2023-01-08 MED ORDER — HYDROXYZINE HCL 25 MG PO TABS: 25.0000 mg | ORAL_TABLET | Freq: Three times a day (TID) | ORAL | Status: DC | PRN

## 2023-01-08 MED ORDER — PROPOFOL 500 MG/50ML IV EMUL
INTRAVENOUS | Status: DC | PRN
  Administered 2023-01-08: 140 ug/kg/min via INTRAVENOUS

## 2023-01-08 MED ORDER — LIDOCAINE HCL (CARDIAC) PF 100 MG/5ML IV SOSY
PREFILLED_SYRINGE | INTRAVENOUS | Status: DC | PRN
  Administered 2023-01-08: 60 mg via INTRAVENOUS

## 2023-01-08 MED ORDER — SODIUM CHLORIDE 0.9 % IV SOLN: INTRAVENOUS | Status: DC

## 2023-01-08 MED ORDER — DEXMEDETOMIDINE HCL IN NACL 80 MCG/20ML IV SOLN
INTRAVENOUS | Status: DC | PRN
  Administered 2023-01-08: 12 ug via INTRAVENOUS

## 2023-01-08 MED ORDER — PROPOFOL 10 MG/ML IV BOLUS
INTRAVENOUS | Status: DC | PRN
  Administered 2023-01-08: 30 mg via INTRAVENOUS
  Administered 2023-01-08: 70 mg via INTRAVENOUS

## 2023-01-08 MED ORDER — HYDRALAZINE HCL 50 MG PO TABS: 50.0000 mg | ORAL_TABLET | Freq: Three times a day (TID) | ORAL | 0 refills | Status: DC | PRN

## 2023-01-08 NOTE — Op Note (Signed)
[facility] Gastroenterology Patient Name: Laterica Yung Procedure Date: 01/08/2023 11:54 AM MRN: [id number] Account #: [id number] Date of Birth: 08-04-1961 Admit Type: Outpatient Age: 62 Room: [facility] ENDO ROOM 2 Gender: Female Note Status: Finalized Instrument Name: Peds Colonoscope [id number] Procedure:             Colonoscopy Indications:           Generalized abdominal pain Providers:             Toney Reil MD, MD Medicines:             General Anesthesia Complications:         No immediate complications. Estimated blood loss: None. Procedure:             Pre-Anesthesia Assessment:                        - Prior to the procedure, a History and Physical was                         performed, and patient medications and allergies were                         reviewed. The patient is competent. The risks and                         benefits of the procedure and the sedation options and                         risks were discussed with the patient. All questions                         were answered and informed consent was obtained.                         Patient identification and proposed procedure were                         verified by the physician, the nurse, the                         anesthesiologist, the anesthetist and the technician                         in the pre-procedure area in the procedure room in the                         endoscopy suite. Mental Status Examination: alert and                         oriented. Airway Examination: normal oropharyngeal                         airway and neck mobility. Respiratory Examination:                         clear to auscultation. CV Examination: normal.                         Prophylactic Antibiotics: The patient  does not require                         prophylactic antibiotics. Prior Anticoagulants: The                         patient has taken no anticoagulant or antiplatelet                          agents. ASA Grade Assessment: III - A patient with                         severe systemic disease. After reviewing the risks and                         benefits, the patient was deemed in satisfactory                         condition to undergo the procedure. The anesthesia                         plan was to use general anesthesia. Immediately prior                         to administration of medications, the patient was                         re-assessed for adequacy to receive sedatives. The                         heart rate, respiratory rate, oxygen saturations,                         blood pressure, adequacy of pulmonary ventilation, and                         response to care were monitored throughout the                         procedure. The physical status of the patient was                         re-assessed after the procedure.                        After obtaining informed consent, the colonoscope was                         passed under direct vision. Throughout the procedure,                         the patient's blood pressure, pulse, and oxygen                         saturations were monitored continuously. The                         Colonoscope was introduced through the anus and  advanced to the the cecum, identified by appendiceal                         orifice and ileocecal valve. The colonoscopy was                         performed with moderate difficulty due to inadequate                         bowel prep and significant looping. Successful                         completion of the procedure was aided by applying                         abdominal pressure. The patient tolerated the                         procedure well. The quality of the bowel preparation                         was poor. The ileocecal valve, appendiceal orifice,                         and rectum were photographed. Findings:      The perianal and digital  rectal examinations were normal. Pertinent       negatives include normal sphincter tone and no palpable rectal lesions.      The entire examined colon appeared normal.      The retroflexed view of the distal rectum and anal verge was normal and       showed no anal or rectal abnormalities. Impression:            - Preparation of the colon was poor.                        - The entire examined colon is normal.                        - The distal rectum and anal verge are normal on                         retroflexion view.                        - No specimens collected. Recommendation:        - Resume previous diet today.                        - Continue present medications.                        - Return patient to hospital ward for possible                         discharge same day. Procedure Code(s):     --- Professional ---                        [id number], Colonoscopy, flexible; diagnostic, including  collection of specimen(s) by brushing or washing, when                         performed (separate procedure) Diagnosis Code(s):     --- Professional ---                        R10.84, Generalized abdominal pain CPT copyright 2022 [employer]. All rights reserved. The codes documented in this report are preliminary and upon coder review may  be revised to meet current compliance requirements. Dr. Libby Maw Toney Reil MD, MD 01/08/2023 60:45:40 PM This report has been signed electronically. Number of Addenda: 0 Note Initiated On: 01/08/2023 11:54 AM Scope Withdrawal Time: 0 hours 7 minutes 9 seconds  Total Procedure Duration: 0 hours 11 minutes 13 seconds  Estimated Blood Loss:  Estimated blood loss: none.      [facility]

## 2023-01-08 NOTE — Anesthesia Postprocedure Evaluation (Signed)
Anesthesia Post Note  Patient: Tyrika Dice  Procedure(s) Performed: ESOPHAGOGASTRODUODENOSCOPY (EGD) WITH PROPOFOL COLONOSCOPY WITH PROPOFOL  Patient location during evaluation: Endoscopy Anesthesia Type: General Level of consciousness: awake and alert Pain management: pain level controlled Vital Signs Assessment: post-procedure vital signs reviewed and stable Respiratory status: spontaneous breathing, nonlabored ventilation, respiratory function stable and patient connected to nasal cannula oxygen Cardiovascular status: blood pressure returned to baseline and stable Postop Assessment: no apparent nausea or vomiting Anesthetic complications: no   No notable events documented.   Last Vitals:  Vitals:   01/08/23 1239 01/08/23 1250  BP: (!) 104/54 118/73  Pulse: 69 70  Resp: 20 16  Temp:    SpO2: 99% 100%    Last Pain:  Vitals:   01/08/23 1239  TempSrc:   PainSc: 0-No pain                 Cleda Mccreedy Meoshia Billing

## 2023-01-08 NOTE — Discharge Summary (Signed)
[facility] Discharge Summary   Patient: Amber Livingston ZOX:[id number]  PCP: Larae Grooms, NP  Date of admission: 01/05/2023   Date of discharge:  01/08/2023     Discharge Diagnoses:  Principal Problem:   Hyponatremia Active Problems:   Anxiety   Chronic pain syndrome   Fibromyalgia   Mixed hyperlipidemia   History of depression   Nicotine dependence, chewing tobacco, uncomplicated   Leukopenia   Other dysphagia   Generalized abdominal pain   Admitted From: Home Disposition:  Home   Recommendations for Outpatient Follow-up:  PCP: in 1 week, continue to monitor BP at home and take hydralazine 50 mg p.o. 3 times daily as needed if systolic BP greater than 140 mmHg Follow up LABS/TEST: CBC in 1 to 2 weeks   Follow-up Information     Larae Grooms, NP Follow up in 1 week(s).   Specialty: Nurse Practitioner Contact information: [address] [place] [address] [phone]                Diet recommendation: Cardiac diet  Activity: The patient is advised to gradually reintroduce usual activities, as tolerated  Discharge Condition: stable  Code Status: Full code   History of present illness: As per the H and P dictated on admission Hospital Course:  Mr. Haja Flora is a 62 year old female with history of anxiety, depression, GERD, history of thrush in early May 2024, GERD, aortic atherosclerosis, hypertension, who presents emergency department for chief concerns of elevated hypertension and concerns for a yeast infection that has taken over her body.  ED workup: Hyponatremia sodium 123,Serum osmolality 256 Urine osmolality 79 UDS positive for marijuana CT head: No acute intracranial abnormality.  CXR: Emphysema. No acute airspace disease.  AXR: Negative [facility] hospitalist consulted for admission and further management as below   Assessment and Plan: # Hypotonic hyponatremia, serum osmolality 256.  Sodium 123--133 improved. Patient was advised to  continue fluid striction 1.5L per day.  # Patient was concerned about esophageal candidiasis, requested GI consult GI consulted, for EGD and colonoscopy, patient took 2 days for GI prep. S/p colonoscopy, patient was poor, entire colon is normal. S/p EGD normal, multiple biopsies taken.  After the procedure patient was cleared by GI to follow-up as an outpatient.  Blood culture NGTD # Anxiety, Continue diazepam 5 mg IV IV every 6 hours as needed for anxiety 5/20 patient was very hyperactive, anxious and agitated, Haldol as needed order placed Psych consulted recommended no need of inpatient psychiatric hospitalization, started hydroxyzine as needed.  Patient was advised to follow-up as an outpatient. # Hypertension, blood pressure is elevated could be secondary to anxiety. S/p Amlodipine 5 mg p.o. daily given during hospital stay.  Blood pressure seems soft.  So amlodipine was discontinued and started on hydralazine 50 mg p.o. 3 times daily as needed if systolic BP greater than 140 mmHg.  Patient was advised to monitor BP at home and follow with PCP for further management. # Leukopenia: Patient's baseline WBC in the last year has been 3.4-4.7.  WBC count dropped 2.8.  Patient was advised to follow with PCP to repeat CBC after 1 to 2 weeks. # History of COPD/emphysema, resumed inhalers.  No exacerbation noticed on exam.   # Nicotine dependence, chewing tobacco, s/p nicotine patch prn.  Smoking cessation counseling done. # Fibromyalgia, Resumed home duloxetine 40 mg daily # Marijuana use disorder.  UDS positive for marijuana.  Patient is using chronically marijuana for chronic back pain and generalized pain due  to fibromyalgia.  Abstinence counseling done.  Body mass index is 17.93 kg/m.  Nutrition Interventions:   - Patient was instructed, not to drive, operate heavy machinery, perform activities at heights, swimming or participation in water activities or provide baby sitting services while on Pain,  Sleep and Anxiety Medications; until her outpatient Physician has advised to do so again.  - Also recommended to not to take more than prescribed Pain, Sleep and Anxiety Medications.  Patient was ambulatory without any assistance. On the day of the discharge the patient's vitals were stable, and no other acute medical condition were reported by patient. the patient was felt safe to be discharge at Home.  Consultants: GI and psychiatrist Procedures: EGD and colonoscopy  Discharge Exam: General: Appear in no distress, no Rash; Oral Mucosa Clear, moist. Cardiovascular: S1 and S2 Present, no Murmur, Respiratory: normal respiratory effort, Bilateral Air entry present and no Crackles, no wheezes Abdomen: Bowel Sound present, Soft and no tenderness, no hernia Extremities: no Pedal edema, no calf tenderness Neurology: alert and oriented to time, place, and person affect appropriate.  Filed Weights   01/05/23 1255 01/05/23 2133  Weight: 50.8 kg 53.5 kg   Vitals:   01/08/23 1239 01/08/23 1250  BP: (!) 104/54 118/73  Pulse: 69 70  Resp: 20 16  Temp:    SpO2: 99% 100%    DISCHARGE MEDICATION: Allergies as of 01/08/2023       Reactions   Fentanyl Nausea And Vomiting   Gluten Meal    Due to Fibromyalgia   Pravastatin    "Anxiety and feels like my body is going to shutdown..feels like heart is going to fly out of my body"   Rosuvastatin    Fatigue and nausea   Zetia [ezetimibe]    Nausea        Medication List     STOP taking these medications    fluconazole 150 MG tablet Commonly known as: DIFLUCAN   MAGNESIUM BISGLYCINATE PO   mometasone 0.1 % ointment Commonly known as: ELOCON   NON FORMULARY   QC TUMERIC COMPLEX PO   triamcinolone cream 0.1 % Commonly known as: KENALOG       TAKE these medications    albuterol (2.5 MG/3ML) 0.083% nebulizer solution Commonly known as: PROVENTIL Take 3 mLs (2.5 mg total) by nebulization every 6 (six) hours as needed for  wheezing or shortness of breath.   BEE POLLEN PO Take 1 capsule by mouth daily.   diphenhydramine-acetaminophen 25-500 MG Tabs tablet Commonly known as: TYLENOL PM Take 2 tablets by mouth at bedtime.   DULoxetine 20 MG capsule Commonly known as: CYMBALTA TAKE 2 CAPSULES BY MOUTH EVERY DAY   estradiol 0.5 MG tablet Commonly known as: ESTRACE TAKE 1 TABLET BY MOUTH ONCE DAILY   hydrALAZINE 50 MG tablet Commonly known as: APRESOLINE Take 1 tablet (50 mg total) by mouth 3 (three) times daily as needed (if systolic Bp greater than 140 mmHg).   medroxyPROGESTERone 2.5 MG tablet Commonly known as: PROVERA TAKE 1 TABLET BY MOUTH ONCE DAILY   meloxicam 15 MG tablet Commonly known as: MOBIC Take 1 tablet (15 mg total) by mouth daily as needed for pain. What changed: reasons to take this   nicotine polacrilex 4 MG lozenge Commonly known as: COMMIT Take 4 mg by mouth as needed for smoking cessation.   pantoprazole 40 MG tablet Commonly known as: PROTONIX Take 1 tablet (40 mg total) by mouth daily as needed.   Pulmicort Flexhaler 180  MCG/ACT inhaler Generic drug: budesonide Inhale 1 puff into the lungs in the morning and at bedtime.   Repatha SureClick 140 MG/ML Soaj Generic drug: Evolocumab INJECT 1 PEN. INTO THE SKIN EVERY 14 (FOURTEEN) DAYS.   Stiolto Respimat 2.5-2.5 MCG/ACT Aers Generic drug: Tiotropium Bromide-Olodaterol Inhale 2 puffs into the lungs once daily at 2 PM.       Allergies  Allergen Reactions   Fentanyl Nausea And Vomiting   Gluten Meal     Due to Fibromyalgia   Pravastatin     "Anxiety and feels like my body is going to shutdown..feels like heart is going to fly out of my body"   Rosuvastatin     Fatigue and nausea   Zetia [Ezetimibe]     Nausea   Discharge Instructions     Call MD for:  difficulty breathing, headache or visual disturbances   Complete by: As directed    Call MD for:  extreme fatigue   Complete by: As directed    Call MD  for:  persistant dizziness or light-headedness   Complete by: As directed    Call MD for:  persistant nausea and vomiting   Complete by: As directed    Call MD for:  severe uncontrolled pain   Complete by: As directed    Call MD for:  temperature >100.4   Complete by: As directed    Diet - low sodium heart healthy   Complete by: As directed    Discharge instructions   Complete by: As directed    Follow with PCP in 1 week, continue to monitor BP at home and take hydralazine 50 mg p.o. 3 times daily as needed if systolic BP greater than 140 mmHg.   Increase activity slowly   Complete by: As directed        The results of significant diagnostics from this hospitalization (including imaging, microbiology, ancillary and laboratory) are listed below for reference.    Significant Diagnostic Studies: CT Head Wo Contrast  Result Date: 01/05/2023 CLINICAL DATA:  62 year old female with altered mental status. EXAM: CT HEAD WITHOUT CONTRAST TECHNIQUE: Contiguous axial images were obtained from the base of the skull through the vertex without intravenous contrast. RADIATION DOSE REDUCTION: This exam was performed according to the departmental dose-optimization program which includes automated exposure control, adjustment of the mA and/or kV according to patient size and/or use of iterative reconstruction technique. COMPARISON:  02/08/2022 MR FINDINGS: Brain: No evidence of acute infarction, hemorrhage, hydrocephalus, extra-axial collection or mass lesion/mass effect. Vascular: No hyperdense vessel or unexpected calcification. Skull: Normal. Negative for fracture or focal lesion. Sinuses/Orbits: No acute finding. Other: None. IMPRESSION: No acute intracranial abnormality. Electronically Signed   By: Harmon Pier M.D.   On: 01/05/2023 16:29   DG Chest 2 View  Result Date: 01/05/2023 CLINICAL DATA:  Hyponatremia, hypertension EXAM: CHEST - 2 VIEW COMPARISON:  05/13/2022 FINDINGS: Frontal and lateral views  of the chest demonstrate an unremarkable cardiac silhouette. Background emphysema without acute airspace disease, effusion, or pneumothorax. No acute bony abnormalities. IMPRESSION: 1. Emphysema.  No acute airspace disease. Electronically Signed   By: Sharlet Salina M.D.   On: 01/05/2023 16:05   DG Abdomen 1 View  Result Date: 01/05/2023 CLINICAL DATA:  Constipation. EXAM: ABDOMEN - 1 VIEW COMPARISON:  None Available. FINDINGS: The bowel gas pattern is normal. No suspicious calcifications or other significant radiographic abnormality are seen. IMPRESSION: Negative. Electronically Signed   By: Harmon Pier M.D.   On: 01/05/2023 16:04  Microbiology: Recent Results (from the past 240 hour(s))  Culture, blood (Routine X 2) w Reflex to ID Panel     Status: None (Preliminary result)   Collection Time: 01/06/23  3:05 PM   Specimen: BLOOD  Result Value Ref Range Status   Specimen Description BLOOD BLOOD LEFT ARM  Final   Special Requests   Final    BOTTLES DRAWN AEROBIC AND ANAEROBIC Blood Culture adequate volume   Culture   Final    NO GROWTH 2 DAYS Performed at [facility], [address] [place], [place] [address]    Report Status PENDING  Incomplete  Culture, blood (Routine X 2) w Reflex to ID Panel     Status: None (Preliminary result)   Collection Time: 01/06/23  3:14 PM   Specimen: BLOOD  Result Value Ref Range Status   Specimen Description BLOOD BLOOD LEFT ARM  Final   Special Requests   Final    BOTTLES DRAWN AEROBIC AND ANAEROBIC Blood Culture adequate volume   Culture   Final    NO GROWTH 2 DAYS Performed at [facility], [address] [place], [place] [address]    Report Status PENDING  Incomplete     Labs: CBC: Recent Labs  Lab 01/05/23 1310 01/06/23 0503 01/07/23 0356  WBC 3.6* 3.4* 2.8*  HGB 12.5 12.9 12.3  HCT 36.2 36.9 35.1*  MCV 92.3 91.3 90.9  PLT 274 267 239   Basic Metabolic Panel: Recent Labs  Lab 01/05/23 1310 01/06/23 0503  01/07/23 0356  NA 123* 134* 133*  K 4.0 4.3 4.1  CL 91* 105 103  CO2 23 23 23   GLUCOSE 86 88 93  BUN 6* 9 9  CREATININE 0.62 0.73 0.58  CALCIUM 8.8* 8.9 8.7*  MG 2.1  --  2.0  PHOS  --   --  4.4   Liver Function Tests: Recent Labs  Lab 01/05/23 1310  AST 22  ALT 26  ALKPHOS 50  BILITOT 0.7  PROT 7.2  ALBUMIN 4.8   No results for input(s): "LIPASE", "AMYLASE" in the last 168 hours. No results for input(s): "AMMONIA" in the last 168 hours. Cardiac Enzymes: No results for input(s): "CKTOTAL", "CKMB", "CKMBINDEX", "TROPONINI" in the last 168 hours. BNP (last 3 results) No results for input(s): "BNP" in the last 8760 hours. CBG: No results for input(s): "GLUCAP" in the last 168 hours.  Time spent: 35 minutes  Signed:  Gillis Santa  [facility] 01/08/2023 1:29 PM
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
## 2023-01-08 NOTE — Plan of Care (Signed)
IV removed, discharge instructions reviewed, and patient discharged to home

## 2023-01-08 NOTE — Transfer of Care (Signed)
Immediate Anesthesia Transfer of Care Note  Patient: Amber Livingston  Procedure(s) Performed: ESOPHAGOGASTRODUODENOSCOPY (EGD) WITH PROPOFOL COLONOSCOPY WITH PROPOFOL  Patient Location: PACU  Anesthesia Type:General  Level of Consciousness: awake, alert , and oriented  Airway & Oxygen Therapy: Patient Spontanous Breathing  Post-op Assessment: Report given to RN and Post -op Vital signs reviewed and stable  Post vital signs: Reviewed and stable  Last Vitals:  Vitals Value Taken Time  BP 99/62 01/08/23 1230  Temp 36.3 C 01/08/23 1229  Pulse 65 01/08/23 1232  Resp 18 01/08/23 1232  SpO2 100 % 01/08/23 1232  Vitals shown include unvalidated device data.  Last Pain:  Vitals:   01/08/23 1229  TempSrc: Temporal  PainSc: Asleep         Complications: No notable events documented.

## 2023-01-08 NOTE — Op Note (Signed)
[facility] Patient Name: Amber Livingston Procedure Date: 01/08/2023 11:55 AM MRN: [id number] Account #: [id number] Date of Birth: [birth year] Admit Type: Outpatient Age: 63 Room: [facility] ENDO ROOM 2 Gender: Female Note Status: Finalized Instrument Name: Laurette Schimke [id number] Procedure:             Upper GI endoscopy Indications:           Generalized abdominal pain, Nausea Providers:             Toney Reil MD, MD Medicines:             See the Anesthesia note for documentation of the                         administered medications, General Anesthesia Complications:         No immediate complications. Estimated blood loss: None. Procedure:             Pre-Anesthesia Assessment:                        - Prior to the procedure, a History and Physical was                         performed, and patient medications and allergies were                         reviewed. The patient is competent. The risks and                         benefits of the procedure and the sedation options and                         risks were discussed with the patient. All questions                         were answered and informed consent was obtained.                         Patient identification and proposed procedure were                         verified by the physician, the nurse, the                         anesthesiologist, the anesthetist and the technician                         in the pre-procedure area in the procedure room in the                         endoscopy suite. Mental Status Examination: alert and                         oriented. Airway Examination: normal oropharyngeal                         airway and neck mobility. Respiratory Examination:  clear to auscultation. CV Examination: normal.                         Prophylactic Antibiotics: The patient does not require                         prophylactic antibiotics. Prior  Anticoagulants: The                         patient has taken no anticoagulant or antiplatelet                         agents. ASA Grade Assessment: III - A patient with                         severe systemic disease. After reviewing the risks and                         benefits, the patient was deemed in satisfactory                         condition to undergo the procedure. The anesthesia                         plan was to use general anesthesia. Immediately prior                         to administration of medications, the patient was                         re-assessed for adequacy to receive sedatives. The                         heart rate, respiratory rate, oxygen saturations,                         blood pressure, adequacy of pulmonary ventilation, and                         response to care were monitored throughout the                         procedure. The physical status of the patient was                         re-assessed after the procedure.                        After obtaining informed consent, the endoscope was                         passed under direct vision. Throughout the procedure,                         the patient's blood pressure, pulse, and oxygen                         saturations were monitored continuously. The Endoscope  was introduced through the mouth, and advanced to the                         second part of duodenum. The upper GI endoscopy was                         accomplished without difficulty. The patient tolerated                         the procedure well. Findings:      The duodenal bulb and second portion of the duodenum were normal.       Biopsies were taken with a cold forceps for histology.      The entire examined stomach was normal. Biopsies were taken with a cold       forceps for histology.      The cardia and gastric fundus were normal on retroflexion.      The gastroesophageal junction and examined  esophagus were normal. Impression:            - Normal duodenal bulb and second portion of the                         duodenum. Biopsied.                        - Normal stomach. Biopsied.                        - Normal gastroesophageal junction and esophagus. Recommendation:        - Await pathology results.                        - Proceed with colonoscopy as scheduled                        See colonoscopy report Procedure Code(s):     --- Professional ---                        [phone], Esophagogastroduodenoscopy, flexible,                         transoral; with biopsy, single or multiple Diagnosis Code(s):     --- Professional ---                        R10.84, Generalized abdominal pain                        R11.0, Nausea CPT copyright 2022 [employer]. All rights reserved. The codes documented in this report are preliminary and upon coder review may  be revised to meet current compliance requirements. Dr. Libby Maw Toney Reil MD, MD 01/08/2023 12:09:34 PM This report has been signed electronically. Number of Addenda: 0 Note Initiated On: 01/08/2023 11:55 AM Estimated Blood Loss:  Estimated blood loss: none.      [facility]

## 2023-01-09 ENCOUNTER — Telehealth: Payer: Self-pay

## 2023-01-09 ENCOUNTER — Encounter: Payer: Self-pay | Admitting: Gastroenterology

## 2023-01-09 LAB — CULTURE, BLOOD (ROUTINE X 2): Culture: NO GROWTH

## 2023-01-09 NOTE — Transitions of Care (Post Inpatient/ED Visit) (Signed)
01/09/2023  Name: Amber Livingston MRN: [id number] DOB: 07/11/1961  Today's TOC FU Call Status: Today's TOC FU Call Status:: Successful TOC FU Call Competed TOC FU Call Complete Date: 01/09/23  Transition Care Management Follow-up Telephone Call Date of Discharge: 01/08/23 Discharge Facility: [facility]) Type of Discharge: Inpatient Admission Primary Inpatient Discharge Diagnosis:: hypo-osolality How have you been since you were released from the hospital?: Same Any questions or concerns?: No  Items Reviewed: Did you receive and understand the discharge instructions provided?: Yes Medications obtained,verified, and reconciled?: Yes (Medications Reviewed) Any new allergies since your discharge?: No Dietary orders reviewed?: Yes Do you have support at home?: No  Medications Reviewed Today: Medications Reviewed Today     Reviewed by Karena Addison, LPN (Licensed Practical Nurse) on 01/09/23 at [phone]  Med List Status: <None>   Medication Order Taking? Sig Documenting Provider Last Dose Status Informant  albuterol (PROVENTIL) (2.5 MG/3ML) 0.083% nebulizer solution [id number] Yes Take 3 mLs (2.5 mg total) by nebulization every 6 (six) hours as needed for wheezing or shortness of breath. Luciano Cutter, MD Taking Active   BEE POLLEN PO [id number] Yes Take 1 capsule by mouth daily. [provider] Taking Active   budesonide (PULMICORT FLEXHALER) 180 MCG/ACT inhaler [id number] Yes Inhale 1 puff into the lungs in the morning and at bedtime. Luciano Cutter, MD Taking Active   diphenhydramine-acetaminophen (TYLENOL PM) 25-500 MG TABS tablet [id number] No Take 2 tablets by mouth at bedtime.  Patient not taking: Reported on 01/09/2023   [provider] Not Taking Active   DULoxetine (CYMBALTA) 20 MG capsule [id number] Yes TAKE 2 CAPSULES BY MOUTH EVERY DAY Ocie Doyne, MD Taking Active   estradiol (ESTRACE) 0.5 MG tablet [id number] Yes TAKE 1 TABLET  BY MOUTH ONCE DAILY Linzie Collin, MD Taking Active   Evolocumab [facility] SURECLICK) 140 MG/ML SOAJ [id number] Yes INJECT 1 PEN. INTO THE SKIN EVERY 14 (FOURTEEN) DAYS. Christell Constant, MD Taking Active   hydrALAZINE (APRESOLINE) 50 MG tablet [id number] Yes Take 1 tablet (50 mg total) by mouth 3 (three) times daily as needed (if systolic Bp greater than 140 mmHg). Gillis Santa, MD Taking Active   medroxyPROGESTERone (PROVERA) 2.5 MG tablet [id number] No TAKE 1 TABLET BY MOUTH ONCE DAILY  Patient not taking: Reported on 01/09/2023   Linzie Collin, MD Not Taking Active   meloxicam [facility]) 15 MG tablet [id number] Yes Take 1 tablet (15 mg total) by mouth daily as needed for pain.  Patient taking differently: Take 15 mg by mouth daily as needed for pain (carpel tunnel pain (hand surgery on right wrist)).   Larae Grooms, NP Taking Active   nicotine polacrilex (COMMIT) 4 MG lozenge [id number] No Take 4 mg by mouth as needed for smoking cessation.  Patient not taking: Reported on 01/09/2023   [provider] Not Taking Active Self           Med Note Henreitta Leber, JACQUELINE L   Tue Dec 18, 2021  8:25 AM)    pantoprazole (PROTONIX) 40 MG tablet [id number] No Take 1 tablet (40 mg total) by mouth daily as needed.  Patient not taking: Reported on 01/09/2023   Larae Grooms, NP Not Taking Active   Tiotropium Bromide-Olodaterol (STIOLTO RESPIMAT) 2.5-2.5 MCG/ACT AERS [id number] Yes Inhale 2 puffs into the lungs once daily at 2 PM. Luciano Cutter, MD Taking Active             Home Care and Equipment/Supplies: Were Home  Health Services Ordered?: NA Any new equipment or medical supplies ordered?: NA  Functional Questionnaire: Do you need assistance with bathing/showering or dressing?: No Do you need assistance with meal preparation?: No Do you need assistance with eating?: No Do you have difficulty maintaining continence: No Do you need assistance with getting out of  bed/getting out of a chair/moving?: No Do you have difficulty managing or taking your medications?: No  Follow up appointments reviewed: PCP Follow-up appointment confirmed?: Yes Date of PCP follow-up appointment?: 01/16/23 Follow-up Provider: Larae Grooms Specialist [facility] Follow-up appointment confirmed?: NA Do you need transportation to your follow-up appointment?: No Do you understand care options if your condition(s) worsen?: Yes-patient verbalized understanding    SIGNATURE Karena Addison, LPN [facility] Nurse Health Advisor Direct Dial [phone]

## 2023-01-11 LAB — CULTURE, BLOOD (ROUTINE X 2)

## 2023-01-14 ENCOUNTER — Encounter: Payer: Self-pay | Admitting: Gastroenterology

## 2023-01-14 LAB — SURGICAL PATHOLOGY
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
## 2023-01-15 ENCOUNTER — Ambulatory Visit: Payer: Medicaid Other | Admitting: Pain Medicine

## 2023-01-16 ENCOUNTER — Inpatient Hospital Stay: Payer: Medicaid Other | Admitting: Family Medicine

## 2023-01-17 ENCOUNTER — Ambulatory Visit: Payer: Medicaid Other | Admitting: Family Medicine

## 2023-01-17 ENCOUNTER — Encounter: Payer: Self-pay | Admitting: Family Medicine

## 2023-01-17 VITALS — BP 130/77 | HR 88 | Temp 98.0°F | Wt 114.4 lb

## 2023-01-17 DIAGNOSIS — R09A2 Foreign body sensation, throat: Secondary | ICD-10-CM | POA: Diagnosis not present

## 2023-01-17 DIAGNOSIS — F419 Anxiety disorder, unspecified: Secondary | ICD-10-CM | POA: Diagnosis not present

## 2023-01-17 DIAGNOSIS — E871 Hypo-osmolality and hyponatremia: Secondary | ICD-10-CM

## 2023-01-17 DIAGNOSIS — R7989 Other specified abnormal findings of blood chemistry: Secondary | ICD-10-CM

## 2023-01-17 MED ORDER — QUETIAPINE FUMARATE 25 MG PO TABS
25.0000 mg | ORAL_TABLET | Freq: Every day | ORAL | 0 refills | Status: DC
Start: 2023-01-17 — End: 2023-02-10

## 2023-01-17 NOTE — Assessment & Plan Note (Signed)
Active. Stable. Seroquel 25mg  at bedtime ordered today to help with anxiety. F/u in 2 weeks for this.

## 2023-01-17 NOTE — Assessment & Plan Note (Signed)
Acute, ongoing. CMP done today to recheck Na and Ca levels. Hospital recommends 1.5L fluid restriction.

## 2023-01-17 NOTE — Progress Notes (Signed)
BP 130/77   Pulse 88   Temp 98 F (36.7 C) (Oral)   Wt 114 lb 6.4 oz (51.9 kg)   SpO2 99%   BMI 17.39 kg/m    Subjective:    Patient ID: Amber Livingston, female    DOB: Apr 23, 1961, 62 y.o.   MRN: [id number]  HPI: Amber Livingston is a 62 y.o. female  Chief Complaint  Patient presents with   Hospitalization Follow-up    Pt states she just feels bad. States she has felt bad since she had the thrush. "I feel like I'm dying"    Patient is here for hospital follow up for hyponatremia (133) and hypocalcemia (8.8). She does admit to taking a Mg supplement daily, and is not able to eat dairy or citrus fruits due to oral irritation. She admits to drinking 8 water bottles daily, however hospital advised 1.5L/ day fluid restriction for hyponatremia.  She states she is not feeling better since her hospitalization. She has complaints that her oral thrush is starting to come back, she does admit the x2 doses of Diflucan helped make it better. Results form upper endoscopy on 01/08/23 were normal and biopsies were obtained. She also complains of constant nausea, she is juicing her vegetables due to difficulty swallowing and irritation from oral thrush.   She has history of anxiety, currently taking Cymbalta 40 mg daily, not currently receiving counseling but has in the past, denies the need for this referral today. She appears very anxious today about her care. She is willing to try additional medication for her anxiety today.   She has follow up appointments scheduled with Cardiology, 01/21/2023 and Pulmonary on 01/28/23  Relevant past medical, surgical, family and social history reviewed and updated as indicated. Interim medical history since our last visit reviewed. Allergies and medications reviewed and updated.  Review of Systems  Constitutional:  Positive for fatigue.  Respiratory: Negative.    Cardiovascular: Negative.   Gastrointestinal:  Positive for nausea.  Neurological:  Positive for headaches.   Psychiatric/Behavioral:  The patient is nervous/anxious.     Per HPI unless specifically indicated above     Objective:    BP 130/77   Pulse 88   Temp 98 F (36.7 C) (Oral)   Wt 114 lb 6.4 oz (51.9 kg)   SpO2 99%   BMI 17.39 kg/m   Wt Readings from Last 3 Encounters:  01/17/23 114 lb 6.4 oz (51.9 kg)  01/05/23 117 lb 15.1 oz (53.5 kg)  12/25/22 116 lb 6.4 oz (52.8 kg)    Physical Exam Vitals and nursing note reviewed.  Constitutional:      General: She is awake. She is not in acute distress.    Appearance: Normal appearance. She is well-developed and well-groomed. She is not ill-appearing.  HENT:     Head: Normocephalic and atraumatic.     Right Ear: Hearing and external ear normal. No drainage.     Left Ear: Hearing and external ear normal. No drainage.     Nose: Nose normal.     Mouth/Throat:     Mouth: Mucous membranes are moist.  Eyes:     General: Lids are normal.        Right eye: No discharge.        Left eye: No discharge.     Conjunctiva/sclera: Conjunctivae normal.  Cardiovascular:     Rate and Rhythm: Normal rate and regular rhythm.     Heart sounds: Normal heart sounds, S1 normal and S2  normal. No murmur heard.    No gallop.  Pulmonary:     Effort: Pulmonary effort is normal. No accessory muscle usage or respiratory distress.     Breath sounds: Normal breath sounds.  Musculoskeletal:        General: Normal range of motion.     Cervical back: Full passive range of motion without pain and normal range of motion.     Right lower leg: No edema.     Left lower leg: No edema.  Skin:    General: Skin is warm and dry.     Capillary Refill: Capillary refill takes less than 2 seconds.  Neurological:     Mental Status: She is alert and oriented to person, place, and time.  Psychiatric:        Attention and Perception: Attention normal.        Mood and Affect: Mood normal.        Speech: Speech normal.        Behavior: Behavior normal. Behavior is  cooperative.        Thought Content: Thought content normal.     Results for orders placed or performed during the hospital encounter of 01/05/23  Culture, blood (Routine X 2) w Reflex to ID Panel   Specimen: BLOOD  Result Value Ref Range   Specimen Description BLOOD BLOOD LEFT ARM    Special Requests      BOTTLES DRAWN AEROBIC AND ANAEROBIC Blood Culture adequate volume   Culture      NO GROWTH 5 DAYS Performed at [facility], [address] [place], [place] [address]    Report Status 01/11/2023 FINAL   Culture, blood (Routine X 2) w Reflex to ID Panel   Specimen: BLOOD  Result Value Ref Range   Specimen Description BLOOD BLOOD LEFT ARM    Special Requests      BOTTLES DRAWN AEROBIC AND ANAEROBIC Blood Culture adequate volume   Culture      NO GROWTH 5 DAYS Performed at [facility], [address] [place], [place] [address]    Report Status 01/11/2023 FINAL   Comprehensive metabolic panel  Result Value Ref Range   Sodium 123 (L) 135 - 145 mmol/L   Potassium 4.0 3.5 - 5.1 mmol/L   Chloride 91 (L) 98 - 111 mmol/L   CO2 23 22 - 32 mmol/L   Glucose, Bld 86 70 - 99 mg/dL   BUN 6 (L) 8 - 23 mg/dL   Creatinine, Ser 0.98 0.44 - 1.00 mg/dL   Calcium 8.8 (L) 8.9 - 10.3 mg/dL   Total Protein 7.2 6.5 - 8.1 g/dL   Albumin 4.8 3.5 - 5.0 g/dL   AST 22 15 - 41 U/L   ALT 26 0 - 44 U/L   Alkaline Phosphatase 50 38 - 126 U/L   Total Bilirubin 0.7 0.3 - 1.2 mg/dL   GFR, Estimated >11 >91 mL/min   Anion gap 9 5 - 15  Ethanol  Result Value Ref Range   Alcohol, Ethyl (B) <10 <10 mg/dL  Salicylate level  Result Value Ref Range   Salicylate Lvl <7.0 (L) 7.0 - 30.0 mg/dL  Acetaminophen level  Result Value Ref Range   Acetaminophen (Tylenol), Serum <10 (L) 10 - 30 ug/mL  cbc  Result Value Ref Range   WBC 3.6 (L) 4.0 - 10.5 K/uL   RBC 3.92 3.87 - 5.11 MIL/uL   Hemoglobin 12.5 12.0 - 15.0 g/dL   HCT 47.8 29.5 - 62.1 %  MCV 92.3 80.0 - 100.0 fL   MCH 31.9  26.0 - 34.0 pg   MCHC 34.5 30.0 - 36.0 g/dL   RDW 96.0 45.4 - 09.8 %   Platelets 274 150 - 400 K/uL   nRBC 0.0 0.0 - 0.2 %  Urine Drug Screen, Qualitative  Result Value Ref Range   Tricyclic, Ur Screen NONE DETECTED NONE DETECTED   Amphetamines, Ur Screen NONE DETECTED NONE DETECTED   MDMA (Ecstasy)Ur Screen NONE DETECTED NONE DETECTED   Cocaine Metabolite,Ur [place] NONE DETECTED NONE DETECTED   Opiate, Ur Screen NONE DETECTED NONE DETECTED   Phencyclidine (PCP) Ur S NONE DETECTED NONE DETECTED   Cannabinoid 50 Ng, Ur [place] POSITIVE (A) NONE DETECTED   Barbiturates, Ur Screen NONE DETECTED NONE DETECTED   Benzodiazepine, Ur Scrn NONE DETECTED NONE DETECTED   Methadone Scn, Ur NONE DETECTED NONE DETECTED  TSH  Result Value Ref Range   TSH 1.285 0.350 - 4.500 uIU/mL  T4, free  Result Value Ref Range   Free T4 0.64 0.61 - 1.12 ng/dL  Magnesium  Result Value Ref Range   Magnesium 2.1 1.7 - 2.4 mg/dL  Basic metabolic panel  Result Value Ref Range   Sodium 134 (L) 135 - 145 mmol/L   Potassium 4.3 3.5 - 5.1 mmol/L   Chloride 105 98 - 111 mmol/L   CO2 23 22 - 32 mmol/L   Glucose, Bld 88 70 - 99 mg/dL   BUN 9 8 - 23 mg/dL   Creatinine, Ser 1.19 0.44 - 1.00 mg/dL   Calcium 8.9 8.9 - 14.7 mg/dL   GFR, Estimated >82 >95 mL/min   Anion gap 6 5 - 15  CBC  Result Value Ref Range   WBC 3.4 (L) 4.0 - 10.5 K/uL   RBC 4.04 3.87 - 5.11 MIL/uL   Hemoglobin 12.9 12.0 - 15.0 g/dL   HCT 62.1 30.8 - 65.7 %   MCV 91.3 80.0 - 100.0 fL   MCH 31.9 26.0 - 34.0 pg   MCHC 35.0 30.0 - 36.0 g/dL   RDW 84.6 96.2 - 95.2 %   Platelets 267 150 - 400 K/uL   nRBC 0.0 0.0 - 0.2 %  Osmolality, urine  Result Value Ref Range   Osmolality, Ur 79 (L) 300 - 900 mOsm/kg  Osmolality  Result Value Ref Range   Osmolality 256 (L) 275 - 295 mOsm/kg  Sodium, urine, random  Result Value Ref Range   Sodium, Ur 11 mmol/L  HIV Antibody (routine testing w rflx)  Result Value Ref Range   HIV Screen 4th Generation wRfx Non  Reactive Non Reactive  CBC  Result Value Ref Range   WBC 2.8 (L) 4.0 - 10.5 K/uL   RBC 3.86 (L) 3.87 - 5.11 MIL/uL   Hemoglobin 12.3 12.0 - 15.0 g/dL   HCT 84.1 (L) 32.4 - 40.1 %   MCV 90.9 80.0 - 100.0 fL   MCH 31.9 26.0 - 34.0 pg   MCHC 35.0 30.0 - 36.0 g/dL   RDW 02.7 25.3 - 66.4 %   Platelets 239 150 - 400 K/uL   nRBC 0.0 0.0 - 0.2 %  Basic metabolic panel  Result Value Ref Range   Sodium 133 (L) 135 - 145 mmol/L   Potassium 4.1 3.5 - 5.1 mmol/L   Chloride 103 98 - 111 mmol/L   CO2 23 22 - 32 mmol/L   Glucose, Bld 93 70 - 99 mg/dL   BUN 9 8 - 23 mg/dL  Creatinine, Ser 0.58 0.44 - 1.00 mg/dL   Calcium 8.7 (L) 8.9 - 10.3 mg/dL   GFR, Estimated >16 >10 mL/min   Anion gap 7 5 - 15  Phosphorus  Result Value Ref Range   Phosphorus 4.4 2.5 - 4.6 mg/dL  Magnesium  Result Value Ref Range   Magnesium 2.0 1.7 - 2.4 mg/dL  Surgical pathology  Result Value Ref Range   SURGICAL PATHOLOGY      SURGICAL PATHOLOGY CASE: [id number] PATIENT: Jamal Senner Surgical Pathology Report     Specimen Submitted: A. Duodenum; cbx B. Stomach; cbx  Clinical History: Dysphagia, abdominal pain      DIAGNOSIS: A.  DUODENUM; COLD BIOPSY: - FOCAL MILD REACTIVE DUODENITIS, NON-SPECIFIC. - NEGATIVE FOR DYSPLASIA AND MALIGNANCY.  B.  STOMACH; COLD BIOPSY: - UNREMARKABLE ANTRAL AND OXYNTIC MUCOSA. - NEGATIVE FOR H. PYLORI, INTESTINAL METAPLASIA, DYSPLASIA, AND MALIGNANCY.  GROSS DESCRIPTION: A. Labeled: cbx duodenum for abdominal pain Received: Formalin Collection time: 12:03 PM on 01/08/2023 Placed into formalin time: 12:03 PM on 01/08/2023 Tissue fragment(s): Multiple Size: Aggregate, 1.1 x 0.5 x 0.1 cm Description: Tan soft tissue fragments Entirely submitted in 1 cassette.  B. Labeled: cbx gastric for abdominal pain Received: Formalin Collection time: 12:06 PM on 01/08/2023 Placed into formalin time: 12:06 PM on 01/08/2023 Tissue fragment(s): 2 Size: Each  0.5  cm Description: Tan soft tissue fragments Entirely submitted in 1 cassette.  RB 01/08/2023  Final Diagnosis performed by Elijah Birk, MD.   Electronically signed 01/14/2023 3:17:58PM The electronic signature indicates that the named Attending Pathologist has evaluated the specimen Technical component performed at [facility], [address], [place], [place] [address] Lab: [phone] Dir: Jolene Schimke, MD, MMM  Professional component performed at [facility], [facility], [address] [place], [place], [place] [address] Lab: [phone] Dir: Beryle Quant, MD       Assessment & Plan:   Problem List Items Addressed This Visit     Anxiety    Active. Stable. Seroquel 25mg  at bedtime ordered today to help with anxiety. F/u in 2 weeks for this.       Relevant Medications   QUEtiapine (SEROQUEL) 25 MG tablet   Hyponatremia - Primary    Acute, ongoing. CMP done today to recheck Na and Ca levels. Hospital recommends 1.5L fluid restriction.       Relevant Orders   Comp Met (CMET)   Other Visit Diagnoses     Hypocalcemia       Acute, stable. CMP rechecked today.   Relevant Orders   Comp Met (CMET)   Globus sensation       Chronic. stable. Referral made to speech for possible barium swallow.   Relevant Orders   Ambulatory referral to Speech Therapy   Abnormal CBC       Acute, ongoing. Repeat CBC from ED to check low WBC, HCT, and RBC. Will treat as needed.   Relevant Orders   CBC With Diff/Platelet        Follow up plan: Return in 2 weeks (on 01/31/2023), or if symptoms worsen or fail to improve, for Anxiety.

## 2023-01-18 LAB — COMPREHENSIVE METABOLIC PANEL
ALT: 29 IU/L (ref 0–32)
AST: 26 IU/L (ref 0–40)
Albumin/Globulin Ratio: 2.4 — ABNORMAL HIGH (ref 1.2–2.2)
Albumin: 5.2 g/dL — ABNORMAL HIGH (ref 3.9–4.9)
Alkaline Phosphatase: 60 IU/L (ref 44–121)
BUN/Creatinine Ratio: 6 — ABNORMAL LOW (ref 12–28)
BUN: 4 mg/dL — ABNORMAL LOW (ref 8–27)
Bilirubin Total: 0.4 mg/dL (ref 0.0–1.2)
CO2: 19 mmol/L — ABNORMAL LOW (ref 20–29)
Calcium: 10 mg/dL (ref 8.7–10.3)
Chloride: 91 mmol/L — ABNORMAL LOW (ref 96–106)
Creatinine, Ser: 0.65 mg/dL (ref 0.57–1.00)
Globulin, Total: 2.2 g/dL (ref 1.5–4.5)
Glucose: 92 mg/dL (ref 70–99)
Potassium: 4.8 mmol/L (ref 3.5–5.2)
Sodium: 129 mmol/L — ABNORMAL LOW (ref 134–144)
Total Protein: 7.4 g/dL (ref 6.0–8.5)
eGFR: 99 mL/min/{1.73_m2} (ref >59)

## 2023-01-19 ENCOUNTER — Encounter: Payer: Self-pay | Admitting: Family Medicine

## 2023-01-19 DIAGNOSIS — S43101A Unspecified dislocation of right acromioclavicular joint, initial encounter: Secondary | ICD-10-CM

## 2023-01-19 DIAGNOSIS — R7989 Other specified abnormal findings of blood chemistry: Secondary | ICD-10-CM

## 2023-01-20 ENCOUNTER — Encounter: Payer: Self-pay | Admitting: *Deleted

## 2023-01-20 ENCOUNTER — Emergency Department
Admission: EM | Admit: 2023-01-20 | Discharge: 2023-01-20 | Disposition: A | Discharge status: Home / Self Care | Payer: Medicaid Other | Attending: Emergency Medicine | Admitting: Emergency Medicine

## 2023-01-20 ENCOUNTER — Encounter: Payer: Self-pay | Admitting: Nurse Practitioner

## 2023-01-20 ENCOUNTER — Emergency Department: Payer: Medicaid Other

## 2023-01-20 ENCOUNTER — Other Ambulatory Visit: Payer: Self-pay

## 2023-01-20 ENCOUNTER — Other Ambulatory Visit: Payer: Medicaid Other

## 2023-01-20 ENCOUNTER — Telehealth: Payer: Medicaid Other

## 2023-01-20 DIAGNOSIS — R531 Weakness: Secondary | ICD-10-CM | POA: Diagnosis not present

## 2023-01-20 DIAGNOSIS — G4486 Cervicogenic headache: Secondary | ICD-10-CM | POA: Diagnosis not present

## 2023-01-20 DIAGNOSIS — M797 Fibromyalgia: Secondary | ICD-10-CM | POA: Diagnosis not present

## 2023-01-20 DIAGNOSIS — R519 Headache, unspecified: Secondary | ICD-10-CM | POA: Diagnosis present

## 2023-01-20 DIAGNOSIS — R7989 Other specified abnormal findings of blood chemistry: Secondary | ICD-10-CM

## 2023-01-20 DIAGNOSIS — F17200 Nicotine dependence, unspecified, uncomplicated: Secondary | ICD-10-CM | POA: Diagnosis not present

## 2023-01-20 DIAGNOSIS — J439 Emphysema, unspecified: Secondary | ICD-10-CM | POA: Insufficient documentation

## 2023-01-20 DIAGNOSIS — G894 Chronic pain syndrome: Secondary | ICD-10-CM | POA: Insufficient documentation

## 2023-01-20 LAB — CBC WITH DIFFERENTIAL/PLATELET
Abs Immature Granulocytes: 0.01 K/uL (ref 0.00–0.07)
Basophils Absolute: 0.1 K/uL (ref 0.0–0.1)
Basophils Relative: 1 %
Eosinophils Absolute: 0 K/uL (ref 0.0–0.5)
Eosinophils Relative: 1 %
HCT: 36.2 % (ref 36.0–46.0)
Hemoglobin: 12.5 g/dL (ref 12.0–15.0)
Immature Granulocytes: 0 %
Lymphocytes Relative: 12 %
Lymphs Abs: 1 K/uL (ref 0.7–4.0)
MCH: 32.5 pg (ref 26.0–34.0)
MCHC: 34.5 g/dL (ref 30.0–36.0)
MCV: 94 fL (ref 80.0–100.0)
Monocytes Absolute: 0.6 K/uL (ref 0.1–1.0)
Monocytes Relative: 8 %
Neutro Abs: 6.2 K/uL (ref 1.7–7.7)
Neutrophils Relative %: 78 %
Platelets: 330 K/uL (ref 150–400)
RBC: 3.85 MIL/uL — ABNORMAL LOW (ref 3.87–5.11)
RDW: 12 % (ref 11.5–15.5)
WBC: 7.9 K/uL (ref 4.0–10.5)
nRBC: 0 % (ref 0.0–0.2)

## 2023-01-20 LAB — TSH: TSH: 0.576 u[IU]/mL (ref 0.350–4.500)

## 2023-01-20 LAB — COMPREHENSIVE METABOLIC PANEL WITH GFR
ALT: 25 U/L (ref 0–44)
AST: 24 U/L (ref 15–41)
Albumin: 4.6 g/dL (ref 3.5–5.0)
Alkaline Phosphatase: 49 U/L (ref 38–126)
Anion gap: 10 (ref 5–15)
BUN: 7 mg/dL — ABNORMAL LOW (ref 8–23)
CO2: 21 mmol/L — ABNORMAL LOW (ref 22–32)
Calcium: 9.2 mg/dL (ref 8.9–10.3)
Chloride: 99 mmol/L (ref 98–111)
Creatinine, Ser: 0.64 mg/dL (ref 0.44–1.00)
GFR, Estimated: >60 mL/min
Glucose, Bld: 124 mg/dL — ABNORMAL HIGH (ref 70–99)
Potassium: 3.6 mmol/L (ref 3.5–5.1)
Sodium: 130 mmol/L — ABNORMAL LOW (ref 135–145)
Total Bilirubin: 0.5 mg/dL (ref 0.3–1.2)
Total Protein: 7.4 g/dL (ref 6.5–8.1)

## 2023-01-20 LAB — PROTIME-INR
INR: 1.1 (ref 0.8–1.2)
Prothrombin Time: 13.9 seconds (ref 11.4–15.2)

## 2023-01-20 LAB — T4, FREE: Free T4: 1.61 ng/dL — ABNORMAL HIGH (ref 0.61–1.12)

## 2023-01-20 LAB — CBG MONITORING, ED: Glucose-Capillary: 134 mg/dL — ABNORMAL HIGH (ref 70–99)

## 2023-01-20 LAB — TROPONIN I (HIGH SENSITIVITY): Troponin I (High Sensitivity): 11 ng/L (ref <18)

## 2023-01-20 MED ORDER — CYCLOBENZAPRINE HCL 10 MG PO TABS: 10.0000 mg | ORAL_TABLET | Freq: Three times a day (TID) | ORAL | 0 refills | Status: AC | PRN

## 2023-01-20 MED ORDER — CYCLOBENZAPRINE HCL 10 MG PO TABS
10.0000 mg | ORAL_TABLET | Freq: Once | ORAL | Status: AC
  Administered 2023-01-20: 10 mg via ORAL
  Filled 2023-01-20: qty 1

## 2023-01-20 NOTE — ED Notes (Signed)
Patient transported to CT 

## 2023-01-20 NOTE — ED Provider Notes (Signed)
[facility] Provider Note   Event Date/Time   First MD Initiated Contact with Patient 01/20/23 1829     (approximate) History  Hypertension  HPI Amber Livingston is a 62 y.o. female with a past medical history of anxiety, chronic abdominal pain, chronic back pain, chronic pain syndrome, cannabis abuse, fibromyalgia, nicotine dependence, and emphysema who presents complaining of multiple symptoms including head and neck pain that began upon awakening.  Patient states that she feels a tightness in the right aspect of her neck radiating up into her head.  Patient describes this pain as 10/10 and intermittent. ROS: Patient currently denies any tinnitus, difficulty speaking, facial droop, sore throat, chest pain, shortness of breath, abdominal pain, nausea/vomiting/diarrhea, dysuria, or weakness/numbness/paresthesias in any extremity   Physical Exam  Triage Vital Signs: ED Triage Vitals  Enc Vitals Group     BP 01/20/23 1514 (!) 171/96     Pulse Rate 01/20/23 1514 (!) 118     Resp 01/20/23 1514 (!) 24     Temp 01/20/23 1514 98.4 F (36.9 C)     Temp Source 01/20/23 1514 Oral     SpO2 01/20/23 1514 99 %     Weight 01/20/23 1513 112 lb 7 oz (51 kg)     Height 01/20/23 1513 5\' 8"  (1.727 m)     Head Circumference --      Peak Flow --      Pain Score 01/20/23 1513 10     Pain Loc --      Pain Edu? --      Excl. in GC? --    Most recent vital signs: Vitals:   01/20/23 1823 01/20/23 1830  BP: (!) 146/89 136/79  Pulse: (!) 118 91  Resp: (!) 22 (!) 35  Temp: 98.3 F (36.8 C)   SpO2: 99% 100%   General: Awake, oriented x4. CV:  Good peripheral perfusion.  Resp:  Normal effort.  Abd:  No distention.  Other:  Middle-aged well-developed, well-nourished Caucasian female laying in bed in mild distress secondary to pain ED Results / Procedures / Treatments  Labs (all labs ordered are listed, but only abnormal results are displayed) Labs Reviewed  COMPREHENSIVE  METABOLIC PANEL - Abnormal; Notable for the following components:      Result Value   Sodium 130 (*)    CO2 21 (*)    Glucose, Bld 124 (*)    BUN 7 (*)    All other components within normal limits  CBC WITH DIFFERENTIAL/PLATELET - Abnormal; Notable for the following components:   RBC 3.85 (*)    All other components within normal limits  T4, FREE - Abnormal; Notable for the following components:   Free T4 1.61 (*)    All other components within normal limits  CBG MONITORING, ED - Abnormal; Notable for the following components:   Glucose-Capillary 134 (*)    All other components within normal limits  PROTIME-INR  TSH  URINALYSIS, ROUTINE W REFLEX MICROSCOPIC  URINE DRUG SCREEN, QUALITATIVE ([facility] ONLY)  T3, FREE  TROPONIN I (HIGH SENSITIVITY)   EKG ED ECG REPORT I, Merwyn Katos, the attending physician, personally viewed and interpreted this ECG. Date: 01/20/2023 EKG Time: 1517 Rate: 110 Rhythm: Tachycardic sinus rhythm QRS Axis: normal Intervals: normal ST/T Wave abnormalities: normal Narrative Interpretation: Tachycardic sinus rhythm.  No evidence of acute ischemia RADIOLOGY ED MD interpretation: CT of the head without contrast interpreted by me shows no evidence of acute abnormalities including no intracerebral  hemorrhage, obvious masses, or significant edema  2 view chest x-ray interpreted by me shows no evidence of acute abnormalities including no pneumonia, pneumothorax, or widened mediastinum.  Emphysema -Agree with radiology assessment Official radiology report(s): CT Head Wo Contrast  Result Date: 01/20/2023 CLINICAL DATA:  Mental status change and headache EXAM: CT HEAD WITHOUT CONTRAST TECHNIQUE: Contiguous axial images were obtained from the base of the skull through the vertex without intravenous contrast. RADIATION DOSE REDUCTION: This exam was performed according to the departmental dose-optimization program which includes automated exposure control, adjustment  of the mA and/or kV according to patient size and/or use of iterative reconstruction technique. COMPARISON:  01/05/2023 FINDINGS: Brain: No intracranial hemorrhage, mass effect, or evidence of acute infarct. No hydrocephalus. No extra-axial fluid collection. Vascular: No hyperdense vessel or unexpected calcification. Skull: No fracture or focal lesion. Sinuses/Orbits: No acute finding. Paranasal sinuses and mastoid air cells are well aerated. Other: None. IMPRESSION: No acute intracranial process. Electronically Signed   By: Minerva Fester M.D.   On: 01/20/2023 16:56   DG Chest 2 View  Result Date: 01/20/2023 CLINICAL DATA:  Weakness for 1 week EXAM: CHEST - 2 VIEW COMPARISON:  None available. FINDINGS: Cardiomediastinal silhouette and pulmonary vasculature are within normal limits. Lungs are hyperexpanded, but otherwise clear. Asymmetric widening of the right acromioclavicular joint. IMPRESSION: 1. No acute cardiopulmonary process. 2. Emphysema 3. Asymmetric Widening of the right acromioclavicular joint consistent with AC joint separation, of unknown chronicity. Electronically Signed   By: Acquanetta Belling M.D.   On: 01/20/2023 15:47   PROCEDURES: Critical Care performed: No Procedures MEDICATIONS ORDERED IN ED: Medications  cyclobenzaprine (FLEXERIL) tablet 10 mg (10 mg Oral Given 01/20/23 1910)   IMPRESSION / MDM / ASSESSMENT AND PLAN / ED COURSE  I reviewed the triage vital signs and the nursing notes.                             The patient is on the cardiac monitor to evaluate for evidence of arrhythmia and/or significant heart rate changes. Patient's presentation is most consistent with acute presentation with potential threat to life or bodily function. This patient presents with a headache most consistent with benign headache from either tension type headache vs migraine. No headache red flags. Neurologic exam without evidence of meningismus, AMS, focal neurologic findings so doubt meningitis,  encephalitis, stroke. Presentation not consistent with acute intracranial bleed to include SAH (lack of risk factors, headache history). No history of trauma so doubt ICH. Given history and physical temporal arteritis unlikely, as is acute angle closure glaucoma. Doubt carotid artery dissection given no focal neuro deficits, no neck trauma or recent neck strain. Patient with no signs of increased intracranial pressure or weight loss and history and physical suggest more benign headache so less likely mass effect in brain from tumor or abscess or idiopathic intracranial hypertension. Pain was controlled with headache cocktail and patient discharged home with PMD follow up.   FINAL CLINICAL IMPRESSION(S) / ED DIAGNOSES   Final diagnoses:  Cervicogenic headache   Rx / DC Orders   ED Discharge Orders          Ordered    cyclobenzaprine (FLEXERIL) 10 MG tablet  3 times daily PRN        01/20/23 1859           Note:  This document was prepared using Dragon voice recognition software and may include unintentional dictation errors.  Merwyn Katos, MD 01/20/23 [phone]

## 2023-01-20 NOTE — ED Triage Notes (Addendum)
Ptto triage via wheelchair.  Pt is crying and upset.  Pt has a headache and neck pain since 9am.  No known injury to neck.  Pt reports stenosis.   Pt started bp meds two weeks ago.  Pt reports confusion.  No slurred speech . Pt thrashing about in wheelchair, cursing.  Pt alert.  Fsbs 134 in triage.

## 2023-01-20 NOTE — ED Provider Triage Note (Signed)
Emergency Medicine Provider Triage Evaluation Note  Amber Livingston , a 62 y.o. female  was evaluated in triage.  Pt complains of 1 week, feeling confused, thinking that she has a stroke, feeling that she has a systemic fungal infection.  Patient states that symptoms have been ongoing for a year approximately 2 weeks or possibly today.  Unable to quantify.  Patient is complaining everything from headache to the back pain to, feeling weak, to feeling dizzy to feeling.  Patient is confused/delirious according to her and her significant other who is with her, however there is been no slurred speech, unilateral weakness..  Review of Systems  Positive: Headache, weakness, body aches, dizziness Negative: Chest pain, GI symptoms  Physical Exam  BP (!) 171/96 (BP Location: Left Arm)   Pulse (!) 118   Temp 98.4 F (36.9 C) (Oral)   Resp (!) 24   Ht 5\' 8"  (1.727 m)   Wt 51 kg   SpO2 99%   BMI 17.10 kg/m  Gen:   Awake, no distress   Resp:  Normal effort MSK:   Moves extremities without difficulty  Other:  Cranial nerves II through XII grossly intact at this time  Medical Decision Making  Medically screening exam initiated at 3:25 PM.  Appropriate orders placed.  Amber Livingston was informed that the remainder of the evaluation will be completed by another provider, this initial triage assessment does not replace that evaluation, and the importance of remaining in the ED until their evaluation is complete.  CT scan, x-ray, labs, EKG ordered at this time.   Racheal Patches, PA-C 01/20/23 1531

## 2023-01-20 NOTE — ED Notes (Signed)
AAOx3.  Skin warm and dry. Ambulating independently with steady gait throughout the waiting area.  Skin warm and dry. NAD

## 2023-01-21 ENCOUNTER — Encounter: Payer: Self-pay | Admitting: Physician Assistant

## 2023-01-21 ENCOUNTER — Ambulatory Visit: Payer: Medicaid Other | Attending: Physician Assistant | Admitting: Physician Assistant

## 2023-01-21 VITALS — BP 144/92 | HR 82 | Ht 68.0 in | Wt 114.8 lb

## 2023-01-21 DIAGNOSIS — R7989 Other specified abnormal findings of blood chemistry: Secondary | ICD-10-CM | POA: Diagnosis not present

## 2023-01-21 DIAGNOSIS — I700 Atherosclerosis of aorta: Secondary | ICD-10-CM | POA: Diagnosis not present

## 2023-01-21 DIAGNOSIS — E782 Mixed hyperlipidemia: Secondary | ICD-10-CM

## 2023-01-21 DIAGNOSIS — I471 Supraventricular tachycardia, unspecified: Secondary | ICD-10-CM

## 2023-01-21 LAB — CBC WITH DIFF/PLATELET
Basophils Absolute: 0.1 10*3/uL (ref 0.0–0.2)
Basos: 1 %
EOS (ABSOLUTE): 0.2 10*3/uL (ref 0.0–0.4)
Eos: 3 %
Hematocrit: 39.1 % (ref 34.0–46.6)
Hemoglobin: 13.1 g/dL (ref 11.1–15.9)
Immature Grans (Abs): 0 10*3/uL (ref 0.0–0.1)
Immature Granulocytes: 0 %
Lymphocytes Absolute: 1 10*3/uL (ref 0.7–3.1)
Lymphs: 18 %
MCH: 32 pg (ref 26.6–33.0)
MCHC: 33.5 g/dL (ref 31.5–35.7)
MCV: 95 fL (ref 79–97)
Monocytes Absolute: 0.5 10*3/uL (ref 0.1–0.9)
Monocytes: 8 %
Neutrophils Absolute: 3.8 10*3/uL (ref 1.4–7.0)
Neutrophils: 70 %
Platelets: 399 10*3/uL (ref 150–450)
RBC: 4.1 x10E6/uL (ref 3.77–5.28)
RDW: 11.8 % (ref 11.7–15.4)
WBC: 5.5 10*3/uL (ref 3.4–10.8)

## 2023-01-21 LAB — T3, FREE: T3, Free: 3.1 pg/mL (ref 2.0–4.4)

## 2023-01-21 MED ORDER — METOPROLOL TARTRATE 25 MG PO TABS: 12.5000 mg | ORAL_TABLET | Freq: Two times a day (BID) | ORAL | 3 refills | Status: DC

## 2023-01-21 NOTE — Patient Instructions (Signed)
Medication Instructions:  Your physician has recommended you make the following change in your medication:   ** Begin Metoprolol 25mg  - 1/2 tablet by mouth twice daily.  ** You may decrease your Hydralazine to 1/2 tablet if your systolic BP is below 110.  *If you need a refill on your cardiac medications before your next appointment, please call your pharmacy*   Lab Work: None ordered.  If you have labs (blood work) drawn today and your tests are completely normal, you will receive your results only by: MyChart Message (if you have MyChart) OR A paper copy in the mail If you have any lab test that is abnormal or we need to change your treatment, we will call you to review the results.   Testing/Procedures: You have been referred to Endocrinology for an abnormal thyroid blood test.    Follow-Up: At [facility], you and your health needs are our priority.  As part of our continuing mission to provide you with exceptional heart care, we have created designated Provider Care Teams.  These Care Teams include your primary Cardiologist (physician) and Advanced Practice Providers (APPs -  Physician Assistants and Nurse Practitioners) who all work together to provide you with the care you need, when you need it.  We recommend signing up for the patient portal called "MyChart".  Sign up information is provided on this After Visit Summary.  MyChart is used to connect with patients for Virtual Visits (Telemedicine).  Patients are able to view lab/test results, encounter notes, upcoming appointments, etc.  Non-urgent messages can be sent to your provider as well.   To learn more about what you can do with MyChart, go to [email or web address].    Your next appointment:   As scheduled with Dr Izora Ribas

## 2023-01-27 ENCOUNTER — Ambulatory Visit: Payer: Medicaid Other | Admitting: Pain Medicine

## 2023-01-28 ENCOUNTER — Emergency Department
Admission: EM | Admit: 2023-01-28 | Discharge: 2023-01-28 | Discharge status: Against Medical Advice | Payer: Medicaid Other

## 2023-01-28 ENCOUNTER — Encounter (HOSPITAL_BASED_OUTPATIENT_CLINIC_OR_DEPARTMENT_OTHER): Payer: Self-pay | Admitting: Pulmonary Disease

## 2023-01-28 ENCOUNTER — Ambulatory Visit (HOSPITAL_BASED_OUTPATIENT_CLINIC_OR_DEPARTMENT_OTHER): Payer: Medicaid Other | Admitting: Pulmonary Disease

## 2023-01-28 VITALS — BP 136/78 | HR 61 | Temp 98.1°F | Ht 68.0 in | Wt 117.4 lb

## 2023-01-28 DIAGNOSIS — J432 Centrilobular emphysema: Secondary | ICD-10-CM | POA: Diagnosis not present

## 2023-01-28 MED ORDER — PULMICORT FLEXHALER 180 MCG/ACT IN AEPB: 1.0000 | INHALATION_SPRAY | Freq: Two times a day (BID) | RESPIRATORY_TRACT | 5 refills | Status: DC

## 2023-01-28 MED ORDER — STIOLTO RESPIMAT 2.5-2.5 MCG/ACT IN AERS: 2.0000 | INHALATION_SPRAY | Freq: Every day | RESPIRATORY_TRACT | 5 refills | Status: DC

## 2023-01-28 MED ORDER — ALBUTEROL SULFATE HFA 108 (90 BASE) MCG/ACT IN AERS: 2.0000 | INHALATION_SPRAY | RESPIRATORY_TRACT | 3 refills | Status: DC | PRN

## 2023-01-28 NOTE — Patient Instructions (Signed)
  Emphysema -last exacerbation in October/Nov 2023. Improved symptoms with LABA/LAMA addition --CONTINUE Stiolto TWO puffs in the morning. REFILL --CONTINUE Pulmicort 180 mcg ONE puff in the morning and evening. Rinse mouth after use --CONTINUE Albuterol as needed for shortness of breath or wheezing --CONTINUE nebulizer with meds: Take nightly or as needed up to 4 times a day  Lung nodules, stable --Enrolled in lung screening. Due in September

## 2023-01-28 NOTE — Telephone Encounter (Signed)
Closing encounter. NFN 

## 2023-01-28 NOTE — Telephone Encounter (Signed)
Dr. Everardo All was patient cleared for surgery today?
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
## 2023-01-28 NOTE — Telephone Encounter (Signed)
Patient had EGD/colonoscopy on 01/08/23. No pre op evaluation needed.

## 2023-01-29 ENCOUNTER — Telehealth: Payer: Self-pay | Admitting: Nurse Practitioner

## 2023-01-29 NOTE — Telephone Encounter (Signed)
Copied from CRM [id number]. Topic: General - Other >> Jan 29, 2023  8:36 AM Amber Livingston wrote: Reason for CRM: The patient would like to speak with B. Russell when possible   Please contact further when available

## 2023-01-31 NOTE — Transitions of Care (Post Inpatient/ED Visit) (Signed)
01/31/2023  Name: Amber Livingston MRN: [id number] DOB: 03-12-1961  Today's TOC FU Call Status: Today's TOC FU Call Status:: Successful TOC FU Call Competed TOC FU Call Complete Date: 01/31/23  Transition Care Management Follow-up Telephone Call Date of Discharge: 01/28/23 Discharge Facility[facility]) Type of Discharge: Emergency Department How have you been since you were released from the hospital?: Better Any questions or concerns?: No  Items Reviewed: Did you receive and understand the discharge instructions provided?: No Medications obtained,verified, and reconciled?: Yes (Medications Reviewed) Any new allergies since your discharge?: No Dietary orders reviewed?: NA Do you have support at home?: No  Medications Reviewed Today: Medications Reviewed Today     Reviewed by Pablo Ledger, CMA (Certified Medical Assistant) on 01/31/23 at 1042  Med List Status: <None>   Medication Order Taking? Sig Documenting Provider Last Dose Status Informant  albuterol (PROVENTIL) (2.5 MG/3ML) 0.083% nebulizer solution [id number] Yes Take 3 mLs (2.5 mg total) by nebulization every 6 (six) hours as needed for wheezing or shortness of breath. Luciano Cutter, MD Taking Active   albuterol (VENTOLIN HFA) 108 [phone] Base) MCG/ACT inhaler [id number] Yes Inhale 2 puffs into the lungs every 4 (four) hours as needed for wheezing or shortness of breath. Luciano Cutter, MD Taking Active   BEE POLLEN PO [id number] Yes Take 1 capsule by mouth daily. [provider] Taking Active   budesonide (PULMICORT FLEXHALER) 180 MCG/ACT inhaler [id number] Yes Inhale 1 puff into the lungs in the morning and at bedtime. Luciano Cutter, MD Taking Active   diphenhydramine-acetaminophen (TYLENOL PM) 25-500 MG TABS tablet [id number] Yes Take 2 tablets by mouth at bedtime. [provider] Taking Active   DULoxetine (CYMBALTA) 20 MG capsule [id number] Yes TAKE 2 CAPSULES BY MOUTH  EVERY DAY Ocie Doyne, MD Taking Active   estradiol (ESTRACE) 0.5 MG tablet [id number] Yes TAKE 1 TABLET BY MOUTH ONCE DAILY Linzie Collin, MD Taking Active   Evolocumab [facility] SURECLICK) 140 MG/ML SOAJ [id number] Yes INJECT 1 PEN. INTO THE SKIN EVERY 14 (FOURTEEN) DAYS. Christell Constant, MD Taking Active   hydrALAZINE (APRESOLINE) 50 MG tablet [id number] Yes Take 1 tablet (50 mg total) by mouth 3 (three) times daily as needed (if systolic Bp greater than 140 mmHg). Gillis Santa, MD Taking Active   meloxicam [facility]) 15 MG tablet [id number] Yes Take 1 tablet (15 mg total) by mouth daily as needed for pain.  Patient taking differently: Take 15 mg by mouth daily as needed for pain (carpel tunnel pain (hand surgery on right wrist)).   Larae Grooms, NP Taking Active   metoprolol tartrate (LOPRESSOR) 25 MG tablet [id number] Yes Take 0.5 tablets (12.5 mg total) by mouth 2 (two) times daily. Dyann Kief, PA-C Taking Active   nicotine polacrilex (COMMIT) 4 MG lozenge [id number] Yes Take 4 mg by mouth as needed for smoking cessation. [provider] Taking Active Self           Med Note Henreitta Leber, JACQUELINE L   Tue Dec 18, 2021  8:25 AM)    pantoprazole (PROTONIX) 40 MG tablet [id number] Yes Take 1 tablet (40 mg total) by mouth daily as needed. Larae Grooms, NP Taking Active   QUEtiapine (SEROQUEL) 25 MG tablet [id number] Yes Take 1 tablet (25 mg total) by mouth at bedtime. Weber Cooks, NP Taking Active   Tiotropium Bromide-Olodaterol (STIOLTO RESPIMAT) 2.5-2.5 MCG/ACT AERS [id number] Yes Inhale 2 puffs into the lungs once daily at 2 PM. Everardo All,  Oliver Pila, MD Taking Active             Home Care and Equipment/Supplies: Were Home Health Services Ordered?: NA Any new equipment or medical supplies ordered?: NA  Functional Questionnaire: Do you need assistance with bathing/showering or dressing?: No Do you need assistance with meal preparation?: No Do you  need assistance with eating?: No Do you have difficulty maintaining continence: No Do you need assistance with getting out of bed/getting out of a chair/moving?: No Do you have difficulty managing or taking your medications?: No  Follow up appointments reviewed: PCP Follow-up appointment confirmed?: No MD Provider Line Number:[phone] Given: No Do you need transportation to your follow-up appointment?: No Do you understand care options if your condition(s) worsen?: Yes-patient verbalized understanding    SIGNATURE: Wilhemena Durie, CMA

## 2023-02-04 ENCOUNTER — Ambulatory Visit: Admit: 2023-02-04 | Payer: Medicaid Other | Admitting: Gastroenterology

## 2023-02-04 SURGERY — COLONOSCOPY WITH PROPOFOL
Anesthesia: General

## 2023-02-08 ENCOUNTER — Other Ambulatory Visit: Payer: Self-pay | Admitting: Family Medicine

## 2023-02-08 DIAGNOSIS — F419 Anxiety disorder, unspecified: Secondary | ICD-10-CM

## 2023-02-10 ENCOUNTER — Ambulatory Visit: Payer: Medicaid Other | Admitting: Nurse Practitioner

## 2023-02-10 NOTE — Telephone Encounter (Signed)
Requested medication (s) are due for refill today: yes  Requested medication (s) are on the active medication list: yes  Last refill:  01/17/23  Future visit scheduled: yes  Notes to clinic:  Unable to refill per protocol, cannot delegate.      Requested Prescriptions  Pending Prescriptions Disp Refills   QUEtiapine (SEROQUEL) 25 MG tablet [Pharmacy Med Name: QUETIAPINE FUMARATE 25 MG TAB] 90 tablet 1    Sig: TAKE 1 TABLET BY MOUTH EVERYDAY AT BEDTIME     Not Delegated - Psychiatry:  Antipsychotics - Second Generation (Atypical) - quetiapine Failed - 02/08/2023 10:32 AM      Failed - This refill cannot be delegated      Failed - Lipid Panel in normal range within the last 12 months    Cholesterol, Total  Date Value Ref Range Status  12/03/2022 183 100 - 199 mg/dL Final   LDL Chol Calc (NIH)  Date Value Ref Range Status  12/03/2022 83 0 - 99 mg/dL Final   HDL  Date Value Ref Range Status  12/03/2022 82 >39 mg/dL Final   Triglycerides  Date Value Ref Range Status  12/03/2022 100 0 - 149 mg/dL Final         Passed - TSH in normal range and within 360 days    TSH  Date Value Ref Range Status  01/20/2023 0.576 0.350 - 4.500 uIU/mL Final    Comment:    Performed by a 3rd Generation assay with a functional sensitivity of <=0.01 uIU/mL. Performed at [facility], [address] [place], [place] [address]   08/27/2022 1.230 0.450 - 4.500 uIU/mL Final         Passed - Completed PHQ-2 or PHQ-9 in the last 360 days      Passed - Last BP in normal range    BP Readings from Last 1 Encounters:  01/28/23 136/78         Passed - Last Heart Rate in normal range    Pulse Readings from Last 1 Encounters:  01/28/23 61         Passed - Valid encounter within last 6 months    Recent Outpatient Visits           3 weeks ago Hyponatremia   [place] [facility] [place], Sherran Needs, NP   1 month ago Oral candida   [place] [facility] Larae Grooms, NP   2 months ago Oral candida   [place] [facility] [place], Sherran Needs, NP   2 months ago Aortic atherosclerosis [facility])   [place] [facility] Larae Grooms, NP   2 months ago Gastroesophageal reflux disease, unspecified whether esophagitis present   [place] [facility] Larae Grooms, NP       Future Appointments             In 3 weeks Mecum, Oswaldo Conroy, PA-C [facility]   In 2 months Izora Ribas, Rondel Jumbo, MD [facility]   In 3 months Altamese Dothan, MD [facility]            Passed - CBC within normal limits and completed in the last 12 months    WBC  Date Value Ref Range Status  01/20/2023 7.9 4.0 - 10.5 K/uL Final   RBC  Date Value Ref Range Status  01/20/2023 3.85 (L) 3.87 - 5.11 MIL/uL Final   Hemoglobin  Date Value Ref Range Status  01/20/2023 12.5 12.0 - 15.0 g/dL Final  45/40/9811 91.4 11.1 - 15.9 g/dL Final   HCT  Date Value Ref Range Status  01/20/2023 36.2 36.0 - 46.0 % Final   Hematocrit  Date Value Ref Range Status  01/20/2023 39.1 34.0 - 46.6 % Final   MCHC  Date Value Ref Range Status  01/20/2023 34.5 30.0 - 36.0 g/dL Final   [facility]  Date Value Ref Range Status  01/20/2023 32.5 26.0 - 34.0 pg Final   MCV  Date Value Ref Range Status  01/20/2023 94.0 80.0 - 100.0 fL Final  01/20/2023 95 79 - 97 fL Final   No results found for: "PLTCOUNTKUC", "LABPLAT", "POCPLA" RDW  Date Value Ref Range Status  01/20/2023 12.0 11.5 - 15.5 % Final  01/20/2023 11.8 11.7 - 15.4 % Final         Passed - CMP within normal limits and completed in the last 12 months    Albumin  Date Value Ref Range Status  01/20/2023 4.6 3.5 - 5.0 g/dL Final  78/29/5621 5.2 (H) 3.9 - 4.9 g/dL Final   Alkaline Phosphatase  Date Value Ref Range Status  01/20/2023 49 38 - 126 U/L Final   ALT   Date Value Ref Range Status  01/20/2023 25 0 - 44 U/L Final   AST  Date Value Ref Range Status  01/20/2023 24 15 - 41 U/L Final   BUN  Date Value Ref Range Status  01/20/2023 7 (L) 8 - 23 mg/dL Final  30/86/5784 4 (L) 8 - 27 mg/dL Final   Calcium  Date Value Ref Range Status  01/20/2023 9.2 8.9 - 10.3 mg/dL Final   CO2  Date Value Ref Range Status  01/20/2023 21 (L) 22 - 32 mmol/L Final   Creatinine, Ser  Date Value Ref Range Status  01/20/2023 0.64 0.44 - 1.00 mg/dL Final   Glucose, Bld  Date Value Ref Range Status  01/20/2023 124 (H) 70 - 99 mg/dL Final    Comment:    Glucose reference range applies only to samples taken after fasting for at least 8 hours.   Glucose-Capillary  Date Value Ref Range Status  01/20/2023 134 (H) 70 - 99 mg/dL Final    Comment:    Glucose reference range applies only to samples taken after fasting for at least 8 hours.   Potassium  Date Value Ref Range Status  01/20/2023 3.6 3.5 - 5.1 mmol/L Final   Sodium  Date Value Ref Range Status  01/20/2023 130 (L) 135 - 145 mmol/L Final  01/17/2023 129 (L) 134 - 144 mmol/L Final   Total Bilirubin  Date Value Ref Range Status  01/20/2023 0.5 0.3 - 1.2 mg/dL Final   Bilirubin Total  Date Value Ref Range Status  01/17/2023 0.4 0.0 - 1.2 mg/dL Final   Bilirubin, Direct  Date Value Ref Range Status  11/05/2021 <0.1 0.0 - 0.2 mg/dL Final   Indirect Bilirubin  Date Value Ref Range Status  11/05/2021 NOT CALCULATED 0.3 - 0.9 mg/dL Final    Comment:    Performed at [facility], [address] [place], [place] [address]   Protein, ur  Date Value Ref Range Status  11/30/2022 NEGATIVE NEGATIVE mg/dL Final   Total Protein  Date Value Ref Range Status  01/20/2023 7.4 6.5 - 8.1 g/dL Final  52/84/1324 7.4 6.0 - 8.5 g/dL Final   GFR calc Af Amer  Date Value Ref Range Status  09/27/2020  101 >59 mL/min/1.73 Final    Comment:    **In accordance with recommendations from the  NKF-ASN Task force,**   [facility] is in the process of updating its eGFR calculation to the   2021 CKD-EPI creatinine equation that estimates kidney function   without a race variable.    eGFR  Date Value Ref Range Status  01/17/2023 99 >59 mL/min/1.73 Final   GFR, Estimated  Date Value Ref Range Status  01/20/2023 >60 >60 mL/min Final    Comment:    (NOTE) Calculated using the CKD-EPI Creatinine Equation (2021)

## 2023-02-17 ENCOUNTER — Ambulatory Visit: Payer: Medicaid Other | Attending: Orthopedic Surgery | Admitting: Physical Therapy

## 2023-02-17 DIAGNOSIS — M25511 Pain in right shoulder: Secondary | ICD-10-CM | POA: Diagnosis present

## 2023-02-17 DIAGNOSIS — M6281 Muscle weakness (generalized): Secondary | ICD-10-CM | POA: Diagnosis present

## 2023-02-17 DIAGNOSIS — M542 Cervicalgia: Secondary | ICD-10-CM | POA: Insufficient documentation

## 2023-02-17 DIAGNOSIS — G8929 Other chronic pain: Secondary | ICD-10-CM | POA: Diagnosis present

## 2023-02-17 DIAGNOSIS — M797 Fibromyalgia: Secondary | ICD-10-CM | POA: Insufficient documentation
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
## 2023-02-19 ENCOUNTER — Ambulatory Visit: Payer: Medicaid Other | Admitting: Physical Therapy

## 2023-02-19 ENCOUNTER — Encounter: Payer: Self-pay | Admitting: Physical Therapy

## 2023-02-19 DIAGNOSIS — M797 Fibromyalgia: Secondary | ICD-10-CM

## 2023-02-19 DIAGNOSIS — M6281 Muscle weakness (generalized): Secondary | ICD-10-CM

## 2023-02-19 DIAGNOSIS — G8929 Other chronic pain: Secondary | ICD-10-CM

## 2023-02-19 DIAGNOSIS — M542 Cervicalgia: Secondary | ICD-10-CM

## 2023-02-19 MED ORDER — HYDRALAZINE HCL 50 MG PO TABS: 50.0000 mg | ORAL_TABLET | Freq: Three times a day (TID) | ORAL | 1 refills | Status: DC | PRN

## 2023-02-19 NOTE — Telephone Encounter (Signed)
Called spoke with pt in regards to medication refills.  Pt reports has metoprolol but has ran out of hydralazine. Advised pt hydralazine 50 mg PO TID is only to be taken if SBP is > 140.  Pt reports checks BP several times a day and keeps a log. Pt expresses understanding. Advised I will send in enough hydralazine to last until next OV with Dr. Izora Ribas on 8/30.  She thanked me for helping her.  No further concerns at this time.
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
## 2023-02-24 ENCOUNTER — Encounter: Payer: Self-pay | Admitting: Physical Therapy

## 2023-02-24 ENCOUNTER — Ambulatory Visit: Payer: Medicaid Other | Admitting: Physical Therapy

## 2023-02-24 DIAGNOSIS — M797 Fibromyalgia: Secondary | ICD-10-CM

## 2023-02-24 DIAGNOSIS — G8929 Other chronic pain: Secondary | ICD-10-CM

## 2023-02-24 DIAGNOSIS — M6281 Muscle weakness (generalized): Secondary | ICD-10-CM

## 2023-02-24 DIAGNOSIS — M542 Cervicalgia: Secondary | ICD-10-CM
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
## 2023-03-03 ENCOUNTER — Encounter: Payer: Self-pay | Admitting: Physical Therapy

## 2023-03-03 ENCOUNTER — Telehealth: Payer: Self-pay | Admitting: Pharmacy Technician

## 2023-03-03 ENCOUNTER — Other Ambulatory Visit (HOSPITAL_COMMUNITY): Payer: Self-pay

## 2023-03-03 ENCOUNTER — Ambulatory Visit: Payer: Medicaid Other | Admitting: Physical Therapy

## 2023-03-03 DIAGNOSIS — M797 Fibromyalgia: Secondary | ICD-10-CM

## 2023-03-03 DIAGNOSIS — G8929 Other chronic pain: Secondary | ICD-10-CM

## 2023-03-03 DIAGNOSIS — M6281 Muscle weakness (generalized): Secondary | ICD-10-CM

## 2023-03-03 DIAGNOSIS — M542 Cervicalgia: Secondary | ICD-10-CM
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
## 2023-03-03 NOTE — Telephone Encounter (Signed)
Pharmacy Patient Advocate Encounter  Received notification from [facility] that Prior Authorization for REPATHAhas been APPROVED from 7.15.24 to 7.15.25.Marland Kitchen

## 2023-03-03 NOTE — Telephone Encounter (Signed)
Pharmacy Patient Advocate Encounter   Received notification from CoverMyMeds that prior authorization for Repatha SureClick 140MG /ML auto-injectors is required/requested.   Insurance verification completed.   The patient is insured through [facility] .   Per test claim:   PA submitted to [facility] via CoverMyMeds Key/confirmation #/EOC [id number] Status is pending

## 2023-03-04 NOTE — Telephone Encounter (Signed)
Patient returning call. Transferred to [place], Charity fundraiser.

## 2023-03-04 NOTE — Telephone Encounter (Signed)
Returned Pts call. Left message to call back.

## 2023-03-04 NOTE — Telephone Encounter (Signed)
Patient called to follow-up on getting  her Repatha medication.  Patient stated she is running out of this medication.

## 2023-03-04 NOTE — Telephone Encounter (Signed)
Spoke with Pt. Told pt that her Repatha Pre Amber Livingston is approved.

## 2023-03-05 ENCOUNTER — Other Ambulatory Visit: Payer: Self-pay | Admitting: Nurse Practitioner

## 2023-03-05 ENCOUNTER — Ambulatory Visit: Payer: Medicaid Other | Admitting: Physical Therapy

## 2023-03-05 DIAGNOSIS — F419 Anxiety disorder, unspecified: Secondary | ICD-10-CM

## 2023-03-05 NOTE — Telephone Encounter (Signed)
Requested medication (s) are due for refill today - yes  Requested medication (s) are on the active medication list -yes  Future visit scheduled -yes  Last refill: 02/10/23 #30  Notes to clinic: non delegated Rx  Requested Prescriptions  Pending Prescriptions Disp Refills   QUEtiapine (SEROQUEL) 25 MG tablet [Pharmacy Med Name: QUETIAPINE FUMARATE 25 MG TAB] 90 tablet 1    Sig: TAKE 1 TABLET BY MOUTH EVERYDAY AT BEDTIME     Not Delegated - Psychiatry:  Antipsychotics - Second Generation (Atypical) - quetiapine Failed - 03/05/2023  8:30 AM      Failed - This refill cannot be delegated      Failed - Lipid Panel in normal range within the last 12 months    Cholesterol, Total  Date Value Ref Range Status  12/03/2022 183 100 - 199 mg/dL Final   LDL Chol Calc (NIH)  Date Value Ref Range Status  12/03/2022 83 0 - 99 mg/dL Final   HDL  Date Value Ref Range Status  12/03/2022 82 >39 mg/dL Final   Triglycerides  Date Value Ref Range Status  12/03/2022 100 0 - 149 mg/dL Final         Passed - TSH in normal range and within 360 days    TSH  Date Value Ref Range Status  01/20/2023 0.576 0.350 - 4.500 uIU/mL Final    Comment:    Performed by a 3rd Generation assay with a functional sensitivity of <=0.01 uIU/mL. Performed at [facility], [address] [place], [place] [address]   08/27/2022 1.230 0.450 - 4.500 uIU/mL Final         Passed - Completed PHQ-2 or PHQ-9 in the last 360 days      Passed - Last BP in normal range    BP Readings from Last 1 Encounters:  01/28/23 136/78         Passed - Last Heart Rate in normal range    Pulse Readings from Last 1 Encounters:  01/28/23 61         Passed - Valid encounter within last 6 months    Recent Outpatient Visits           1 month ago Hyponatremia   [place] [facility] [place], Sherran Needs, NP   2 months ago Oral candida   [place] [facility] Larae Grooms, NP   2  months ago Oral candida   [place] [facility] [place], Sherran Needs, NP   3 months ago Aortic atherosclerosis [facility])   [place] [facility] Larae Grooms, NP   3 months ago Gastroesophageal reflux disease, unspecified whether esophagitis present   [place] [facility] Larae Grooms, NP       Future Appointments             In 2 days Mecum, Oswaldo Conroy, PA-C [facility]   In 1 month [place], Rondel Jumbo, MD [facility]   In 2 months [place], MD [facility]            Passed - CBC within normal limits and completed in the last 12 months    WBC  Date Value Ref Range Status  01/20/2023 7.9 4.0 - 10.5 K/uL Final   RBC  Date Value Ref Range Status  01/20/2023 3.85 (L) 3.87 - 5.11 MIL/uL Final   Hemoglobin  Date Value Ref Range Status  01/20/2023 12.5 12.0 - 15.0 g/dL Final  30/16/0109 32.3 11.1 - 15.9 g/dL Final   HCT  Date Value Ref Range Status  01/20/2023 36.2 36.0 - 46.0 % Final   Hematocrit  Date Value Ref Range Status  01/20/2023 39.1 34.0 - 46.6 % Final   MCHC  Date Value Ref Range Status  01/20/2023 34.5 30.0 - 36.0 g/dL Final   [facility]  Date Value Ref Range Status  01/20/2023 32.5 26.0 - 34.0 pg Final   MCV  Date Value Ref Range Status  01/20/2023 94.0 80.0 - 100.0 fL Final  01/20/2023 95 79 - 97 fL Final   No results found for: "PLTCOUNTKUC", "LABPLAT", "POCPLA" RDW  Date Value Ref Range Status  01/20/2023 12.0 11.5 - 15.5 % Final  01/20/2023 11.8 11.7 - 15.4 % Final         Passed - CMP within normal limits and completed in the last 12 months    Albumin  Date Value Ref Range Status  01/20/2023 4.6 3.5 - 5.0 g/dL Final  55/73/2202 5.2 (H) 3.9 - 4.9 g/dL Final   Alkaline Phosphatase  Date Value Ref Range Status  01/20/2023 49 38 - 126 U/L Final   ALT  Date Value Ref Range Status   01/20/2023 25 0 - 44 U/L Final   AST  Date Value Ref Range Status  01/20/2023 24 15 - 41 U/L Final   BUN  Date Value Ref Range Status  01/20/2023 7 (L) 8 - 23 mg/dL Final  54/27/0623 4 (L) 8 - 27 mg/dL Final   Calcium  Date Value Ref Range Status  01/20/2023 9.2 8.9 - 10.3 mg/dL Final   CO2  Date Value Ref Range Status  01/20/2023 21 (L) 22 - 32 mmol/L Final   Creatinine, Ser  Date Value Ref Range Status  01/20/2023 0.64 0.44 - 1.00 mg/dL Final   Glucose, Bld  Date Value Ref Range Status  01/20/2023 124 (H) 70 - 99 mg/dL Final    Comment:    Glucose reference range applies only to samples taken after fasting for at least 8 hours.   Glucose-Capillary  Date Value Ref Range Status  01/20/2023 134 (H) 70 - 99 mg/dL Final    Comment:    Glucose reference range applies only to samples taken after fasting for at least 8 hours.   Potassium  Date Value Ref Range Status  01/20/2023 3.6 3.5 - 5.1 mmol/L Final   Sodium  Date Value Ref Range Status  01/20/2023 130 (L) 135 - 145 mmol/L Final  01/17/2023 129 (L) 134 - 144 mmol/L Final   Total Bilirubin  Date Value Ref Range Status  01/20/2023 0.5 0.3 - 1.2 mg/dL Final   Bilirubin Total  Date Value Ref Range Status  01/17/2023 0.4 0.0 - 1.2 mg/dL Final   Bilirubin, Direct  Date Value Ref Range Status  11/05/2021 <0.1 0.0 - 0.2 mg/dL Final   Indirect Bilirubin  Date Value Ref Range Status  11/05/2021 NOT CALCULATED 0.3 - 0.9 mg/dL Final    Comment:    Performed at [facility], [address] [place], [place] [address]   Protein, ur  Date Value Ref Range Status  11/30/2022 NEGATIVE NEGATIVE mg/dL Final   Total Protein  Date Value Ref Range Status  01/20/2023 7.4 6.5 - 8.1 g/dL Final  15/17/6160 7.4 6.0 - 8.5 g/dL Final   GFR calc Af Amer  Date Value Ref Range Status  09/27/2020 101 >59 mL/min/1.73 Final  Comment:    **In accordance with recommendations from the NKF-ASN Task force,**   [facility]  is in the process of updating its eGFR calculation to the   2021 CKD-EPI creatinine equation that estimates kidney function   without a race variable.    eGFR  Date Value Ref Range Status  01/17/2023 99 >59 mL/min/1.73 Final   GFR, Estimated  Date Value Ref Range Status  01/20/2023 >60 >60 mL/min Final    Comment:    (NOTE) Calculated using the CKD-EPI Creatinine Equation (2021)             Requested Prescriptions  Pending Prescriptions Disp Refills   QUEtiapine (SEROQUEL) 25 MG tablet [Pharmacy Med Name: QUETIAPINE FUMARATE 25 MG TAB] 90 tablet 1    Sig: TAKE 1 TABLET BY MOUTH EVERYDAY AT BEDTIME     Not Delegated - Psychiatry:  Antipsychotics - Second Generation (Atypical) - quetiapine Failed - 03/05/2023  8:30 AM      Failed - This refill cannot be delegated      Failed - Lipid Panel in normal range within the last 12 months    Cholesterol, Total  Date Value Ref Range Status  12/03/2022 183 100 - 199 mg/dL Final   LDL Chol Calc (NIH)  Date Value Ref Range Status  12/03/2022 83 0 - 99 mg/dL Final   HDL  Date Value Ref Range Status  12/03/2022 82 >39 mg/dL Final   Triglycerides  Date Value Ref Range Status  12/03/2022 100 0 - 149 mg/dL Final         Passed - TSH in normal range and within 360 days    TSH  Date Value Ref Range Status  01/20/2023 0.576 0.350 - 4.500 uIU/mL Final    Comment:    Performed by a 3rd Generation assay with a functional sensitivity of <=0.01 uIU/mL. Performed at [facility], [address] [place], [place] [address]   08/27/2022 1.230 0.450 - 4.500 uIU/mL Final         Passed - Completed PHQ-2 or PHQ-9 in the last 360 days      Passed - Last BP in normal range    BP Readings from Last 1 Encounters:  01/28/23 136/78         Passed - Last Heart Rate in normal range    Pulse Readings from Last 1 Encounters:  01/28/23 61         Passed - Valid encounter within last 6 months    Recent Outpatient Visits            1 month ago Hyponatremia   [place] [facility], Sherran Needs, NP   2 months ago Oral candida   [place] [facility] Larae Grooms, NP   2 months ago Oral candida   [place] [facility] [place], Sherran Needs, NP   3 months ago Aortic atherosclerosis [facility])   [place] [facility] Larae Grooms, NP   3 months ago Gastroesophageal reflux disease, unspecified whether esophagitis present   [place] [facility] Larae Grooms, NP       Future Appointments             In 2 days Mecum, Oswaldo Conroy, PA-C [place] [facility], [facility]   In 1 month [place], Rondel Jumbo, MD [facility]   In 2 months [place], MD [facility]  Passed - CBC within normal limits and completed in the last 12 months    WBC  Date Value Ref Range Status  01/20/2023 7.9 4.0 - 10.5 K/uL Final   RBC  Date Value Ref Range Status  01/20/2023 3.85 (L) 3.87 - 5.11 MIL/uL Final   Hemoglobin  Date Value Ref Range Status  01/20/2023 12.5 12.0 - 15.0 g/dL Final  40/98/1191 47.8 11.1 - 15.9 g/dL Final   HCT  Date Value Ref Range Status  01/20/2023 36.2 36.0 - 46.0 % Final   Hematocrit  Date Value Ref Range Status  01/20/2023 39.1 34.0 - 46.6 % Final   MCHC  Date Value Ref Range Status  01/20/2023 34.5 30.0 - 36.0 g/dL Final   [facility]  Date Value Ref Range Status  01/20/2023 32.5 26.0 - 34.0 pg Final   MCV  Date Value Ref Range Status  01/20/2023 94.0 80.0 - 100.0 fL Final  01/20/2023 95 79 - 97 fL Final   No results found for: "PLTCOUNTKUC", "LABPLAT", "POCPLA" RDW  Date Value Ref Range Status  01/20/2023 12.0 11.5 - 15.5 % Final  01/20/2023 11.8 11.7 - 15.4 % Final         Passed - CMP within normal limits and completed in the last 12 months    Albumin  Date Value Ref Range Status  01/20/2023 4.6 3.5 -  5.0 g/dL Final  29/56/2130 5.2 (H) 3.9 - 4.9 g/dL Final   Alkaline Phosphatase  Date Value Ref Range Status  01/20/2023 49 38 - 126 U/L Final   ALT  Date Value Ref Range Status  01/20/2023 25 0 - 44 U/L Final   AST  Date Value Ref Range Status  01/20/2023 24 15 - 41 U/L Final   BUN  Date Value Ref Range Status  01/20/2023 7 (L) 8 - 23 mg/dL Final  86/57/8469 4 (L) 8 - 27 mg/dL Final   Calcium  Date Value Ref Range Status  01/20/2023 9.2 8.9 - 10.3 mg/dL Final   CO2  Date Value Ref Range Status  01/20/2023 21 (L) 22 - 32 mmol/L Final   Creatinine, Ser  Date Value Ref Range Status  01/20/2023 0.64 0.44 - 1.00 mg/dL Final   Glucose, Bld  Date Value Ref Range Status  01/20/2023 124 (H) 70 - 99 mg/dL Final    Comment:    Glucose reference range applies only to samples taken after fasting for at least 8 hours.   Glucose-Capillary  Date Value Ref Range Status  01/20/2023 134 (H) 70 - 99 mg/dL Final    Comment:    Glucose reference range applies only to samples taken after fasting for at least 8 hours.   Potassium  Date Value Ref Range Status  01/20/2023 3.6 3.5 - 5.1 mmol/L Final   Sodium  Date Value Ref Range Status  01/20/2023 130 (L) 135 - 145 mmol/L Final  01/17/2023 129 (L) 134 - 144 mmol/L Final   Total Bilirubin  Date Value Ref Range Status  01/20/2023 0.5 0.3 - 1.2 mg/dL Final   Bilirubin Total  Date Value Ref Range Status  01/17/2023 0.4 0.0 - 1.2 mg/dL Final   Bilirubin, Direct  Date Value Ref Range Status  11/05/2021 <0.1 0.0 - 0.2 mg/dL Final   Indirect Bilirubin  Date Value Ref Range Status  11/05/2021 NOT CALCULATED 0.3 - 0.9 mg/dL Final    Comment:    Performed at [facility], [address], [place], [place] [address]   Protein,  ur  Date Value Ref Range Status  11/30/2022 NEGATIVE NEGATIVE mg/dL Final   Total Protein  Date Value Ref Range Status  01/20/2023 7.4 6.5 - 8.1 g/dL Final  08/27/3233 7.4 6.0 - 8.5 g/dL Final    GFR calc Af Amer  Date Value Ref Range Status  09/27/2020 101 >59 mL/min/1.73 Final    Comment:    **In accordance with recommendations from the NKF-ASN Task force,**   [facility] is in the process of updating its eGFR calculation to the   2021 CKD-EPI creatinine equation that estimates kidney function   without a race variable.    eGFR  Date Value Ref Range Status  01/17/2023 99 >59 mL/min/1.73 Final   GFR, Estimated  Date Value Ref Range Status  01/20/2023 >60 >60 mL/min Final    Comment:    (NOTE) Calculated using the CKD-EPI Creatinine Equation (2021)

## 2023-03-06 ENCOUNTER — Ambulatory Visit: Payer: Medicaid Other | Admitting: Physician Assistant

## 2023-03-07 ENCOUNTER — Encounter: Payer: Self-pay | Admitting: Physician Assistant

## 2023-03-07 ENCOUNTER — Telehealth: Payer: Self-pay | Admitting: Pulmonary Disease

## 2023-03-07 ENCOUNTER — Ambulatory Visit: Payer: Medicaid Other | Admitting: Physician Assistant

## 2023-03-07 VITALS — BP 136/81 | HR 62 | Ht 68.0 in | Wt 118.0 lb

## 2023-03-07 DIAGNOSIS — R1319 Other dysphagia: Secondary | ICD-10-CM

## 2023-03-07 DIAGNOSIS — E782 Mixed hyperlipidemia: Secondary | ICD-10-CM | POA: Diagnosis not present

## 2023-03-07 DIAGNOSIS — R7989 Other specified abnormal findings of blood chemistry: Secondary | ICD-10-CM | POA: Diagnosis not present

## 2023-03-07 DIAGNOSIS — J432 Centrilobular emphysema: Secondary | ICD-10-CM

## 2023-03-07 MED ORDER — FLUCONAZOLE 150 MG PO TABS
150.0000 mg | ORAL_TABLET | Freq: Every day | ORAL | 0 refills | Status: DC
Start: 2023-03-07 — End: 2023-04-09

## 2023-03-07 MED ORDER — NYSTATIN 100000 UNIT/ML MT SUSP: 5.0000 mL | Freq: Four times a day (QID) | OROMUCOSAL | 0 refills | Status: AC

## 2023-03-07 NOTE — Telephone Encounter (Signed)
PT calling stating she has issues w/thrush and she needs to get off any inhalers w/steroids, per her other Dr.  Right now she is just using Albuterol and Stiletto. She would like a nurse to call. Even if I am able to make appt she needs a call back please because it will likely be weeks away.   Her # is [phone]

## 2023-03-07 NOTE — Progress Notes (Signed)
Acute Office Visit   Patient: Amber Livingston   DOB: February 16, 1961   62 y.o. Female  MRN: [id number] Visit Date: 03/07/2023  Today's healthcare provider: Oswaldo Conroy Teshaun Olarte, PA-C  Introduced myself to the patient as a Secondary school teacher and provided education on APPs in clinical practice.    Chief Complaint  Patient presents with   [name]    Patient says she is having issues with Thrush again. Patient says she was having vomiting on Monday. Patient says it is not in her throat it is in her esophagus and says the steroids in the inhalers trigger the Thrush flora. Patient is requesting another round of antibiotics to help. Patient says she is not scheduled pulmonologist until mid August.    Subjective    HPI HPI     Thrush    Additional comments: Patient says she is having issues with Thrush again. Patient says she was having vomiting on Monday. Patient says it is not in her throat it is in her esophagus and says the steroids in the inhalers trigger the Thrush flora. Patient is requesting another round of antibiotics to help. Patient says she is not scheduled pulmonologist until mid August.       Last edited by Malen Gauze, CMA on 03/07/2023  1:12 PM.       She reports her voice became hoarse about a week ago She reports vomiting Monday night and has stopped her inhalers  She has called Pulmonology to get in for an apt with them to discuss her inhalers  She states she feels like her breathing is restricted right now  She reports some trouble swallowing She reports changes to her bowel habits- states she is having some constipation for the past 3 days but finally had a bowel movement today     She states she has been very emotional and upset due to how she has been feeling with the thrush   She states she has stopped taking her Seroquel- states she felt very "drugged out" while taking (took for 2 days) then stopped       Medications: Outpatient Medications Prior to Visit  Medication  Sig   albuterol (PROVENTIL) (2.5 MG/3ML) 0.083% nebulizer solution Take 3 mLs (2.5 mg total) by nebulization every 6 (six) hours as needed for wheezing or shortness of breath.   BEE POLLEN PO Take 1 capsule by mouth daily.   diphenhydramine-acetaminophen (TYLENOL PM) 25-500 MG TABS tablet Take 2 tablets by mouth at bedtime.   DULoxetine (CYMBALTA) 20 MG capsule TAKE 2 CAPSULES BY MOUTH EVERY DAY   estradiol (ESTRACE) 0.5 MG tablet TAKE 1 TABLET BY MOUTH ONCE DAILY   Evolocumab (REPATHA SURECLICK) 140 MG/ML SOAJ INJECT 1 PEN. INTO THE SKIN EVERY 14 (FOURTEEN) DAYS.   hydrALAZINE (APRESOLINE) 50 MG tablet Take 1 tablet (50 mg total) by mouth 3 (three) times daily as needed (if systolic Bp greater than 140 mmHg).   meloxicam (MOBIC) 15 MG tablet Take 1 tablet (15 mg total) by mouth daily as needed for pain. (Patient taking differently: Take 15 mg by mouth daily as needed for pain (carpel tunnel pain (hand surgery on right wrist)).)   metoprolol tartrate (LOPRESSOR) 25 MG tablet Take 0.5 tablets (12.5 mg total) by mouth 2 (two) times daily.   nicotine polacrilex (COMMIT) 4 MG lozenge Take 4 mg by mouth as needed for smoking cessation.   pantoprazole (PROTONIX) 40 MG tablet Take 1 tablet (40 mg total) by mouth  daily as needed.   albuterol (VENTOLIN HFA) 108 (90 Base) MCG/ACT inhaler Inhale 2 puffs into the lungs every 4 (four) hours as needed for wheezing or shortness of breath. (Patient not taking: Reported on 03/07/2023)   budesonide (PULMICORT FLEXHALER) 180 MCG/ACT inhaler Inhale 1 puff into the lungs in the morning and at bedtime. (Patient not taking: Reported on 03/07/2023)   QUEtiapine (SEROQUEL) 25 MG tablet TAKE 1 TABLET BY MOUTH EVERYDAY AT BEDTIME (Patient not taking: Reported on 03/07/2023)   Tiotropium Bromide-Olodaterol (STIOLTO RESPIMAT) 2.5-2.5 MCG/ACT AERS Inhale 2 puffs into the lungs once daily at 2 PM. (Patient not taking: Reported on 03/07/2023)   No facility-administered medications  prior to visit.    Review of Systems  Constitutional:  Positive for chills and fever.  Gastrointestinal:  Positive for abdominal pain, nausea and vomiting.         Objective    BP 136/81   Pulse 62   Ht 5\' 8"  (1.727 m)   Wt 118 lb (53.5 kg)   SpO2 100%   BMI 17.94 kg/m      Physical Exam Vitals reviewed.  Constitutional:      General: She is awake. She is not in acute distress.    Appearance: Normal appearance. She is well-developed and well-groomed. She is not ill-appearing or toxic-appearing.  HENT:     Head: Normocephalic and atraumatic.     Right Ear: Hearing normal.     Left Ear: Hearing normal.     Mouth/Throat:     Lips: Pink.     Mouth: Mucous membranes are moist. No lacerations or oral lesions.     Dentition: Has dentures.     Tongue: No lesions. Tongue does not deviate from midline.     Pharynx: Oropharynx is clear. Uvula midline. No pharyngeal swelling, oropharyngeal exudate or posterior oropharyngeal erythema.     Tonsils: No tonsillar exudate or tonsillar abscesses.  Eyes:     General: Lids are normal. Gaze aligned appropriately.     Extraocular Movements: Extraocular movements intact.     Conjunctiva/sclera: Conjunctivae normal.  Cardiovascular:     Rate and Rhythm: Normal rate and regular rhythm.     Pulses: Normal pulses.     Heart sounds: Normal heart sounds.  Pulmonary:     Effort: Pulmonary effort is normal.     Breath sounds: Normal breath sounds. No decreased air movement. No decreased breath sounds, wheezing, rhonchi or rales.  Musculoskeletal:     Cervical back: Normal range of motion.  Neurological:     Mental Status: She is alert.  Psychiatric:        Attention and Perception: Attention normal.        Mood and Affect: Mood is anxious. Affect is labile.        Speech: Speech is rapid and pressured and tangential.        Behavior: Behavior is cooperative.       No results found for any visits on 03/07/23.  Assessment & Plan       No follow-ups on file.     Problem List Items Addressed This Visit       Respiratory   Centrilobular emphysema (HCC)    Chronic, unsure of control at this time given her concerns today Reviewed that she should continue to use Stiolto inhaler as this does not have steroids so it should not be aggravating her dysphagia Will defer to Pulmonology as Pt has reached out to them for concerns as well  Follow up as needed for persistent or progressing symptoms          Digestive   Other dysphagia    Unsure of chronicity Patient reports hoarse voice, issues with swallowing, nausea and vomiting for the past week - she states this is a recurrent issue and is convinced she has esophageal candidiasis  Reviewed her most recent endoscopy results which did not demonstrate candida infection even in pathology results Patient's clinical complaints may be consistent with perceived condition and she is adamant that she needs Diflucan 7 day course for management today Will check CBC, CMP today and will provide Diflucan per request Suspect further review of her previous results will need to be attempted for patient understanding of her condition. Agree with previous plans to have her see speech therapy for further evaluation.  Will defer to their recommendations and GI Follow up as needed for persistent or progressing symptoms        Relevant Medications   fluconazole (DIFLUCAN) 150 MG tablet   Other Relevant Orders   CBC w/Diff   Comp Met (CMET)     Other   Mixed hyperlipidemia - Primary    Chronic, historic condition Appears controlled with current regimen comprised of Repatha- she is requesting recheck of lipid panel today Labs ordered- results to guide further management Follow up in 3 months or sooner if concerns arise       Relevant Orders   Lipid Profile   Other Visit Diagnoses     Elevated serum free T4 level       Relevant Orders   Thyroid Panel With TSH        No  follow-ups on file.   I, Elvyn Krohn E Keaisha Sublette, PA-C, have reviewed all documentation for this visit. The documentation on 03/07/23 for the exam, diagnosis, procedures, and orders are all accurate and complete.   Jacquelin Hawking, MHS, PA-C [facility]

## 2023-03-07 NOTE — Assessment & Plan Note (Signed)
Unsure of chronicity Patient reports hoarse voice, issues with swallowing, nausea and vomiting for the past week - she states this is a recurrent issue and is convinced she has esophageal candidiasis  Reviewed her most recent endoscopy results which did not demonstrate candida infection even in pathology results Patient's clinical complaints may be consistent with perceived condition and she is adamant that she needs Diflucan 7 day course for management today Will check CBC, CMP today and will provide Diflucan per request Suspect further review of her previous results will need to be attempted for patient understanding of her condition. Agree with previous plans to have her see speech therapy for further evaluation.  Will defer to their recommendations and GI Follow up as needed for persistent or progressing symptoms

## 2023-03-07 NOTE — Telephone Encounter (Signed)
OK to stop Pulmicort. Patient advised to stay Stiolto and Albuterol  Rx nystatin suspension for thrush

## 2023-03-07 NOTE — Assessment & Plan Note (Signed)
Chronic, unsure of control at this time given her concerns today Reviewed that she should continue to use Stiolto inhaler as this does not have steroids so it should not be aggravating her dysphagia Will defer to Pulmonology as Pt has reached out to them for concerns as well Follow up as needed for persistent or progressing symptoms

## 2023-03-07 NOTE — Assessment & Plan Note (Signed)
Chronic, historic condition Appears controlled with current regimen comprised of Repatha- she is requesting recheck of lipid panel today Labs ordered- results to guide further management Follow up in 3 months or sooner if concerns arise

## 2023-03-08 LAB — LIPID PANEL
Chol/HDL Ratio: 1.8 ratio (ref 0.0–4.4)
Cholesterol, Total: 169 mg/dL (ref 100–199)
HDL: 96 mg/dL (ref >39)
LDL Chol Calc (NIH): 59 mg/dL (ref 0–99)
Triglycerides: 78 mg/dL (ref 0–149)
VLDL Cholesterol Cal: 14 mg/dL (ref 5–40)

## 2023-03-08 LAB — CBC WITH DIFFERENTIAL/PLATELET
Basophils Absolute: 0.1 10*3/uL (ref 0.0–0.2)
Basos: 1 %
EOS (ABSOLUTE): 0.1 10*3/uL (ref 0.0–0.4)
Eos: 2 %
Hematocrit: 36.3 % (ref 34.0–46.6)
Hemoglobin: 12.6 g/dL (ref 11.1–15.9)
Immature Grans (Abs): 0 10*3/uL (ref 0.0–0.1)
Immature Granulocytes: 0 %
Lymphocytes Absolute: 1.1 10*3/uL (ref 0.7–3.1)
Lymphs: 24 %
MCH: 32.9 pg (ref 26.6–33.0)
MCHC: 34.7 g/dL (ref 31.5–35.7)
MCV: 95 fL (ref 79–97)
Monocytes Absolute: 0.5 10*3/uL (ref 0.1–0.9)
Monocytes: 11 %
Neutrophils Absolute: 2.8 10*3/uL (ref 1.4–7.0)
Neutrophils: 62 %
Platelets: 288 10*3/uL (ref 150–450)
RBC: 3.83 x10E6/uL (ref 3.77–5.28)
RDW: 12.2 % (ref 11.7–15.4)
WBC: 4.5 10*3/uL (ref 3.4–10.8)

## 2023-03-08 LAB — COMPREHENSIVE METABOLIC PANEL
ALT: 20 IU/L (ref 0–32)
AST: 19 IU/L (ref 0–40)
Albumin: 4.5 g/dL (ref 3.9–4.9)
Alkaline Phosphatase: 55 IU/L (ref 44–121)
BUN/Creatinine Ratio: 16 (ref 12–28)
BUN: 13 mg/dL (ref 8–27)
Bilirubin Total: 0.5 mg/dL (ref 0.0–1.2)
CO2: 21 mmol/L (ref 20–29)
Calcium: 9.4 mg/dL (ref 8.7–10.3)
Chloride: 94 mmol/L — ABNORMAL LOW (ref 96–106)
Creatinine, Ser: 0.8 mg/dL (ref 0.57–1.00)
Globulin, Total: 2 g/dL (ref 1.5–4.5)
Glucose: 85 mg/dL (ref 70–99)
Potassium: 4.7 mmol/L (ref 3.5–5.2)
Sodium: 130 mmol/L — ABNORMAL LOW (ref 134–144)
Total Protein: 6.5 g/dL (ref 6.0–8.5)
eGFR: 83 mL/min/{1.73_m2} (ref >59)

## 2023-03-08 LAB — THYROID PANEL WITH TSH
Free Thyroxine Index: 1.8 (ref 1.2–4.9)
T3 Uptake Ratio: 26 % (ref 24–39)
T4, Total: 7 ug/dL (ref 4.5–12.0)
TSH: 0.811 u[IU]/mL (ref 0.450–4.500)

## 2023-03-10 ENCOUNTER — Ambulatory Visit: Payer: Medicaid Other | Admitting: Physical Therapy

## 2023-03-10 DIAGNOSIS — M797 Fibromyalgia: Secondary | ICD-10-CM

## 2023-03-10 DIAGNOSIS — M542 Cervicalgia: Secondary | ICD-10-CM

## 2023-03-10 DIAGNOSIS — M6281 Muscle weakness (generalized): Secondary | ICD-10-CM | POA: Diagnosis not present

## 2023-03-10 DIAGNOSIS — G8929 Other chronic pain: Secondary | ICD-10-CM
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
## 2023-03-11 NOTE — Progress Notes (Signed)
Labs are normal/stable.

## 2023-03-12 ENCOUNTER — Ambulatory Visit: Payer: Medicaid Other | Admitting: Physical Therapy

## 2023-03-12 DIAGNOSIS — G8929 Other chronic pain: Secondary | ICD-10-CM

## 2023-03-12 DIAGNOSIS — M6281 Muscle weakness (generalized): Secondary | ICD-10-CM

## 2023-03-12 DIAGNOSIS — M542 Cervicalgia: Secondary | ICD-10-CM

## 2023-03-12 DIAGNOSIS — M797 Fibromyalgia: Secondary | ICD-10-CM
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
## 2023-03-17 ENCOUNTER — Ambulatory Visit: Payer: Medicaid Other | Admitting: Physical Therapy

## 2023-03-17 ENCOUNTER — Other Ambulatory Visit: Payer: Self-pay | Admitting: Nurse Practitioner

## 2023-03-17 NOTE — Progress Notes (Deleted)
   CC:  memory loss  Follow-up Visit  Last visit: 08/20/22  Brief HPI: 62 year old female with a history of anxiety, depression, HTN, fibromyalgia who follows in clinic for memory loss. Brain MRI 02/08/22 showed chronic microvascular ischemic changes and was otherwise unremarkable.   At her last visit, Cymbalta was decreased to 40 mg daily. Interval History: ***   Physical Exam:   Vital Signs: There were no vitals taken for this visit. GENERAL:  well appearing, in no acute distress, alert  SKIN:  Color, texture, turgor normal. No rashes or lesions HEAD:  Normocephalic/atraumatic. RESP: normal respiratory effort MSK:  No gross joint deformities.   NEUROLOGICAL: Mental Status: Alert, oriented to person, place and time, Follows commands, and Speech fluent and appropriate. Cranial Nerves: PERRL, face symmetric, no dysarthria, hearing grossly intact Motor: moves all extremities equally Gait: normal-based.  IMPRESSION: ***  PLAN: ***   Follow-up: ***  I spent a total of *** minutes on the date of the service. Discussed medication side effects, adverse reactions and drug interactions. Written educational materials and patient instructions outlining all of the above were given.  Ocie Doyne, MD

## 2023-03-18 NOTE — Telephone Encounter (Signed)
Requested Prescriptions  Pending Prescriptions Disp Refills   meloxicam (MOBIC) 15 MG tablet [Pharmacy Med Name: MELOXICAM 15 MG TABLET] 90 tablet 0    Sig: TAKE 1 TABLET BY MOUTH EVERY DAY AS NEEDED FOR PAIN     Analgesics:  COX2 Inhibitors Failed - 03/17/2023  3:07 PM      Failed - Manual Review: Labs are only required if the patient has taken medication for more than 8 weeks.      Passed - HGB in normal range and within 360 days    Hemoglobin  Date Value Ref Range Status  03/07/2023 12.6 11.1 - 15.9 g/dL Final         Passed - Cr in normal range and within 360 days    Creatinine, Ser  Date Value Ref Range Status  03/07/2023 0.80 0.57 - 1.00 mg/dL Final         Passed - HCT in normal range and within 360 days    Hematocrit  Date Value Ref Range Status  03/07/2023 36.3 34.0 - 46.6 % Final         Passed - AST in normal range and within 360 days    AST  Date Value Ref Range Status  03/07/2023 19 0 - 40 IU/L Final         Passed - ALT in normal range and within 360 days    ALT  Date Value Ref Range Status  03/07/2023 20 0 - 32 IU/L Final         Passed - eGFR is 30 or above and within 360 days    GFR calc Af Amer  Date Value Ref Range Status  09/27/2020 101 >59 mL/min/1.73 Final    Comment:    **In accordance with recommendations from the NKF-ASN Task force,**   [facility] is in the process of updating its eGFR calculation to the   2021 CKD-EPI creatinine equation that estimates kidney function   without a race variable.    GFR, Estimated  Date Value Ref Range Status  01/20/2023 >60 >60 mL/min Final    Comment:    (NOTE) Calculated using the CKD-EPI Creatinine Equation (2021)    eGFR  Date Value Ref Range Status  03/07/2023 83 >59 mL/min/1.73 Final         Passed - Patient is not pregnant      Passed - Valid encounter within last 12 months    Recent Outpatient Visits           1 week ago Mixed hyperlipidemia   [place] [facility]  Mecum, Oswaldo Conroy, PA-C   2 months ago Hyponatremia   [place] [facility] Pearley, Sherran Needs, NP   2 months ago Oral candida   [place] [facility] Larae Grooms, NP   3 months ago Oral candida   [place] [facility] Pearley, Sherran Needs, NP   3 months ago Aortic atherosclerosis [facility])   [facility] Larae Grooms, NP       Future Appointments             In 1 month Chandrasekhar, Rondel Jumbo, MD [facility]   In 2 months Altamese [place], MD [facility] Endocrinology

## 2023-03-19 ENCOUNTER — Ambulatory Visit: Payer: Medicaid Other | Admitting: Psychiatry

## 2023-03-19 ENCOUNTER — Encounter: Payer: Self-pay | Admitting: Psychiatry

## 2023-03-19 ENCOUNTER — Ambulatory Visit: Payer: Medicaid Other | Admitting: Physical Therapy

## 2023-03-20 ENCOUNTER — Other Ambulatory Visit: Payer: Self-pay | Admitting: Physician Assistant

## 2023-03-20 DIAGNOSIS — L309 Dermatitis, unspecified: Secondary | ICD-10-CM

## 2023-03-21 NOTE — Telephone Encounter (Signed)
Requested medication (s) are due for refill today:   No   Discontinued 01/08/2023 by another provider  Requested medication (s) are on the active medication list:   No  Future visit scheduled:   No   Last ordered: Discontinued 01/08/2023  Non delegated medicine that has been discontinued.     Requested Prescriptions  Pending Prescriptions Disp Refills   triamcinolone cream (KENALOG) 0.1 % [Pharmacy Med Name: TRIAMCINOLONE 0.1% CREAM] 30 g 0    Sig: APPLY TO AFFECTED AREA TWICE A DAY     Not Delegated - Dermatology:  Corticosteroids Failed - 03/20/2023 11:46 AM      Failed - This refill cannot be delegated      Passed - Valid encounter within last 12 months    Recent Outpatient Visits           2 weeks ago Mixed hyperlipidemia   [place] [facility] Mecum, Oswaldo Conroy, PA-C   2 months ago Hyponatremia   [place] [facility] Gardiner, Sherran Needs, NP   2 months ago Oral candida   [place] [facility] Larae Grooms, NP   3 months ago Oral candida   [place] [facility] Pearley, Sherran Needs, NP   3 months ago Aortic atherosclerosis [facility])   [place] [facility] Larae Grooms, NP       Future Appointments             In 4 weeks Izora Ribas, Rondel Jumbo, MD [facility] at [facility]   In 2 months Altamese [place], MD [facility] Endocrinology

## 2023-03-24 ENCOUNTER — Encounter: Payer: Self-pay | Admitting: Physical Therapy

## 2023-03-24 ENCOUNTER — Ambulatory Visit: Payer: Medicaid Other | Attending: Orthopedic Surgery | Admitting: Physical Therapy

## 2023-03-24 DIAGNOSIS — M6281 Muscle weakness (generalized): Secondary | ICD-10-CM | POA: Insufficient documentation

## 2023-03-24 DIAGNOSIS — G8929 Other chronic pain: Secondary | ICD-10-CM | POA: Insufficient documentation

## 2023-03-24 DIAGNOSIS — M542 Cervicalgia: Secondary | ICD-10-CM | POA: Diagnosis present

## 2023-03-24 DIAGNOSIS — M797 Fibromyalgia: Secondary | ICD-10-CM | POA: Diagnosis present

## 2023-03-24 DIAGNOSIS — M25511 Pain in right shoulder: Secondary | ICD-10-CM | POA: Insufficient documentation
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
## 2023-03-26 ENCOUNTER — Encounter: Payer: Self-pay | Admitting: Physical Therapy

## 2023-03-26 ENCOUNTER — Ambulatory Visit: Payer: Medicaid Other | Admitting: Physical Therapy

## 2023-03-26 DIAGNOSIS — M542 Cervicalgia: Secondary | ICD-10-CM

## 2023-03-26 DIAGNOSIS — M797 Fibromyalgia: Secondary | ICD-10-CM

## 2023-03-26 DIAGNOSIS — G8929 Other chronic pain: Secondary | ICD-10-CM

## 2023-03-26 DIAGNOSIS — M6281 Muscle weakness (generalized): Secondary | ICD-10-CM

## 2023-03-26 DIAGNOSIS — M25511 Pain in right shoulder: Secondary | ICD-10-CM | POA: Diagnosis not present
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
## 2023-03-28 ENCOUNTER — Other Ambulatory Visit: Payer: Self-pay | Admitting: Internal Medicine

## 2023-03-31 ENCOUNTER — Encounter: Payer: Self-pay | Admitting: Physical Therapy

## 2023-03-31 ENCOUNTER — Ambulatory Visit: Payer: Medicaid Other | Admitting: Physical Therapy

## 2023-03-31 VITALS — BP 141/73

## 2023-03-31 DIAGNOSIS — M797 Fibromyalgia: Secondary | ICD-10-CM

## 2023-03-31 DIAGNOSIS — M6281 Muscle weakness (generalized): Secondary | ICD-10-CM

## 2023-03-31 DIAGNOSIS — G8929 Other chronic pain: Secondary | ICD-10-CM

## 2023-03-31 DIAGNOSIS — M542 Cervicalgia: Secondary | ICD-10-CM

## 2023-03-31 DIAGNOSIS — M25511 Pain in right shoulder: Secondary | ICD-10-CM | POA: Diagnosis not present
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
## 2023-04-02 ENCOUNTER — Ambulatory Visit: Payer: Medicaid Other | Admitting: Physical Therapy

## 2023-04-02 VITALS — BP 111/64

## 2023-04-02 DIAGNOSIS — M25511 Pain in right shoulder: Secondary | ICD-10-CM | POA: Diagnosis not present

## 2023-04-02 DIAGNOSIS — G8929 Other chronic pain: Secondary | ICD-10-CM

## 2023-04-02 DIAGNOSIS — M542 Cervicalgia: Secondary | ICD-10-CM

## 2023-04-02 DIAGNOSIS — M797 Fibromyalgia: Secondary | ICD-10-CM

## 2023-04-02 DIAGNOSIS — M6281 Muscle weakness (generalized): Secondary | ICD-10-CM
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
## 2023-04-07 ENCOUNTER — Encounter: Payer: Self-pay | Admitting: Physical Therapy

## 2023-04-08 ENCOUNTER — Encounter: Payer: Self-pay | Admitting: Physical Therapy

## 2023-04-08 ENCOUNTER — Ambulatory Visit: Payer: Medicaid Other | Admitting: Physical Therapy

## 2023-04-08 DIAGNOSIS — M6281 Muscle weakness (generalized): Secondary | ICD-10-CM

## 2023-04-08 DIAGNOSIS — M25511 Pain in right shoulder: Secondary | ICD-10-CM | POA: Diagnosis not present

## 2023-04-08 DIAGNOSIS — G8929 Other chronic pain: Secondary | ICD-10-CM

## 2023-04-08 DIAGNOSIS — M797 Fibromyalgia: Secondary | ICD-10-CM

## 2023-04-08 DIAGNOSIS — M542 Cervicalgia: Secondary | ICD-10-CM
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
## 2023-04-09 ENCOUNTER — Encounter: Payer: Self-pay | Admitting: Obstetrics and Gynecology

## 2023-04-09 ENCOUNTER — Ambulatory Visit (INDEPENDENT_AMBULATORY_CARE_PROVIDER_SITE_OTHER): Payer: Medicaid Other | Admitting: Obstetrics and Gynecology

## 2023-04-09 VITALS — BP 144/86 | HR 52 | Ht 68.0 in | Wt 119.7 lb

## 2023-04-09 DIAGNOSIS — Z79890 Hormone replacement therapy (postmenopausal): Secondary | ICD-10-CM

## 2023-04-09 DIAGNOSIS — Z01419 Encounter for gynecological examination (general) (routine) without abnormal findings: Secondary | ICD-10-CM | POA: Diagnosis not present

## 2023-04-09 MED ORDER — ESTRADIOL 0.5 MG PO TABS: ORAL_TABLET | ORAL | 3 refills | Status: DC

## 2023-04-09 NOTE — Progress Notes (Signed)
Patients presents for annual exam today. She states daily use of HRT, would like to continue. Patient is up to date on pap smear and mammogram. Annual labs are ordered. She states no other questions or concerns at this time.
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
## 2023-04-10 ENCOUNTER — Encounter: Payer: Self-pay | Admitting: Physical Therapy

## 2023-04-10 ENCOUNTER — Ambulatory Visit: Payer: Medicaid Other | Admitting: Primary Care

## 2023-04-10 ENCOUNTER — Other Ambulatory Visit: Payer: Medicaid Other

## 2023-04-10 ENCOUNTER — Ambulatory Visit: Payer: Medicaid Other | Admitting: Physical Therapy

## 2023-04-10 ENCOUNTER — Ambulatory Visit: Payer: Medicaid Other | Admitting: Adult Health

## 2023-04-10 DIAGNOSIS — M25511 Pain in right shoulder: Secondary | ICD-10-CM | POA: Diagnosis not present

## 2023-04-10 DIAGNOSIS — M797 Fibromyalgia: Secondary | ICD-10-CM

## 2023-04-10 DIAGNOSIS — M542 Cervicalgia: Secondary | ICD-10-CM

## 2023-04-10 DIAGNOSIS — G8929 Other chronic pain: Secondary | ICD-10-CM

## 2023-04-10 DIAGNOSIS — M6281 Muscle weakness (generalized): Secondary | ICD-10-CM
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
## 2023-04-14 ENCOUNTER — Other Ambulatory Visit: Payer: Medicaid Other

## 2023-04-15 ENCOUNTER — Ambulatory Visit: Payer: Medicaid Other

## 2023-04-15 DIAGNOSIS — M6281 Muscle weakness (generalized): Secondary | ICD-10-CM

## 2023-04-15 DIAGNOSIS — G8929 Other chronic pain: Secondary | ICD-10-CM

## 2023-04-15 DIAGNOSIS — M797 Fibromyalgia: Secondary | ICD-10-CM

## 2023-04-15 DIAGNOSIS — M542 Cervicalgia: Secondary | ICD-10-CM

## 2023-04-15 DIAGNOSIS — M25511 Pain in right shoulder: Secondary | ICD-10-CM | POA: Diagnosis not present

## 2023-04-15 LAB — BASIC METABOLIC PANEL
BUN/Creatinine Ratio: 7 — ABNORMAL LOW (ref 12–28)
BUN: 5 mg/dL — ABNORMAL LOW (ref 8–27)
CO2: 22 mmol/L (ref 20–29)
Calcium: 9.6 mg/dL (ref 8.7–10.3)
Chloride: 86 mmol/L — ABNORMAL LOW (ref 96–106)
Creatinine, Ser: 0.74 mg/dL (ref 0.57–1.00)
Glucose: 91 mg/dL (ref 70–99)
Potassium: 5.3 mmol/L — ABNORMAL HIGH (ref 3.5–5.2)
Sodium: 121 mmol/L — ABNORMAL LOW (ref 134–144)
eGFR: 91 mL/min/{1.73_m2} (ref >59)

## 2023-04-15 LAB — CBC
Hematocrit: 38 % (ref 34.0–46.6)
Hemoglobin: 13.3 g/dL (ref 11.1–15.9)
MCH: 33.6 pg — ABNORMAL HIGH (ref 26.6–33.0)
MCHC: 35 g/dL (ref 31.5–35.7)
MCV: 96 fL (ref 79–97)
Platelets: 328 10*3/uL (ref 150–450)
RBC: 3.96 x10E6/uL (ref 3.77–5.28)
RDW: 11.8 % (ref 11.7–15.4)
WBC: 4.9 10*3/uL (ref 3.4–10.8)

## 2023-04-15 LAB — TSH: TSH: 0.688 u[IU]/mL (ref 0.450–4.500)

## 2023-04-15 LAB — LIPID PANEL
Chol/HDL Ratio: 2.2 ratio (ref 0.0–4.4)
Cholesterol, Total: 198 mg/dL (ref 100–199)
HDL: 92 mg/dL (ref >39)
LDL Chol Calc (NIH): 87 mg/dL (ref 0–99)
Triglycerides: 113 mg/dL (ref 0–149)
VLDL Cholesterol Cal: 19 mg/dL (ref 5–40)

## 2023-04-15 LAB — HEMOGLOBIN A1C
Est. average glucose Bld gHb Est-mCnc: 111 mg/dL
Hgb A1c MFr Bld: 5.5 % (ref 4.8–5.6)
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
## 2023-04-16 ENCOUNTER — Other Ambulatory Visit: Payer: Self-pay | Admitting: Psychiatry

## 2023-04-17 ENCOUNTER — Ambulatory Visit: Payer: Medicaid Other | Admitting: Physical Therapy

## 2023-04-17 DIAGNOSIS — M25511 Pain in right shoulder: Secondary | ICD-10-CM | POA: Diagnosis not present

## 2023-04-17 DIAGNOSIS — M542 Cervicalgia: Secondary | ICD-10-CM

## 2023-04-17 DIAGNOSIS — M797 Fibromyalgia: Secondary | ICD-10-CM

## 2023-04-17 DIAGNOSIS — G8929 Other chronic pain: Secondary | ICD-10-CM

## 2023-04-17 DIAGNOSIS — M6281 Muscle weakness (generalized): Secondary | ICD-10-CM
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
## 2023-04-18 ENCOUNTER — Ambulatory Visit: Payer: Medicaid Other | Attending: Internal Medicine | Admitting: Internal Medicine

## 2023-04-18 ENCOUNTER — Ambulatory Visit (INDEPENDENT_AMBULATORY_CARE_PROVIDER_SITE_OTHER): Payer: Medicaid Other

## 2023-04-18 ENCOUNTER — Encounter: Payer: Self-pay | Admitting: Internal Medicine

## 2023-04-18 VITALS — BP 140/60 | HR 64 | Ht 68.0 in | Wt 121.0 lb

## 2023-04-18 DIAGNOSIS — E861 Hypovolemia: Secondary | ICD-10-CM

## 2023-04-18 DIAGNOSIS — R002 Palpitations: Secondary | ICD-10-CM | POA: Diagnosis not present

## 2023-04-18 DIAGNOSIS — E871 Hypo-osmolality and hyponatremia: Secondary | ICD-10-CM | POA: Diagnosis not present

## 2023-04-18 NOTE — Progress Notes (Unsigned)
Applied a 14 day Zio XT monitor to patient in the office ?

## 2023-04-18 NOTE — Patient Instructions (Signed)
Medication Instructions:  Your physician recommends that you continue on your current medications as directed. Please refer to the Current Medication list given to you today.  *If you need a refill on your cardiac medications before your next appointment, please call your pharmacy*   Lab Work: NONE If you have labs (blood work) drawn today and your tests are completely normal, you will receive your results only by: MyChart Message (if you have MyChart) OR A paper copy in the mail If you have any lab test that is abnormal or we need to change your treatment, we will call you to review the results.   Testing/Procedures: Your physician would like for you to wear a 2 week heart monitor.  Your physician has referred you to see a Nephrologist.     Follow-Up: At [facility], you and your health needs are our priority.  As part of our continuing mission to provide you with exceptional heart care, we have created designated Provider Care Teams.  These Care Teams include your primary Cardiologist (physician) and Advanced Practice Providers (APPs -  Physician Assistants and Nurse Practitioners) who all work together to provide you with the care you need, when you need it.   Your next appointment:   1 year(s)  Provider:   Christell Constant, MD     Other Instructions Christena Deem- Long Term Monitor Instructions  Your physician has requested you wear a ZIO patch monitor for 14 days.  This is a single patch monitor. [employer] supplies one patch monitor per enrollment. Additional stickers are not available. Please do not apply patch if you will be having a Nuclear Stress Test,  Echocardiogram, Cardiac CT, MRI, or Chest Xray during the period you would be wearing the  monitor. The patch cannot be worn during these tests. You cannot remove and re-apply the  ZIO XT patch monitor.  Your ZIO patch monitor will be mailed 3 day USPS to your address on file. It may take 3-5 days  to receive  your monitor after you have been enrolled.  Once you have received your monitor, please review the enclosed instructions. Your monitor  has already been registered assigning a specific monitor serial # to you.  Billing and Patient Assistance Program Information  We have supplied [employer] with any of your insurance information on file for billing purposes. [employer] offers a sliding scale Patient Assistance Program for patients that do not have  insurance, or whose insurance does not completely cover the cost of the ZIO monitor.  You must apply for the Patient Assistance Program to qualify for this discounted rate.  To apply, please call [employer] at [phone], select option 4, select option 2, ask to apply for  Patient Assistance Program. Meredeth Ide will ask your household income, and how many people  are in your household. They will quote your out-of-pocket cost based on that information.  [employer] will also be able to set up a 80-month, interest-free payment plan if needed.  Applying the monitor   Shave hair from upper left chest.  Hold abrader disc by orange tab. Rub abrader in 40 strokes over the upper left chest as  indicated in your monitor instructions.  Clean area with 4 enclosed alcohol pads. Let dry.  Apply patch as indicated in monitor instructions. Patch will be placed under collarbone on left  side of chest with arrow pointing upward.  Rub patch adhesive wings for 2 minutes. Remove white label marked "1". Remove the white  label marked "2".  Rub patch adhesive wings for 2 additional minutes.  While looking in a mirror, press and release button in center of patch. A small green light will  flash 3-4 times. This will be your only indicator that the monitor has been turned on.  Do not shower for the first 24 hours. You may shower after the first 24 hours.  Press the button if you feel a symptom. You will hear a small click. Record Date, Time and  Symptom in the Patient Logbook.  When  you are ready to remove the patch, follow instructions on the last 2 pages of Patient  Logbook. Stick patch monitor onto the last page of Patient Logbook.  Place Patient Logbook in the blue and white box. Use locking tab on box and tape box closed  securely. The blue and white box has prepaid postage on it. Please place it in the mailbox as  soon as possible. Your physician should have your test results approximately 7 days after the  monitor has been mailed back to [facility].  Call [facility] Customer Care at [phone] if you have questions regarding  your ZIO XT patch monitor. Call them immediately if you see an orange light blinking on your  monitor.  If your monitor falls off in less than 4 days, contact our Monitor department at [phone].  If your monitor becomes loose or falls off after 4 days call [employer] at [phone] for  suggestions on securing your monitor

## 2023-04-24 ENCOUNTER — Ambulatory Visit: Payer: Medicaid Other | Attending: Orthopedic Surgery | Admitting: Physical Therapy

## 2023-04-24 ENCOUNTER — Encounter: Payer: Self-pay | Admitting: Physical Therapy

## 2023-04-24 ENCOUNTER — Ambulatory Visit: Payer: Medicaid Other | Admitting: Pediatrics

## 2023-04-24 ENCOUNTER — Encounter: Payer: Self-pay | Admitting: Pediatrics

## 2023-04-24 VITALS — BP 146/74 | HR 56 | Temp 98.1°F | Wt 124.0 lb

## 2023-04-24 DIAGNOSIS — M6281 Muscle weakness (generalized): Secondary | ICD-10-CM | POA: Diagnosis present

## 2023-04-24 DIAGNOSIS — R7989 Other specified abnormal findings of blood chemistry: Secondary | ICD-10-CM | POA: Diagnosis not present

## 2023-04-24 DIAGNOSIS — R350 Frequency of micturition: Secondary | ICD-10-CM

## 2023-04-24 DIAGNOSIS — M25511 Pain in right shoulder: Secondary | ICD-10-CM | POA: Insufficient documentation

## 2023-04-24 DIAGNOSIS — M797 Fibromyalgia: Secondary | ICD-10-CM | POA: Diagnosis present

## 2023-04-24 DIAGNOSIS — E871 Hypo-osmolality and hyponatremia: Secondary | ICD-10-CM

## 2023-04-24 DIAGNOSIS — E875 Hyperkalemia: Secondary | ICD-10-CM | POA: Diagnosis not present

## 2023-04-24 DIAGNOSIS — M542 Cervicalgia: Secondary | ICD-10-CM | POA: Diagnosis present

## 2023-04-24 DIAGNOSIS — G8929 Other chronic pain: Secondary | ICD-10-CM | POA: Insufficient documentation

## 2023-04-24 LAB — URINALYSIS, ROUTINE W REFLEX MICROSCOPIC
Bilirubin, UA: NEGATIVE
Glucose, UA: NEGATIVE
Ketones, UA: NEGATIVE
Leukocytes,UA: NEGATIVE
Nitrite, UA: NEGATIVE
Protein,UA: NEGATIVE
RBC, UA: NEGATIVE
Specific Gravity, UA: 1.01 (ref 1.005–1.030)
Urobilinogen, Ur: 0.2 mg/dL (ref 0.2–1.0)
pH, UA: 7.5 (ref 5.0–7.5)

## 2023-04-24 NOTE — Patient Instructions (Signed)
Great to meet you! I'll be in touch with your lab results.

## 2023-04-24 NOTE — Progress Notes (Signed)
BP (!) 146/74   Pulse (!) 56   Temp 98.1 F (36.7 C) (Oral)   Wt 124 lb (56.2 kg)   SpO2 100%   BMI 18.85 kg/m    Subjective:    Patient ID: Amber Livingston, female    DOB: 10-Oct-1960, 62 y.o.   MRN: [id number]  HPI: Amber Livingston is a 63 y.o. female  Chief Complaint  Patient presents with   Dysuria    Pt believes she has a Uti or kidney infection    URINARY SYMPTOMS Recent blood work made her concerned about urine/kidney, would like a check Dysuria: no, feels abdominal sensation with urination for the last week Urinary frequency: yes Urgency: yes Small volume voids: no Symptom severity:  bothersome Urinary incontinence:  no Foul odor: no Hematuria: no Abdominal pain:  no but feels uterine cramping Back pain: no Suprapubic pain/pressure: yes Flank pain:  chronic pain Fever:  no Vomiting: no Status: stable Previous urinary tract infection: no Recurrent urinary tract infection: no Sexual activity: No sexually active/monogomous/practicing safe sex; has not been sexually active in a year History of sexually transmitted disease: no Treatments attempted: none    Relevant past medical, surgical, family and social history reviewed and updated as indicated. Interim medical history since our last visit reviewed. Allergies and medications reviewed and updated.  ROS per HPI unless specifically indicated above.     Objective:    BP (!) 146/74   Pulse (!) 56   Temp 98.1 F (36.7 C) (Oral)   Wt 124 lb (56.2 kg)   SpO2 100%   BMI 18.85 kg/m   Wt Readings from Last 3 Encounters:  04/24/23 124 lb (56.2 kg)  04/18/23 121 lb (54.9 kg)  04/09/23 119 lb 11.2 oz (54.3 kg)     Physical Exam: GEN: alert, cooperative, and in NAD HENT: atraumatic, normocephalic EYES: anicteric sclera  CV: hemodynamically stable, RRR RESP: breathing comfortably on room air GI/ABD: soft, non-tender, non-distended, no CVA tenderness  EXT: warm and well perfused, no lower extremity edema,  neurovascularly intact PSYCH: normal behavior and appropriate affect  Results for orders placed or performed in visit on 04/24/23  TSH  Result Value Ref Range   TSH 0.849 0.450 - 4.500 uIU/mL  T4, free  Result Value Ref Range   Free T4 1.04 0.82 - 1.77 ng/dL  Comp Met (CMET)  Result Value Ref Range   Glucose 76 70 - 99 mg/dL   BUN 6 (L) 8 - 27 mg/dL   Creatinine, Ser 4.09 0.57 - 1.00 mg/dL   eGFR 96 >81 XB/JYN/8.29   BUN/Creatinine Ratio 8 (L) 12 - 28   Sodium 128 (L) 134 - 144 mmol/L   Potassium 4.5 3.5 - 5.2 mmol/L   Chloride 94 (L) 96 - 106 mmol/L   CO2 21 20 - 29 mmol/L   Calcium 9.4 8.7 - 10.3 mg/dL   Total Protein 6.7 6.0 - 8.5 g/dL   Albumin 4.5 3.9 - 4.9 g/dL   Globulin, Total 2.2 1.5 - 4.5 g/dL   Bilirubin Total 0.5 0.0 - 1.2 mg/dL   Alkaline Phosphatase 58 44 - 121 IU/L   AST 18 0 - 40 IU/L   ALT 16 0 - 32 IU/L  Urinalysis, Routine w reflex microscopic  Result Value Ref Range   Specific Gravity, UA 1.010 1.005 - 1.030   pH, UA 7.5 5.0 - 7.5   Color, UA Yellow Yellow   Appearance Ur Clear Clear   Leukocytes,UA Negative Negative  Protein,UA Negative Negative/Trace   Glucose, UA Negative Negative   Ketones, UA Negative Negative   RBC, UA Negative Negative   Bilirubin, UA Negative Negative   Urobilinogen, Ur 0.2 0.2 - 1.0 mg/dL   Nitrite, UA Negative Negative   Microscopic Examination Comment       Assessment & Plan:   Assessment & Plan    Amber Livingston was seen today for dysuria.  Diagnoses and all orders for this visit:  Urinary frequency Unclear if symptoms are true UTI as they seem nonspecific. Will screen as below. Suspect frequency due to excess water consumption (given h/o hyponatremia and recs for fluid restriction) but will wait for results. -     Urinalysis, Routine w reflex microscopic -     Urine Culture  Hyperkalemia Noted on blood work, plan to repeat today. -     Comp Met (CMET)  Elevated serum free T4 level Noted during recent hospital  admission. Has upcoming endocrinology appointment. -     TSH -     T4, free  Hyponatremia Noted during recent admission, recommended fluid restriction but unclear etiology. Plan to repeat labs today and determine if further workup/monitor needed. She is asymptomatic today. Reviewed signs of symptomatic hyponatremia. Pt given strict return precautions and anticipatory guidance for ED if develops new symptoms.  Follow up plan: Return in about 6 months (around 10/22/2023).  Amber Egger Howell Pringle, MD Family Medicine
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
## 2023-04-25 ENCOUNTER — Other Ambulatory Visit: Payer: Self-pay | Admitting: Pediatrics

## 2023-04-25 DIAGNOSIS — E871 Hypo-osmolality and hyponatremia: Secondary | ICD-10-CM

## 2023-04-25 LAB — COMPREHENSIVE METABOLIC PANEL
ALT: 16 IU/L (ref 0–32)
AST: 18 IU/L (ref 0–40)
Albumin: 4.5 g/dL (ref 3.9–4.9)
Alkaline Phosphatase: 58 IU/L (ref 44–121)
BUN/Creatinine Ratio: 8 — ABNORMAL LOW (ref 12–28)
BUN: 6 mg/dL — ABNORMAL LOW (ref 8–27)
Bilirubin Total: 0.5 mg/dL (ref 0.0–1.2)
CO2: 21 mmol/L (ref 20–29)
Calcium: 9.4 mg/dL (ref 8.7–10.3)
Chloride: 94 mmol/L — ABNORMAL LOW (ref 96–106)
Creatinine, Ser: 0.71 mg/dL (ref 0.57–1.00)
Globulin, Total: 2.2 g/dL (ref 1.5–4.5)
Glucose: 76 mg/dL (ref 70–99)
Potassium: 4.5 mmol/L (ref 3.5–5.2)
Sodium: 128 mmol/L — ABNORMAL LOW (ref 134–144)
Total Protein: 6.7 g/dL (ref 6.0–8.5)
eGFR: 96 mL/min/{1.73_m2} (ref >59)

## 2023-04-25 LAB — T4, FREE: Free T4: 1.04 ng/dL (ref 0.82–1.77)

## 2023-04-25 LAB — TSH: TSH: 0.849 u[IU]/mL (ref 0.450–4.500)

## 2023-04-25 NOTE — Progress Notes (Signed)
Future orders for hyponatremia

## 2023-04-26 LAB — URINE CULTURE

## 2023-04-28 ENCOUNTER — Ambulatory Visit: Payer: Medicaid Other | Admitting: Physical Therapy

## 2023-04-28 ENCOUNTER — Encounter: Payer: Self-pay | Admitting: Physical Therapy

## 2023-04-28 DIAGNOSIS — M542 Cervicalgia: Secondary | ICD-10-CM

## 2023-04-28 DIAGNOSIS — G8929 Other chronic pain: Secondary | ICD-10-CM

## 2023-04-28 DIAGNOSIS — M25511 Pain in right shoulder: Secondary | ICD-10-CM | POA: Diagnosis not present

## 2023-04-28 DIAGNOSIS — M6281 Muscle weakness (generalized): Secondary | ICD-10-CM

## 2023-04-28 DIAGNOSIS — M797 Fibromyalgia: Secondary | ICD-10-CM
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
## 2023-04-29 ENCOUNTER — Ambulatory Visit: Payer: Self-pay

## 2023-04-29 NOTE — Telephone Encounter (Signed)
Chief Complaint: Thrush symptoms Symptoms: sore throat, difficulty swallowing, neck pain  Frequency: constant  Pertinent Negatives: Patient denies SOB, chest pain, abdominal pain  Disposition: [] ED /[] Urgent Care (no appt availability in office) / [x] Appointment(In office/virtual)/ []  [facility]/ [] Home Care/ [] Refused Recommended Disposition /[] [facility]/ []  Follow-up with PCP Additional Notes: Patient states she has a history of esophageal thrush and reports she is having another flare up for the 3rd time. Patient reports difficulty swallowing, neck pain, white patches in the back of the mouth. Patient states she is keeping her dentures clean and doing all that she can to make sure it doesn't get worse. Patient was given care advice and has an appointment scheduled with PCP tomorrow at 1400. Patient is requesting an Rx for fluconazole be sent to her pharmacy today to start treatment. Advised patient if symptoms get worse before her scheduled appointment to seek care at Urgent care or the ED. Patient stated she is not going to urgent care or the ED, she wants the Rx sent to the pharmacy now. Advised I would forward message to provider for additional recommendations.   Summary: poss thrush/wanting medication   Pt states her thrush has gotten worse.  She is wanting another course of fluconazole (DIFLUCAN) 150 MG tablet. She thinks she may be getting a fever. Pt made appt for tomorrow, but wants someone to call her if they will send Rx in.     Reason for Disposition  [1] White patches that stick to tongue or inner cheek AND [2] can be wiped off  Answer Assessment - Initial Assessment Questions 1. SYMPTOM: "What's the main symptom you're concerned about?" (e.g., chapped lips, dry mouth, lump, sores)     Sore throat, Horse voice,  2. ONSET: "When did the symptoms start?"     Sunday  3. PAIN: "Is there any pain?" If Yes, ask: "How bad is it?" (Scale: 1-10; mild, moderate,  severe)   - MILD (1-3):  doesn't interfere with eating or normal activities   - MODERATE (4-7): interferes with eating some solids and normal activities   - SEVERE (8-10):  excruciating pain, interferes with most normal activities   - SEVERE DYSPHAGIA: can't swallow liquids, drooling     4/10 4. CAUSE: "What do you think is causing the symptoms?"     I have reoccurring thrush   5. OTHER SYMPTOMS: "Do you have any other symptoms?" (e.g., fever, sore throat, toothache, swelling)     Difficulty swallowing, sore throat  Protocols used: Mouth Symptoms-A-AH

## 2023-04-30 ENCOUNTER — Ambulatory Visit: Payer: Medicaid Other | Admitting: Pediatrics

## 2023-04-30 ENCOUNTER — Ambulatory Visit: Payer: Medicaid Other | Admitting: Physical Therapy

## 2023-04-30 ENCOUNTER — Encounter: Payer: Self-pay | Admitting: Pediatrics

## 2023-04-30 ENCOUNTER — Encounter: Payer: Self-pay | Admitting: Physical Therapy

## 2023-04-30 VITALS — BP 177/93 | HR 53 | Temp 97.7°F | Wt 123.4 lb

## 2023-04-30 DIAGNOSIS — M25511 Pain in right shoulder: Secondary | ICD-10-CM | POA: Diagnosis not present

## 2023-04-30 DIAGNOSIS — G8929 Other chronic pain: Secondary | ICD-10-CM

## 2023-04-30 DIAGNOSIS — M549 Dorsalgia, unspecified: Secondary | ICD-10-CM | POA: Diagnosis not present

## 2023-04-30 DIAGNOSIS — M6281 Muscle weakness (generalized): Secondary | ICD-10-CM

## 2023-04-30 DIAGNOSIS — B370 Candidal stomatitis: Secondary | ICD-10-CM

## 2023-04-30 DIAGNOSIS — I10 Essential (primary) hypertension: Secondary | ICD-10-CM

## 2023-04-30 DIAGNOSIS — M542 Cervicalgia: Secondary | ICD-10-CM

## 2023-04-30 DIAGNOSIS — E871 Hypo-osmolality and hyponatremia: Secondary | ICD-10-CM

## 2023-04-30 DIAGNOSIS — B379 Candidiasis, unspecified: Secondary | ICD-10-CM

## 2023-04-30 MED ORDER — FLUCONAZOLE 150 MG PO TABS
150.0000 mg | ORAL_TABLET | Freq: Every day | ORAL | 0 refills | Status: AC
Start: 2023-04-30 — End: 2023-05-07

## 2023-04-30 MED ORDER — IBUPROFEN 600 MG PO TABS: 600.0000 mg | ORAL_TABLET | Freq: Three times a day (TID) | ORAL | 0 refills | Status: DC | PRN

## 2023-04-30 NOTE — Patient Instructions (Addendum)
Let me know how you are doing, if not improving  Hold meloxicam while you're on the higher ibuprofen, do not take more than 3 per 24 hours You can take tylenol 500mg  in between doses
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
## 2023-04-30 NOTE — Progress Notes (Signed)
BP (!) 177/93   Pulse (!) 53   Temp 97.7 F (36.5 C) (Oral)   Wt 123 lb 6.4 oz (56 kg)   SpO2 99%   BMI 18.76 kg/m    Subjective:    Patient ID: Amber Livingston, female    DOB: April 13, 1961, 62 y.o.   MRN: [id number]  HPI: Amber Livingston is a 62 y.o. female  Chief Complaint  Patient presents with   [name]    Started last Saturday    Throat dryness Patient reports she feels she is getting esophageal thrush again She has had changes in voice Feels dryness, it is keeping her awake at night Has had increased coughing from dryness in throat, no sputum Has not tried anything to keep throat moisturized She is using her stiolto inhaler every day She keeps all inhalers clean Not using pulmicort inhaler right now She tested for covid at home and was negative Denies fevers, chills, nausea, vomiting  HTN 120/83 at home this morning She reports she took one of her bp but is unsure which one She is asymptoamtic right now No headache, chest pain, sob, palpations, orthopnea, dyspnea, PND, lower extremity edema.  Sore Feels sore after PT Requesting pain medication to help with breakthrough pain w PT Taking mobic as needed with some benefit  Relevant past medical, surgical, family and social history reviewed and updated as indicated. Interim medical history since our last visit reviewed. Allergies and medications reviewed and updated.  ROS per HPI unless specifically indicated above     Objective:    BP (!) 177/93   Pulse (!) 53   Temp 97.7 F (36.5 C) (Oral)   Wt 123 lb 6.4 oz (56 kg)   SpO2 99%   BMI 18.76 kg/m   Wt Readings from Last 3 Encounters:  04/30/23 123 lb 6.4 oz (56 kg)  04/24/23 124 lb (56.2 kg)  04/18/23 121 lb (54.9 kg)    Physical Exam: GEN: alert, cooperative, and in NAD HENT: atraumatic, normocephalic, distal tongue with well circumscribed papules, ?satellite lesions at posterior oropharynx with trace white film  EYES: anicteric sclera  CV: hemodynamically  stable, RRR, no murmurs, rubs, gallops  RESP: breathing comfortably on room air, no wheezing, crackles, rales GI/ABD: soft, non-tender, non-distended  EXT: warm and well perfused, no lower extremity edema, neurovascularly intact PSYCH: normal behavior and appropriate affect  Assessment & Plan:  Assessment & Plan   Amber Livingston was seen today for thrush.  Diagnoses and all orders for this visit:  Candida infection Thrush Patient complaining of recurrent "esophageal thrush". Negative covid at [facility] with not many URI sx reported today so deferred on further swabs. I was able to see some possible satellite lesions in the posterior oropharynx to support but it is unclear. On initial chart review, I am unable to find confirmation of systemic fungal infection during recent admission. Notably had normal EGD bx findings at that time. She is symptomatic today and will treat with an extended course but counseled on uncertainty of exact cause of her symptoms as well as risks with taking diflucan with ongoing frequency. Will touch base with pulmonology, as I wonder if her symptoms may be due to respiratory issue and may benefit from bronchoscopy for further assessment. May also benefit from ID consult to help with counseling and diagnostic clarity. Pt given strict return precautions and anticipatory guidance for ED. -     fluconazole (DIFLUCAN) 150 MG tablet; Take 1 tablet (150 mg total) by mouth daily for  7 doses. -     Ambulatory referral to Infectious Disease  Hyponatremia Recent admission with unclear etiology. She did not complete all labs today, but will assess sodium levels and if still low will replace order for urine lytes and serum osms.  -     Comp Met (CMET)  Chronic back pain, unspecified back location, unspecified back pain laterality Had physical therapy today and feels increased soreness. Requesting short course of high dose ibuprofen. Instructed to hold mobic while she takes this medications.  Alternate with tylenol. -     ibuprofen (ADVIL) 600 MG tablet; Take 1 tablet (600 mg total) by mouth every 8 (eight) hours as needed.  Primary HTN Elevated BP today and last visit as well. Asymptomatic. She reports variable medication compliance though she checks at home and is normotensive. Will continue to check and bring readings at follow up visit. Encouraged medication compliance.   Follow up plan: Return if symptoms worsen or fail to improve.  [name], MD

## 2023-05-01 LAB — COMPREHENSIVE METABOLIC PANEL
ALT: 17 IU/L (ref 0–32)
AST: 19 IU/L (ref 0–40)
Albumin: 4.9 g/dL (ref 3.9–4.9)
Alkaline Phosphatase: 68 IU/L (ref 44–121)
BUN/Creatinine Ratio: 9 — ABNORMAL LOW (ref 12–28)
BUN: 6 mg/dL — ABNORMAL LOW (ref 8–27)
Bilirubin Total: 0.4 mg/dL (ref 0.0–1.2)
CO2: 20 mmol/L (ref 20–29)
Calcium: 9.5 mg/dL (ref 8.7–10.3)
Chloride: 90 mmol/L — ABNORMAL LOW (ref 96–106)
Creatinine, Ser: 0.7 mg/dL (ref 0.57–1.00)
Globulin, Total: 2.1 g/dL (ref 1.5–4.5)
Glucose: 76 mg/dL (ref 70–99)
Potassium: 4.6 mmol/L (ref 3.5–5.2)
Sodium: 123 mmol/L — ABNORMAL LOW (ref 134–144)
Total Protein: 7 g/dL (ref 6.0–8.5)
eGFR: 98 mL/min/{1.73_m2} (ref >59)

## 2023-05-05 ENCOUNTER — Encounter: Payer: Medicaid Other | Admitting: Physical Therapy

## 2023-05-07 ENCOUNTER — Other Ambulatory Visit: Payer: Self-pay | Admitting: Pediatrics

## 2023-05-07 ENCOUNTER — Encounter: Payer: Medicaid Other | Admitting: Physical Therapy

## 2023-05-07 DIAGNOSIS — E871 Hypo-osmolality and hyponatremia: Secondary | ICD-10-CM

## 2023-05-10 ENCOUNTER — Other Ambulatory Visit: Payer: Self-pay | Admitting: Nurse Practitioner

## 2023-05-10 ENCOUNTER — Other Ambulatory Visit: Payer: Self-pay | Admitting: Psychiatry

## 2023-05-10 ENCOUNTER — Other Ambulatory Visit: Payer: Self-pay | Admitting: Internal Medicine

## 2023-05-10 DIAGNOSIS — F419 Anxiety disorder, unspecified: Secondary | ICD-10-CM

## 2023-05-12 ENCOUNTER — Telehealth: Payer: Self-pay

## 2023-05-12 ENCOUNTER — Ambulatory Visit: Payer: Medicaid Other | Admitting: Physical Therapy

## 2023-05-12 ENCOUNTER — Other Ambulatory Visit: Payer: Self-pay

## 2023-05-12 ENCOUNTER — Other Ambulatory Visit: Payer: Medicaid Other

## 2023-05-12 ENCOUNTER — Encounter: Payer: Self-pay | Admitting: Physical Therapy

## 2023-05-12 DIAGNOSIS — M542 Cervicalgia: Secondary | ICD-10-CM

## 2023-05-12 DIAGNOSIS — M797 Fibromyalgia: Secondary | ICD-10-CM

## 2023-05-12 DIAGNOSIS — M6281 Muscle weakness (generalized): Secondary | ICD-10-CM

## 2023-05-12 DIAGNOSIS — G8929 Other chronic pain: Secondary | ICD-10-CM

## 2023-05-12 DIAGNOSIS — M25511 Pain in right shoulder: Secondary | ICD-10-CM | POA: Diagnosis not present

## 2023-05-12 DIAGNOSIS — E871 Hypo-osmolality and hyponatremia: Secondary | ICD-10-CM

## 2023-05-12 DIAGNOSIS — E039 Hypothyroidism, unspecified: Secondary | ICD-10-CM

## 2023-05-12 DIAGNOSIS — Z79890 Hormone replacement therapy (postmenopausal): Secondary | ICD-10-CM

## 2023-05-12 MED ORDER — MEDROXYPROGESTERONE ACETATE 2.5 MG PO TABS
2.5000 mg | ORAL_TABLET | Freq: Every day | ORAL | 3 refills | Status: DC
Start: 2023-05-12 — End: 2024-06-07

## 2023-05-12 NOTE — Telephone Encounter (Signed)
Refilled rx
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
## 2023-05-12 NOTE — Telephone Encounter (Signed)
Requested medication (s) are due for refill today:no  Requested medication (s) are on the active medication list: no  Last refill:  04/24/23  Future visit scheduled: yes  Notes to clinic:  med not delegated to NT to RF   Requested Prescriptions  Pending Prescriptions Disp Refills   QUEtiapine (SEROQUEL) 25 MG tablet [Pharmacy Med Name: QUETIAPINE FUMARATE 25 MG TAB] 30 tablet     Sig: TAKE 1 TABLET BY MOUTH EVERYDAY AT BEDTIME     Not Delegated - Psychiatry:  Antipsychotics - Second Generation (Atypical) - quetiapine Failed - 05/10/2023  7:35 AM      Failed - This refill cannot be delegated      Failed - Last BP in normal range    BP Readings from Last 1 Encounters:  04/30/23 (!) 177/93         Failed - Lipid Panel in normal range within the last 12 months    Cholesterol, Total  Date Value Ref Range Status  04/14/2023 198 100 - 199 mg/dL Final   LDL Chol Calc (NIH)  Date Value Ref Range Status  04/14/2023 87 0 - 99 mg/dL Final   HDL  Date Value Ref Range Status  04/14/2023 92 >39 mg/dL Final   Triglycerides  Date Value Ref Range Status  04/14/2023 113 0 - 149 mg/dL Final         Failed - CMP within normal limits and completed in the last 12 months    Albumin  Date Value Ref Range Status  04/30/2023 4.9 3.9 - 4.9 g/dL Final   Alkaline Phosphatase  Date Value Ref Range Status  04/30/2023 68 44 - 121 IU/L Final   ALT  Date Value Ref Range Status  04/30/2023 17 0 - 32 IU/L Final   AST  Date Value Ref Range Status  04/30/2023 19 0 - 40 IU/L Final   BUN  Date Value Ref Range Status  04/30/2023 6 (L) 8 - 27 mg/dL Final   Calcium  Date Value Ref Range Status  04/30/2023 9.5 8.7 - 10.3 mg/dL Final   CO2  Date Value Ref Range Status  04/30/2023 20 20 - 29 mmol/L Final   Creatinine, Ser  Date Value Ref Range Status  04/30/2023 0.70 0.57 - 1.00 mg/dL Final   Glucose  Date Value Ref Range Status  04/30/2023 76 70 - 99 mg/dL Final   Glucose, Bld  Date  Value Ref Range Status  01/20/2023 124 (H) 70 - 99 mg/dL Final    Comment:    Glucose reference range applies only to samples taken after fasting for at least 8 hours.   Glucose-Capillary  Date Value Ref Range Status  01/20/2023 134 (H) 70 - 99 mg/dL Final    Comment:    Glucose reference range applies only to samples taken after fasting for at least 8 hours.   Potassium  Date Value Ref Range Status  04/30/2023 4.6 3.5 - 5.2 mmol/L Final   Sodium  Date Value Ref Range Status  04/30/2023 123 (L) 134 - 144 mmol/L Final   Bilirubin Total  Date Value Ref Range Status  04/30/2023 0.4 0.0 - 1.2 mg/dL Final   Bilirubin, Direct  Date Value Ref Range Status  11/05/2021 <0.1 0.0 - 0.2 mg/dL Final   Indirect Bilirubin  Date Value Ref Range Status  11/05/2021 NOT CALCULATED 0.3 - 0.9 mg/dL Final    Comment:    Performed at [facility], [address], [place], [place] [address]  Protein, ur  Date Value Ref Range Status  11/30/2022 NEGATIVE NEGATIVE mg/dL Final   Protein,UA  Date Value Ref Range Status  04/24/2023 Negative Negative/Trace Final   Total Protein  Date Value Ref Range Status  04/30/2023 7.0 6.0 - 8.5 g/dL Final   GFR calc Af Amer  Date Value Ref Range Status  09/27/2020 101 >59 mL/min/1.73 Final    Comment:    **In accordance with recommendations from the NKF-ASN Task force,**   [facility] is in the process of updating its eGFR calculation to the   2021 CKD-EPI creatinine equation that estimates kidney function   without a race variable.    eGFR  Date Value Ref Range Status  04/30/2023 98 >59 mL/min/1.73 Final   GFR, Estimated  Date Value Ref Range Status  01/20/2023 >60 >60 mL/min Final    Comment:    (NOTE) Calculated using the CKD-EPI Creatinine Equation (2021)          Passed - TSH in normal range and within 360 days    TSH  Date Value Ref Range Status  04/24/2023 0.849 0.450 - 4.500 uIU/mL Final         Passed - Completed PHQ-2  or PHQ-9 in the last 360 days      Passed - Last Heart Rate in normal range    Pulse Readings from Last 1 Encounters:  04/30/23 (!) 53         Passed - Valid encounter within last 6 months    Recent Outpatient Visits           1 week ago [facility] Evelene Croon, Atilano Median, MD   2 weeks ago Urinary frequency   [facility] Jack[facility]yn Confer, MD   2 months ago Mixed hyperlipidemia   [place] [facility] Mecum, Oswaldo Conroy, PA-C   3 months ago Hyponatremia   [place] [facility] [place], Sherran Needs, NP   4 months ago Oral candida   [place] [facility] Larae Grooms, NP       Future Appointments             In 1 week Altamese Bras[place], MD [name] Endocrinology   In 1 week Lynn Ito, MD [facility] Disease Center   In 2 weeks Luciano Cutter, MD [facility] at [facility], [place]   In 5 months Evelene Croon, Atilano Median, MD [place]            Passed - CBC within normal limits and completed in the last 12 months    WBC  Date Value Ref Range Status  04/14/2023 4.9 3.4 - 10.8 x10E3/uL Final  01/20/2023 7.9 4.0 - 10.5 K/uL Final   RBC  Date Value Ref Range Status  04/14/2023 3.96 3.77 - 5.28 x10E6/uL Final  01/20/2023 3.85 (L) 3.87 - 5.11 MIL/uL Final   Hemoglobin  Date Value Ref Range Status  04/14/2023 13.3 11.1 - 15.9 g/dL Final   Hematocrit  Date Value Ref Range Status  04/14/2023 38.0 34.0 - 46.6 % Final   MCHC  Date Value Ref Range Status  04/14/2023 35.0 31.5 - 35.7 g/dL Final  38/75/6433 29.5 30.0 - 36.0 g/dL Final   [facility]  Date Value Ref Range Status  04/14/2023 33.6 (H) 26.6 - 33.0 pg Final  01/20/2023 32.5 26.0 - 34.0 pg Final   MCV  Date Value Ref Range Status  04/14/2023 96 79 - 97 fL Final   No results found for: "PLTCOUNTKUC", "LABPLAT", "POCPLA" RDW  Date Value Ref Range Status   04/14/2023 11.8 11.7 - 15.4 % Final         Refused Prescriptions Disp Refills   meloxicam (MOBIC) 15 MG tablet [Pharmacy Med Name: MELOXICAM 15 MG TABLET] 90 tablet     Sig: TAKE 1 TABLET BY MOUTH EVERY DAY AS NEEDED FOR PAIN     Analgesics:  COX2 Inhibitors Failed - 05/10/2023  7:35 AM      Failed - Manual Review: Labs are only required if the patient has taken medication for more than 8 weeks.      Passed - HGB in normal range and within 360 days    Hemoglobin  Date Value Ref Range Status  04/14/2023 13.3 11.1 - 15.9 g/dL Final         Passed - Cr in normal range and within 360 days    Creatinine, Ser  Date Value Ref Range Status  04/30/2023 0.70 0.57 - 1.00 mg/dL Final         Passed - HCT in normal range and within 360 days    Hematocrit  Date Value Ref Range Status  04/14/2023 38.0 34.0 - 46.6 % Final         Passed - AST in normal range and within 360 days    AST  Date Value Ref Range Status  04/30/2023 19 0 - 40 IU/L Final         Passed - ALT in normal range and within 360 days    ALT  Date Value Ref Range Status  04/30/2023 17 0 - 32 IU/L Final         Passed - eGFR is 30 or above and within 360 days    GFR calc Af Amer  Date Value Ref Range Status  09/27/2020 101 >59 mL/min/1.73 Final    Comment:    **In accordance with recommendations from the NKF-ASN Task force,**   [facility] is in the process of updating its eGFR calculation to the   2021 CKD-EPI creatinine equation that estimates kidney function   without a race variable.    GFR, Estimated  Date Value Ref Range Status  01/20/2023 >60 >60 mL/min Final    Comment:    (NOTE) Calculated using the CKD-EPI Creatinine Equation (2021)    eGFR  Date Value Ref Range Status  04/30/2023 98 >59 mL/min/1.73 Final         Passed - Patient is not pregnant      Passed - Valid encounter within last 12 months    Recent Outpatient Visits           1 week ago [facility] Jackolyn Confer, MD   2 weeks ago Urinary frequency   [place] [facility] Jackolyn Confer, MD   2 months ago Mixed hyperlipidemia   [place] [facility] Mecum, Oswaldo Conroy, PA-C   3 months ago Hyponatremia   [place] [facility] [place], Sherran Needs, NP   4 months ago Oral candida    [facility] Larae Grooms, NP       Future Appointments             In 1 week Altamese [employer], MD [facility] Endocrinology   In 1 week Lynn Ito, MD [facility] Infectious Disease Center   In 2 weeks  Luciano Cutter, MD [place] [facility] at [facility], [place]   In 5 months Evelene Croon, Atilano Median, MD [place] [facility]             DULoxetine (CYMBALTA) 30 MG capsule [Pharmacy Med Name: DULOXETINE HCL DR 30 MG CAP] 90 capsule     Sig: TAKE 1 CAPSULE BY MOUTH EVERY DAY     Psychiatry: Antidepressants - SNRI - duloxetine Failed - 05/10/2023  7:35 AM      Failed - Last BP in normal range    BP Readings from Last 1 Encounters:  04/30/23 (!) 177/93         Passed - Cr in normal range and within 360 days    Creatinine, Ser  Date Value Ref Range Status  04/30/2023 0.70 0.57 - 1.00 mg/dL Final         Passed - eGFR is 30 or above and within 360 days    GFR calc Af Amer  Date Value Ref Range Status  09/27/2020 101 >59 mL/min/1.73 Final    Comment:    **In accordance with recommendations from the NKF-ASN Task force,**   [facility] is in the process of updating its eGFR calculation to the   2021 CKD-EPI creatinine equation that estimates kidney function   without a race variable.    GFR, Estimated  Date Value Ref Range Status  01/20/2023 >60 >60 mL/min Final    Comment:    (NOTE) Calculated using the CKD-EPI Creatinine Equation (2021)    eGFR  Date Value Ref Range Status  04/30/2023 98 >59 mL/min/1.73 Final         Passed - Completed PHQ-2 or PHQ-9 in the last 360  days      Passed - Valid encounter within last 6 months    Recent Outpatient Visits           1 week ago [facility] [place] [facility] Jackolyn Confer, MD   2 weeks ago Urinary frequency   [place] [facility] Jackolyn Confer, MD   2 months ago Mixed hyperlipidemia   [place] [facility] Mecum, Oswaldo Conroy, PA-C   3 months ago Hyponatremia   [place] [facility] [place], Sherran Needs, NP   4 months ago Oral candida    [facility] Larae Grooms, NP       Future Appointments             In 1 week Altamese [place], MD [facility] Endocrinology   In 1 week Lynn Ito, MD [facility] Infectious Disease Center   In 2 weeks Luciano Cutter, MD [facility] at [facility], [place]   In 5 months Evelene Croon, Atilano Median, MD [facility], [facility]

## 2023-05-12 NOTE — Telephone Encounter (Signed)
Requested Prescriptions  Pending Prescriptions Disp Refills   QUEtiapine (SEROQUEL) 25 MG tablet [Pharmacy Med Name: QUETIAPINE FUMARATE 25 MG TAB] 30 tablet     Sig: TAKE 1 TABLET BY MOUTH EVERYDAY AT BEDTIME     Not Delegated - Psychiatry:  Antipsychotics - Second Generation (Atypical) - quetiapine Failed - 05/10/2023  7:35 AM      Failed - This refill cannot be delegated      Failed - Last BP in normal range    BP Readings from Last 1 Encounters:  04/30/23 (!) 177/93         Failed - Lipid Panel in normal range within the last 12 months    Cholesterol, Total  Date Value Ref Range Status  04/14/2023 198 100 - 199 mg/dL Final   LDL Chol Calc (NIH)  Date Value Ref Range Status  04/14/2023 87 0 - 99 mg/dL Final   HDL  Date Value Ref Range Status  04/14/2023 92 >39 mg/dL Final   Triglycerides  Date Value Ref Range Status  04/14/2023 113 0 - 149 mg/dL Final         Failed - CMP within normal limits and completed in the last 12 months    Albumin  Date Value Ref Range Status  04/30/2023 4.9 3.9 - 4.9 g/dL Final   Alkaline Phosphatase  Date Value Ref Range Status  04/30/2023 68 44 - 121 IU/L Final   ALT  Date Value Ref Range Status  04/30/2023 17 0 - 32 IU/L Final   AST  Date Value Ref Range Status  04/30/2023 19 0 - 40 IU/L Final   BUN  Date Value Ref Range Status  04/30/2023 6 (L) 8 - 27 mg/dL Final   Calcium  Date Value Ref Range Status  04/30/2023 9.5 8.7 - 10.3 mg/dL Final   CO2  Date Value Ref Range Status  04/30/2023 20 20 - 29 mmol/L Final   Creatinine, Ser  Date Value Ref Range Status  04/30/2023 0.70 0.57 - 1.00 mg/dL Final   Glucose  Date Value Ref Range Status  04/30/2023 76 70 - 99 mg/dL Final   Glucose, Bld  Date Value Ref Range Status  01/20/2023 124 (H) 70 - 99 mg/dL Final    Comment:    Glucose reference range applies only to samples taken after fasting for at least 8 hours.   Glucose-Capillary  Date Value Ref Range Status   01/20/2023 134 (H) 70 - 99 mg/dL Final    Comment:    Glucose reference range applies only to samples taken after fasting for at least 8 hours.   Potassium  Date Value Ref Range Status  04/30/2023 4.6 3.5 - 5.2 mmol/L Final   Sodium  Date Value Ref Range Status  04/30/2023 123 (L) 134 - 144 mmol/L Final   Bilirubin Total  Date Value Ref Range Status  04/30/2023 0.4 0.0 - 1.2 mg/dL Final   Bilirubin, Direct  Date Value Ref Range Status  11/05/2021 <0.1 0.0 - 0.2 mg/dL Final   Indirect Bilirubin  Date Value Ref Range Status  11/05/2021 NOT CALCULATED 0.3 - 0.9 mg/dL Final    Comment:    Performed at [facility], [address] [place], [place] [address]   Protein, ur  Date Value Ref Range Status  11/30/2022 NEGATIVE NEGATIVE mg/dL Final   Protein,UA  Date Value Ref Range Status  04/24/2023 Negative Negative/Trace Final   Total Protein  Date Value Ref Range Status  04/30/2023 7.0 6.0 -  8.5 g/dL Final   GFR calc Af Amer  Date Value Ref Range Status  09/27/2020 101 >59 mL/min/1.73 Final    Comment:    **In accordance with recommendations from the NKF-ASN Task force,**   [facility] is in the process of updating its eGFR calculation to the   2021 CKD-EPI creatinine equation that estimates kidney function   without a race variable.    eGFR  Date Value Ref Range Status  04/30/2023 98 >59 mL/min/1.73 Final   GFR, Estimated  Date Value Ref Range Status  01/20/2023 >60 >60 mL/min Final    Comment:    (NOTE) Calculated using the CKD-EPI Creatinine Equation (2021)          Passed - TSH in normal range and within 360 days    TSH  Date Value Ref Range Status  04/24/2023 0.849 0.450 - 4.500 uIU/mL Final         Passed - Completed PHQ-2 or PHQ-9 in the last 360 days      Passed - Last Heart Rate in normal range    Pulse Readings from Last 1 Encounters:  04/30/23 (!) 53         Passed - Valid encounter within last 6 months    Recent Outpatient  Visits           1 week ago [place]   [place] [facility] [place], Atilano Median, MD   2 weeks ago Urinary frequency   [place] [facility] Jackolyn Confer, MD   2 months ago Mixed hyperlipidemia   [place] [facility] Mecum, Oswaldo Conroy, PA-C   3 months ago Hyponatremia   [place] [facility] [place], Sherran Needs, NP   4 months ago Oral candida   [place] [facility] Larae Grooms, NP       Future Appointments             In 1 week Altamese [place], MD [facility] Endocrinology   In 1 week Lynn Ito, MD [facility]ious Disease Center   In 2 weeks Luciano Cutter, MD [facility] at [facility], [place]   In 5 months Evelene Croon, Atilano Median, MD [place] [facility]            Passed - CBC within normal limits and completed in the last 12 months    WBC  Date Value Ref Range Status  04/14/2023 4.9 3.4 - 10.8 x10E3/uL Final  01/20/2023 7.9 4.0 - 10.5 K/uL Final   RBC  Date Value Ref Range Status  04/14/2023 3.96 3.77 - 5.28 x10E6/uL Final  01/20/2023 3.85 (L) 3.87 - 5.11 MIL/uL Final   Hemoglobin  Date Value Ref Range Status  04/14/2023 13.3 11.1 - 15.9 g/dL Final   Hematocrit  Date Value Ref Range Status  04/14/2023 38.0 34.0 - 46.6 % Final   MCHC  Date Value Ref Range Status  04/14/2023 35.0 31.5 - 35.7 g/dL Final  18/84/1660 63.0 30.0 - 36.0 g/dL Final   [facility]  Date Value Ref Range Status  04/14/2023 33.6 (H) 26.6 - 33.0 pg Final  01/20/2023 32.5 26.0 - 34.0 pg Final   MCV  Date Value Ref Range Status  04/14/2023 96 79 - 97 fL Final   No results found for: "PLTCOUNTKUC", "LABPLAT", "POCPLA" RDW  Date Value Ref Range Status  04/14/2023 11.8 11.7 - 15.4 % Final          meloxicam (MOBIC)  15 MG tablet [Pharmacy Med Name: MELOXICAM 15 MG TABLET] 90 tablet     Sig: TAKE 1 TABLET BY MOUTH EVERY DAY AS NEEDED FOR PAIN     Analgesics:   COX2 Inhibitors Failed - 05/10/2023  7:35 AM      Failed - Manual Review: Labs are only required if the patient has taken medication for more than 8 weeks.      Passed - HGB in normal range and within 360 days    Hemoglobin  Date Value Ref Range Status  04/14/2023 13.3 11.1 - 15.9 g/dL Final         Passed - Cr in normal range and within 360 days    Creatinine, Ser  Date Value Ref Range Status  04/30/2023 0.70 0.57 - 1.00 mg/dL Final         Passed - HCT in normal range and within 360 days    Hematocrit  Date Value Ref Range Status  04/14/2023 38.0 34.0 - 46.6 % Final         Passed - AST in normal range and within 360 days    AST  Date Value Ref Range Status  04/30/2023 19 0 - 40 IU/L Final         Passed - ALT in normal range and within 360 days    ALT  Date Value Ref Range Status  04/30/2023 17 0 - 32 IU/L Final         Passed - eGFR is 30 or above and within 360 days    GFR calc Af Amer  Date Value Ref Range Status  09/27/2020 101 >59 mL/min/1.73 Final    Comment:    **In accordance with recommendations from the NKF-ASN Task force,**   [facility] is in the process of updating its eGFR calculation to the   2021 CKD-EPI creatinine equation that estimates kidney function   without a race variable.    GFR, Estimated  Date Value Ref Range Status  01/20/2023 >60 >60 mL/min Final    Comment:    (NOTE) Calculated using the CKD-EPI Creatinine Equation (2021)    eGFR  Date Value Ref Range Status  04/30/2023 98 >59 mL/min/1.73 Final         Passed - Patient is not pregnant      Passed - Valid encounter within last 12 months    Recent Outpatient Visits           1 week ago [place] Jackolyn Confer, MD   2 weeks ago Urinary frequency   [facility] Jackolyn Confer, MD   2 months ago Mixed hyperlipidemia   [place] [facility] Mecum, Oswaldo Conroy, PA-C   3 months ago Hyponatremia   [facility] [place], Sherran Needs, NP   4 months ago Oral candida   [facility] Larae Grooms, NP       Future Appointments             In 1 week Altamese [place], MD [facility] Endocrinology   In 1 week Lynn Ito, MD [facility] Disease Center   In 2 weeks Luciano Cutter, MD [facility] at [facility], [place]   In 5 months Evelene Croon, Atilano Median, MD [place] [facility]             DULoxetine (CYMBALTA) 30 MG capsule [facility] Med Name: DULOXETINE  HCL DR 30 MG CAP] 90 capsule     Sig: TAKE 1 CAPSULE BY MOUTH EVERY DAY     Psychiatry: Antidepressants - SNRI - duloxetine Failed - 05/10/2023  7:35 AM      Failed - Last BP in normal range    BP Readings from Last 1 Encounters:  04/30/23 (!) 177/93         Passed - Cr in normal range and within 360 days    Creatinine, Ser  Date Value Ref Range Status  04/30/2023 0.70 0.57 - 1.00 mg/dL Final         Passed - eGFR is 30 or above and within 360 days    GFR calc Af Amer  Date Value Ref Range Status  09/27/2020 101 >59 mL/min/1.73 Final    Comment:    **In accordance with recommendations from the NKF-ASN Task force,**   [facility] is in the process of updating its eGFR calculation to the   2021 CKD-EPI creatinine equation that estimates kidney function   without a race variable.    GFR, Estimated  Date Value Ref Range Status  01/20/2023 >60 >60 mL/min Final    Comment:    (NOTE) Calculated using the CKD-EPI Creatinine Equation (2021)    eGFR  Date Value Ref Range Status  04/30/2023 98 >59 mL/min/1.73 Final         Passed - Completed PHQ-2 or PHQ-9 in the last 360 days      Passed - Valid encounter within last 6 months    Recent Outpatient Visits           1 week ago [facility] Jackolyn Confer, MD   2 weeks ago Urinary frequency   [facility]  Jackolyn Confer, MD   2 months ago Mixed hyperlipidemia   [place] [facility] Mecum, Oswaldo Conroy, PA-C   3 months ago Hyponatremia   [place] [place], Sherran Needs, NP   4 months ago Oral candida    [facility] Larae Grooms, NP       Future Appointments             In 1 week Altamese [place], MD [facility] End[facility]   In 1 week Lynn Ito, MD [facility] Disease [facility]   In 2 weeks Luciano Cutter, MD [facility] at [facility], [place]   In 5 months Evelene Croon, Atilano Median, MD [facility]

## 2023-05-12 NOTE — Telephone Encounter (Signed)
Last seen on 08/20/22 per note " Decrease Cymbalta to 40 mg daily " Follow up scheduled on 07/10/23

## 2023-05-14 ENCOUNTER — Other Ambulatory Visit (INDEPENDENT_AMBULATORY_CARE_PROVIDER_SITE_OTHER): Payer: Medicaid Other

## 2023-05-14 DIAGNOSIS — E039 Hypothyroidism, unspecified: Secondary | ICD-10-CM

## 2023-05-14 LAB — T4, FREE: Free T4: 0.7 ng/dL (ref 0.60–1.60)

## 2023-05-14 LAB — OSMOLALITY, URINE: Osmolality, Ur: 106 mosm/kg

## 2023-05-14 LAB — COMPREHENSIVE METABOLIC PANEL
ALT: 26 IU/L (ref 0–32)
AST: 22 IU/L (ref 0–40)
Albumin: 4.9 g/dL (ref 3.9–4.9)
Alkaline Phosphatase: 80 IU/L (ref 44–121)
BUN/Creatinine Ratio: 7 — ABNORMAL LOW (ref 12–28)
BUN: 5 mg/dL — ABNORMAL LOW (ref 8–27)
Bilirubin Total: 0.4 mg/dL (ref 0.0–1.2)
CO2: 20 mmol/L (ref 20–29)
Calcium: 9.6 mg/dL (ref 8.7–10.3)
Chloride: 92 mmol/L — ABNORMAL LOW (ref 96–106)
Creatinine, Ser: 0.71 mg/dL (ref 0.57–1.00)
Globulin, Total: 2.7 g/dL (ref 1.5–4.5)
Glucose: 78 mg/dL (ref 70–99)
Potassium: 4.8 mmol/L (ref 3.5–5.2)
Sodium: 129 mmol/L — ABNORMAL LOW (ref 134–144)
Total Protein: 7.6 g/dL (ref 6.0–8.5)
eGFR: 96 mL/min/{1.73_m2} (ref >59)

## 2023-05-14 LAB — CREATININE, URINE, RANDOM: Creatinine, Urine: 11.1 mg/dL

## 2023-05-14 LAB — T3, FREE: T3, Free: 2.8 pg/mL (ref 2.3–4.2)

## 2023-05-14 LAB — SODIUM, URINE, RANDOM: Sodium, Ur: 29 mmol/L

## 2023-05-14 LAB — TSH: TSH: 1.47 u[IU]/mL (ref 0.35–5.50)

## 2023-05-14 LAB — OSMOLALITY: Osmolality Meas: 265 mosm/kg — ABNORMAL LOW (ref 280–301)

## 2023-05-15 ENCOUNTER — Ambulatory Visit
Admission: RE | Admit: 2023-05-15 | Discharge: 2023-05-15 | Disposition: A | Discharge status: Home / Self Care | Payer: Medicaid Other | Source: Ambulatory Visit | Attending: Pediatrics | Admitting: Pediatrics

## 2023-05-15 DIAGNOSIS — Z122 Encounter for screening for malignant neoplasm of respiratory organs: Secondary | ICD-10-CM | POA: Insufficient documentation

## 2023-05-15 DIAGNOSIS — Z87891 Personal history of nicotine dependence: Secondary | ICD-10-CM | POA: Diagnosis present

## 2023-05-16 ENCOUNTER — Other Ambulatory Visit: Payer: Self-pay | Admitting: Pediatrics

## 2023-05-16 DIAGNOSIS — R350 Frequency of micturition: Secondary | ICD-10-CM

## 2023-05-19 ENCOUNTER — Encounter: Payer: Medicaid Other | Admitting: Physical Therapy

## 2023-05-19 LAB — THYROID STIMULATING IMMUNOGLOBULIN: TSI: <89 % baseline (ref <140)

## 2023-05-19 LAB — TRAB (TSH RECEPTOR BINDING ANTIBODY): TRAB: 3.36 [IU]/L — ABNORMAL HIGH (ref <=2.00)

## 2023-05-21 ENCOUNTER — Encounter: Payer: Self-pay | Admitting: "Endocrinology

## 2023-05-21 ENCOUNTER — Ambulatory Visit: Payer: Medicaid Other | Admitting: "Endocrinology

## 2023-05-21 ENCOUNTER — Telehealth: Payer: Self-pay | Admitting: Pediatrics

## 2023-05-21 ENCOUNTER — Other Ambulatory Visit: Payer: Medicaid Other

## 2023-05-21 VITALS — BP 140/80 | HR 67 | Ht 68.0 in | Wt 121.4 lb

## 2023-05-21 DIAGNOSIS — R768 Other specified abnormal immunological findings in serum: Secondary | ICD-10-CM | POA: Diagnosis not present

## 2023-05-21 DIAGNOSIS — R350 Frequency of micturition: Secondary | ICD-10-CM

## 2023-05-21 LAB — URINALYSIS, ROUTINE W REFLEX MICROSCOPIC
Bilirubin, UA: NEGATIVE
Glucose, UA: NEGATIVE
Ketones, UA: NEGATIVE
Leukocytes,UA: NEGATIVE
Nitrite, UA: NEGATIVE
Protein,UA: NEGATIVE
RBC, UA: NEGATIVE
Specific Gravity, UA: 1.015 (ref 1.005–1.030)
Urobilinogen, Ur: 0.2 mg/dL (ref 0.2–1.0)
pH, UA: 7 (ref 5.0–7.5)
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
## 2023-05-22 ENCOUNTER — Encounter: Payer: Self-pay | Admitting: Infectious Diseases

## 2023-05-22 ENCOUNTER — Other Ambulatory Visit
Admission: RE | Admit: 2023-05-22 | Discharge: 2023-05-22 | Disposition: A | Discharge status: Home / Self Care | Payer: Medicaid Other | Source: Ambulatory Visit | Attending: Infectious Diseases | Admitting: Infectious Diseases

## 2023-05-22 ENCOUNTER — Ambulatory Visit: Payer: Medicaid Other | Attending: Infectious Diseases | Admitting: Infectious Diseases

## 2023-05-22 VITALS — BP 151/86 | HR 64 | Temp 97.0°F | Ht 68.0 in | Wt 122.0 lb

## 2023-05-22 DIAGNOSIS — R5383 Other fatigue: Secondary | ICD-10-CM | POA: Insufficient documentation

## 2023-05-22 DIAGNOSIS — M47896 Other spondylosis, lumbar region: Secondary | ICD-10-CM | POA: Diagnosis not present

## 2023-05-22 DIAGNOSIS — B379 Candidiasis, unspecified: Secondary | ICD-10-CM

## 2023-05-22 DIAGNOSIS — I10 Essential (primary) hypertension: Secondary | ICD-10-CM | POA: Diagnosis not present

## 2023-05-22 DIAGNOSIS — M47892 Other spondylosis, cervical region: Secondary | ICD-10-CM | POA: Diagnosis not present

## 2023-05-22 DIAGNOSIS — Z972 Presence of dental prosthetic device (complete) (partial): Secondary | ICD-10-CM | POA: Insufficient documentation

## 2023-05-22 DIAGNOSIS — F419 Anxiety disorder, unspecified: Secondary | ICD-10-CM | POA: Insufficient documentation

## 2023-05-22 DIAGNOSIS — E871 Hypo-osmolality and hyponatremia: Secondary | ICD-10-CM | POA: Insufficient documentation

## 2023-05-22 DIAGNOSIS — M797 Fibromyalgia: Secondary | ICD-10-CM | POA: Insufficient documentation

## 2023-05-22 DIAGNOSIS — Z79899 Other long term (current) drug therapy: Secondary | ICD-10-CM | POA: Diagnosis not present

## 2023-05-22 DIAGNOSIS — R590 Localized enlarged lymph nodes: Secondary | ICD-10-CM | POA: Insufficient documentation

## 2023-05-22 DIAGNOSIS — Z87891 Personal history of nicotine dependence: Secondary | ICD-10-CM | POA: Diagnosis not present

## 2023-05-22 DIAGNOSIS — R638 Other symptoms and signs concerning food and fluid intake: Secondary | ICD-10-CM | POA: Diagnosis not present

## 2023-05-22 DIAGNOSIS — Z681 Body mass index (BMI) 19 or less, adult: Secondary | ICD-10-CM | POA: Diagnosis not present

## 2023-05-22 NOTE — Patient Instructions (Signed)
You are here for possible candida . I dont see any today- but will check a blood test.
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
## 2023-05-23 LAB — URINE CULTURE

## 2023-05-26 ENCOUNTER — Encounter: Payer: Self-pay | Admitting: Physical Therapy

## 2023-05-26 ENCOUNTER — Ambulatory Visit: Payer: Medicaid Other | Attending: Orthopedic Surgery | Admitting: Physical Therapy

## 2023-05-26 DIAGNOSIS — M6281 Muscle weakness (generalized): Secondary | ICD-10-CM | POA: Insufficient documentation

## 2023-05-26 DIAGNOSIS — M797 Fibromyalgia: Secondary | ICD-10-CM | POA: Diagnosis present

## 2023-05-26 DIAGNOSIS — M25511 Pain in right shoulder: Secondary | ICD-10-CM | POA: Insufficient documentation

## 2023-05-26 DIAGNOSIS — M542 Cervicalgia: Secondary | ICD-10-CM | POA: Diagnosis present

## 2023-05-26 DIAGNOSIS — G8929 Other chronic pain: Secondary | ICD-10-CM | POA: Insufficient documentation
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
## 2023-05-26 NOTE — Telephone Encounter (Signed)
error 

## 2023-05-27 ENCOUNTER — Ambulatory Visit: Payer: Medicaid Other | Admitting: Pediatrics

## 2023-05-27 ENCOUNTER — Encounter (HOSPITAL_BASED_OUTPATIENT_CLINIC_OR_DEPARTMENT_OTHER): Payer: Self-pay | Admitting: Pulmonary Disease

## 2023-05-27 ENCOUNTER — Ambulatory Visit (HOSPITAL_BASED_OUTPATIENT_CLINIC_OR_DEPARTMENT_OTHER): Payer: Medicaid Other | Admitting: Pulmonary Disease

## 2023-05-27 ENCOUNTER — Encounter: Payer: Self-pay | Admitting: Pediatrics

## 2023-05-27 VITALS — BP 130/67 | HR 65 | Temp 97.9°F | Wt 122.0 lb

## 2023-05-27 VITALS — BP 148/82 | HR 58 | Ht 68.0 in | Wt 120.0 lb

## 2023-05-27 DIAGNOSIS — J449 Chronic obstructive pulmonary disease, unspecified: Secondary | ICD-10-CM

## 2023-05-27 DIAGNOSIS — I10 Essential (primary) hypertension: Secondary | ICD-10-CM | POA: Diagnosis not present

## 2023-05-27 DIAGNOSIS — R590 Localized enlarged lymph nodes: Secondary | ICD-10-CM

## 2023-05-27 DIAGNOSIS — M542 Cervicalgia: Secondary | ICD-10-CM | POA: Diagnosis not present

## 2023-05-27 DIAGNOSIS — E871 Hypo-osmolality and hyponatremia: Secondary | ICD-10-CM | POA: Diagnosis not present

## 2023-05-27 MED ORDER — STIOLTO RESPIMAT 2.5-2.5 MCG/ACT IN AERS: 2.0000 | INHALATION_SPRAY | Freq: Every day | RESPIRATORY_TRACT | 11 refills | Status: DC

## 2023-05-27 NOTE — Patient Instructions (Signed)
Emphysema - well controlled on LAMA/LABA. Thrush with ICS. Last exacerbation >1 year --CONTINUE Stiolto TWO puffs in the morning. REFILL --CONTINUE Albuterol as needed for shortness of breath or wheezing --CONTINUE nebulizer with meds: Take nightly or as needed up to 4 times a day --ORDER pulmonary function test for next visit  Nocturnal hypoxemia --Continue 1L nightly. Due for ONO renewal before 10/2023

## 2023-05-27 NOTE — Patient Instructions (Addendum)
Labs tomorrow before 10am  Xray of your neck - [facility] can walk in [facility] [address] [place],  [place]  [address]  I will work on nephrology scheduling I will message you about the biopsy of the lymph node
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
## 2023-05-27 NOTE — Progress Notes (Unsigned)
   Office Visit  BP 130/67   Pulse 65   Temp 97.9 F (36.6 C) (Oral)   Wt 122 lb (55.3 kg)   SpO2 99%   BMI 18.55 kg/m    Subjective:    Patient ID: Amber Livingston, female    DOB: 1961-03-08, 62 y.o.   MRN: [id number]  HPI: Amber Livingston is a 62 y.o. female  Chief Complaint  Patient presents with  . Abnormal Lab   Presenting to discuss recent consultant visits She is concerned about lymph node, would like biopsy Concerned about nephrology follow up She is frustrated there has not been an explanation to her symptoms Has had cervical pain chronically, she is concerned about lymph node being inflamed because of her cervical pain.  Concerned about weight loss over the last year. Has night sweats. Weight trend below: Wt Readings from Last 3 Encounters:  05/27/23 122 lb (55.3 kg)  05/27/23 120 lb (54.4 kg)  05/22/23 122 lb (55.3 kg)   Relevant past medical, surgical, family and social history reviewed and updated as indicated. Interim medical history since our last visit reviewed. Allergies and medications reviewed and updated.  ROS per HPI unless specifically indicated above     Objective:    BP 130/67   Pulse 65   Temp 97.9 F (36.6 C) (Oral)   Wt 122 lb (55.3 kg)   SpO2 99%   BMI 18.55 kg/m   Wt Readings from Last 3 Encounters:  05/27/23 122 lb (55.3 kg)  05/27/23 120 lb (54.4 kg)  05/22/23 122 lb (55.3 kg)     Physical Exam Constitutional:      Appearance: Normal appearance.  HENT:     Head: Normocephalic and atraumatic.  Eyes:     Pupils: Pupils are equal, round, and reactive to light.  Neck:      Comments: Lymphadenopathy noted above Cardiovascular:     Rate and Rhythm: Normal rate and regular rhythm.     Pulses: Normal pulses.     Heart sounds: Normal heart sounds.  Pulmonary:     Effort: Pulmonary effort is normal.     Breath sounds: Normal breath sounds.  Abdominal:     General: Abdomen is flat.     Palpations: Abdomen is soft.  Musculoskeletal:         General: Normal range of motion.     Cervical back: Normal range of motion.  Lymphadenopathy:     Cervical: Cervical adenopathy present.     Left cervical: Superficial cervical adenopathy present.  Skin:    General: Skin is warm and dry.     Capillary Refill: Capillary refill takes less than 2 seconds.  Neurological:     General: No focal deficit present.     Mental Status: She is alert. Mental status is at baseline.  Psychiatric:        Mood and Affect: Mood normal.        Behavior: Behavior normal.       Assessment & Plan:  Assessment & Plan   Primary hypertension -     Aldosterone + renin activity w/ ratio; Future  Cervical pain (neck) -     DG Cervical Spine Complete; Future  Cervical lymphadenopathy  Hyponatremia -     Cortisol; Future      Follow up plan: No follow-ups on file.  Amber Turbin Howell Pringle, MD

## 2023-05-28 ENCOUNTER — Encounter: Payer: Self-pay | Admitting: Pediatrics

## 2023-05-28 ENCOUNTER — Ambulatory Visit: Payer: Medicaid Other | Admitting: Physical Therapy

## 2023-05-28 LAB — FUNGITELL BETA-D-GLUCAN
Fungitell Value:: <31.25 pg/mL
Result Name:: NEGATIVE

## 2023-05-28 NOTE — Assessment & Plan Note (Signed)
Improved today. Asymptoamtic. Endo and ID recommend to r/o adrenal insufficiency. Placed labs below. Needs to come in before 10am. Will also check for hyperaldo.

## 2023-05-29 ENCOUNTER — Ambulatory Visit
Admission: RE | Admit: 2023-05-29 | Discharge: 2023-05-29 | Disposition: A | Discharge status: Home / Self Care | Payer: Medicaid Other | Source: Ambulatory Visit | Attending: Pediatrics | Admitting: Pediatrics

## 2023-05-29 ENCOUNTER — Ambulatory Visit
Admission: RE | Admit: 2023-05-29 | Discharge: 2023-05-29 | Disposition: A | Discharge status: Home / Self Care | Payer: Medicaid Other | Attending: Pediatrics | Admitting: Pediatrics

## 2023-05-29 ENCOUNTER — Other Ambulatory Visit: Payer: Medicaid Other

## 2023-05-29 DIAGNOSIS — E871 Hypo-osmolality and hyponatremia: Secondary | ICD-10-CM

## 2023-05-29 DIAGNOSIS — M542 Cervicalgia: Secondary | ICD-10-CM

## 2023-05-29 DIAGNOSIS — I10 Essential (primary) hypertension: Secondary | ICD-10-CM

## 2023-05-30 ENCOUNTER — Other Ambulatory Visit: Payer: Self-pay | Admitting: Acute Care

## 2023-05-30 DIAGNOSIS — Z122 Encounter for screening for malignant neoplasm of respiratory organs: Secondary | ICD-10-CM

## 2023-05-30 DIAGNOSIS — F17210 Nicotine dependence, cigarettes, uncomplicated: Secondary | ICD-10-CM

## 2023-06-02 LAB — CORTISOL: Cortisol: 8.5 ug/dL (ref 6.2–19.4)

## 2023-06-02 LAB — ALDOSTERONE + RENIN ACTIVITY W/ RATIO
Aldosterone: 6.3 ng/dL (ref 0.0–30.0)
Renin Activity, Plasma: <0.167 ng/mL/h — ABNORMAL LOW (ref 0.167–5.380)

## 2023-06-04 ENCOUNTER — Telehealth: Payer: Self-pay | Admitting: Pediatrics

## 2023-06-04 NOTE — Telephone Encounter (Signed)
Copied from CRM [id number]. Topic: General - Other >> Jun 04, 2023  9:08 AM Macon Large wrote: Reason for CRM: Pt called for imaging results. Pt stated she wanted to make provider aware that she has an appt with nephrology. Pt requests call back at [phone]

## 2023-06-04 NOTE — Telephone Encounter (Signed)
Called and notified patient that we are still waiting on results to be read by radiology. Advised patient that results are taking about 2 weeks to come back right now. Advised patient we would let her know as soon as results were available. Patient verbalized understanding.

## 2023-06-06 ENCOUNTER — Other Ambulatory Visit: Payer: Self-pay | Admitting: Pediatrics

## 2023-06-06 DIAGNOSIS — E871 Hypo-osmolality and hyponatremia: Secondary | ICD-10-CM

## 2023-06-06 DIAGNOSIS — G8929 Other chronic pain: Secondary | ICD-10-CM

## 2023-06-06 NOTE — Telephone Encounter (Signed)
Requested medication (s) are due for refill today -provider review   Requested medication (s) are on the active medication list -yes  Future visit scheduled -yes  Last refill: 04/30/23 #30  Notes to clinic: original Rx- acute visit- provider review  to continue  Requested Prescriptions  Pending Prescriptions Disp Refills   ibuprofen (ADVIL) 600 MG tablet [Pharmacy Med Name: IBUPROFEN 600 MG TABLET] 30 tablet 0    Sig: TAKE 1 TABLET BY MOUTH EVERY 8 HOURS AS NEEDED.     Analgesics:  NSAIDS Failed - 06/06/2023  9:07 AM      Failed - Manual Review: Labs are only required if the patient has taken medication for more than 8 weeks.      Passed - Cr in normal range and within 360 days    Creatinine, Ser  Date Value Ref Range Status  05/12/2023 0.71 0.57 - 1.00 mg/dL Final         Passed - HGB in normal range and within 360 days    Hemoglobin  Date Value Ref Range Status  04/14/2023 13.3 11.1 - 15.9 g/dL Final         Passed - PLT in normal range and within 360 days    Platelets  Date Value Ref Range Status  04/14/2023 328 150 - 450 x10E3/uL Final         Passed - HCT in normal range and within 360 days    Hematocrit  Date Value Ref Range Status  04/14/2023 38.0 34.0 - 46.6 % Final         Passed - eGFR is 30 or above and within 360 days    GFR calc Af Amer  Date Value Ref Range Status  09/27/2020 101 >59 mL/min/1.73 Final    Comment:    **In accordance with recommendations from the NKF-ASN Task force,**   [facility] is in the process of updating its eGFR calculation to the   2021 CKD-EPI creatinine equation that estimates kidney function   without a race variable.    GFR, Estimated  Date Value Ref Range Status  01/20/2023 >60 >60 mL/min Final    Comment:    (NOTE) Calculated using the CKD-EPI Creatinine Equation (2021)    eGFR  Date Value Ref Range Status  05/12/2023 96 >59 mL/min/1.73 Final         Passed - Patient is not pregnant      Passed - Valid  encounter within last 12 months    Recent Outpatient Visits           1 week ago Primary hypertension   [place] [facility] Jackolyn Confer, MD   1 month ago [facility] Jackolyn Confer, MD   1 month ago Urinary frequency   [place] [facility] Jackolyn Confer, MD   3 months ago Mixed hyperlipidemia   [place] [facility] Mecum, Oswaldo Conroy, PA-C   4 months ago Hyponatremia   [place] [facility] [place], Sherran Needs, NP       Future Appointments             In 4 months Evelene Croon, Atilano Median, MD [place] [facility]               Requested Prescriptions  Pending Prescriptions Disp Refills   ibuprofen (ADVIL) 600 MG tablet [Pharmacy Med Name: IBUPROFEN 600 MG TABLET] 30 tablet 0    Sig:  TAKE 1 TABLET BY MOUTH EVERY 8 HOURS AS NEEDED.     Analgesics:  NSAIDS Failed - 06/06/2023  9:07 AM      Failed - Manual Review: Labs are only required if the patient has taken medication for more than 8 weeks.      Passed - Cr in normal range and within 360 days    Creatinine, Ser  Date Value Ref Range Status  05/12/2023 0.71 0.57 - 1.00 mg/dL Final         Passed - HGB in normal range and within 360 days    Hemoglobin  Date Value Ref Range Status  04/14/2023 13.3 11.1 - 15.9 g/dL Final         Passed - PLT in normal range and within 360 days    Platelets  Date Value Ref Range Status  04/14/2023 328 150 - 450 x10E3/uL Final         Passed - HCT in normal range and within 360 days    Hematocrit  Date Value Ref Range Status  04/14/2023 38.0 34.0 - 46.6 % Final         Passed - eGFR is 30 or above and within 360 days    GFR calc Af Amer  Date Value Ref Range Status  09/27/2020 101 >59 mL/min/1.73 Final    Comment:    **In accordance with recommendations from the NKF-ASN Task force,**   [facility] is in the process of updating its eGFR calculation to the    2021 CKD-EPI creatinine equation that estimates kidney function   without a race variable.    GFR, Estimated  Date Value Ref Range Status  01/20/2023 >60 >60 mL/min Final    Comment:    (NOTE) Calculated using the CKD-EPI Creatinine Equation (2021)    eGFR  Date Value Ref Range Status  05/12/2023 96 >59 mL/min/1.73 Final         Passed - Patient is not pregnant      Passed - Valid encounter within last 12 months    Recent Outpatient Visits           1 week ago Primary hypertension   [place] [facility] Jackolyn Confer, MD   1 month ago [facility] Jackolyn Confer, MD   1 month ago Urinary frequency   [place] [facility] Jackolyn Confer, MD   3 months ago Mixed hyperlipidemia   [place] [facility] Mecum, Oswaldo Conroy, PA-C   4 months ago Hyponatremia    [facility] [place], Sherran Needs, NP       Future Appointments             In 4 months Evelene Croon, Atilano Median, MD [facility]

## 2023-06-06 NOTE — Progress Notes (Signed)
Called her and informed her of this and she is fine with it.

## 2023-06-06 NOTE — Progress Notes (Signed)
Our earliest appointment is normally 8:20 am because that is when the labs opens, but that one is already filled.  I put her on at 8:40 but she doesn't have to wait til then for a lab appointment as soon as she checks in and the labs are released we send her back.

## 2023-06-09 ENCOUNTER — Other Ambulatory Visit: Payer: Medicaid Other

## 2023-06-09 ENCOUNTER — Ambulatory Visit: Payer: Medicaid Other | Admitting: Physical Therapy

## 2023-06-09 ENCOUNTER — Ambulatory Visit: Payer: Medicaid Other | Admitting: Surgery

## 2023-06-09 DIAGNOSIS — E871 Hypo-osmolality and hyponatremia: Secondary | ICD-10-CM
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
## 2023-06-10 ENCOUNTER — Other Ambulatory Visit: Payer: Self-pay | Admitting: Pediatrics

## 2023-06-10 DIAGNOSIS — E871 Hypo-osmolality and hyponatremia: Secondary | ICD-10-CM

## 2023-06-10 LAB — CORTISOL: Cortisol: 15.6 ug/dL (ref 6.2–19.4)

## 2023-06-10 NOTE — Progress Notes (Signed)
Referral to nephrology re-placed. Initially referred after admission.

## 2023-06-11 ENCOUNTER — Other Ambulatory Visit: Payer: Self-pay | Admitting: Internal Medicine

## 2023-06-16 ENCOUNTER — Ambulatory Visit: Payer: Medicaid Other | Admitting: Physical Therapy
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
## 2023-06-18 ENCOUNTER — Encounter: Payer: Self-pay | Admitting: Pediatrics

## 2023-06-18 ENCOUNTER — Other Ambulatory Visit: Payer: Self-pay | Admitting: Pediatrics

## 2023-06-18 ENCOUNTER — Ambulatory Visit: Payer: Medicaid Other | Admitting: Pediatrics

## 2023-06-18 VITALS — BP 161/97 | HR 88 | Temp 98.1°F | Wt 124.0 lb

## 2023-06-18 DIAGNOSIS — R399 Unspecified symptoms and signs involving the genitourinary system: Secondary | ICD-10-CM | POA: Diagnosis not present

## 2023-06-18 DIAGNOSIS — Z202 Contact with and (suspected) exposure to infections with a predominantly sexual mode of transmission: Secondary | ICD-10-CM

## 2023-06-18 DIAGNOSIS — B9689 Other specified bacterial agents as the cause of diseases classified elsewhere: Secondary | ICD-10-CM

## 2023-06-18 LAB — URINALYSIS, ROUTINE W REFLEX MICROSCOPIC
Bilirubin, UA: NEGATIVE
Glucose, UA: NEGATIVE
Ketones, UA: NEGATIVE
Leukocytes,UA: NEGATIVE
Nitrite, UA: NEGATIVE
Protein,UA: NEGATIVE
RBC, UA: NEGATIVE
Specific Gravity, UA: 1.015 (ref 1.005–1.030)
Urobilinogen, Ur: 0.2 mg/dL (ref 0.2–1.0)
pH, UA: 7 (ref 5.0–7.5)

## 2023-06-18 LAB — WET PREP FOR TRICH, YEAST, CLUE
Clue Cell Exam: POSITIVE — AB
Trichomonas Exam: NEGATIVE
Yeast Exam: NEGATIVE

## 2023-06-18 MED ORDER — FLUCONAZOLE 150 MG PO TABS
150.0000 mg | ORAL_TABLET | Freq: Once | ORAL | 0 refills | Status: AC
Start: 2023-06-18 — End: 2023-06-18

## 2023-06-18 MED ORDER — METRONIDAZOLE 500 MG PO TABS
500.0000 mg | ORAL_TABLET | Freq: Two times a day (BID) | ORAL | 0 refills | Status: AC
Start: 2023-06-18 — End: 2023-06-25
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
## 2023-06-18 NOTE — Patient Instructions (Signed)
Will let you know lab results.

## 2023-06-19 ENCOUNTER — Other Ambulatory Visit: Payer: Self-pay | Admitting: Pediatrics

## 2023-06-19 ENCOUNTER — Other Ambulatory Visit: Payer: Self-pay | Admitting: Nurse Practitioner

## 2023-06-19 DIAGNOSIS — G8929 Other chronic pain: Secondary | ICD-10-CM

## 2023-06-19 LAB — HIV ANTIBODY (ROUTINE TESTING W REFLEX): HIV Screen 4th Generation wRfx: NONREACTIVE

## 2023-06-19 NOTE — Telephone Encounter (Signed)
Requested Prescriptions  Refused Prescriptions Disp Refills   ibuprofen (ADVIL) 600 MG tablet [Pharmacy Med Name: IBUPROFEN 600 MG TABLET] 30 tablet 0    Sig: TAKE 1 TABLET (600 MG TOTAL) BY MOUTH DAILY AS NEEDED     Analgesics:  NSAIDS Failed - 06/19/2023  4:59 PM      Failed - Manual Review: Labs are only required if the patient has taken medication for more than 8 weeks.      Passed - Cr in normal range and within 360 days    Creatinine, Ser  Date Value Ref Range Status  05/12/2023 0.71 0.57 - 1.00 mg/dL Final         Passed - HGB in normal range and within 360 days    Hemoglobin  Date Value Ref Range Status  04/14/2023 13.3 11.1 - 15.9 g/dL Final         Passed - PLT in normal range and within 360 days    Platelets  Date Value Ref Range Status  04/14/2023 328 150 - 450 x10E3/uL Final         Passed - HCT in normal range and within 360 days    Hematocrit  Date Value Ref Range Status  04/14/2023 38.0 34.0 - 46.6 % Final         Passed - eGFR is 30 or above and within 360 days    GFR calc Af Amer  Date Value Ref Range Status  09/27/2020 101 >59 mL/min/1.73 Final    Comment:    **In accordance with recommendations from the NKF-ASN Task force,**   [facility] is in the process of updating its eGFR calculation to the   2021 CKD-EPI creatinine equation that estimates kidney function   without a race variable.    GFR, Estimated  Date Value Ref Range Status  01/20/2023 >60 >60 mL/min Final    Comment:    (NOTE) Calculated using the CKD-EPI Creatinine Equation (2021)    eGFR  Date Value Ref Range Status  05/12/2023 96 >59 mL/min/1.73 Final         Passed - Patient is not pregnant      Passed - Valid encounter within last 12 months    Recent Outpatient Visits           Yesterday Symptoms involving urinary system   [place] [facility] Jackolyn Confer, MD   3 weeks ago Primary hypertension   [place] [facility] Jackolyn Confer, MD    1 month ago [place] [facility] Jackolyn Confer, MD   1 month ago Urinary frequency   [place] [facility] Jackolyn Confer, MD   3 months ago Mixed hyperlipidemia   [place] [facility] Mecum, Oswaldo Conroy, PA-C       Future Appointments             In 4 months Evelene Croon, Atilano Median, MD [facility]

## 2023-06-19 NOTE — Telephone Encounter (Signed)
Requested Prescriptions  Pending Prescriptions Disp Refills   pantoprazole (PROTONIX) 40 MG tablet [Pharmacy Med Name: PANTOPRAZOLE SOD DR 40 MG TAB] 90 tablet 1    Sig: TAKE 1 TABLET BY MOUTH DAILY AS NEEDED     Gastroenterology: Proton Pump Inhibitors Passed - 06/19/2023  4:59 PM      Passed - Valid encounter within last 12 months    Recent Outpatient Visits           Yesterday Symptoms involving urinary system   [place] [facility] Jackolyn Confer, MD   3 weeks ago Primary hypertension   [facility] Jackolyn Confer, MD   1 month ago [facility] Jackolyn Confer, MD   1 month ago Urinary frequency   [place] [facility] Jackolyn Confer, MD   3 months ago Mixed hyperlipidemia   [place] [facility] Mecum, Oswaldo Conroy, PA-C       Future Appointments             In 4 months Evelene Croon, Atilano Median, MD [facility]

## 2023-06-20 LAB — CHLAMYDIA/GONOCOCCUS/TRICHOMONAS, NAA
Chlamydia by NAA: NEGATIVE
Gonococcus by NAA: NEGATIVE
Trich vag by NAA: NEGATIVE

## 2023-06-20 LAB — ACUTE VIRAL HEPATITIS (HAV, HBV, HCV)
HCV Ab: NONREACTIVE
Hep A IgM: NEGATIVE
Hep B C IgM: NEGATIVE
Hepatitis B Surface Ag: NEGATIVE

## 2023-06-20 LAB — SPECIMEN STATUS REPORT

## 2023-06-20 LAB — URINE CULTURE: Organism ID, Bacteria: NO GROWTH

## 2023-06-20 LAB — HCV INTERPRETATION

## 2023-06-22 ENCOUNTER — Encounter: Payer: Self-pay | Admitting: Pediatrics

## 2023-06-23 ENCOUNTER — Ambulatory Visit: Payer: Medicaid Other | Admitting: Surgery

## 2023-06-23 ENCOUNTER — Ambulatory Visit: Payer: Medicaid Other | Attending: Orthopedic Surgery | Admitting: Physical Therapy

## 2023-06-23 ENCOUNTER — Encounter: Payer: Self-pay | Admitting: Surgery

## 2023-06-23 VITALS — BP 119/63 | HR 69 | Temp 98.4°F | Ht 68.0 in | Wt 120.6 lb

## 2023-06-23 DIAGNOSIS — G8929 Other chronic pain: Secondary | ICD-10-CM

## 2023-06-23 DIAGNOSIS — R591 Generalized enlarged lymph nodes: Secondary | ICD-10-CM

## 2023-06-23 DIAGNOSIS — M542 Cervicalgia: Secondary | ICD-10-CM | POA: Diagnosis present

## 2023-06-23 DIAGNOSIS — M797 Fibromyalgia: Secondary | ICD-10-CM | POA: Diagnosis present

## 2023-06-23 DIAGNOSIS — M25511 Pain in right shoulder: Secondary | ICD-10-CM | POA: Insufficient documentation

## 2023-06-23 DIAGNOSIS — M6281 Muscle weakness (generalized): Secondary | ICD-10-CM
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
## 2023-06-23 NOTE — Patient Instructions (Addendum)
Your CT is scheduled for 06/27/2023 3 pm (arrive by 2:45 pm) at [facility] [address].   If you have any concerns or questions, please feel free to call our office.   Lymphadenopathy  Lymphadenopathy is when your lymph glands are swollen or larger than normal.  Lymph glands, also called lymph nodes, are clumps of tissue. They filter germs and waste from tissues in your body to your bloodstream. They're part of your body's defense system, or immune system. Lymphadenopathy has different causes, like infection, autoimmune disease, and cancer. Lymphadenopathy can happen wherever you have lymph nodes. The type you have depends on which nodes it's in, such as: Cervical lymphadenopathy. This is in the neck. Mediastinal lymphadenopathy. This is in the chest. Hilar lymphadenopathy. This is in the lungs. Axillary lymphadenopathy. This is in the armpits. Inguinal lymphadenopathy. This is in the groin. Sometimes, fluid and cells that fight infection build up in your lymph nodes. This happens when your immune system reacts to germs or other substances that get into your body. This makes lymph nodes swell and get bigger. Treatment is based on what's thought to be the cause. Sometimes, lymph nodes don't go back to normal size after treatment. If yours don't, your health care provider may order tests to help learn why your glands are still swollen and big. Follow these instructions at home:  Take over-the-counter and prescription medicines only as told by your provider. If you were prescribed antibiotics, do not stop using them, even if you start to feel better. If told, apply heat to swollen lymph nodes as told by your provider. Use the heat source that your provider recommends, such as a moist heat pack or a heating pad. Place a towel between your skin and the heat source. Leave the heat on only for the time told by your provider to avoid injury. If your skin turns bright red, remove the heat  right away to prevent burns. The risk of burns is higher if you cannot feel pain, heat, or cold. Check your swollen lymph nodes every day for changes. Check other places where you have lymph nodes as told. Check for changes such as: More swelling. Sudden growth in size. Redness or pain. Hardness. Contact a health care provider if: You have lymph nodes that: Are still swollen after 2 weeks. Have gotten bigger all of a sudden or the swelling spreads. Are red, painful, or hard. Fluid leaks from the skin near a swollen lymph node. You get a fever, chills, or night sweats. You feel tired. You have a sore throat. Your abdomen hurts. You lose weight without trying. This information is not intended to replace advice given to you by your health care provider. Make sure you discuss any questions you have with your health care provider. Document Revised: 10/30/2022 Document Reviewed: 10/30/2022 Elsevier Patient Education  2024 [employer].

## 2023-06-25 ENCOUNTER — Encounter: Payer: Self-pay | Admitting: Surgery
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
## 2023-06-26 ENCOUNTER — Telehealth: Payer: Self-pay | Admitting: Pediatrics

## 2023-06-26 NOTE — Telephone Encounter (Signed)
Patient has called and wanted to make sure that Dr Evelene Croon put in an order for her cervical physical therapy and patient states she saw the kidney doctor, but Dr Evelene Croon was also supposed to put in an order for patient to have a test that goes into the bladder with a camera. Patient states she has not heard anything back about either of these and it has been two weeks? Please advise and follow back up with the patient @ # [phone]) [phone].

## 2023-06-27 ENCOUNTER — Other Ambulatory Visit: Payer: Self-pay | Admitting: Pediatrics

## 2023-06-27 ENCOUNTER — Ambulatory Visit
Admission: RE | Admit: 2023-06-27 | Discharge: 2023-06-27 | Disposition: A | Discharge status: Home / Self Care | Payer: Medicaid Other | Source: Ambulatory Visit | Attending: Surgery | Admitting: Surgery

## 2023-06-27 DIAGNOSIS — R591 Generalized enlarged lymph nodes: Secondary | ICD-10-CM | POA: Insufficient documentation

## 2023-06-27 DIAGNOSIS — M5030 Other cervical disc degeneration, unspecified cervical region: Secondary | ICD-10-CM

## 2023-06-27 MED ORDER — IOHEXOL 300 MG/ML  SOLN
75.0000 mL | Freq: Once | INTRAMUSCULAR | Status: AC | PRN
  Administered 2023-06-27: 75 mL via INTRAVENOUS

## 2023-07-01 ENCOUNTER — Ambulatory Visit: Payer: Medicaid Other | Admitting: Physical Therapy

## 2023-07-01 ENCOUNTER — Encounter: Payer: Self-pay | Admitting: Physical Therapy

## 2023-07-01 DIAGNOSIS — M542 Cervicalgia: Secondary | ICD-10-CM

## 2023-07-01 DIAGNOSIS — M6281 Muscle weakness (generalized): Secondary | ICD-10-CM

## 2023-07-01 DIAGNOSIS — M797 Fibromyalgia: Secondary | ICD-10-CM

## 2023-07-01 DIAGNOSIS — G8929 Other chronic pain: Secondary | ICD-10-CM

## 2023-07-01 DIAGNOSIS — M25511 Pain in right shoulder: Secondary | ICD-10-CM | POA: Diagnosis not present
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
## 2023-07-07 ENCOUNTER — Ambulatory Visit: Payer: Medicaid Other | Admitting: Physical Therapy

## 2023-07-07 ENCOUNTER — Encounter: Payer: Self-pay | Admitting: Physical Therapy

## 2023-07-07 DIAGNOSIS — M542 Cervicalgia: Secondary | ICD-10-CM

## 2023-07-07 DIAGNOSIS — G8929 Other chronic pain: Secondary | ICD-10-CM

## 2023-07-07 DIAGNOSIS — M25511 Pain in right shoulder: Secondary | ICD-10-CM | POA: Diagnosis not present

## 2023-07-07 DIAGNOSIS — M797 Fibromyalgia: Secondary | ICD-10-CM

## 2023-07-07 DIAGNOSIS — M6281 Muscle weakness (generalized): Secondary | ICD-10-CM
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
## 2023-07-10 ENCOUNTER — Encounter: Payer: Self-pay | Admitting: Adult Health

## 2023-07-10 ENCOUNTER — Ambulatory Visit: Payer: Medicaid Other | Admitting: Adult Health

## 2023-07-10 VITALS — BP 112/67 | HR 60 | Ht 68.0 in | Wt 124.0 lb

## 2023-07-10 DIAGNOSIS — R413 Other amnesia: Secondary | ICD-10-CM | POA: Diagnosis not present

## 2023-07-10 DIAGNOSIS — Z820 Family history of epilepsy and other diseases of the nervous system: Secondary | ICD-10-CM

## 2023-07-10 NOTE — Patient Instructions (Addendum)
Your Plan:  We will check lab work today to look for genetic factors in your blood for Alzheimers dementia   Continue to monitor your memory  Try to stay as active as as possible as this can help you cognitively as well  Ensure you are doing memory exercises     Follow up as needed at this time      Thank you for coming to see [place] at [facility]. I hope we have been able to provide you high quality care today.  You may receive a patient satisfaction survey over the next few weeks. We would appreciate your feedback and comments so that we may continue to improve ourselves and the health of our patients.

## 2023-07-14 ENCOUNTER — Other Ambulatory Visit: Payer: Self-pay | Admitting: Pediatrics

## 2023-07-14 ENCOUNTER — Other Ambulatory Visit: Payer: Self-pay | Admitting: Nurse Practitioner

## 2023-07-14 DIAGNOSIS — F419 Anxiety disorder, unspecified: Secondary | ICD-10-CM

## 2023-07-14 DIAGNOSIS — G8929 Other chronic pain: Secondary | ICD-10-CM

## 2023-07-15 NOTE — Telephone Encounter (Signed)
Requested medication (s) are due for refill today:   No  both have been discontinued  Requested medication (s) are on the active medication list:   No   Future visit scheduled:   Yes in 3 mo.   Last ordered: Cymbalta 30 mg was discontinued 08/20/2022.   Now on 20 mg;   Seroquel 25 mg is not on med. List.   It was discontinued 04/24/2023.      Requested Prescriptions  Pending Prescriptions Disp Refills   DULoxetine (CYMBALTA) 30 MG capsule [Pharmacy Med Name: DULOXETINE HCL DR 30 MG CAP] 90 capsule 1    Sig: TAKE 1 CAPSULE BY MOUTH EVERY DAY     Psychiatry: Antidepressants - SNRI - duloxetine Passed - 07/14/2023  3:41 PM      Passed - Cr in normal range and within 360 days    Creatinine, Ser  Date Value Ref Range Status  05/12/2023 0.71 0.57 - 1.00 mg/dL Final         Passed - eGFR is 30 or above and within 360 days    GFR calc Af Amer  Date Value Ref Range Status  09/27/2020 101 >59 mL/min/1.73 Final    Comment:    **In accordance with recommendations from the NKF-ASN Task force,**   [facility] is in the process of updating its eGFR calculation to the   2021 CKD-EPI creatinine equation that estimates kidney function   without a race variable.    GFR, Estimated  Date Value Ref Range Status  01/20/2023 >60 >60 mL/min Final    Comment:    (NOTE) Calculated using the CKD-EPI Creatinine Equation (2021)    eGFR  Date Value Ref Range Status  05/12/2023 96 >59 mL/min/1.73 Final         Passed - Completed PHQ-2 or PHQ-9 in the last 360 days      Passed - Last BP in normal range    BP Readings from Last 1 Encounters:  07/10/23 112/67         Passed - Valid encounter within last 6 months    Recent Outpatient Visits           3 weeks ago Symptoms involving urinary system   [place] [facility] Jackolyn Confer, MD   1 month ago Primary hypertension   [place] [facility] Jackolyn Confer, MD   2 months ago [facility] Jack[facility]yn Confer, MD   2 months ago Urinary frequency   [place] [facility] Jackolyn Confer, MD   4 months ago Mixed hyperlipidemia   [place] [facility] Mecum, Oswaldo Conroy, PA-C       Future Appointments             In 2 months Luciano Cutter, MD [facility] at [facility], [facility]   In 3 months Evelene Croon, Atilano Median, MD [place] [facility], [facility]             QUEtiapine (SEROQUEL) 25 MG tablet [Pharmacy Med Name: QUETIAPINE FUMARATE 25 MG TAB] 30 tablet 0    Sig: TAKE 1 TABLET BY MOUTH EVERYDAY AT BEDTIME     Not Delegated - Psychiatry:  Antipsychotics - Second Generation (Atypical) - quetiapine Failed - 07/14/2023  3:41 PM      Failed - This refill cannot be delegated      Failed - Lipid Panel in normal range within the last 12  months    Cholesterol, Total  Date Value Ref Range Status  04/14/2023 198 100 - 199 mg/dL Final   LDL Chol Calc (NIH)  Date Value Ref Range Status  04/14/2023 87 0 - 99 mg/dL Final   HDL  Date Value Ref Range Status  04/14/2023 92 >39 mg/dL Final   Triglycerides  Date Value Ref Range Status  04/14/2023 113 0 - 149 mg/dL Final         Passed - TSH in normal range and within 360 days    TSH  Date Value Ref Range Status  05/14/2023 1.47 0.35 - 5.50 uIU/mL Final         Passed - Completed PHQ-2 or PHQ-9 in the last 360 days      Passed - Last BP in normal range    BP Readings from Last 1 Encounters:  07/10/23 112/67         Passed - Last Heart Rate in normal range    Pulse Readings from Last 1 Encounters:  07/10/23 60         Passed - Valid encounter within last 6 months    Recent Outpatient Visits           3 weeks ago Symptoms involving urinary system   [place] [facility] Jackolyn Confer, MD   1 month ago Primary hypertension   [place] [facility] Jackolyn Confer, MD   2 months ago [facility]  Jackolyn Confer, MD   2 months ago Urinary frequency   [place] [facility] Jackolyn Confer, MD   4 months ago Mixed hyperlipidemia   [place] [facility] Mecum, Oswaldo Conroy, PA-C       Future Appointments             In 2 months Luciano Cutter, MD [facility] at [facility], [facility]   In 3 months Evelene Croon, Atilano Median, MD [facility], [facility]            Passed - CBC within normal limits and completed in the last 12 months    WBC  Date Value Ref Range Status  04/14/2023 4.9 3.4 - 10.8 x10E3/uL Final  01/20/2023 7.9 4.0 - 10.5 K/uL Final   RBC  Date Value Ref Range Status  04/14/2023 3.96 3.77 - 5.28 x10E6/uL Final  01/20/2023 3.85 (L) 3.87 - 5.11 MIL/uL Final   Hemoglobin  Date Value Ref Range Status  04/14/2023 13.3 11.1 - 15.9 g/dL Final   Hematocrit  Date Value Ref Range Status  04/14/2023 38.0 34.0 - 46.6 % Final   MCHC  Date Value Ref Range Status  04/14/2023 35.0 31.5 - 35.7 g/dL Final  96/11/5407 81.1 30.0 - 36.0 g/dL Final   [facility]  Date Value Ref Range Status  04/14/2023 33.6 (H) 26.6 - 33.0 pg Final  01/20/2023 32.5 26.0 - 34.0 pg Final   MCV  Date Value Ref Range Status  04/14/2023 96 79 - 97 fL Final   No results found for: "PLTCOUNTKUC", "LABPLAT", "POCPLA" RDW  Date Value Ref Range Status  04/14/2023 11.8 11.7 - 15.4 % Final         Passed - CMP within normal limits and completed in the last 12 months    Albumin  Date Value Ref Range Status  05/12/2023 4.9 3.9 - 4.9 g/dL Final   Alkaline Phosphatase  Date Value Ref Range  Status  05/12/2023 80 44 - 121 IU/L Final   ALT  Date Value Ref Range Status  05/12/2023 26 0 - 32 IU/L Final   AST  Date Value Ref Range Status  05/12/2023 22 0 - 40 IU/L Final   BUN  Date Value Ref Range Status  05/12/2023 5 (L) 8 - 27 mg/dL Final   Calcium  Date Value Ref Range Status  05/12/2023 9.6 8.7 - 10.3 mg/dL Final   CO2  Date Value Ref  Range Status  05/12/2023 20 20 - 29 mmol/L Final   Creatinine, Ser  Date Value Ref Range Status  05/12/2023 0.71 0.57 - 1.00 mg/dL Final   Glucose  Date Value Ref Range Status  05/12/2023 78 70 - 99 mg/dL Final   Glucose, Bld  Date Value Ref Range Status  01/20/2023 124 (H) 70 - 99 mg/dL Final    Comment:    Glucose reference range applies only to samples taken after fasting for at least 8 hours.   Glucose-Capillary  Date Value Ref Range Status  01/20/2023 134 (H) 70 - 99 mg/dL Final    Comment:    Glucose reference range applies only to samples taken after fasting for at least 8 hours.   Potassium  Date Value Ref Range Status  05/12/2023 4.8 3.5 - 5.2 mmol/L Final   Sodium  Date Value Ref Range Status  05/12/2023 129 (L) 134 - 144 mmol/L Final   Bilirubin Total  Date Value Ref Range Status  05/12/2023 0.4 0.0 - 1.2 mg/dL Final   Bilirubin, Direct  Date Value Ref Range Status  11/05/2021 <0.1 0.0 - 0.2 mg/dL Final   Indirect Bilirubin  Date Value Ref Range Status  11/05/2021 NOT CALCULATED 0.3 - 0.9 mg/dL Final    Comment:    Performed at [facility], [address] [place], [place] [address]   Protein, ur  Date Value Ref Range Status  11/30/2022 NEGATIVE NEGATIVE mg/dL Final   Protein,UA  Date Value Ref Range Status  06/18/2023 Negative Negative/Trace Final   Total Protein  Date Value Ref Range Status  05/12/2023 7.6 6.0 - 8.5 g/dL Final   GFR calc Af Amer  Date Value Ref Range Status  09/27/2020 101 >59 mL/min/1.73 Final    Comment:    **In accordance with recommendations from the NKF-ASN Task force,**   [facility] is in the process of updating its eGFR calculation to the   2021 CKD-EPI creatinine equation that estimates kidney function   without a race variable.    eGFR  Date Value Ref Range Status  05/12/2023 96 >59 mL/min/1.73 Final   GFR, Estimated  Date Value Ref Range Status  01/20/2023 >60 >60 mL/min Final    Comment:     (NOTE) Calculated using the CKD-EPI Creatinine Equation (2021)

## 2023-07-15 NOTE — Telephone Encounter (Signed)
Requested Prescriptions  Pending Prescriptions Disp Refills   ibuprofen (ADVIL) 600 MG tablet [Pharmacy Med Name: IBUPROFEN 600 MG TABLET] 30 tablet 2    Sig: TAKE 1 TABLET (600 MG TOTAL) BY MOUTH DAILY AS NEEDED     Analgesics:  NSAIDS Failed - 07/14/2023  3:41 PM      Failed - Manual Review: Labs are only required if the patient has taken medication for more than 8 weeks.      Passed - Cr in normal range and within 360 days    Creatinine, Ser  Date Value Ref Range Status  05/12/2023 0.71 0.57 - 1.00 mg/dL Final         Passed - HGB in normal range and within 360 days    Hemoglobin  Date Value Ref Range Status  04/14/2023 13.3 11.1 - 15.9 g/dL Final         Passed - PLT in normal range and within 360 days    Platelets  Date Value Ref Range Status  04/14/2023 328 150 - 450 x10E3/uL Final         Passed - HCT in normal range and within 360 days    Hematocrit  Date Value Ref Range Status  04/14/2023 38.0 34.0 - 46.6 % Final         Passed - eGFR is 30 or above and within 360 days    GFR calc Af Amer  Date Value Ref Range Status  09/27/2020 101 >59 mL/min/1.73 Final    Comment:    **In accordance with recommendations from the NKF-ASN Task force,**   [facility] is in the process of updating its eGFR calculation to the   2021 CKD-EPI creatinine equation that estimates kidney function   without a race variable.    GFR, Estimated  Date Value Ref Range Status  01/20/2023 >60 >60 mL/min Final    Comment:    (NOTE) Calculated using the CKD-EPI Creatinine Equation (2021)    eGFR  Date Value Ref Range Status  05/12/2023 96 >59 mL/min/1.73 Final         Passed - Patient is not pregnant      Passed - Valid encounter within last 12 months    Recent Outpatient Visits           3 weeks ago Symptoms involving urinary system   [place] [facility] Jackolyn Confer, MD   1 month ago Primary hypertension   [place] [facility] Jackolyn Confer,  MD   2 months ago [facility] Jackolyn Confer, MD   2 months ago Urinary frequency   [place] [facility] Jackolyn Confer, MD   4 months ago Mixed hyperlipidemia   [place] [facility] Mecum, Oswaldo Conroy, PA-C       Future Appointments             In 2 months Luciano Cutter, MD [facility] at [facility], [facility]   In 3 months Evelene Croon, Atilano Median, MD [facility], [facility]

## 2023-07-16 ENCOUNTER — Ambulatory Visit: Payer: Medicaid Other | Admitting: Physical Therapy

## 2023-07-16 ENCOUNTER — Encounter: Payer: Self-pay | Admitting: Physical Therapy

## 2023-07-16 ENCOUNTER — Telehealth: Payer: Self-pay

## 2023-07-16 DIAGNOSIS — M6281 Muscle weakness (generalized): Secondary | ICD-10-CM

## 2023-07-16 DIAGNOSIS — M797 Fibromyalgia: Secondary | ICD-10-CM

## 2023-07-16 DIAGNOSIS — M25511 Pain in right shoulder: Secondary | ICD-10-CM | POA: Diagnosis not present

## 2023-07-16 DIAGNOSIS — G8929 Other chronic pain: Secondary | ICD-10-CM

## 2023-07-16 DIAGNOSIS — M542 Cervicalgia: Secondary | ICD-10-CM
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
## 2023-07-16 NOTE — Telephone Encounter (Signed)
Spoke with French Ana from [facility] to have patient imaging results read. Per French Ana, says she will make a note to have it read for the patient.

## 2023-07-16 NOTE — Telephone Encounter (Signed)
Message left for the patient letting her know that the CT did not show any masses. She may call with any questions.

## 2023-07-16 NOTE — Telephone Encounter (Signed)
-----   Message from [place]a [place] sent at 07/16/2023 12:08 PM EST ----- Please let her know her CT did not show any neck masses. Normal submandibular salivary gland ----- Message ----- From: Interface, Rad Results In Sent: 07/16/2023  11:23 AM EST To: Leafy Ro, MD

## 2023-07-16 NOTE — Telephone Encounter (Signed)
Brayton Caves- can you check on this uro referral? It looks like Dr. Evelene Croon put it in 10/30  Clinical- can we get this [place] moved up the list to be read?

## 2023-07-21 ENCOUNTER — Encounter: Payer: Self-pay | Admitting: Physical Therapy

## 2023-07-21 ENCOUNTER — Ambulatory Visit: Payer: Self-pay | Admitting: *Deleted

## 2023-07-21 ENCOUNTER — Ambulatory Visit: Payer: Medicaid Other | Attending: Orthopedic Surgery | Admitting: Physical Therapy

## 2023-07-21 DIAGNOSIS — M542 Cervicalgia: Secondary | ICD-10-CM | POA: Diagnosis present

## 2023-07-21 DIAGNOSIS — M6281 Muscle weakness (generalized): Secondary | ICD-10-CM | POA: Diagnosis present

## 2023-07-21 DIAGNOSIS — G8929 Other chronic pain: Secondary | ICD-10-CM | POA: Diagnosis present

## 2023-07-21 DIAGNOSIS — M797 Fibromyalgia: Secondary | ICD-10-CM | POA: Diagnosis present

## 2023-07-21 DIAGNOSIS — M25511 Pain in right shoulder: Secondary | ICD-10-CM | POA: Diagnosis present
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
## 2023-07-21 NOTE — Telephone Encounter (Signed)
  Chief Complaint: Returned a call to pt.   She has an appt set up with Dr. Evelene Croon for all the issues of pressure and pain in her bladder, headaches and neck pain. Symptoms: These are all chronic symptoms that Dr. Evelene Croon is working on and putting in referrals to specialists.   "I've been dealing with these issues for a very very long time". Frequency: "A very very long time". Pertinent Negatives: Patient denies Needing to talk with a nurse.   She thanked me for calling "But all of this is being worked on".    Disposition: [] ED /[] Urgent Care (no appt availability in office) / [x] Appointment(In office/virtual)/ []  [facility]/ [] Home Care/ [] Refused Recommended Disposition /[] [place] [facility]/ []  Follow-up with PCP Additional Notes: Already has an appt with Dr Evelene Croon for 07/31/2023.

## 2023-07-21 NOTE — Telephone Encounter (Signed)
Message from [place] sent at 07/21/2023 10:19 AM EST  Summary: on going pain pressure in bladder + headaches/neck pain   Patient states she is in pain with pressure & pain in her bladder (she said this is ongoing) and terrible headaches with neck pain too, not severe & states all of this is ongoing that PCP is aware of.  Patient wanted to see Dr Modena Nunnery and has been scheduled for 07/31/2023 with her. Please advise if patient needs a follow up call.  Patient callback #  [phone]          Call History  Contact Date/Time Type Contact Phone/Fax User  07/21/2023 10:17 AM EST Phone (Incoming) Rusk, Treasure (Self) [phone] (H) Muck, Army Melia   Reason for Disposition  All other urine symptoms    Already has an appt set up.  Answer Assessment - Initial Assessment Questions 1. SYMPTOM: "What's the main symptom you're concerned about?" (e.g., frequency, incontinence)     I've had this a very very long time.   Having pain and pressure over my bladder area.    I'm still having headaches.   I'm having pain from fibromyalgia and my bones today from the cold weather.   Dr. Evelene Croon has put in a referral for me to have an exam where they look inside my bladder with a camera so I should be getting a call about that anytime now.   I already have an appt set with Dr. Evelene Croon to follow up on all of this.    "I appreciate you calling me though but Dr. Evelene Croon is already working on all of this".    No further triage needed. 2. ONSET: "When did the  pain and pressure in bladder area  start?"     "I've been dealing with this a very very long time".   Dr. Evelene Croon is aware of all of this. 3. PAIN: "Is there any pain?" If Yes, ask: "How bad is it?" (Scale: 1-10; mild, moderate, severe)     Over my bladder area, my fibromyalgia and joint pains are acting up due to the cold weather along with still having the headaches that Dr. Evelene Croon is aware of also along with the neck pain. 4. CAUSE: "What do you think is  causing the symptoms?"     I have fibromyalgia but not sure what is causing the bladder discomfort. 5. OTHER SYMPTOMS: "Do you have any other symptoms?" (e.g., blood in urine, fever, flank pain, pain with urination)     See above 6. PREGNANCY: "Is there any chance you are pregnant?" "When was your last menstrual period?"     N/A due to age  Protocols used: Urinary Symptoms-A-AH

## 2023-07-24 ENCOUNTER — Telehealth: Payer: Self-pay | Admitting: Pediatrics

## 2023-07-24 NOTE — Telephone Encounter (Signed)
Pt is calling to reques if referral letter can be sent to request Referral letter sent   [facility] [phone]-Fax, [phone]    Letter by Fanny Bien on 06/30/2023  June 30, 2023       [facility] [address] [place] [address]            Dear Amber Livingston:   After careful review of the medical information, your referral has been approved. See below for additional details. If you have questions regarding why this referral was placed, please contact the referring provider.     You must be actively enrolled with your insurance plan for coverage of the authorized service.  If you have any questions regarding your coverage, please contact your insurance carrier directly.   If you do not already have an appointment, please call [phone] to make an appointment.           Information for Referral #: [id number]   Diagnoses:   M50.30 (ICD-10-CM) - Degenerative disc disease, cervical   Procedures: NGE95 - AMB REFERRAL TO PHYSICAL THERAPY Authorization #:     Referring Provider Information Jackolyn Confer [facility] [phone] Referring To Provider Information [facility] SERVICES [address] [place] [address] [phone]   Referral Start Date: 06/27/2023 Referral End Date: 06/26/2024

## 2023-07-25 LAB — ATN PROFILE
A -- Beta-amyloid 42/40 Ratio: 0.092 — ABNORMAL LOW (ref >0.102)
Beta-amyloid 40: 124.37 pg/mL
Beta-amyloid 42: 11.38 pg/mL
N -- NfL, Plasma: 2.75 pg/mL (ref 0.00–4.61)
T -- p-tau181: 1.05 pg/mL — ABNORMAL HIGH (ref 0.00–0.97)

## 2023-07-25 LAB — APOE ALZHEIMER'S RISK

## 2023-07-29 ENCOUNTER — Ambulatory Visit: Payer: Medicaid Other | Admitting: Physical Therapy

## 2023-07-29 ENCOUNTER — Telehealth: Payer: Self-pay

## 2023-07-29 DIAGNOSIS — M6281 Muscle weakness (generalized): Secondary | ICD-10-CM

## 2023-07-29 DIAGNOSIS — M797 Fibromyalgia: Secondary | ICD-10-CM

## 2023-07-29 DIAGNOSIS — Z820 Family history of epilepsy and other diseases of the nervous system: Secondary | ICD-10-CM

## 2023-07-29 DIAGNOSIS — M25511 Pain in right shoulder: Secondary | ICD-10-CM | POA: Diagnosis not present

## 2023-07-29 DIAGNOSIS — M542 Cervicalgia: Secondary | ICD-10-CM

## 2023-07-29 DIAGNOSIS — G8929 Other chronic pain: Secondary | ICD-10-CM

## 2023-07-29 DIAGNOSIS — G309 Alzheimer's disease, unspecified: Secondary | ICD-10-CM

## 2023-07-29 NOTE — Telephone Encounter (Signed)
I called patient and explained results, she was understanding and wants to try the donepezil 10 mg nightly and the PET scan to be ordered. She is interested in the IV meds but understand we have to do the PET scan first. I advised I would send to provider for next steps. Pt verbalized understanding. Pt had no questions at this time but was encouraged to call back if questions arise.

## 2023-07-29 NOTE — Telephone Encounter (Signed)
-----   Message from Ihor Austin sent at 07/24/2023  4:20 PM EST ----- Please advise patient that her recent lab work was consistent with the presence of Alzheimer's related pathology, can consider pursuing PET scan for further evaluation which can provide more definitive results if she is interested. We are still waiting on genetic factors to result and will notify her if this is abnormal. She can consider starting donepezil 10mg  nightly which can help slow the progression of AD over time. There is also new infusible medications which she may be a candidate for with a goal of stopping cognitive decline but we would need to complete a PET scan if this is something she would be interested in pursuing. Thank you. (Please call patient to provide these results)
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
## 2023-07-30 MED ORDER — DONEPEZIL HCL 10 MG PO TABS: 10.0000 mg | ORAL_TABLET | Freq: Every day | ORAL | 3 refills | Status: DC

## 2023-07-30 NOTE — Addendum Note (Signed)
Addended by: Ihor Austin L on: 07/30/2023 07:11 AM   Modules accepted: Orders

## 2023-07-30 NOTE — Telephone Encounter (Signed)
Please advise patient that order was placed to start donepezil 10 mg nightly as well as ordered PET scan. She will be called to schedule. Thank you.

## 2023-07-30 NOTE — Telephone Encounter (Signed)
Spoke to patient informed her that Jessica,NP placed order for donepezil 10 mg nightly and ordered PET scan . Informed pt of possible side effects of medication and if she has any issues with medication  let office know. Pt expressed understanding and thanked me for calling

## 2023-07-30 NOTE — Addendum Note (Signed)
Addended by: Ihor Austin L on: 07/30/2023 07:10 AM   Modules accepted: Orders

## 2023-07-31 ENCOUNTER — Ambulatory Visit: Payer: Medicaid Other | Admitting: Pediatrics

## 2023-08-01 ENCOUNTER — Encounter: Payer: Self-pay | Admitting: Physical Therapy

## 2023-08-04 ENCOUNTER — Telehealth: Payer: Self-pay | Admitting: Adult Health

## 2023-08-04 NOTE — Telephone Encounter (Signed)
Called the pt back she states that since starting the donepezil medication she has not tolerated well. States causing her to wake up more often. She has noticed cold sweats, nausea and GI upset. She went on very flustered with everything stating she doesn't feel she has been educated on everything going on. I informed her I would be happy to review everything. I started from the beginning advising that at the last visit we completed a memory test which indicated there was memory decline. Advised that is what triggered her to order the ALZ panel which did indicate concerns for having protein biomarkers that are common in patients with alzhiemers. Advised that is what triggered the PET amyloid to be ordered. Advised once insurance Berkley Harvey is completed someone should contact her about scheduling that. In the meantime advised based off the testing results and the lab work it justified starting a medication that doesn't reverse memory but hopefully would help reduce the memory from worsening and that donepezil is the first line treatment, which is why that is prescribed. Advised most common side effect is GI upset and if pt's can't tolerate it then we recommend that they stop the medication.  Once I explained all this, pt felt much more calm and at ease. She is willing to reduce the donepezil to 5 mg at bedtime to see if half dose is tolerated better. If not, she will let us know.

## 2023-08-04 NOTE — Telephone Encounter (Signed)
[facility] [place]: [phone] exp. 08/04/23-10/02/23 for [facility]

## 2023-08-04 NOTE — Telephone Encounter (Signed)
Pt called and LVM stating that the new medication that she was just put on are giving her unbearable side affects and she is needing to speak to someone as soon as possible because she is not feeling well at all. Please advise.

## 2023-08-05 ENCOUNTER — Ambulatory Visit: Payer: Medicaid Other | Admitting: Pediatrics

## 2023-08-05 ENCOUNTER — Ambulatory Visit: Payer: Medicaid Other | Admitting: Physical Therapy

## 2023-08-06 ENCOUNTER — Ambulatory Visit: Payer: Medicaid Other | Admitting: Physical Therapy

## 2023-08-11 ENCOUNTER — Ambulatory Visit: Payer: Medicaid Other | Admitting: Physical Therapy

## 2023-08-11 DIAGNOSIS — M6281 Muscle weakness (generalized): Secondary | ICD-10-CM

## 2023-08-11 DIAGNOSIS — M25511 Pain in right shoulder: Secondary | ICD-10-CM | POA: Diagnosis not present

## 2023-08-11 DIAGNOSIS — M797 Fibromyalgia: Secondary | ICD-10-CM

## 2023-08-11 DIAGNOSIS — G8929 Other chronic pain: Secondary | ICD-10-CM

## 2023-08-11 DIAGNOSIS — M542 Cervicalgia: Secondary | ICD-10-CM
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
## 2023-08-14 ENCOUNTER — Telehealth: Payer: Self-pay | Admitting: Adult Health

## 2023-08-14 ENCOUNTER — Encounter (HOSPITAL_COMMUNITY)
Admission: RE | Admit: 2023-08-14 | Discharge: 2023-08-14 | Disposition: A | Discharge status: Home / Self Care | Payer: Medicaid Other | Source: Ambulatory Visit | Attending: Adult Health | Admitting: Adult Health

## 2023-08-14 DIAGNOSIS — Z820 Family history of epilepsy and other diseases of the nervous system: Secondary | ICD-10-CM | POA: Diagnosis present

## 2023-08-14 DIAGNOSIS — G309 Alzheimer's disease, unspecified: Secondary | ICD-10-CM | POA: Insufficient documentation

## 2023-08-14 MED ORDER — FLORBETAPIR F 18 500-1900 MBQ/ML IV SOLN
9.1460 | Freq: Once | INTRAVENOUS | Status: AC
  Administered 2023-08-14: 9.146 via INTRAVENOUS

## 2023-08-14 NOTE — Telephone Encounter (Signed)
Rubi from GI called wanting to give a call report to the triage nurse. Please advise.

## 2023-08-14 NOTE — Telephone Encounter (Signed)
Pt states she received her CT scan results but doesn't understand them. Requesting call back to discuss

## 2023-08-14 NOTE — Telephone Encounter (Signed)
I have routed the actual results from the testing in Jessica's NP box to Dr Frances Furbish the work in to review and result for the patient

## 2023-08-15 ENCOUNTER — Encounter: Payer: Self-pay | Admitting: Physical Therapy

## 2023-08-15 ENCOUNTER — Ambulatory Visit: Payer: Medicaid Other | Admitting: Physical Therapy

## 2023-08-15 DIAGNOSIS — M6281 Muscle weakness (generalized): Secondary | ICD-10-CM

## 2023-08-15 DIAGNOSIS — M542 Cervicalgia: Secondary | ICD-10-CM

## 2023-08-15 DIAGNOSIS — G8929 Other chronic pain: Secondary | ICD-10-CM

## 2023-08-15 DIAGNOSIS — M25511 Pain in right shoulder: Secondary | ICD-10-CM | POA: Diagnosis not present

## 2023-08-15 DIAGNOSIS — M797 Fibromyalgia: Secondary | ICD-10-CM

## 2023-08-15 NOTE — Telephone Encounter (Signed)
Called the patient and reviewed the results and she was very upset thinking that we missed this since it was never completed originally when she saw Dr Delena Bali. I advised at the time when she first saw Dr Delena Bali we didn't have the capability to order PET Amyloid testing or ALZ panel blood work because it was not a commonly know practice of care at that time. We have recently been able to start ordering these test at which point we have ordered and completed and she didn't have a decline in MOCA score as well as we initially started her on medication to help with memory which she actually had a reaction to that. I was able to schedule an apt on Monday with Shanda Bumps NP so that she can further ask questions  From Dr Frances Furbish, "Please advise patient that her recent brain PET scan would support underlying Alzheimer's dementia. I recommend she schedule a FU appointment with her neuro provider to discuss further.

## 2023-08-15 NOTE — Telephone Encounter (Signed)
Called GI, and spoke to someone and informed that I do have access to the results online and have already had the work in MD review the results. She was appreciative for the call back and will close on their end.
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
## 2023-08-15 NOTE — Telephone Encounter (Signed)
Jane from GI needing to actually speak to someone about the results. She states even if the results are seen or sent to provider, they still need to actually give the report to someone verbally. Please call [phone]

## 2023-08-18 ENCOUNTER — Ambulatory Visit: Payer: Medicaid Other | Admitting: Adult Health

## 2023-08-18 ENCOUNTER — Telehealth: Payer: Self-pay | Admitting: Adult Health

## 2023-08-18 ENCOUNTER — Telehealth: Payer: Medicaid Other | Admitting: Adult Health

## 2023-08-18 ENCOUNTER — Encounter: Payer: Self-pay | Admitting: Adult Health

## 2023-08-18 DIAGNOSIS — G300 Alzheimer's disease with early onset: Secondary | ICD-10-CM | POA: Diagnosis not present

## 2023-08-18 DIAGNOSIS — G309 Alzheimer's disease, unspecified: Secondary | ICD-10-CM

## 2023-08-18 DIAGNOSIS — F02A0 Dementia in other diseases classified elsewhere, mild, without behavioral disturbance, psychotic disturbance, mood disturbance, and anxiety: Secondary | ICD-10-CM | POA: Diagnosis not present

## 2023-08-18 MED ORDER — DONEPEZIL HCL 5 MG PO TABS: 5.0000 mg | ORAL_TABLET | Freq: Every day | ORAL | 11 refills | Status: DC
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
## 2023-08-18 NOTE — Telephone Encounter (Signed)
Pt has called to r/s her appointment for today, pt has been given the next available appointment with wait list.  Pt states she really needs to be seen as soon as possible as a result of recent diagnosis and her medication( donepezil (ARICEPT) 10 MG tablet) not working for her at all.

## 2023-08-18 NOTE — Telephone Encounter (Signed)
Referral for neurology fax to [facility] for [facility]. Phone:[phone], Fax: [phone].

## 2023-08-19 ENCOUNTER — Ambulatory Visit: Payer: Medicaid Other | Admitting: Physical Therapy

## 2023-08-19 DIAGNOSIS — M542 Cervicalgia: Secondary | ICD-10-CM

## 2023-08-19 DIAGNOSIS — M797 Fibromyalgia: Secondary | ICD-10-CM

## 2023-08-19 DIAGNOSIS — M25511 Pain in right shoulder: Secondary | ICD-10-CM | POA: Diagnosis not present

## 2023-08-19 DIAGNOSIS — G8929 Other chronic pain: Secondary | ICD-10-CM

## 2023-08-19 DIAGNOSIS — M6281 Muscle weakness (generalized): Secondary | ICD-10-CM
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
## 2023-08-22 ENCOUNTER — Ambulatory Visit: Payer: Medicaid Other | Attending: Pediatrics | Admitting: Physical Therapy

## 2023-08-22 DIAGNOSIS — M6281 Muscle weakness (generalized): Secondary | ICD-10-CM | POA: Diagnosis present

## 2023-08-22 DIAGNOSIS — M542 Cervicalgia: Secondary | ICD-10-CM | POA: Diagnosis present

## 2023-08-22 DIAGNOSIS — G8929 Other chronic pain: Secondary | ICD-10-CM | POA: Insufficient documentation

## 2023-08-22 DIAGNOSIS — M25511 Pain in right shoulder: Secondary | ICD-10-CM | POA: Diagnosis present

## 2023-08-22 DIAGNOSIS — M797 Fibromyalgia: Secondary | ICD-10-CM | POA: Insufficient documentation

## 2023-08-26 ENCOUNTER — Encounter: Payer: Self-pay | Admitting: Physical Therapy

## 2023-08-26 ENCOUNTER — Ambulatory Visit: Payer: Medicaid Other | Admitting: Pediatrics

## 2023-08-26 ENCOUNTER — Ambulatory Visit: Payer: Medicaid Other | Admitting: Physical Therapy

## 2023-08-26 DIAGNOSIS — M797 Fibromyalgia: Secondary | ICD-10-CM

## 2023-08-26 DIAGNOSIS — G8929 Other chronic pain: Secondary | ICD-10-CM

## 2023-08-26 DIAGNOSIS — M6281 Muscle weakness (generalized): Secondary | ICD-10-CM

## 2023-08-26 DIAGNOSIS — M542 Cervicalgia: Secondary | ICD-10-CM | POA: Diagnosis not present
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
## 2023-08-26 NOTE — Progress Notes (Deleted)
   Office Visit  There were no vitals taken for this visit.   Subjective:    Patient ID: Amber Livingston, female    DOB: 1961-04-16, 63 y.o.   MRN: [id number]  HPI: Amber Livingston is a 63 y.o. female  No chief complaint on file.   Discussed the use of AI scribe software for clinical note transcription with the patient, who gave verbal consent to proceed.  History of Present Illness            Relevant past medical, surgical, family and social history reviewed and updated as indicated. Interim medical history since our last visit reviewed. Allergies and medications reviewed and updated.  ROS per HPI unless specifically indicated above     Objective:    There were no vitals taken for this visit.  Wt Readings from Last 3 Encounters:  07/10/23 124 lb (56.2 kg)  06/23/23 120 lb 9.6 oz (54.7 kg)  06/18/23 124 lb (56.2 kg)     Physical Exam      06/18/2023    2:38 PM 03/07/2023    1:16 PM 12/25/2022    2:52 PM 12/03/2022    9:54 AM 11/07/2022    2:32 PM  Depression screen PHQ 2/9  Decreased Interest 0 2 3 1 1   Down, Depressed, Hopeless 0 1 1 2 2   PHQ - 2 Score 0 3 4 3 3   Altered sleeping 0 1 3 0 1  Tired, decreased energy 0 3 3 1 3   Change in appetite 0 2 3 1 2   Feeling bad or failure about yourself  0 0 0 0 0  Trouble concentrating 0 1 3 1 2   Moving slowly or fidgety/restless 0 1 0 0 1  Suicidal thoughts 0 0 0 0 0  PHQ-9 Score 0 11 16 6 12   Difficult doing work/chores Not difficult at all Somewhat difficult Extremely dIfficult Extremely dIfficult        06/18/2023    2:38 PM 03/07/2023    1:16 PM 12/25/2022    2:52 PM 12/03/2022    9:54 AM  GAD 7 : Generalized Anxiety Score  Nervous, Anxious, on Edge 0 1  0  Control/stop worrying 0 1 0 0  Worry too much - different things 0 1 0 0  Trouble relaxing 0 1 2 0  Restless 0 2 3 0  Easily annoyed or irritable 0 1 2 2   Afraid - awful might happen 0 0 2 0  Total GAD 7 Score 0 7  2  Anxiety Difficulty Not difficult at all  Somewhat difficult Extremely difficult Somewhat difficult       Assessment & Plan:  Assessment & Plan   There are no diagnoses linked to this encounter.   Assessment and Plan              Follow up plan: No follow-ups on file.  Ammanda Dobbins SHAUNNA NETT, MD

## 2023-08-27 ENCOUNTER — Encounter: Payer: Self-pay | Admitting: Obstetrics

## 2023-08-27 ENCOUNTER — Telehealth: Payer: Self-pay | Admitting: Pediatrics

## 2023-08-27 ENCOUNTER — Encounter: Payer: Self-pay | Admitting: Pediatrics

## 2023-08-27 ENCOUNTER — Ambulatory Visit: Payer: Medicaid Other | Admitting: Pediatrics

## 2023-08-27 ENCOUNTER — Telehealth: Payer: Self-pay

## 2023-08-27 ENCOUNTER — Ambulatory Visit: Payer: Medicaid Other | Admitting: Obstetrics

## 2023-08-27 VITALS — BP 146/78 | HR 57 | Ht 66.0 in | Wt 116.4 lb

## 2023-08-27 VITALS — BP 127/78 | HR 68 | Temp 98.7°F | Resp 16 | Wt 122.0 lb

## 2023-08-27 DIAGNOSIS — N39492 Postural (urinary) incontinence: Secondary | ICD-10-CM

## 2023-08-27 DIAGNOSIS — R350 Frequency of micturition: Secondary | ICD-10-CM

## 2023-08-27 DIAGNOSIS — E871 Hypo-osmolality and hyponatremia: Secondary | ICD-10-CM | POA: Diagnosis not present

## 2023-08-27 DIAGNOSIS — F02A0 Dementia in other diseases classified elsewhere, mild, without behavioral disturbance, psychotic disturbance, mood disturbance, and anxiety: Secondary | ICD-10-CM | POA: Diagnosis not present

## 2023-08-27 DIAGNOSIS — Z1330 Encounter for screening examination for mental health and behavioral disorders, unspecified: Secondary | ICD-10-CM

## 2023-08-27 DIAGNOSIS — M797 Fibromyalgia: Secondary | ICD-10-CM

## 2023-08-27 DIAGNOSIS — G300 Alzheimer's disease with early onset: Secondary | ICD-10-CM | POA: Diagnosis not present

## 2023-08-27 DIAGNOSIS — N952 Postmenopausal atrophic vaginitis: Secondary | ICD-10-CM

## 2023-08-27 LAB — POCT URINALYSIS DIPSTICK
Bilirubin, UA: NEGATIVE
Blood, UA: NEGATIVE
Glucose, UA: NEGATIVE
Ketones, UA: NEGATIVE
Leukocytes, UA: NEGATIVE
Nitrite, UA: NEGATIVE
Protein, UA: NEGATIVE
Spec Grav, UA: 1.02 (ref 1.010–1.025)
Urobilinogen, UA: 0.2 U/dL
pH, UA: 7 (ref 5.0–8.0)

## 2023-08-27 MED ORDER — ESTROGENS CONJUGATED 0.625 MG/GM VA CREA: TOPICAL_CREAM | VAGINAL | 2 refills | Status: DC

## 2023-08-27 MED ORDER — PREGABALIN 25 MG PO CAPS: 25.0000 mg | ORAL_CAPSULE | Freq: Every day | ORAL | 1 refills | Status: DC

## 2023-08-27 MED ORDER — ESTRADIOL 0.1 MG/GM VA CREA
TOPICAL_CREAM | VAGINAL | 3 refills | Status: DC
Start: 2023-08-28 — End: 2023-08-27

## 2023-08-27 NOTE — Assessment & Plan Note (Signed)
 Patient has been advised to reduce water intake due to sodium imbalance. Would like to reschedule nephrology follow up later next week. Repeat labs today and if normal ok to reschedule. Overwhelmed by amount of visits she has.

## 2023-08-27 NOTE — Telephone Encounter (Signed)
 Pt called regarding her missing Tylenol 3, says she discussed this with her PCP today.  [facility] [address] [place], [place] - [address]  [address] [place] [address]  Phone: [phone] Fax: [phone]

## 2023-08-27 NOTE — Assessment & Plan Note (Addendum)
 Severe, episodic pain affecting quality of life and daily routine. Current management with Cymbalta  and physical therapy. Pain exacerbated by cold weather. Mood decline suspect due to increase/breakthrough pain. Does not want to adjust prozac dose at this time. -Restart Lyrica  to 30mg  daily given breakthrough pain -Continue physical therapy.

## 2023-08-27 NOTE — Assessment & Plan Note (Addendum)
-   encouraged pelvic relaxation during voids - discussed fluid management, caffeine reduction, reassess symptoms at follow-up - encouraged to consider pelvic floor PT

## 2023-08-27 NOTE — Assessment & Plan Note (Addendum)
-   drinks 120oz water, 3-4 cups of coffee, smoothies - POCT UA negative, bladder scan 16mL - previously recommended fluid restriction by nephrology with hospital admission for hyponatremia in 2022 - We discussed the symptoms of overactive bladder (OAB), which include urinary urgency, urinary frequency, nocturia, with or without urge incontinence.  While we do not know the exact etiology of OAB, several treatment options exist. We discussed management including behavioral therapy (decreasing bladder irritants, urge suppression strategies, timed voids, bladder retraining), physical therapy, medication; for refractory cases posterior tibial nerve stimulation, sacral neuromodulation, and intravesical botulinum toxin injection.  For anticholinergic medications, we discussed the potential side effects of anticholinergics including dry eyes, dry mouth, constipation, cognitive impairment and urinary retention. Will avoid due to concerns with memory problems For Beta-3 agonist medication, we discussed the potential side effect of elevated blood pressure which is more likely to occur in individuals with uncontrolled hypertension. - reviewed and provided handout for fluid management and bladder training, caffeine reduction - encouraged to consider pelvic floor PT since pt responded well to PT for cervical pain and fibromyalgia - pt desires to avoid medication

## 2023-08-27 NOTE — Assessment & Plan Note (Addendum)
 Concerns about memory loss and early onset Alzheimer's. Patient has started Donezepil 5mg , titrating from 2.5 due to intolerance/GI side effects.  Concerned about long weight to memory clinic. -Continue new medication at 2.5mg  daily, increase to 5mg  after one week. -Will look into [facility] wait time for further evaluation
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
## 2023-08-27 NOTE — Progress Notes (Signed)
 Office Visit  BP 127/78 (BP Location: Left Arm, Patient Position: Sitting, Cuff Size: Normal)   Pulse 68   Temp 98.7 F (37.1 C) (Oral)   Resp 16   Wt 122 lb (55.3 kg)   SpO2 98%   BMI 19.69 kg/m    Subjective:    Patient ID: Amber Livingston, female    DOB: 1961-07-22, 63 y.o.   MRN: [id number]  HPI: Amber Livingston is a 63 y.o. female  Chief Complaint  Patient presents with   Follow-up    Review labs, consider new labs. New diagnosis of alzheimer. Can't get in with neuro 11/24/2023.     Discussed the use of AI scribe software for clinical note transcription with the patient, who gave verbal consent to proceed.  History of Present Illness   The patient, with a history of fibromyalgia, respiratory issues, and a recent diagnosis of early-onset Alzheimer's, presents with a primary concern of unmanageable pain. The patient reports a significant increase in episodic pain, particularly during colder weather, which has been so severe that it has led to periods of bed rest. The pain has been so debilitating that it has disrupted the patient's daily routine and ability to maintain a consistent lifestyle, which she believes is crucial for managing her overall health.  The patient has been trying various methods to manage her pain, including physical therapy, anti-inflammatory diet, and cannabis, which she reports has helped to some extent. However, the pain has been progressing, and the patient expresses feelings of frustration and despair, questioning the point of her efforts.  The patient has been prescribed a new medication for her Alzheimer's, which she has been taking at a reduced dose due to severe vomiting experienced when initially starting the medication. 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 The patient has a history of using Lyrica , which she found beneficial, and has expressed a  desire to restart this medication.  The patient also reports issues with her urinary system, having been diagnosed with a prolapse pelvic situation. She has been referred for physical therapy for this issue. She has also been advised to reduce her water intake due to concerns about sodium levels.  The patient's pain and health issues have had a significant impact on her mental health, with the patient reporting that she is constantly fighting pain. She expresses concern about her deteriorating memory and has been prescribed medication to slow the progression of plaque buildup associated with Alzheimer's. However, she has been taking this medication at a reduced dose due to severe side effects.  The patient has a history of using Vicodin and Soma for pain management, but stopped due to dissatisfaction with the effects and the cost of the medications. She expresses a desire to try Tylenol  3 as an alternative. The patient is committed to managing her health and is planning to undertake a fast to reset her system. She expresses a strong desire for a patient advocate to help her navigate her health issues.     Relevant past medical, surgical, family and social history reviewed and updated as indicated. Interim medical history since our last visit reviewed. Allergies and medications reviewed and updated.  ROS per HPI unless specifically indicated above     Objective:    BP 127/78 (BP Location: Left Arm, Patient Position: Sitting, Cuff Size: Normal)   Pulse 68   Temp 98.7 F (37.1 C) (Oral)  Resp 16   Wt 122 lb (55.3 kg)   SpO2 98%   BMI 19.69 kg/m   Wt Readings from Last 3 Encounters:  08/27/23 122 lb (55.3 kg)  08/27/23 116 lb 6.4 oz (52.8 kg)  07/10/23 124 lb (56.2 kg)     Physical Exam Constitutional:      Appearance: Normal appearance.  Pulmonary:     Effort: Pulmonary effort is normal.  Musculoskeletal:        General: Normal range of motion.  Skin:    Comments: Normal skin  color  Neurological:     General: No focal deficit present.     Mental Status: She is alert. Mental status is at baseline.  Psychiatric:        Mood and Affect: Mood normal.        Behavior: Behavior normal.        Thought Content: Thought content normal.        08/27/2023   10:59 AM 06/18/2023    2:38 PM 03/07/2023    1:16 PM 12/25/2022    2:52 PM 12/03/2022    9:54 AM  Depression screen PHQ 2/9  Decreased Interest 3 0 2 3 1   Down, Depressed, Hopeless 3 0 1 1 2   PHQ - 2 Score 6 0 3 4 3   Altered sleeping 3 0 1 3 0  Tired, decreased energy 3 0 3 3 1   Change in appetite 2 0 2 3 1   Feeling bad or failure about yourself  0 0 0 0 0  Trouble concentrating 2 0 1 3 1   Moving slowly or fidgety/restless 3 0 1 0 0  Suicidal thoughts 0 0 0 0 0  PHQ-9 Score 19 0 11 16 6   Difficult doing work/chores Somewhat difficult Not difficult at all Somewhat difficult Extremely dIfficult Extremely dIfficult       08/27/2023   11:02 AM 06/18/2023    2:38 PM 03/07/2023    1:16 PM 12/25/2022    2:52 PM  GAD 7 : Generalized Anxiety Score  Nervous, Anxious, on Edge 3 0 1   Control/stop worrying 3 0 1 0  Worry too much - different things 3 0 1 0  Trouble relaxing 3 0 1 2  Restless 3 0 2 3  Easily annoyed or irritable 3 0 1 2  Afraid - awful might happen 3 0 0 2  Total GAD 7 Score 21 0 7   Anxiety Difficulty Somewhat difficult Not difficult at all Somewhat difficult Extremely difficult       Assessment & Plan:  Assessment & Plan   Fibromyalgia Assessment & Plan: Severe, episodic pain affecting quality of life and daily routine. Current management with Cymbalta  and physical therapy. Pain exacerbated by cold weather. Mood decline suspect due to increase/breakthrough pain. Does not want to adjust prozac dose at this time. -Restart Lyrica  to 30mg  daily given breakthrough pain -Continue physical therapy.  Orders: -     Pregabalin ; Take 1 capsule (25 mg total) by mouth daily.  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 Patient has started Donezepil 5mg , titrating from 2.5 due to intolerance/GI side effects.  Concerned about long weight to memory clinic. -Continue new medication at 2.5mg  daily, increase to 5mg  after one week. -Will look into [facility] wait time for further evaluation   Hyponatremia Assessment &  Plan: Patient has been advised to reduce water intake due to sodium imbalance. Would like to reschedule nephrology follow up later next week. Repeat labs today and if normal ok to reschedule. Overwhelmed by amount of visits she has.  Orders: -     Comprehensive metabolic panel  Encounter for behavioral health screening As part of their intake evaluation, the patient was screened for depression, anxiety.  PHQ9 SCORE 19, GAD7 SCORE 21. Screening results positive for tested conditions. See plan under problem/diagnosis above.   Follow up plan: Return if symptoms worsen or fail to improve.  Edrie Ehrich SHAUNNA NETT, MD

## 2023-08-27 NOTE — Patient Instructions (Addendum)
 We discussed the symptoms of overactive bladder (OAB), which include urinary urgency, urinary frequency, night-time urination, with or without urge incontinence.  We discussed management including behavioral therapy (decreasing bladder irritants by following a bladder diet, urge suppression strategies, timed voids, bladder retraining), physical therapy, medication; and for refractory cases posterior tibial nerve stimulation, sacral neuromodulation, and intravesical botulinum toxin injection.   For Beta-3 agonist medication, we discussed the potential side effect of elevated blood pressure which is more likely to occur in individuals with uncontrolled hypertension. You were given samples for Gemtesa 75 mg.  It can take a month to start working so give it time, but if you have bothersome side effects call sooner and we can try a different medication.  Call us  if you have trouble filling the prescription or if it's not covered by your insurance.  For vaginal atrophy (thinning of the vaginal tissue that can cause dryness and burning) and UTI prevention we discussed estrogen replacement in the form of vaginal cream.   Start vaginal estrogen therapy 2 times weekly at night. This can be placed with your finger or an applicator inside the vagina and around the urethra.  Please let us  know if the prescription is too expensive and we can look for alternative options.   Is vaginal estrogen therapy safe for me? Vaginal estrogen preparations act on the vaginal skin, and only a very tiny amount is absorbed into the bloodstream (0.01%).  They work in a similar way to hand or face cream.  There is minimal absorption and they are therefore perfectly safe. If you have had breast cancer and have persistent troublesome symptoms which aren't settling with vaginal moisturisers and lubricants, local estrogen treatment may be a possibility, but consultation with your oncologist should take place first.

## 2023-08-27 NOTE — Assessment & Plan Note (Signed)
-   using 0.5mg  estradiol  PO for HRT - atrophic vaginal epithelium with urethral discomfort. No skin changes, lesion, with normal contour of urethra and urethral meatus on exam - For symptomatic vaginal atrophy options include lubrication with a water-based lubricant, personal hygiene measures and barrier protection against wetness, and estrogen replacement in the form of vaginal cream, vaginal tablets, or a time-released vaginal ring.   - Rx low dose vaginal estrogen maintenance dose 1g twice a week - encouraged barrier ointment use and avoid wipe use postvoid
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
## 2023-08-27 NOTE — Patient Instructions (Addendum)
 Start 25mg  lyrica daily  Message me once you go up to 5mg  on the donezapil

## 2023-08-27 NOTE — Telephone Encounter (Signed)
 Estrace vaginal cream is not covered with Medicaid.  Please see alternatives listed below.  Esting, Premarin and Vagifem.  I did try to submit a PA, will await response.

## 2023-08-28 ENCOUNTER — Encounter: Payer: Self-pay | Admitting: Physical Therapy

## 2023-08-28 ENCOUNTER — Ambulatory Visit: Payer: Medicaid Other | Admitting: Physical Therapy

## 2023-08-28 DIAGNOSIS — M797 Fibromyalgia: Secondary | ICD-10-CM

## 2023-08-28 DIAGNOSIS — M542 Cervicalgia: Secondary | ICD-10-CM | POA: Diagnosis not present

## 2023-08-28 DIAGNOSIS — G8929 Other chronic pain: Secondary | ICD-10-CM

## 2023-08-28 DIAGNOSIS — M6281 Muscle weakness (generalized): Secondary | ICD-10-CM

## 2023-08-28 LAB — COMPREHENSIVE METABOLIC PANEL
ALT: 21 [IU]/L (ref 0–32)
AST: 22 [IU]/L (ref 0–40)
Albumin: 5.1 g/dL — ABNORMAL HIGH (ref 3.9–4.9)
Alkaline Phosphatase: 80 [IU]/L (ref 44–121)
BUN/Creatinine Ratio: 11 — ABNORMAL LOW (ref 12–28)
BUN: 7 mg/dL — ABNORMAL LOW (ref 8–27)
Bilirubin Total: 0.5 mg/dL (ref 0.0–1.2)
CO2: 20 mmol/L (ref 20–29)
Calcium: 10.2 mg/dL (ref 8.7–10.3)
Chloride: 97 mmol/L (ref 96–106)
Creatinine, Ser: 0.65 mg/dL (ref 0.57–1.00)
Globulin, Total: 2.5 g/dL (ref 1.5–4.5)
Glucose: 82 mg/dL (ref 70–99)
Potassium: 4.8 mmol/L (ref 3.5–5.2)
Sodium: 135 mmol/L (ref 134–144)
Total Protein: 7.6 g/dL (ref 6.0–8.5)
eGFR: 99 mL/min/{1.73_m2} (ref >59)

## 2023-08-28 NOTE — Telephone Encounter (Signed)
 Routing to provider. Was this going to be sent in for the patient?

## 2023-08-28 NOTE — Telephone Encounter (Signed)
 Called and notified patient of Dr. Carie Caddy message. Patient verbalized understanding.

## 2023-08-29 NOTE — Telephone Encounter (Signed)
 Patient is aware of the new Rx at the pharmacy. She will pick it up soon.
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
## 2023-09-02 ENCOUNTER — Encounter: Payer: Self-pay | Admitting: Physical Therapy

## 2023-09-02 ENCOUNTER — Ambulatory Visit: Payer: Medicaid Other | Admitting: Physical Therapy

## 2023-09-02 ENCOUNTER — Telehealth: Payer: Self-pay | Admitting: Obstetrics and Gynecology

## 2023-09-02 DIAGNOSIS — M797 Fibromyalgia: Secondary | ICD-10-CM

## 2023-09-02 DIAGNOSIS — M542 Cervicalgia: Secondary | ICD-10-CM | POA: Diagnosis not present

## 2023-09-02 DIAGNOSIS — G8929 Other chronic pain: Secondary | ICD-10-CM

## 2023-09-02 DIAGNOSIS — M6281 Muscle weakness (generalized): Secondary | ICD-10-CM

## 2023-09-02 NOTE — Telephone Encounter (Signed)
 Reached out to pt to reschedule Jan. 22nd virtual visit that is scheduled with Dr. Logan Bores.  (There has been a schedule change.)  He has availability on Jan. 23 at 7:55.  Left message for pt to call back.
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
## 2023-09-04 ENCOUNTER — Encounter: Payer: Self-pay | Admitting: Physical Therapy

## 2023-09-04 ENCOUNTER — Ambulatory Visit: Payer: Medicaid Other | Admitting: Obstetrics

## 2023-09-04 ENCOUNTER — Ambulatory Visit: Payer: Self-pay | Admitting: Physical Therapy

## 2023-09-04 DIAGNOSIS — M542 Cervicalgia: Secondary | ICD-10-CM

## 2023-09-04 DIAGNOSIS — M6281 Muscle weakness (generalized): Secondary | ICD-10-CM

## 2023-09-04 DIAGNOSIS — G8929 Other chronic pain: Secondary | ICD-10-CM

## 2023-09-04 DIAGNOSIS — M797 Fibromyalgia: Secondary | ICD-10-CM

## 2023-09-04 NOTE — Telephone Encounter (Signed)
Pt has been rescheduled to 08/22/2023 at 7:55 for virtual visit with DR. Evans.

## 2023-09-09 ENCOUNTER — Ambulatory Visit: Payer: Medicaid Other

## 2023-09-09 DIAGNOSIS — M542 Cervicalgia: Secondary | ICD-10-CM | POA: Diagnosis not present

## 2023-09-09 DIAGNOSIS — M797 Fibromyalgia: Secondary | ICD-10-CM

## 2023-09-09 DIAGNOSIS — M6281 Muscle weakness (generalized): Secondary | ICD-10-CM

## 2023-09-09 DIAGNOSIS — G8929 Other chronic pain: Secondary | ICD-10-CM
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
## 2023-09-10 ENCOUNTER — Telehealth: Payer: Medicaid Other | Admitting: Obstetrics and Gynecology

## 2023-09-11 ENCOUNTER — Telehealth: Payer: Medicaid Other | Admitting: Obstetrics and Gynecology

## 2023-09-11 ENCOUNTER — Ambulatory Visit: Payer: Medicaid Other | Admitting: Physical Therapy

## 2023-09-11 ENCOUNTER — Encounter: Payer: Self-pay | Admitting: Obstetrics and Gynecology

## 2023-09-11 DIAGNOSIS — Z79890 Hormone replacement therapy (postmenopausal): Secondary | ICD-10-CM | POA: Diagnosis not present
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
## 2023-09-16 ENCOUNTER — Encounter: Payer: Self-pay | Admitting: Neurology

## 2023-09-16 ENCOUNTER — Ambulatory Visit: Payer: Self-pay | Admitting: Neurology

## 2023-09-16 ENCOUNTER — Ambulatory Visit: Payer: Medicaid Other | Admitting: Physical Therapy

## 2023-09-16 ENCOUNTER — Encounter: Payer: Self-pay | Admitting: Physical Therapy

## 2023-09-16 ENCOUNTER — Telehealth: Payer: Self-pay | Admitting: Anesthesiology

## 2023-09-16 VITALS — BP 128/78 | HR 68 | Ht 67.5 in | Wt 126.0 lb

## 2023-09-16 DIAGNOSIS — R413 Other amnesia: Secondary | ICD-10-CM | POA: Diagnosis not present

## 2023-09-16 DIAGNOSIS — G8929 Other chronic pain: Secondary | ICD-10-CM

## 2023-09-16 DIAGNOSIS — Z820 Family history of epilepsy and other diseases of the nervous system: Secondary | ICD-10-CM | POA: Insufficient documentation

## 2023-09-16 DIAGNOSIS — M542 Cervicalgia: Secondary | ICD-10-CM

## 2023-09-16 DIAGNOSIS — M6281 Muscle weakness (generalized): Secondary | ICD-10-CM
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
## 2023-09-17 ENCOUNTER — Telehealth: Payer: Self-pay | Admitting: Neurology

## 2023-09-17 LAB — VITAMIN D 25 HYDROXY (VIT D DEFICIENCY, FRACTURES): Vit D, 25-Hydroxy: 42.1 ng/mL (ref 30.0–100.0)

## 2023-09-17 LAB — ANA W/REFLEX IF POSITIVE: Anti Nuclear Antibody (ANA): NEGATIVE

## 2023-09-17 LAB — SEDIMENTATION RATE: Sed Rate: 2 mm/h (ref 0–40)

## 2023-09-17 LAB — C-REACTIVE PROTEIN: CRP: <1 mg/L (ref 0–10)

## 2023-09-17 LAB — CK: Total CK: 114 U/L (ref 32–182)

## 2023-09-17 NOTE — Telephone Encounter (Signed)
Referral for neuropsychology fax to [facility]. [phone], Fax: [phone]
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
## 2023-09-18 ENCOUNTER — Ambulatory Visit: Payer: Medicaid Other

## 2023-09-18 ENCOUNTER — Encounter: Payer: Self-pay | Admitting: Physical Therapy

## 2023-09-18 DIAGNOSIS — M542 Cervicalgia: Secondary | ICD-10-CM | POA: Diagnosis not present

## 2023-09-18 DIAGNOSIS — M6281 Muscle weakness (generalized): Secondary | ICD-10-CM

## 2023-09-18 DIAGNOSIS — G8929 Other chronic pain: Secondary | ICD-10-CM

## 2023-09-18 DIAGNOSIS — M797 Fibromyalgia: Secondary | ICD-10-CM

## 2023-09-19 ENCOUNTER — Encounter: Payer: Self-pay | Admitting: Neurology

## 2023-09-22 ENCOUNTER — Encounter (HOSPITAL_BASED_OUTPATIENT_CLINIC_OR_DEPARTMENT_OTHER): Payer: Medicaid Other
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
## 2023-09-23 ENCOUNTER — Ambulatory Visit: Payer: Medicaid Other | Admitting: Pediatrics

## 2023-09-23 ENCOUNTER — Ambulatory Visit: Payer: Medicaid Other | Attending: Pediatrics

## 2023-09-23 ENCOUNTER — Encounter: Payer: Self-pay | Admitting: Physical Therapy

## 2023-09-23 ENCOUNTER — Ambulatory Visit (HOSPITAL_BASED_OUTPATIENT_CLINIC_OR_DEPARTMENT_OTHER): Payer: Medicaid Other | Admitting: Pulmonary Disease

## 2023-09-23 ENCOUNTER — Encounter: Payer: Self-pay | Admitting: Pediatrics

## 2023-09-23 VITALS — BP 142/75 | HR 63 | Ht 67.0 in | Wt 127.0 lb

## 2023-09-23 DIAGNOSIS — M25511 Pain in right shoulder: Secondary | ICD-10-CM | POA: Diagnosis present

## 2023-09-23 DIAGNOSIS — G8929 Other chronic pain: Secondary | ICD-10-CM | POA: Insufficient documentation

## 2023-09-23 DIAGNOSIS — M542 Cervicalgia: Secondary | ICD-10-CM | POA: Insufficient documentation

## 2023-09-23 DIAGNOSIS — J449 Chronic obstructive pulmonary disease, unspecified: Secondary | ICD-10-CM | POA: Diagnosis not present

## 2023-09-23 DIAGNOSIS — M797 Fibromyalgia: Secondary | ICD-10-CM | POA: Diagnosis not present

## 2023-09-23 DIAGNOSIS — M6281 Muscle weakness (generalized): Secondary | ICD-10-CM | POA: Insufficient documentation

## 2023-09-23 DIAGNOSIS — L309 Dermatitis, unspecified: Secondary | ICD-10-CM | POA: Diagnosis not present

## 2023-09-23 DIAGNOSIS — Z1330 Encounter for screening examination for mental health and behavioral disorders, unspecified: Secondary | ICD-10-CM | POA: Diagnosis not present

## 2023-09-23 LAB — PULMONARY FUNCTION TEST
DL/VA % pred: 78 %
DL/VA: 3.24 ml/min/mmHg/L
DLCO cor % pred: 77 %
DLCO cor: 17.08 ml/min/mmHg
DLCO unc % pred: 77 %
DLCO unc: 17.08 ml/min/mmHg
FEF 25-75 Post: 1.68 L/s
FEF 25-75 Pre: 1.54 L/s
FEF2575-%Change-Post: 8 %
FEF2575-%Pred-Post: 68 %
FEF2575-%Pred-Pre: 63 %
FEV1-%Change-Post: 0 %
FEV1-%Pred-Post: 91 %
FEV1-%Pred-Pre: 90 %
FEV1-Post: 2.54 L
FEV1-Pre: 2.53 L
FEV1FVC-%Change-Post: -2 %
FEV1FVC-%Pred-Pre: 85 %
FEV6-%Change-Post: 2 %
FEV6-%Pred-Post: 110 %
FEV6-%Pred-Pre: 107 %
FEV6-Post: 3.87 L
FEV6-Pre: 3.76 L
FEV6FVC-%Change-Post: 0 %
FEV6FVC-%Pred-Post: 102 %
FEV6FVC-%Pred-Pre: 102 %
FVC-%Change-Post: 3 %
FVC-%Pred-Post: 108 %
FVC-%Pred-Pre: 105 %
FVC-Post: 3.94 L
FVC-Pre: 3.81 L
Post FEV1/FVC ratio: 65 %
Post FEV6/FVC ratio: 99 %
Pre FEV1/FVC ratio: 67 %
Pre FEV6/FVC Ratio: 99 %
RV % pred: 70 %
RV: 1.54 L
TLC % pred: 102 %
TLC: 5.65 L

## 2023-09-23 MED ORDER — DULOXETINE HCL 20 MG PO CPEP: 40.0000 mg | ORAL_CAPSULE | Freq: Every day | ORAL | 1 refills | Status: DC

## 2023-09-23 MED ORDER — CLOBETASOL PROPIONATE 0.05 % EX OINT: 1.0000 | TOPICAL_OINTMENT | Freq: Two times a day (BID) | CUTANEOUS | 0 refills | Status: DC

## 2023-09-23 NOTE — Patient Instructions (Signed)
Use twice daily until resolved

## 2023-09-23 NOTE — Assessment & Plan Note (Signed)
 Chronic pain, exacerbated by severe eczema. Current treatment with Cymbalta  and Lyrica , but patient reports significant fatigue with higher doses. -Continue current regimen of Cymbalta  40mg  twice daily and Lyrica  as tolerated. -Refill Cymbalta  prescription. -Refer to [facility] for further management.

## 2023-09-23 NOTE — Progress Notes (Signed)
 Office Visit  BP (!) 142/75 (BP Location: Left Arm, Patient Position: Sitting, Cuff Size: Large)   Pulse 63   Ht 5' 7 (1.702 m)   Wt 127 lb (57.6 kg)   SpO2 99%   BMI 19.89 kg/m    Subjective:    Patient ID: Amber Livingston, female    DOB: 14-Dec-1960, 63 y.o.   MRN: [id number]  HPI: Amber Livingston is a 63 y.o. female  Chief Complaint  Patient presents with   skin irritation     Bilateral hands, feels as if nerve endings are at the tip of fingers, very painful, peeling    Discussed the use of AI scribe software for clinical note transcription with the patient, who gave verbal consent to proceed.  History of Present Illness   Amber Livingston is a 63 year old female with fibromyalgia who presents with severe hand pain and skin issues exacerbated by cold weather.  She experiences severe pain and skin issues on her hands, particularly her thumbs, which worsen during the winter months. The pain is described as feeling like '30 degree burns' and improves with warmer weather. These symptoms began after [place] and are absent during the summer months. She uses a steroid gel cream prescribed by a dermatologist but is unsure of the dose. She has also tried castor oil without relief. The pain is so severe that it affects her daily activities, causing her to 'scream and hurt' when touched. The skin on her hands bleeds when it breaks open, and she wraps her hands to manage the condition.  She has a history of fibromyalgia, which she believes may be related to her current symptoms. She experiences chronic pain and significant distress, describing a 'complete and absolute, utter mental breakdown'. She uses Motrin  and cannabis for pain relief but finds them insufficient. She has tried Lyrica  but experienced excessive fatigue, and has recently resumed taking Cymbalta , which she finds more effective. She takes 40 mg of Cymbalta  in the morning and another dose later in the  day, avoiding higher doses due to fatigue.  She reports headaches that started three days ago, occurring daily at 4 PM. These headaches are a new symptom and add to her overall pain burden.  She mentions a recent dementia diagnosis, which affects her memory and cognitive function. She has difficulty remembering medication names and managing her health conditions.  She has been involved in physical therapy, which she finds beneficial, but her sessions are about to end. She wants to continue with physical therapy to help manage her symptoms.     Relevant past medical, surgical, family and social history reviewed and updated as indicated. Interim medical history since our last visit reviewed. Allergies and medications reviewed and updated.  ROS per HPI unless specifically indicated above     Objective:    BP (!) 142/75 (BP Location: Left Arm, Patient Position: Sitting, Cuff Size: Large)   Pulse 63   Ht 5' 7 (1.702 m)   Wt 127 lb (57.6 kg)   SpO2 99%   BMI 19.89 kg/m   Wt Readings from Last 3 Encounters:  09/23/23 127 lb (57.6 kg)  09/23/23 125 lb (56.7 kg)  09/16/23 126 lb (57.2 kg)     Physical Exam Constitutional:      Appearance: Normal appearance.  Pulmonary:     Effort: Pulmonary effort is normal.  Musculoskeletal:        General: Normal range of motion.  Skin:    Findings: Rash present.     Comments: See thumb rash image below.  Neurological:     General: No focal deficit present.     Mental Status: She is alert. Mental status is at baseline.  Psychiatric:        Mood and Affect: Mood normal.        Behavior: Behavior normal.        Thought Content: Thought content normal.          08/27/2023   10:59 AM 06/18/2023    2:38 PM 03/07/2023    1:16 PM 12/25/2022    2:52 PM 12/03/2022    9:54 AM  Depression screen PHQ 2/9  Decreased Interest 3 0 2 3 1   Down, Depressed, Hopeless 3 0 1 1 2   PHQ - 2 Score 6 0 3 4 3   Altered sleeping 3 0 1 3 0  Tired, decreased  energy 3 0 3 3 1   Change in appetite 2 0 2 3 1   Feeling bad or failure about yourself  0 0 0 0 0  Trouble concentrating 2 0 1 3 1   Moving slowly or fidgety/restless 3 0 1 0 0  Suicidal thoughts 0 0 0 0 0  PHQ-9 Score 19 0 11 16 6   Difficult doing work/chores Somewhat difficult Not difficult at all Somewhat difficult Extremely dIfficult Extremely dIfficult       08/27/2023   11:02 AM 06/18/2023    2:38 PM 03/07/2023    1:16 PM 12/25/2022    2:52 PM  GAD 7 : Generalized Anxiety Score  Nervous, Anxious, on Edge 3 0 1   Control/stop worrying 3 0 1 0  Worry too much - different things 3 0 1 0  Trouble relaxing 3 0 1 2  Restless 3 0 2 3  Easily annoyed or irritable 3 0 1 2  Afraid - awful might happen 3 0 0 2  Total GAD 7 Score 21 0 7   Anxiety Difficulty Somewhat difficult Not difficult at all Somewhat difficult Extremely difficult       Assessment & Plan:  Assessment & Plan   Severe eczema Assessment & Plan: Worsening in cold weather, causing significant pain and distress. No prior eczema diagnosis but suspect this as etiology at this time. Current treatment with steroid cream not effective. Suspect needs stronger steroid.  -Prescribe clobetasol  for presumed eczema - Consider dermatology evaluation if not improving.  Orders: -     Clobetasol  Propionate; Apply 1 Application topically 2 (two) times daily.  Dispense: 30 g; Refill: 0  Fibromyalgia Assessment & Plan: Chronic pain, exacerbated by severe eczema. Current treatment with Cymbalta  and Lyrica , but patient reports significant fatigue with higher doses. -Continue current regimen of Cymbalta  40mg  twice daily and Lyrica  as tolerated. -Refill Cymbalta  prescription. -Refer to [facility] for further management.  Orders: -     DULoxetine  HCl; Take 2 capsules (40 mg total) by mouth daily.  Dispense: 180 capsule; Refill: 1 -     Ambulatory referral to Physical Therapy -     Ambulatory referral to Rheumatology  Encounter  for behavioral health screening As part of their intake evaluation, the patient was screened for depression, anxiety.  PHQ9 SCORE 19, GAD7 SCORE 21. Screening results positive for tested conditions. Currently on treatment. Declines further adjustments to medications for now due to oversedation. CTM.  Follow up plan: Return in about 6 months (around 03/22/2024).  Brystol Wasilewski SHAUNNA NETT, MD

## 2023-09-23 NOTE — Patient Instructions (Signed)
 Full PFT Performed Today

## 2023-09-23 NOTE — Progress Notes (Signed)
 Full PFT Performed Today

## 2023-09-23 NOTE — Assessment & Plan Note (Addendum)
 Worsening in cold weather, causing significant pain and distress. No prior eczema diagnosis but suspect this as etiology at this time. Current treatment with steroid cream not effective. Suspect needs stronger steroid.  -Prescribe clobetasol  for presumed eczema - Consider dermatology evaluation if not improving.

## 2023-09-30 ENCOUNTER — Ambulatory Visit: Payer: Medicaid Other | Admitting: Physical Therapy

## 2023-10-02 ENCOUNTER — Ambulatory Visit: Payer: Self-pay | Admitting: Neurology

## 2023-10-06 ENCOUNTER — Ambulatory Visit: Payer: Medicaid Other

## 2023-10-07 ENCOUNTER — Encounter (HOSPITAL_BASED_OUTPATIENT_CLINIC_OR_DEPARTMENT_OTHER): Payer: Self-pay | Admitting: Pulmonary Disease

## 2023-10-07 ENCOUNTER — Ambulatory Visit (HOSPITAL_BASED_OUTPATIENT_CLINIC_OR_DEPARTMENT_OTHER): Payer: Medicaid Other | Admitting: Pulmonary Disease

## 2023-10-07 VITALS — BP 120/72 | HR 55 | Ht 67.0 in | Wt 126.8 lb

## 2023-10-07 DIAGNOSIS — G4734 Idiopathic sleep related nonobstructive alveolar hypoventilation: Secondary | ICD-10-CM

## 2023-10-07 DIAGNOSIS — J432 Centrilobular emphysema: Secondary | ICD-10-CM

## 2023-10-07 MED ORDER — ALBUTEROL SULFATE HFA 108 (90 BASE) MCG/ACT IN AERS: 2.0000 | INHALATION_SPRAY | RESPIRATORY_TRACT | 11 refills | Status: DC | PRN
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
## 2023-10-07 NOTE — Patient Instructions (Signed)
Emphysema - well-controlled on LAMA/LABA. Thrush with ICS. Last exacerbation >1 year --CONTINUE Stiolto TWO puffs in the morning.  --CONTINUE Albuterol as needed for shortness of breath or wheezing --CONTINUE nebulizer with meds: Take nightly or as needed up to 4 times a day --Reviewed pulmonary function test. Improved obstruction --Continue physical therapy and regular aerobic activity up to 30 min five days a week  Nocturnal hypoxemia --Continue 1L nightly. Due for ONO renewal before 10/2023 --ORDER overnight oximetry on Room Air

## 2023-10-13 ENCOUNTER — Ambulatory Visit: Payer: Medicaid Other

## 2023-10-14 ENCOUNTER — Encounter: Payer: Medicaid Other | Admitting: Physical Therapy

## 2023-10-20 ENCOUNTER — Other Ambulatory Visit: Payer: Self-pay | Admitting: Pediatrics

## 2023-10-20 DIAGNOSIS — G8929 Other chronic pain: Secondary | ICD-10-CM

## 2023-10-21 NOTE — Telephone Encounter (Signed)
 Requested Prescriptions  Pending Prescriptions Disp Refills   ibuprofen (ADVIL) 600 MG tablet [Pharmacy Med Name: IBUPROFEN 600 MG TABLET] 90 tablet 0    Sig: TAKE 1 TABLET (600 MG TOTAL) BY MOUTH DAILY AS NEEDED     Analgesics:  NSAIDS Failed - 10/21/2023  4:35 PM      Failed - Manual Review: Labs are only required if the patient has taken medication for more than 8 weeks.      Passed - Cr in normal range and within 360 days    Creatinine, Ser  Date Value Ref Range Status  08/27/2023 0.65 0.57 - 1.00 mg/dL Final         Passed - HGB in normal range and within 360 days    Hemoglobin  Date Value Ref Range Status  04/14/2023 13.3 11.1 - 15.9 g/dL Final         Passed - PLT in normal range and within 360 days    Platelets  Date Value Ref Range Status  04/14/2023 328 150 - 450 x10E3/uL Final         Passed - HCT in normal range and within 360 days    Hematocrit  Date Value Ref Range Status  04/14/2023 38.0 34.0 - 46.6 % Final         Passed - eGFR is 30 or above and within 360 days    GFR calc Af Amer  Date Value Ref Range Status  09/27/2020 101 >59 mL/min/1.73 Final    Comment:    **In accordance with recommendations from the NKF-ASN Task force,**   [facility] is in the process of updating its eGFR calculation to the   2021 CKD-EPI creatinine equation that estimates kidney function   without a race variable.    GFR, Estimated  Date Value Ref Range Status  01/20/2023 >60 >60 mL/min Final    Comment:    (NOTE) Calculated using the CKD-EPI Creatinine Equation (2021)    eGFR  Date Value Ref Range Status  08/27/2023 99 >59 mL/min/1.73 Final         Passed - Patient is not pregnant      Passed - Valid encounter within last 12 months    Recent Outpatient Visits           1 month ago Fibromyalgia   [place] [facility] Jackolyn Confer, MD   4 months ago Symptoms involving urinary system   [place] [facility] Jackolyn Confer, MD   4  months ago Primary hypertension   [place] [facility] Jack[facility]yn Confer, MD   5 months ago [name]   [facility] Jack[facility]yn Confer, [facility]   6 months ago Urinary frequency   [place] [facility] Jackolyn Confer, MD       Future Appointments             In 1 month Loleta Chance, MD [facility] at [facility], [facility]   In 5 months Evelene Croon, Atilano Median, MD [facility], [facility]

## 2023-10-22 ENCOUNTER — Ambulatory Visit: Payer: Medicaid Other | Admitting: Pediatrics

## 2023-10-22 NOTE — Telephone Encounter (Signed)
 Copied from CRM [id number]. Topic: General - Inquiry >> Jul 31, 2023  8:05 AM Lennox Pippins wrote: Patient has called and states she can not make visit today, Patient has been rescheduled to 08/05/2023. I have left her appointment since she called after her appt time, just FYI.

## 2023-10-30 ENCOUNTER — Other Ambulatory Visit: Payer: Self-pay | Admitting: Pediatrics

## 2023-10-31 NOTE — Telephone Encounter (Signed)
 Requested Prescriptions  Pending Prescriptions Disp Refills   pantoprazole (PROTONIX) 40 MG tablet [Pharmacy Med Name: PANTOPRAZOLE SOD DR 40 MG TAB] 90 tablet 1    Sig: TAKE 1 TABLET BY MOUTH EVERY DAY AS NEEDED     Gastroenterology: Proton Pump Inhibitors Passed - 10/31/2023 12:34 PM      Passed - Valid encounter within last 12 months    Recent Outpatient Visits           2 months ago Fibromyalgia   [place] [facility] Jackolyn Confer, MD   4 months ago Symptoms involving urinary system   [place] [facility] Jackolyn Confer, MD   5 months ago Primary hypertension   [place] [facility] Jackolyn Confer, MD   6 months ago [name]   [facility]yn Con[facility], MD   6 months ago Urinary frequency   [place] [facility] Jackolyn Confer, MD       Future Appointments             In 3 weeks Loleta Chance, MD [facility] at [facility], [facility]   In 4 months Evelene Croon, Atilano Median, MD [facility], [facility]

## 2023-11-02 ENCOUNTER — Other Ambulatory Visit: Payer: Self-pay | Admitting: Pediatrics

## 2023-11-02 DIAGNOSIS — M797 Fibromyalgia: Secondary | ICD-10-CM

## 2023-11-03 NOTE — Telephone Encounter (Signed)
 Requested medication (s) are due for refill today - yes  Requested medication (s) are on the active medication list -yes  Future visit scheduled -yes  Last refill: 08/27/23 #30 1RF  Notes to clinic: non delegated Rx  Requested Prescriptions  Pending Prescriptions Disp Refills   pregabalin (LYRICA) 25 MG capsule [Pharmacy Med Name: PREGABALIN 25 MG CAPSULE] 30 capsule 1    Sig: TAKE 1 CAPSULE BY MOUTH EVERY DAY     Not Delegated - Neurology:  Anticonvulsants - Controlled - pregabalin Failed - 11/03/2023 10:55 AM      Failed - This refill cannot be delegated      Passed - Cr in normal range and within 360 days    Creatinine, Ser  Date Value Ref Range Status  08/27/2023 0.65 0.57 - 1.00 mg/dL Final         Passed - Completed PHQ-2 or PHQ-9 in the last 360 days      Passed - Valid encounter within last 12 months    Recent Outpatient Visits           2 months ago Fibromyalgia   [place] [facility] Jackolyn Confer, MD   4 months ago Symptoms involving urinary system   [facility] Jackolyn Confer, MD   5 months ago Primary hypertension   [place] [facility] Jackolyn Confer, MD   6 months ago [name]   [facility] Evelene Croon, Atilano Median, MD   6 months ago Urinary frequency   [facility] Jackolyn Confer, MD       Future Appointments             In 3 weeks Loleta Chance, MD [facility] at [facility], [facility]   In 4 months Evelene Croon, Atilano Median, MD [facility], [facility]               Requested Prescriptions  Pending Prescriptions Disp Refills   pregabalin (LYRICA) 25 MG capsule [Pharmacy Med Name: PREGABALIN 25 MG CAPSULE] 30 capsule 1    Sig: TAKE 1 CAPSULE BY MOUTH EVERY DAY     Not Delegated - Neurology:  Anticonvulsants - Controlled - pregabalin Failed - 11/03/2023 10:55 AM      Failed - This refill cannot be delegated      Passed  - Cr in normal range and within 360 days    Creatinine, Ser  Date Value Ref Range Status  08/27/2023 0.65 0.57 - 1.00 mg/dL Final         Passed - Completed PHQ-2 or PHQ-9 in the last 360 days      Passed - Valid encounter within last 12 months    Recent Outpatient Visits           2 months ago Fibromyalgia   [place] [facility] Jackolyn Confer, MD   4 months ago Symptoms involving urinary system   [place] [facility] Jack[facility]yn Confer, MD   5 months ago Primary hypertension   [place] [facility] Jackolyn Confer, MD   6 months ago [facility] Jack[facility]yn Confer, MD   6 months ago Urinary frequency   [place] [facility] Jackolyn Confer, MD       Future Appointments             In 3 weeks  Loleta Chance, MD [facility] at [facility], [facility]   In 4 months Evelene Croon, Atilano Median, MD [facility], [facility]

## 2023-11-04 ENCOUNTER — Telehealth (HOSPITAL_BASED_OUTPATIENT_CLINIC_OR_DEPARTMENT_OTHER): Payer: Self-pay | Admitting: Pulmonary Disease

## 2023-11-04 DIAGNOSIS — G4734 Idiopathic sleep related nonobstructive alveolar hypoventilation: Secondary | ICD-10-CM

## 2023-11-04 NOTE — Telephone Encounter (Signed)
 Overnight Oximetry Results Date: 10/20/23 SpO2 <88% 0 hour 0 min 0 sec.  [place] SpO2 94%  Not qualified for oxygen  Assessment/Plan Nocturnal Hypoxemia - RESOLVED --CANCEL oxygen and send orders to DME to pick up equipment --Patient updated and aware of equipment pick-up

## 2023-11-04 NOTE — Telephone Encounter (Signed)
Order sent to DME

## 2023-11-05 ENCOUNTER — Other Ambulatory Visit: Payer: Self-pay | Admitting: Internal Medicine

## 2023-11-05 DIAGNOSIS — I700 Atherosclerosis of aorta: Secondary | ICD-10-CM

## 2023-11-05 DIAGNOSIS — E782 Mixed hyperlipidemia: Secondary | ICD-10-CM

## 2023-11-10 ENCOUNTER — Ambulatory Visit: Attending: Pediatrics | Admitting: Physical Therapy

## 2023-11-10 ENCOUNTER — Encounter: Payer: Self-pay | Admitting: Physical Therapy

## 2023-11-10 DIAGNOSIS — M25511 Pain in right shoulder: Secondary | ICD-10-CM | POA: Diagnosis present

## 2023-11-10 DIAGNOSIS — M6281 Muscle weakness (generalized): Secondary | ICD-10-CM | POA: Diagnosis present

## 2023-11-10 DIAGNOSIS — M797 Fibromyalgia: Secondary | ICD-10-CM | POA: Diagnosis present

## 2023-11-10 DIAGNOSIS — G8929 Other chronic pain: Secondary | ICD-10-CM | POA: Insufficient documentation

## 2023-11-10 DIAGNOSIS — M542 Cervicalgia: Secondary | ICD-10-CM | POA: Insufficient documentation
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
## 2023-11-19 ENCOUNTER — Ambulatory Visit: Attending: Pediatrics | Admitting: Physical Therapy

## 2023-11-19 ENCOUNTER — Encounter: Payer: Self-pay | Admitting: Pulmonary Disease

## 2023-11-19 DIAGNOSIS — M797 Fibromyalgia: Secondary | ICD-10-CM | POA: Diagnosis present

## 2023-11-19 DIAGNOSIS — M6281 Muscle weakness (generalized): Secondary | ICD-10-CM | POA: Insufficient documentation

## 2023-11-19 DIAGNOSIS — G8929 Other chronic pain: Secondary | ICD-10-CM | POA: Insufficient documentation

## 2023-11-19 DIAGNOSIS — M25511 Pain in right shoulder: Secondary | ICD-10-CM | POA: Diagnosis present

## 2023-11-19 DIAGNOSIS — M542 Cervicalgia: Secondary | ICD-10-CM | POA: Diagnosis present
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
## 2023-11-20 ENCOUNTER — Encounter: Payer: Self-pay | Admitting: Physical Therapy

## 2023-11-24 ENCOUNTER — Ambulatory Visit: Payer: Medicaid Other | Admitting: Neurology

## 2023-11-25 ENCOUNTER — Ambulatory Visit: Admitting: Physical Therapy

## 2023-11-25 DIAGNOSIS — G8929 Other chronic pain: Secondary | ICD-10-CM

## 2023-11-25 DIAGNOSIS — M6281 Muscle weakness (generalized): Secondary | ICD-10-CM

## 2023-11-25 DIAGNOSIS — M797 Fibromyalgia: Secondary | ICD-10-CM

## 2023-11-25 DIAGNOSIS — M542 Cervicalgia: Secondary | ICD-10-CM
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
## 2023-11-26 ENCOUNTER — Encounter: Payer: Self-pay | Admitting: Physical Therapy

## 2023-11-26 ENCOUNTER — Ambulatory Visit: Payer: Medicaid Other | Admitting: Obstetrics

## 2023-11-26 ENCOUNTER — Encounter: Payer: Self-pay | Admitting: Obstetrics

## 2023-11-26 VITALS — BP 139/58 | HR 54

## 2023-11-26 DIAGNOSIS — R350 Frequency of micturition: Secondary | ICD-10-CM | POA: Diagnosis not present

## 2023-11-26 DIAGNOSIS — N952 Postmenopausal atrophic vaginitis: Secondary | ICD-10-CM | POA: Diagnosis not present

## 2023-11-26 DIAGNOSIS — N39492 Postural (urinary) incontinence: Secondary | ICD-10-CM

## 2023-11-26 MED ORDER — ESTROGENS CONJUGATED 0.625 MG/GM VA CREA: TOPICAL_CREAM | VAGINAL | 3 refills | Status: DC

## 2023-11-26 NOTE — Assessment & Plan Note (Signed)
-   resolved since fluid management -encouraged pelvic relaxation during voids - discussed fluid management, caffeine reduction, reassess symptoms at follow-up - encouraged to consider pelvic floor PT if symptoms return

## 2023-11-26 NOTE — Assessment & Plan Note (Signed)
-   using 0.5mg  estradiol PO for HRT - atrophic vaginal epithelium with urethral discomfort. No skin changes, lesion, with normal contour of urethra and urethral meatus on exam - For symptomatic vaginal atrophy options include lubrication with a water-based lubricant, personal hygiene measures and barrier protection against wetness, and estrogen replacement in the form of vaginal cream, vaginal tablets, or a time-released vaginal ring.   - Refill for low dose vaginal estrogen maintenance dose 0.5g twice a week - encouraged barrier ointment use and avoid wipe use postvoid - reassured pt that she can resume intercourse if desired

## 2023-11-26 NOTE — Assessment & Plan Note (Signed)
-   resolved since fluid management, now 64oz/day - previously drinks 120oz water, 3-4 cups of coffee, smoothies - prior POCT UA negative, bladder scan 16mL - previously recommended fluid restriction by nephrology with hospital admission for hyponatremia in 2022 - We discussed the symptoms of overactive bladder (OAB), which include urinary urgency, urinary frequency, nocturia, with or without urge incontinence.  While we do not know the exact etiology of OAB, several treatment options exist. We discussed management including behavioral therapy (decreasing bladder irritants, urge suppression strategies, timed voids, bladder retraining), physical therapy, medication; for refractory cases posterior tibial nerve stimulation, sacral neuromodulation, and intravesical botulinum toxin injection.  For anticholinergic medications, we discussed the potential side effects of anticholinergics including dry eyes, dry mouth, constipation, cognitive impairment and urinary retention. Will avoid due to concerns with memory problems For Beta-3 agonist medication, we discussed the potential side effect of elevated blood pressure which is more likely to occur in individuals with uncontrolled hypertension. - reviewed and provided handout for fluid management and bladder training, caffeine reduction - encouraged to consider pelvic floor PT since pt responded well to PT for cervical pain and fibromyalgia with dry needling if symptoms return - pt desires to avoid medication
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
## 2023-11-26 NOTE — Patient Instructions (Signed)
 Good work!  Continues vaginal estrogen 1g twice a week.   Continue to manage your fluid intake.

## 2023-11-27 ENCOUNTER — Ambulatory Visit: Admitting: Physical Therapy

## 2023-11-27 DIAGNOSIS — M797 Fibromyalgia: Secondary | ICD-10-CM

## 2023-11-27 DIAGNOSIS — M542 Cervicalgia: Secondary | ICD-10-CM | POA: Diagnosis not present

## 2023-11-27 DIAGNOSIS — M6281 Muscle weakness (generalized): Secondary | ICD-10-CM

## 2023-11-27 DIAGNOSIS — G8929 Other chronic pain: Secondary | ICD-10-CM
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
## 2023-12-02 ENCOUNTER — Ambulatory Visit: Admitting: Physical Therapy

## 2023-12-02 ENCOUNTER — Encounter: Payer: Self-pay | Admitting: Physical Therapy

## 2023-12-02 DIAGNOSIS — M542 Cervicalgia: Secondary | ICD-10-CM

## 2023-12-02 DIAGNOSIS — G8929 Other chronic pain: Secondary | ICD-10-CM

## 2023-12-02 DIAGNOSIS — M6281 Muscle weakness (generalized): Secondary | ICD-10-CM

## 2023-12-02 DIAGNOSIS — M797 Fibromyalgia: Secondary | ICD-10-CM
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
## 2023-12-04 ENCOUNTER — Ambulatory Visit: Admitting: Physical Therapy

## 2023-12-04 ENCOUNTER — Encounter: Payer: Self-pay | Admitting: Physical Therapy

## 2023-12-04 DIAGNOSIS — M6281 Muscle weakness (generalized): Secondary | ICD-10-CM

## 2023-12-04 DIAGNOSIS — M542 Cervicalgia: Secondary | ICD-10-CM

## 2023-12-04 DIAGNOSIS — G8929 Other chronic pain: Secondary | ICD-10-CM

## 2023-12-04 DIAGNOSIS — M797 Fibromyalgia: Secondary | ICD-10-CM
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
## 2023-12-08 ENCOUNTER — Other Ambulatory Visit: Payer: Self-pay | Admitting: Internal Medicine

## 2023-12-09 ENCOUNTER — Ambulatory Visit: Admitting: Physical Therapy

## 2023-12-09 DIAGNOSIS — M542 Cervicalgia: Secondary | ICD-10-CM

## 2023-12-09 DIAGNOSIS — G8929 Other chronic pain: Secondary | ICD-10-CM

## 2023-12-09 DIAGNOSIS — M797 Fibromyalgia: Secondary | ICD-10-CM

## 2023-12-09 DIAGNOSIS — M6281 Muscle weakness (generalized): Secondary | ICD-10-CM
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
## 2023-12-11 ENCOUNTER — Ambulatory Visit: Admitting: Physical Therapy

## 2023-12-11 DIAGNOSIS — M542 Cervicalgia: Secondary | ICD-10-CM | POA: Diagnosis not present

## 2023-12-11 DIAGNOSIS — G8929 Other chronic pain: Secondary | ICD-10-CM

## 2023-12-11 DIAGNOSIS — M6281 Muscle weakness (generalized): Secondary | ICD-10-CM

## 2023-12-11 DIAGNOSIS — M797 Fibromyalgia: Secondary | ICD-10-CM
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
## 2023-12-16 ENCOUNTER — Ambulatory Visit: Admitting: Physical Therapy

## 2023-12-16 ENCOUNTER — Encounter: Payer: Self-pay | Admitting: Physical Therapy

## 2023-12-16 DIAGNOSIS — M6281 Muscle weakness (generalized): Secondary | ICD-10-CM

## 2023-12-16 DIAGNOSIS — M542 Cervicalgia: Secondary | ICD-10-CM | POA: Diagnosis not present

## 2023-12-16 DIAGNOSIS — G8929 Other chronic pain: Secondary | ICD-10-CM

## 2023-12-16 DIAGNOSIS — M797 Fibromyalgia: Secondary | ICD-10-CM
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
## 2023-12-18 ENCOUNTER — Encounter: Payer: Self-pay | Admitting: Physical Therapy

## 2023-12-18 ENCOUNTER — Ambulatory Visit: Attending: Pediatrics | Admitting: Physical Therapy

## 2023-12-18 DIAGNOSIS — M797 Fibromyalgia: Secondary | ICD-10-CM | POA: Diagnosis present

## 2023-12-18 DIAGNOSIS — G8929 Other chronic pain: Secondary | ICD-10-CM

## 2023-12-18 DIAGNOSIS — M542 Cervicalgia: Secondary | ICD-10-CM

## 2023-12-18 DIAGNOSIS — M6281 Muscle weakness (generalized): Secondary | ICD-10-CM

## 2023-12-18 DIAGNOSIS — M25511 Pain in right shoulder: Secondary | ICD-10-CM | POA: Diagnosis present
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
## 2023-12-23 ENCOUNTER — Ambulatory Visit: Admitting: Physical Therapy

## 2023-12-23 DIAGNOSIS — M6281 Muscle weakness (generalized): Secondary | ICD-10-CM

## 2023-12-23 DIAGNOSIS — M542 Cervicalgia: Secondary | ICD-10-CM

## 2023-12-23 DIAGNOSIS — M797 Fibromyalgia: Secondary | ICD-10-CM

## 2023-12-23 DIAGNOSIS — G8929 Other chronic pain: Secondary | ICD-10-CM

## 2023-12-25 ENCOUNTER — Encounter: Admitting: Physical Therapy

## 2023-12-26 ENCOUNTER — Other Ambulatory Visit: Payer: Self-pay | Admitting: Physician Assistant

## 2023-12-30 ENCOUNTER — Encounter: Admitting: Physical Therapy

## 2024-01-01 ENCOUNTER — Ambulatory Visit: Payer: Self-pay

## 2024-01-01 ENCOUNTER — Ambulatory Visit: Admitting: Physical Therapy

## 2024-01-01 NOTE — Telephone Encounter (Signed)
  Chief Complaint: Skin Problem Symptoms: areas of redness that itchy and bleeding to bilateral legs Frequency: unknown amount of time Pertinent Negatives: Patient denies refused to give further information Disposition: [] ED /[] Urgent Care (no appt availability in office) / [] Appointment(In office/virtual)/ []  [facility]/ [] Home Care/ [] Refused Recommended Disposition /[]  Mobile Bus/ [x]  Follow-up with PCP Additional Notes: patient calling with continued concerns for areas to bilateral legs that are red. Complaints of itching and bleeding to areas. Patient unable to give any further information. Patient is recommended to be seen per protocol within 24 hours. No appointments available with PCP and patient refuses to be seen by any other provider. Patient is needing a phone call for an appointment.    Copied from CRM [id number]. Topic: Clinical - Red Word Triage >> Jan 01, 2024 11:18 AM Hamp Levine R wrote: [facility] that prompted transfer to Nurse Triage: Swollen knot in her leg. States her legs have little ugly circle in big patches, that itch and then bleed when scratched. Has been going on for over a month. Reason for Disposition  [1] Unexplained sores AND [2] 3 or more  Answer Assessment - Initial Assessment Questions 1. APPEARANCE of SORES: "What do the sores look like?"     Several area of reddened areas that are itching and bleed 2. NUMBER: "How many sores are there?"     Refused to say how many 3. SIZE: "How big is the largest sore?"     Refused to give information 4. LOCATION: "Where are the sores located?"     Bilateral legs 5. ONSET: "When did the sores begin?"     "Been going on for awhile" 6. TENDER: "Does it hurt when you touch it?"  (Scale 1-10; or mild, moderate, severe)      Refused to say 7. CAUSE: "What do you think is causing the sores?"     Unsure.  8. OTHER SYMPTOMS: "Do you have any other symptoms?" (e.g., fever, new weakness)     Itching and  causing bleeding  Protocols used: Sores-A-AH

## 2024-01-02 ENCOUNTER — Ambulatory Visit: Admitting: Pediatrics

## 2024-01-02 ENCOUNTER — Encounter: Payer: Self-pay | Admitting: Pediatrics

## 2024-01-02 VITALS — BP 115/75 | HR 71 | Temp 97.7°F | Wt 124.2 lb

## 2024-01-02 DIAGNOSIS — R21 Rash and other nonspecific skin eruption: Secondary | ICD-10-CM | POA: Diagnosis not present

## 2024-01-02 DIAGNOSIS — L299 Pruritus, unspecified: Secondary | ICD-10-CM | POA: Diagnosis not present

## 2024-01-02 DIAGNOSIS — L309 Dermatitis, unspecified: Secondary | ICD-10-CM | POA: Diagnosis not present

## 2024-01-02 MED ORDER — HYDROXYZINE HCL 10 MG PO TABS: 5.0000 mg | ORAL_TABLET | Freq: Three times a day (TID) | ORAL | 0 refills | Status: DC | PRN

## 2024-01-02 NOTE — Telephone Encounter (Signed)
Pt has appointment in office today.

## 2024-01-02 NOTE — Patient Instructions (Addendum)
 Helpful things for itching: Hydroxyzine  5-10 as needed for itchiness  Over the counter EMLA cream

## 2024-01-02 NOTE — Progress Notes (Signed)
 Office Visit  BP 115/75   Pulse 71   Temp 97.7 F (36.5 C) (Oral)   Wt 124 lb 3.2 oz (56.3 kg)   SpO2 99%   BMI 19.45 kg/m    Subjective:    Patient ID: Amber Livingston, female    DOB: [birth year], 63 y.o.   MRN: [id number]  HPI: Amber Livingston is a 63 y.o. female  Chief Complaint  Patient presents with   Rash    Discussed the use of AI scribe software for clinical note transcription with the patient, who gave verbal consent to proceed.  History of Present Illness   Amber Livingston is a 63 year old female who presents with a persistent rash and itching.  She has been experiencing a persistent rash and itching since May 9th. The rash initially appeared on her finger and has since spread, causing significant discomfort. It began resembling insect bites with a sensation of venom and poison, and took five weeks for a small hole to heal. The rash has been severe enough to cause bleeding, necessitating the use of Band-Aids. She has tried clobetasol  cream in two different strengths without relief.  She associates the onset of her symptoms with a severe pollen phase, which she had never experienced before. During this time, she experienced intense itching, loss of voice, and throat discomfort. She had been taking Tylenol  PM, which contains diphenhydramine, to help with sleep, but stopped due to concerns about its potential link to dementia in older women. After discontinuing Tylenol  PM, her itching worsened. She resumed taking it at night, which provided some relief from the itching.  She also reports a new symptom of a white streak across her upper lip, which she describes as a discoloration that does not go away. No use of bleaching products.  She has a history of emphysema, previously diagnosed at a moderate level. She has been engaging in self-help and internal work, which she believes has contributed to an improvement in her lung condition. Her lung specialist informed her that her lung condition  has improved from moderate to mild, and she feels she is breathing better.  She has a history of uterine prolapse and has been using vaginal cream and pills for treatment. She no longer experiences pressure symptoms and feels that the treatment is effective.        Relevant past medical, surgical, family and social history reviewed and updated as indicated. Interim medical history since our last visit reviewed. Allergies and medications reviewed and updated.  ROS per HPI unless specifically indicated above     Objective:     BP 115/75   Pulse 71   Temp 97.7 F (36.5 C) (Oral)   Wt 124 lb 3.2 oz (56.3 kg)   SpO2 99%   BMI 19.45 kg/m   Wt Readings from Last 3 Encounters:  01/02/24 124 lb 3.2 oz (56.3 kg)  10/07/23 126 lb 12.8 oz (57.5 kg)  09/23/23 127 lb (57.6 kg)     Physical Exam Constitutional:      Appearance: Normal appearance.  Pulmonary:     Effort: Pulmonary effort is normal.  Musculoskeletal:        General: Normal range of motion.  Skin:    Findings: Bruising present.     Comments: No breakout seen but some bruising and hypopigmented patches in areas she points to as itchy  Neurological:     General: No focal deficit present.     Mental Status: She is alert. Mental status  is at baseline.  Psychiatric:        Mood and Affect: Mood normal.        Behavior: Behavior normal.        Thought Content: Thought content normal.         08/27/2023   10:59 AM 06/18/2023    2:38 PM 03/07/2023    1:16 PM 12/25/2022    2:52 PM 12/03/2022    9:54 AM  Depression screen PHQ 2/9  Decreased Interest 3 0 2 3 1   Down, Depressed, Hopeless 3 0 1 1 2   PHQ - 2 Score 6 0 3 4 3   Altered sleeping 3 0 1 3 0  Tired, decreased energy 3 0 3 3 1   Change in appetite 2 0 2 3 1   Feeling bad or failure about yourself  0 0 0 0 0  Trouble concentrating 2 0 1 3 1   Moving slowly or fidgety/restless 3 0 1 0 0  Suicidal thoughts 0 0 0 0 0  PHQ-9 Score 19 0 11 16 6   Difficult doing  work/chores Somewhat difficult Not difficult at all Somewhat difficult Extremely dIfficult Extremely dIfficult       08/27/2023   11:02 AM 06/18/2023    2:38 PM 03/07/2023    1:16 PM 12/25/2022    2:52 PM  GAD 7 : Generalized Anxiety Score  Nervous, Anxious, on Edge 3 0 1   Control/stop worrying 3 0 1 0  Worry too Livingston - different things 3 0 1 0  Trouble relaxing 3 0 1 2  Restless 3 0 2 3  Easily annoyed or irritable 3 0 1 2  Afraid - awful might happen 3 0 0 2  Total GAD 7 Score 21 0 7   Anxiety Difficulty Somewhat difficult Not difficult at all Somewhat difficult Extremely difficult       Assessment & Plan:  Assessment & Plan   Severe eczema Itching Rash Persistent pruritus and rash on hands, unclear etiology but previously was concerned severe eczema with significant skin breaks. Previous treatments ineffective w clobetasol . Plan on dermatology referral due to persistent symptoms and unclear diagnosis. Hydroxyzine  proposed due to dementia risk with diphenhydramine. - Prescribe hydroxyzine  for pruritus. - Refer to dermatology for further evaluation. - Recommend over-the-counter sarna cream for symptomatic relief.  -     Ambulatory referral to Dermatology -     hydrOXYzine  HCl; Take 0.5-1 tablets (5-10 mg total) by mouth 3 (three) times daily as needed for itching.  Dispense: 30 tablet; Refill: 0 -     Ambulatory referral to Dermatology   Follow up plan: Return if symptoms worsen or fail to improve.  [name], MD
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
## 2024-01-06 ENCOUNTER — Encounter: Payer: Self-pay | Admitting: Physical Therapy

## 2024-01-06 ENCOUNTER — Ambulatory Visit: Admitting: Physical Therapy

## 2024-01-06 DIAGNOSIS — M6281 Muscle weakness (generalized): Secondary | ICD-10-CM

## 2024-01-06 DIAGNOSIS — M542 Cervicalgia: Secondary | ICD-10-CM

## 2024-01-06 DIAGNOSIS — G8929 Other chronic pain: Secondary | ICD-10-CM

## 2024-01-06 DIAGNOSIS — M797 Fibromyalgia: Secondary | ICD-10-CM

## 2024-01-08 ENCOUNTER — Ambulatory Visit: Admitting: Physical Therapy

## 2024-01-09 ENCOUNTER — Ambulatory Visit

## 2024-01-09 DIAGNOSIS — M542 Cervicalgia: Secondary | ICD-10-CM

## 2024-01-09 DIAGNOSIS — G8929 Other chronic pain: Secondary | ICD-10-CM

## 2024-01-09 DIAGNOSIS — M6281 Muscle weakness (generalized): Secondary | ICD-10-CM
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
## 2024-01-13 ENCOUNTER — Other Ambulatory Visit: Payer: Self-pay | Admitting: Pediatrics

## 2024-01-13 DIAGNOSIS — G8929 Other chronic pain: Secondary | ICD-10-CM

## 2024-01-15 ENCOUNTER — Ambulatory Visit: Admitting: Physical Therapy

## 2024-01-15 ENCOUNTER — Encounter: Payer: Self-pay | Admitting: Physical Therapy

## 2024-01-15 ENCOUNTER — Other Ambulatory Visit: Payer: Self-pay | Admitting: Pediatrics

## 2024-01-15 DIAGNOSIS — G8929 Other chronic pain: Secondary | ICD-10-CM

## 2024-01-15 DIAGNOSIS — M6281 Muscle weakness (generalized): Secondary | ICD-10-CM

## 2024-01-15 DIAGNOSIS — M542 Cervicalgia: Secondary | ICD-10-CM

## 2024-01-15 DIAGNOSIS — M797 Fibromyalgia: Secondary | ICD-10-CM

## 2024-01-15 NOTE — Telephone Encounter (Signed)
 Requested Prescriptions  Pending Prescriptions Disp Refills   ibuprofen  (ADVIL ) 600 MG tablet [Pharmacy Med Name: IBUPROFEN  600 MG TABLET] 90 tablet 0    Sig: TAKE 1 TABLET (600 MG TOTAL) BY MOUTH DAILY AS NEEDED     Analgesics:  NSAIDS Failed - 01/15/2024  5:28 PM      Failed - Manual Review: Labs are only required if the patient has taken medication for more than 8 weeks.      Passed - Cr in normal range and within 360 days    Creatinine, Ser  Date Value Ref Range Status  08/27/2023 0.65 0.57 - 1.00 mg/dL Final         Passed - HGB in normal range and within 360 days    Hemoglobin  Date Value Ref Range Status  04/14/2023 13.3 11.1 - 15.9 g/dL Final         Passed - PLT in normal range and within 360 days    Platelets  Date Value Ref Range Status  04/14/2023 328 150 - 450 x10E3/uL Final         Passed - HCT in normal range and within 360 days    Hematocrit  Date Value Ref Range Status  04/14/2023 38.0 34.0 - 46.6 % Final         Passed - eGFR is 30 or above and within 360 days    GFR calc Af Amer  Date Value Ref Range Status  09/27/2020 101 >59 mL/min/1.73 Final    Comment:    **In accordance with recommendations from the NKF-ASN Task force,**   [facility] is in the process of updating its eGFR calculation to the   2021 CKD-EPI creatinine equation that estimates kidney function   without a race variable.    GFR, Estimated  Date Value Ref Range Status  01/20/2023 >60 >60 mL/min Final    Comment:    (NOTE) Calculated using the CKD-EPI Creatinine Equation (2021)    eGFR  Date Value Ref Range Status  08/27/2023 99 >59 mL/min/1.73 Final         Passed - Patient is not pregnant      Passed - Valid encounter within last 12 months    Recent Outpatient Visits           1 week ago Severe eczema   [facility] [name], MD   3 months ago Severe eczema   [place] [facility] [name], MD       Future  Appointments             In 2 months Juliette Oh, Stephannie Ehlers, MD [facility]
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
## 2024-01-15 NOTE — Telephone Encounter (Signed)
 Copied from CRM [id number]. Topic: Clinical - Medication Refill >> Jan 15, 2024  2:48 PM Ivette P wrote: Medication: ibuprofen  (ADVIL ) 600 MG tablet  Has the patient contacted their pharmacy? Yes (Agent: If no, request that the patient contact the pharmacy for the refill. If patient does not wish to contact the pharmacy document the reason why and proceed with request.) (Agent: If yes, when and what did the pharmacy advise?)  This is the patient's preferred pharmacy:  [facility] [phone] [place], [place] - [address] [place] [address] Phone: [phone] Fax: [phone]  Is this the correct pharmacy for this prescription? Yes If no, delete pharmacy and type the correct one.   Has the prescription been filled recently? No, 10/21/2023  Is the patient out of the medication? Yes  Has the patient been seen for an appointment in the last year OR does the patient have an upcoming appointment? Yes, 01/02/2024  Can we respond through MyChart? Yes  Agent: Please be advised that Rx refills may take up to 3 business days. We ask that you follow-up with your pharmacy.

## 2024-01-16 ENCOUNTER — Other Ambulatory Visit: Payer: Self-pay | Admitting: Pediatrics

## 2024-01-16 DIAGNOSIS — M797 Fibromyalgia: Secondary | ICD-10-CM

## 2024-01-17 NOTE — Telephone Encounter (Signed)
 ibuprofen  (ADVIL ) 600 MG tablet 90 tablet 0 01/15/2024 --   Sig - Route: TAKE 1 TABLET (600 MG TOTAL) BY MOUTH DAILY AS NEEDED - Oral   Sent to pharmacy as: ibuprofen  (ADVIL ) 600 MG tablet   Notes to Pharmacy: DX Code Needed  REFILL.   E-Prescribing Status: Receipt confirmed by pharmacy (01/15/2024  5:29 PM EDT)    Requested Prescriptions  Pending Prescriptions Disp Refills   ibuprofen  (ADVIL ) 600 MG tablet 90 tablet 0    Sig: Take 1 tablet (600 mg total) by mouth daily as needed.     Analgesics:  NSAIDS Failed - 01/17/2024  2:04 PM      Failed - Manual Review: Labs are only required if the patient has taken medication for more than 8 weeks.      Passed - Cr in normal range and within 360 days    Creatinine, Ser  Date Value Ref Range Status  08/27/2023 0.65 0.57 - 1.00 mg/dL Final         Passed - HGB in normal range and within 360 days    Hemoglobin  Date Value Ref Range Status  04/14/2023 13.3 11.1 - 15.9 g/dL Final         Passed - PLT in normal range and within 360 days    Platelets  Date Value Ref Range Status  04/14/2023 328 150 - 450 x10E3/uL Final         Passed - HCT in normal range and within 360 days    Hematocrit  Date Value Ref Range Status  04/14/2023 38.0 34.0 - 46.6 % Final         Passed - eGFR is 30 or above and within 360 days    GFR calc Af Amer  Date Value Ref Range Status  09/27/2020 101 >59 mL/min/1.73 Final    Comment:    **In accordance with recommendations from the NKF-ASN Task force,**   [facility] is in the process of updating its eGFR calculation to the   2021 CKD-EPI creatinine equation that estimates kidney function   without a race variable.    GFR, Estimated  Date Value Ref Range Status  01/20/2023 >60 >60 mL/min Final    Comment:    (NOTE) Calculated using the CKD-EPI Creatinine Equation (2021)    eGFR  Date Value Ref Range Status  08/27/2023 99 >59 mL/min/1.73 Final         Passed - Patient is not pregnant      Passed -  Valid encounter within last 12 months    Recent Outpatient Visits           2 weeks ago Severe eczema   [facility] [name], MD   3 months ago Severe eczema   [place] [facility] [name], MD       Future Appointments             In 2 months Juliette Oh, Stephannie Ehlers, MD [facility], [facility]

## 2024-01-19 NOTE — Telephone Encounter (Signed)
 Requested medication (s) are due for refill today - yes  Requested medication (s) are on the active medication list -yes  Future visit scheduled -yes  Last refill: 11/04/23 #30 1RF  Notes to clinic: non delegated- provider review   Requested Prescriptions  Pending Prescriptions Disp Refills   pregabalin  (LYRICA ) 25 MG capsule [Pharmacy Med Name: PREGABALIN  25 MG CAPSULE] 30 capsule 1    Sig: TAKE 1 CAPSULE BY MOUTH EVERY DAY     Not Delegated - Neurology:  Anticonvulsants - Controlled - pregabalin  Failed - 01/19/2024 10:00 AM      Failed - This refill cannot be delegated      Passed - Cr in normal range and within 360 days    Creatinine, Ser  Date Value Ref Range Status  08/27/2023 0.65 0.57 - 1.00 mg/dL Final         Passed - Completed PHQ-2 or PHQ-9 in the last 360 days      Passed - Valid encounter within last 12 months    Recent Outpatient Visits           2 weeks ago Severe eczema   [place] [facility] [name], MD   3 months ago Severe eczema   [place] [facility] [name], MD       Future Appointments             In 2 months Juliette Oh, Stephannie Ehlers, MD [facility], [facility]               Requested Prescriptions  Pending Prescriptions Disp Refills   pregabalin  (LYRICA ) 25 MG capsule [Pharmacy Med Name: PREGABALIN  25 MG CAPSULE] 30 capsule 1    Sig: TAKE 1 CAPSULE BY MOUTH EVERY DAY     Not Delegated - Neurology:  Anticonvulsants - Controlled - pregabalin  Failed - 01/19/2024 10:00 AM      Failed - This refill cannot be delegated      Passed - Cr in normal range and within 360 days    Creatinine, Ser  Date Value Ref Range Status  08/27/2023 0.65 0.57 - 1.00 mg/dL Final         Passed - Completed PHQ-2 or PHQ-9 in the last 360 days      Passed - Valid encounter within last 12 months    Recent Outpatient Visits           2 weeks ago Severe eczema   [facility]  [name], MD   3 months ago Severe eczema   [place] [facility] [name], MD       Future Appointments             In 2 months Juliette Oh, Stephannie Ehlers, MD  [facility], [facility]
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
## 2024-01-22 ENCOUNTER — Ambulatory Visit: Attending: Pediatrics | Admitting: Physical Therapy

## 2024-01-22 DIAGNOSIS — M797 Fibromyalgia: Secondary | ICD-10-CM

## 2024-01-22 DIAGNOSIS — M6281 Muscle weakness (generalized): Secondary | ICD-10-CM

## 2024-01-22 DIAGNOSIS — M542 Cervicalgia: Secondary | ICD-10-CM

## 2024-01-22 DIAGNOSIS — M25511 Pain in right shoulder: Secondary | ICD-10-CM | POA: Diagnosis present

## 2024-01-22 DIAGNOSIS — G8929 Other chronic pain: Secondary | ICD-10-CM

## 2024-01-26 ENCOUNTER — Ambulatory Visit: Admitting: Physical Therapy

## 2024-01-26 DIAGNOSIS — M6281 Muscle weakness (generalized): Secondary | ICD-10-CM

## 2024-01-26 DIAGNOSIS — M542 Cervicalgia: Secondary | ICD-10-CM | POA: Diagnosis not present

## 2024-01-26 DIAGNOSIS — G8929 Other chronic pain: Secondary | ICD-10-CM

## 2024-01-26 DIAGNOSIS — M797 Fibromyalgia: Secondary | ICD-10-CM
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
## 2024-01-27 ENCOUNTER — Ambulatory Visit: Payer: Medicaid Other | Admitting: Adult Health

## 2024-01-27 ENCOUNTER — Encounter: Payer: Self-pay | Admitting: Physical Therapy

## 2024-01-28 ENCOUNTER — Ambulatory Visit: Admitting: Physical Therapy

## 2024-01-28 DIAGNOSIS — M797 Fibromyalgia: Secondary | ICD-10-CM

## 2024-01-28 DIAGNOSIS — M6281 Muscle weakness (generalized): Secondary | ICD-10-CM

## 2024-01-28 DIAGNOSIS — M542 Cervicalgia: Secondary | ICD-10-CM

## 2024-01-28 DIAGNOSIS — G8929 Other chronic pain: Secondary | ICD-10-CM
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
## 2024-01-29 ENCOUNTER — Encounter: Payer: Self-pay | Admitting: Physical Therapy

## 2024-02-02 ENCOUNTER — Ambulatory Visit: Admitting: Physical Therapy

## 2024-02-03 ENCOUNTER — Telehealth: Payer: Self-pay | Admitting: Pharmacy Technician

## 2024-02-03 ENCOUNTER — Other Ambulatory Visit (HOSPITAL_COMMUNITY): Payer: Self-pay

## 2024-02-03 NOTE — Telephone Encounter (Signed)
 Pharmacy Patient Advocate Encounter   Received notification from Fax that prior authorization for Repatha  is required/requested.   Insurance verification completed.   The patient is insured through [facility] .   Per test claim: PA required; PA submitted to above mentioned insurance via CoverMyMeds Key/confirmation #/EOC [id number] Status is pending

## 2024-02-03 NOTE — Telephone Encounter (Signed)
 Pharmacy Patient Advocate Encounter  Received notification from [facility] that Prior Authorization for Repatha  has been APPROVED from 02/03/24 to 02/02/25. Unable to obtain price due to refill too soon rejection, last fill date 01/13/24 next available fill date07/29/25   PA #/Case ID/Reference #: [id number]

## 2024-02-04 ENCOUNTER — Ambulatory Visit: Admitting: Physical Therapy

## 2024-02-09 ENCOUNTER — Encounter: Admitting: Physical Therapy

## 2024-02-11 ENCOUNTER — Encounter: Payer: Self-pay | Admitting: Physical Therapy

## 2024-02-11 ENCOUNTER — Ambulatory Visit: Admitting: Physical Therapy

## 2024-02-11 DIAGNOSIS — G8929 Other chronic pain: Secondary | ICD-10-CM

## 2024-02-11 DIAGNOSIS — M6281 Muscle weakness (generalized): Secondary | ICD-10-CM

## 2024-02-11 DIAGNOSIS — M797 Fibromyalgia: Secondary | ICD-10-CM

## 2024-02-11 DIAGNOSIS — M542 Cervicalgia: Secondary | ICD-10-CM | POA: Diagnosis not present
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
## 2024-02-18 ENCOUNTER — Ambulatory Visit: Attending: Pediatrics | Admitting: Physical Therapy

## 2024-02-18 DIAGNOSIS — M542 Cervicalgia: Secondary | ICD-10-CM | POA: Insufficient documentation

## 2024-02-18 DIAGNOSIS — M797 Fibromyalgia: Secondary | ICD-10-CM | POA: Diagnosis present

## 2024-02-18 DIAGNOSIS — G8929 Other chronic pain: Secondary | ICD-10-CM | POA: Diagnosis present

## 2024-02-18 DIAGNOSIS — M6281 Muscle weakness (generalized): Secondary | ICD-10-CM | POA: Insufficient documentation

## 2024-02-18 DIAGNOSIS — M25511 Pain in right shoulder: Secondary | ICD-10-CM | POA: Diagnosis present
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
## 2024-02-19 ENCOUNTER — Encounter: Payer: Self-pay | Admitting: Physical Therapy

## 2024-02-25 ENCOUNTER — Ambulatory Visit: Admitting: Physical Therapy

## 2024-02-25 DIAGNOSIS — M6281 Muscle weakness (generalized): Secondary | ICD-10-CM

## 2024-02-25 DIAGNOSIS — M797 Fibromyalgia: Secondary | ICD-10-CM

## 2024-02-25 DIAGNOSIS — M542 Cervicalgia: Secondary | ICD-10-CM

## 2024-02-25 DIAGNOSIS — G8929 Other chronic pain: Secondary | ICD-10-CM
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
## 2024-02-26 ENCOUNTER — Encounter: Payer: Self-pay | Admitting: Physical Therapy

## 2024-02-27 ENCOUNTER — Other Ambulatory Visit: Payer: Self-pay | Admitting: Pediatrics

## 2024-02-27 DIAGNOSIS — M797 Fibromyalgia: Secondary | ICD-10-CM

## 2024-02-28 NOTE — Telephone Encounter (Signed)
 Last RF 09/23/23 #180 1 RF Requested Prescriptions  Refused Prescriptions Disp Refills   DULoxetine  (CYMBALTA ) 20 MG capsule [Pharmacy Med Name: DULOXETINE  HCL DR 20 MG CAP] 180 capsule 1    Sig: TAKE 2 CAPSULES BY MOUTH EVERY DAY     Psychiatry: Antidepressants - SNRI - duloxetine  Passed - 02/28/2024  1:06 PM      Passed - Cr in normal range and within 360 days    Creatinine, Ser  Date Value Ref Range Status  08/27/2023 0.65 0.57 - 1.00 mg/dL Final         Passed - eGFR is 30 or above and within 360 days    GFR calc Af Amer  Date Value Ref Range Status  09/27/2020 101 >59 mL/min/1.73 Final    Comment:    **In accordance with recommendations from the NKF-ASN Task force,**   [facility] is in the process of updating its eGFR calculation to the   2021 CKD-EPI creatinine equation that estimates kidney function   without a race variable.    GFR, Estimated  Date Value Ref Range Status  01/20/2023 >60 >60 mL/min Final    Comment:    (NOTE) Calculated using the CKD-EPI Creatinine Equation (2021)    eGFR  Date Value Ref Range Status  08/27/2023 99 >59 mL/min/1.73 Final         Passed - Completed PHQ-2 or PHQ-9 in the last 360 days      Passed - Last BP in normal range    BP Readings from Last 1 Encounters:  01/02/24 115/75         Passed - Valid encounter within last 6 months    Recent Outpatient Visits           1 month ago Severe eczema   [place] [facility] Herold Hadassah SQUIBB, MD   5 months ago Severe eczema   [place] [facility] Herold Hadassah SQUIBB, MD       Future Appointments             In 3 weeks Herold, Hadassah SQUIBB, MD [place] [facility]   In 2 months Santo, Stanly LABOR, MD [facility] Dept of [facility] [place] [employer], [employer]

## 2024-03-25 ENCOUNTER — Encounter: Payer: Self-pay | Admitting: Pediatrics

## 2024-03-25 ENCOUNTER — Ambulatory Visit: Payer: Medicaid Other | Admitting: Pediatrics

## 2024-03-25 VITALS — BP 109/66 | HR 78 | Temp 98.7°F | Resp 15 | Ht 67.01 in | Wt 118.2 lb

## 2024-03-25 DIAGNOSIS — M797 Fibromyalgia: Secondary | ICD-10-CM

## 2024-03-25 DIAGNOSIS — Z13220 Encounter for screening for lipoid disorders: Secondary | ICD-10-CM

## 2024-03-25 DIAGNOSIS — R413 Other amnesia: Secondary | ICD-10-CM | POA: Diagnosis not present

## 2024-03-25 DIAGNOSIS — N952 Postmenopausal atrophic vaginitis: Secondary | ICD-10-CM

## 2024-03-25 DIAGNOSIS — Z131 Encounter for screening for diabetes mellitus: Secondary | ICD-10-CM

## 2024-03-25 DIAGNOSIS — G8929 Other chronic pain: Secondary | ICD-10-CM

## 2024-03-25 DIAGNOSIS — I471 Supraventricular tachycardia, unspecified: Secondary | ICD-10-CM | POA: Diagnosis not present

## 2024-03-25 DIAGNOSIS — J069 Acute upper respiratory infection, unspecified: Secondary | ICD-10-CM

## 2024-03-25 DIAGNOSIS — R21 Rash and other nonspecific skin eruption: Secondary | ICD-10-CM

## 2024-03-25 DIAGNOSIS — I10 Essential (primary) hypertension: Secondary | ICD-10-CM

## 2024-03-25 DIAGNOSIS — M549 Dorsalgia, unspecified: Secondary | ICD-10-CM | POA: Diagnosis not present

## 2024-03-25 MED ORDER — PREGABALIN 25 MG PO CAPS: 25.0000 mg | ORAL_CAPSULE | Freq: Every day | ORAL | 2 refills | Status: DC

## 2024-03-25 MED ORDER — OSELTAMIVIR PHOSPHATE 75 MG PO CAPS: 75.0000 mg | ORAL_CAPSULE | Freq: Two times a day (BID) | ORAL | 0 refills | Status: AC

## 2024-03-25 MED ORDER — IBUPROFEN 600 MG PO TABS: 600.0000 mg | ORAL_TABLET | Freq: Every day | ORAL | 0 refills | Status: DC | PRN

## 2024-03-25 NOTE — Patient Instructions (Signed)
See you in 6 months!

## 2024-03-25 NOTE — Progress Notes (Signed)
 Office Visit  BP 109/66 (BP Location: Left Arm, Patient Position: Sitting, Cuff Size: Normal)   Pulse 78   Temp 98.7 F (37.1 C) (Oral)   Resp 15   Ht 5' 7.01 (1.702 m)   Wt 118 lb 3.2 oz (53.6 kg)   SpO2 98%   BMI 18.51 kg/m    Subjective:    Patient ID: Amber Livingston, female    DOB: [birth year], 63 y.o.   MRN: [id number]  HPI: Amber Livingston is a 63 y.o. female  Chief Complaint  Patient presents with   Follow-up    Doing well overall    Discussed the use of AI scribe software for clinical note transcription with the patient, who gave verbal consent to proceed.  History of Present Illness   Amber Livingston is a 63 year old female who presents with concerns about her skin condition and medication side effects.  She experiences severe eczema, initially perceived as a minor rash, which has worsened over time. Lesions appear on various parts of her body, causing intense itching and bleeding. Despite trying multiple treatments, including steroids, she finds no relief. The itching is described as worse than any venomous bite, and lesions reoccur as they heal. She uses clear fingernail polish to alleviate symptoms, which she finds effective. No biopsy has been performed to confirm the diagnosis.  She has discontinued several medications, including blood pressure medications, due to side effects such as drowsiness and fatigue. She feels better since stopping these medications and monitors her blood pressure, which she states is stable. She also stopped Cymbalta  and Lyrica  due to concerns about low sodium levels, which she discovered as a side effect of these medications.  She recently underwent a memory test at [facility] and saw a new neurologist who advised her to stop taking donepezil . She is focused on optimizing her memory and avoiding factors that could contribute to dementia.  She uses estrogen inserts prescribed by her urogynecologist but has reduced the frequency due to weight gain, which  she attributes to the medication. She has lost eight pounds and engages in regular physical activity, including walking three miles a day, five days a week.  She reports that her pulmonologist told her her emphysema, previously described as moderate, is now considered mild. She attributes this improvement to addressing emotional baggage and trauma, which she believes was constricting her breathing.  She is planning to travel to [place]  for a class reunion and expresses concern about potential illness during the trip, particularly the flu, which she previously experienced and believes was exacerbated by steroid treatment. She is prepared with COVID tests and is considering Tamiflu  as a precaution.  She has reduced her use of Motrin  but requests a refill to have it available as needed.     Relevant past medical, surgical, family and social history reviewed and updated as indicated. Interim medical history since our last visit reviewed. Allergies and medications reviewed and updated.  ROS per HPI unless specifically indicated above     Objective:    BP 109/66 (BP Location: Left Arm, Patient Position: Sitting, Cuff Size: Normal)   Pulse 78   Temp 98.7 F (37.1 C) (Oral)   Resp 15   Ht 5' 7.01 (1.702 m)   Wt 118 lb 3.2 oz (53.6 kg)   SpO2 98%   BMI 18.51 kg/m   Wt Readings from Last 3 Encounters:  03/25/24 118 lb 3.2 oz (53.6 kg)  01/02/24 124 lb 3.2 oz (56.3 kg)  10/07/23 126  lb 12.8 oz (57.5 kg)     Physical Exam Constitutional:      Appearance: Normal appearance.  Pulmonary:     Effort: Pulmonary effort is normal.  Musculoskeletal:        General: Normal range of motion.  Skin:    Comments: Normal skin color  Neurological:     General: No focal deficit present.     Mental Status: She is alert. Mental status is at baseline.  Psychiatric:        Mood and Affect: Mood normal.        Behavior: Behavior normal.        Thought Content: Thought content normal.          08/27/2023   10:59 AM 06/18/2023    2:38 PM 03/07/2023    1:16 PM 12/25/2022    2:52 PM 12/03/2022    9:54 AM  Depression screen PHQ 2/9  Decreased Interest 3 0 2 3 1   Down, Depressed, Hopeless 3 0 1 1 2   PHQ - 2 Score 6 0 3 4 3   Altered sleeping 3 0 1 3 0  Tired, decreased energy 3 0 3 3 1   Change in appetite 2 0 2 3 1   Feeling bad or failure about yourself  0 0 0 0 0  Trouble concentrating 2 0 1 3 1   Moving slowly or fidgety/restless 3 0 1 0 0  Suicidal thoughts 0 0 0 0 0  PHQ-9 Score 19 0 11 16 6   Difficult doing work/chores Somewhat difficult Not difficult at all Somewhat difficult Extremely dIfficult Extremely dIfficult       08/27/2023   11:02 AM 06/18/2023    2:38 PM 03/07/2023    1:16 PM 12/25/2022    2:52 PM  GAD 7 : Generalized Anxiety Score  Nervous, Anxious, on Edge 3 0 1   Control/stop worrying 3 0 1 0  Worry too much - different things 3 0 1 0  Trouble relaxing 3 0 1 2  Restless 3 0 2 3  Easily annoyed or irritable 3 0 1 2  Afraid - awful might happen 3 0 0 2  Total GAD 7 Score 21 0 7   Anxiety Difficulty Somewhat difficult Not difficult at all Somewhat difficult Extremely difficult       Assessment & Plan:  Assessment & Plan   Fibromyalgia Assessment & Plan: Stable on current regimen. Managed with Cymbalta  and Lyrica . Improvement in mood and energy levels noted.  Orders: -     Pregabalin ; Take 1 capsule (25 mg total) by mouth daily.  Dispense: 30 capsule; Refill: 2  SVT (supraventricular tachycardia) (HCC) Assessment & Plan: No recent episodes or symptoms. Discontinue metop on her own. CTM.  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 Cognitive impairment under surveillance. Neurologist advised discontinuation of donepezil  and reassured stable cognitive status. - Order labs for B12,  folate, and other vitamins to optimize memory.  Orders: -     Vitamin B12 -     Methylmalonic acid, serum -     Homocysteine -     RPR w/reflex to TrepSure -     Folate  Chronic back pain, unspecified back location, unspecified back pain laterality Assessment & Plan: Comorbid with fibromyalgia. PRN  ibuprofen  as below. Has completed PT, CTM.  Orders: -     Ibuprofen ; Take 1 tablet (600 mg total) by mouth daily as needed.  Dispense: 90 tablet; Refill: 0  Essential (primary) hypertension Assessment & Plan: Off all meds. At goal. CTM.   Upper respiratory tract infection, unspecified type Traveling and wants to have tamiflu  available if tests positive for flu since will be out of state. -     Oseltamivir  Phosphate; Take 1 capsule (75 mg total) by mouth 2 (two) times daily for 5 days.  Dispense: 10 capsule; Refill: 0  Rash Chronic eczema with severe pruritus and bleeding lesions, unresponsive to multiple treatments. No biopsy performed. - Consider referral for dermatology second opinion. - Discuss potential for biopsy to confirm diagnosis.  Lipid screening -     Lipid panel  Diabetes mellitus screening -     Hemoglobin A1c   Follow up plan: Return in about 6 months (around 09/25/2024) for Physical.  Hadassah SHAUNNA Nett, MD

## 2024-03-27 ENCOUNTER — Other Ambulatory Visit: Payer: Self-pay | Admitting: Internal Medicine

## 2024-03-27 LAB — RPR W/REFLEX TO TREPSURE

## 2024-03-31 ENCOUNTER — Encounter: Payer: Self-pay | Admitting: Pediatrics

## 2024-03-31 NOTE — Assessment & Plan Note (Signed)
 Stable on current regimen. Managed with Cymbalta  and Lyrica . Improvement in mood and energy levels noted.

## 2024-03-31 NOTE — Assessment & Plan Note (Addendum)
 Dx with early alzheimers (fmhx of this) concerns last time she was here but recent neuro eval noted nwl. Cognitive impairment under surveillance. Neurologist advised discontinuation of donepezil  and reassured stable cognitive status. - Order labs for B12, folate, and other vitamins to optimize memory.

## 2024-03-31 NOTE — Assessment & Plan Note (Signed)
 No recent episodes or symptoms. Discontinue metop on her own. CTM.

## 2024-04-01 ENCOUNTER — Ambulatory Visit: Payer: Self-pay | Admitting: Pediatrics

## 2024-04-01 LAB — COMPREHENSIVE METABOLIC PANEL WITH GFR
ALT: 17 IU/L (ref 0–32)
AST: 21 IU/L (ref 0–40)
Albumin: 4.6 g/dL (ref 3.9–4.9)
Alkaline Phosphatase: 87 IU/L (ref 44–121)
BUN/Creatinine Ratio: 7 — ABNORMAL LOW (ref 12–28)
BUN: 6 mg/dL — ABNORMAL LOW (ref 8–27)
Bilirubin Total: 0.3 mg/dL (ref 0.0–1.2)
CO2: 21 mmol/L (ref 20–29)
Calcium: 9.8 mg/dL (ref 8.7–10.3)
Chloride: 96 mmol/L (ref 96–106)
Creatinine, Ser: 0.81 mg/dL (ref 0.57–1.00)
Globulin, Total: 2.8 g/dL (ref 1.5–4.5)
Glucose: 96 mg/dL (ref 70–99)
Potassium: 4.7 mmol/L (ref 3.5–5.2)
Sodium: 132 mmol/L — ABNORMAL LOW (ref 134–144)
Total Protein: 7.4 g/dL (ref 6.0–8.5)
eGFR: 82 mL/min/1.73 (ref >59)

## 2024-04-01 LAB — CBC
Hematocrit: 44.1 % (ref 34.0–46.6)
Hemoglobin: 14.1 g/dL (ref 11.1–15.9)
MCH: 31.5 pg (ref 26.6–33.0)
MCHC: 32 g/dL (ref 31.5–35.7)
MCV: 99 fL — ABNORMAL HIGH (ref 79–97)
Platelets: 335 10*3/uL (ref 150–450)
RBC: 4.47 x10E6/uL (ref 3.77–5.28)
RDW: 11.8 % (ref 11.7–15.4)
WBC: 3.9 10*3/uL (ref 3.4–10.8)

## 2024-04-01 LAB — METHYLMALONIC ACID, SERUM: Methylmalonic Acid: 117 nmol/L (ref 0–378)

## 2024-04-01 LAB — RPR W/REFLEX TO TREPSURE: RPR: NONREACTIVE

## 2024-04-01 LAB — FOLATE: Folate: 6.6 ng/mL

## 2024-04-01 LAB — HOMOCYSTEINE: Homocysteine: 10.2 umol/L (ref 0.0–17.2)

## 2024-04-01 LAB — LIPID PANEL
Chol/HDL Ratio: 2.2 ratio (ref 0.0–4.4)
Cholesterol, Total: 182 mg/dL (ref 100–199)
HDL: 83 mg/dL (ref >39)
LDL Chol Calc (NIH): 84 mg/dL (ref 0–99)
Triglycerides: 82 mg/dL (ref 0–149)
VLDL Cholesterol Cal: 15 mg/dL (ref 5–40)

## 2024-04-01 LAB — TREPONEMAL ANTIBODIES, TPPA: Treponemal Antibodies, TPPA: NONREACTIVE

## 2024-04-01 LAB — VITAMIN B12: Vitamin B-12: 703 pg/mL (ref 232–1245)

## 2024-04-01 LAB — HEMOGLOBIN A1C
Est. average glucose Bld gHb Est-mCnc: 111 mg/dL
Hgb A1c MFr Bld: 5.5 % (ref 4.8–5.6)

## 2024-04-01 LAB — TSH: TSH: 1.08 u[IU]/mL (ref 0.450–4.500)

## 2024-04-03 ENCOUNTER — Other Ambulatory Visit: Payer: Self-pay | Admitting: Pediatrics

## 2024-04-03 ENCOUNTER — Encounter: Payer: Self-pay | Admitting: Pediatrics

## 2024-04-03 DIAGNOSIS — M797 Fibromyalgia: Secondary | ICD-10-CM

## 2024-04-03 NOTE — Assessment & Plan Note (Signed)
 Comorbid with fibromyalgia. PRN ibuprofen  as below. Has completed PT, CTM.

## 2024-04-03 NOTE — Assessment & Plan Note (Signed)
 Off all meds. At goal. CTM.

## 2024-04-04 ENCOUNTER — Other Ambulatory Visit: Payer: Self-pay | Admitting: Internal Medicine

## 2024-04-04 DIAGNOSIS — I700 Atherosclerosis of aorta: Secondary | ICD-10-CM

## 2024-04-04 DIAGNOSIS — E782 Mixed hyperlipidemia: Secondary | ICD-10-CM

## 2024-04-06 NOTE — Telephone Encounter (Signed)
 Requested Prescriptions  Pending Prescriptions Disp Refills   DULoxetine  (CYMBALTA ) 20 MG capsule [Pharmacy Med Name: DULOXETINE  HCL DR 20 MG CAP] 180 capsule 1    Sig: TAKE 2 CAPSULES BY MOUTH EVERY DAY     Psychiatry: Antidepressants - SNRI - duloxetine  Passed - 04/06/2024  2:48 PM      Passed - Cr in normal range and within 360 days    Creatinine, Ser  Date Value Ref Range Status  03/25/2024 0.81 0.57 - 1.00 mg/dL Final         Passed - eGFR is 30 or above and within 360 days    GFR calc Af Amer  Date Value Ref Range Status  09/27/2020 101 >59 mL/min/1.73 Final    Comment:    **In accordance with recommendations from the NKF-ASN Task force,**   [facility] is in the process of updating its eGFR calculation to the   2021 CKD-EPI creatinine equation that estimates kidney function   without a race variable.    GFR, Estimated  Date Value Ref Range Status  01/20/2023 >60 >60 mL/min Final    Comment:    (NOTE) Calculated using the CKD-EPI Creatinine Equation (2021)    eGFR  Date Value Ref Range Status  03/25/2024 82 >59 mL/min/1.73 Final         Passed - Completed PHQ-2 or PHQ-9 in the last 360 days      Passed - Last BP in normal range    BP Readings from Last 1 Encounters:  03/25/24 109/66         Passed - Valid encounter within last 6 months    Recent Outpatient Visits           1 week ago Fibromyalgia   [place] Hadassah SQUIBB, MD   3 months ago Severe eczema   [facility] Hadassah SQUIBB, MD   6 months ago Severe eczema   [place] [facility] Hadassah SQUIBB, MD       Future Appointments             In 1 month Chandrasekhar, Stanly LABOR, MD [facility] [employer], [employer]

## 2024-04-30 ENCOUNTER — Other Ambulatory Visit: Payer: Self-pay | Admitting: Pediatrics

## 2024-04-30 ENCOUNTER — Other Ambulatory Visit: Payer: Self-pay

## 2024-04-30 ENCOUNTER — Other Ambulatory Visit (HOSPITAL_BASED_OUTPATIENT_CLINIC_OR_DEPARTMENT_OTHER): Payer: Self-pay

## 2024-04-30 DIAGNOSIS — Z23 Encounter for immunization: Secondary | ICD-10-CM

## 2024-04-30 MED ORDER — COVID-19 MRNA VAC-TRIS(PFIZER) 30 MCG/0.3ML IM SUSY: 0.3000 mL | PREFILLED_SYRINGE | Freq: Once | INTRAMUSCULAR | 0 refills | Status: DC

## 2024-04-30 MED ORDER — COVID-19 MRNA VAC-TRIS(PFIZER) 30 MCG/0.3ML IM SUSY: 0.3000 mL | PREFILLED_SYRINGE | Freq: Once | INTRAMUSCULAR | 0 refills | Status: AC

## 2024-04-30 NOTE — Telephone Encounter (Signed)
 Copied from CRM [id number]. Topic: Clinical - Request for Lab/Test Order >> Apr 30, 2024  8:50 AM Myrick T wrote: Reason for CRM: patient called needing a script for the Covid shot sent to  [id number], [place] - [address]  Phone: [phone] Fax: [phone]

## 2024-04-30 NOTE — Progress Notes (Signed)
 Resent electronic rx for vaccine  Amber SHAUNNA Nett, MD

## 2024-04-30 NOTE — Telephone Encounter (Signed)
Prescription pended for provider review

## 2024-04-30 NOTE — Telephone Encounter (Signed)
 Copied from CRM #[id number]. Topic: Clinical - Prescription Issue >> Apr 30, 2024  8:52 AM [name] wrote: Reason for CRM: Patient is requesting a prescription for the update COVID vaccine.    [facility] [address] [place] [place] - [address] [place], [place] [address] Phone: [phone]  Fax: [phone]

## 2024-05-10 ENCOUNTER — Ambulatory Visit: Attending: Internal Medicine | Admitting: Internal Medicine

## 2024-05-10 VITALS — BP 122/70 | HR 76 | Ht 68.0 in | Wt 121.0 lb

## 2024-05-10 DIAGNOSIS — I10 Essential (primary) hypertension: Secondary | ICD-10-CM | POA: Diagnosis present

## 2024-05-10 DIAGNOSIS — I471 Supraventricular tachycardia, unspecified: Secondary | ICD-10-CM | POA: Diagnosis not present

## 2024-05-10 DIAGNOSIS — I700 Atherosclerosis of aorta: Secondary | ICD-10-CM | POA: Insufficient documentation

## 2024-05-10 NOTE — Patient Instructions (Signed)
 Medication Instructions:  Your physician recommends that you continue on your current medications as directed. Please refer to the Current Medication list given to you today.  *If you need a refill on your cardiac medications before your next appointment, please call your pharmacy*  Lab Work: NONE  If you have labs (blood work) drawn today and your tests are completely normal, you will receive your results only by: MyChart Message (if you have MyChart) OR A paper copy in the mail If you have any lab test that is abnormal or we need to change your treatment, we will call you to review the results.  Testing/Procedures: NONE  Follow-Up: At [facility], you and your health needs are our priority.  As part of our continuing mission to provide you with exceptional heart care, our providers are all part of one team.  This team includes your primary Cardiologist (physician) and Advanced Practice Providers or APPs (Physician Assistants and Nurse Practitioners) who all work together to provide you with the care you need, when you need it.  Your next appointment:   1 year(s)  Provider:   Stanly Leavens, MD
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
## 2024-05-17 ENCOUNTER — Ambulatory Visit
Admission: RE | Admit: 2024-05-17 | Discharge: 2024-05-17 | Disposition: A | Discharge status: Home / Self Care | Source: Ambulatory Visit | Attending: Pediatrics | Admitting: Pediatrics

## 2024-05-17 DIAGNOSIS — F17210 Nicotine dependence, cigarettes, uncomplicated: Secondary | ICD-10-CM | POA: Insufficient documentation

## 2024-05-17 DIAGNOSIS — Z122 Encounter for screening for malignant neoplasm of respiratory organs: Secondary | ICD-10-CM | POA: Diagnosis present

## 2024-05-21 ENCOUNTER — Other Ambulatory Visit: Payer: Self-pay | Admitting: Obstetrics and Gynecology

## 2024-05-21 ENCOUNTER — Other Ambulatory Visit: Payer: Self-pay | Admitting: Acute Care

## 2024-05-21 DIAGNOSIS — Z79890 Hormone replacement therapy (postmenopausal): Secondary | ICD-10-CM

## 2024-05-21 DIAGNOSIS — Z122 Encounter for screening for malignant neoplasm of respiratory organs: Secondary | ICD-10-CM

## 2024-05-21 DIAGNOSIS — Z87891 Personal history of nicotine dependence: Secondary | ICD-10-CM

## 2024-05-31 ENCOUNTER — Encounter (HOSPITAL_BASED_OUTPATIENT_CLINIC_OR_DEPARTMENT_OTHER): Payer: Self-pay | Admitting: Pulmonary Disease

## 2024-05-31 ENCOUNTER — Ambulatory Visit (HOSPITAL_BASED_OUTPATIENT_CLINIC_OR_DEPARTMENT_OTHER): Admitting: Pulmonary Disease

## 2024-05-31 VITALS — BP 134/66 | HR 59 | Ht 68.0 in | Wt 120.9 lb

## 2024-05-31 DIAGNOSIS — R490 Dysphonia: Secondary | ICD-10-CM | POA: Diagnosis not present

## 2024-05-31 DIAGNOSIS — R918 Other nonspecific abnormal finding of lung field: Secondary | ICD-10-CM

## 2024-05-31 DIAGNOSIS — J449 Chronic obstructive pulmonary disease, unspecified: Secondary | ICD-10-CM

## 2024-05-31 MED ORDER — ALBUTEROL SULFATE HFA 108 (90 BASE) MCG/ACT IN AERS: 2.0000 | INHALATION_SPRAY | RESPIRATORY_TRACT | 5 refills | Status: AC | PRN

## 2024-05-31 MED ORDER — STIOLTO RESPIMAT 2.5-2.5 MCG/ACT IN AERS: 2.0000 | INHALATION_SPRAY | Freq: Every day | RESPIRATORY_TRACT | 11 refills | Status: DC
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
## 2024-06-07 ENCOUNTER — Other Ambulatory Visit: Payer: Self-pay

## 2024-06-07 DIAGNOSIS — Z79890 Hormone replacement therapy (postmenopausal): Secondary | ICD-10-CM

## 2024-06-07 MED ORDER — MEDROXYPROGESTERONE ACETATE 2.5 MG PO TABS: 2.5000 mg | ORAL_TABLET | Freq: Every day | ORAL | 0 refills | Status: DC

## 2024-06-07 MED ORDER — ESTRADIOL 0.5 MG PO TABS: ORAL_TABLET | ORAL | 0 refills | Status: DC

## 2024-06-07 NOTE — Progress Notes (Signed)
 Patient has been followed by Dr. Janit.  Last visit 08/2023 to discuss HRT.  She has transferred care since Dr. Janit is no longer at [facility] and has an appointment with Dr. Izell 07/28/24.  She is requesting a refill of her HRT until she can be seen by them.  90 day refill authorized today.

## 2024-06-10 ENCOUNTER — Encounter: Payer: Self-pay | Admitting: Pediatrics

## 2024-06-10 ENCOUNTER — Other Ambulatory Visit: Payer: Self-pay | Admitting: Obstetrics and Gynecology

## 2024-06-10 ENCOUNTER — Ambulatory Visit: Admitting: Pediatrics

## 2024-06-10 ENCOUNTER — Telehealth: Payer: Self-pay | Admitting: Pediatrics

## 2024-06-10 VITALS — BP 115/74 | HR 60 | Wt 120.4 lb

## 2024-06-10 DIAGNOSIS — G5601 Carpal tunnel syndrome, right upper limb: Secondary | ICD-10-CM

## 2024-06-10 DIAGNOSIS — D7589 Other specified diseases of blood and blood-forming organs: Secondary | ICD-10-CM

## 2024-06-10 DIAGNOSIS — M79671 Pain in right foot: Secondary | ICD-10-CM

## 2024-06-10 DIAGNOSIS — L309 Dermatitis, unspecified: Secondary | ICD-10-CM

## 2024-06-10 DIAGNOSIS — E871 Hypo-osmolality and hyponatremia: Secondary | ICD-10-CM

## 2024-06-10 DIAGNOSIS — Z79890 Hormone replacement therapy (postmenopausal): Secondary | ICD-10-CM

## 2024-06-10 NOTE — Telephone Encounter (Signed)
 Patient brought in vaccinations for provider to review and be scanned to her chart. Placed in providers folder.

## 2024-06-10 NOTE — Progress Notes (Signed)
 Office Visit  BP 115/74   Pulse 60   Wt 120 lb 6.4 oz (54.6 kg)   SpO2 98%   BMI 18.31 kg/m    Subjective:    Patient ID: Amber Livingston, female    DOB: 24-Jan-1961, 63 y.o.   MRN: [id number]  HPI: Amber Livingston is a 63 y.o. female  Chief Complaint  Patient presents with   Follow-up    Discussed the use of AI scribe software for clinical note transcription with the patient, who gave verbal consent to proceed.  History of Present Illness   Amber Livingston is a 63 year old female who presents with a persistent rash and thumb pain.  She has a persistent rash that is intensely itchy and described as feeling like 'venom spewing out.' The rash is deep, takes a long time to heal, and is currently bandaged. It is located on her arms and legs, causing significant discomfort and requiring her to wear long sleeves. She has tried multiple steroid creams and pills without success and has resorted to using clear fingernail polish to seal the lesions. Black Jamaican castor oil has helped reduce the severity but has not cured the rash.  She experiences severe pain in her thumb, which has worsened over time, leading to a loss of function and frequent dropping of objects. The pain radiates up her arm, significantly affecting her grip strength. She has previously received injections for this issue, which provided temporary relief, but the pain has returned and is described as 'worse than ever.'  She reports a bone in her foot that is 'pushing out,' causing significant swelling and pain, which has forced her to stop walking her usual 3.5 miles a day. This has impacted her mood and physical activity levels.  She mentions a recent blood test indicating enlarged red blood cells. She is currently taking Cymbalta , Stiletto inhaler, and albuterol  emergency inhaler. She has been taking varying doses of Cymbalta , including two or three 20 mg doses, and has a supply of 60 mg doses available.  She recently had an optometry  appointment where she was informed of a cataract forming in her right eye, affecting her vision. She expresses concern over the cost of prescription glasses.  No recent illness such as flu. Stopping her skin and blood pressure medications improved her speech and thought process. The rash is itchy, and the steroid cream initially helped but is no longer effective.     Relevant past medical, surgical, family and social history reviewed and updated as indicated. Interim medical history since our last visit reviewed. Allergies and medications reviewed and updated.  ROS per HPI unless specifically indicated above     Objective:    BP 115/74   Pulse 60   Wt 120 lb 6.4 oz (54.6 kg)   SpO2 98%   BMI 18.31 kg/m   Wt Readings from Last 3 Encounters:  06/14/24 120 lb 6.4 oz (54.6 kg)  06/10/24 120 lb 6.4 oz (54.6 kg)  05/31/24 120 lb 14.4 oz (54.8 kg)     Physical Exam Constitutional:      Appearance: Normal appearance.  Pulmonary:     Effort: Pulmonary effort is normal.  Musculoskeletal:        General: Normal range of motion.     Right wrist: Tenderness present.     Comments: Positive tinnels  Skin:    Comments: Normal skin color  Neurological:     General: No focal deficit present.     Mental Status: She is  alert. Mental status is at baseline.  Psychiatric:        Mood and Affect: Mood normal.        Behavior: Behavior normal.        Thought Content: Thought content normal.         08/27/2023   10:59 AM 06/18/2023    2:38 PM 03/07/2023    1:16 PM 12/25/2022    2:52 PM 12/03/2022    9:54 AM  Depression screen PHQ 2/9  Decreased Interest 3 0 2 3 1   Down, Depressed, Hopeless 3 0 1 1 2   PHQ - 2 Score 6 0 3 4 3   Altered sleeping 3 0 1 3 0  Tired, decreased energy 3 0 3 3 1   Change in appetite 2 0 2 3 1   Feeling bad or failure about yourself  0 0 0 0 0  Trouble concentrating 2 0 1 3 1   Moving slowly or fidgety/restless 3 0 1 0 0  Suicidal thoughts 0 0 0 0 0  PHQ-9 Score 19  0 11 16 6   Difficult doing work/chores Somewhat difficult Not difficult at all Somewhat difficult Extremely dIfficult Extremely dIfficult       08/27/2023   11:02 AM 06/18/2023    2:38 PM 03/07/2023    1:16 PM 12/25/2022    2:52 PM  GAD 7 : Generalized Anxiety Score  Nervous, Anxious, on Edge 3 0 1   Control/stop worrying 3 0 1 0  Worry too much - different things 3 0 1 0  Trouble relaxing 3 0 1 2  Restless 3 0 2 3  Easily annoyed or irritable 3 0 1 2  Afraid - awful might happen 3 0 0 2  Total GAD 7 Score 21 0 7   Anxiety Difficulty Somewhat difficult Not difficult at all Somewhat difficult Extremely difficult       Assessment & Plan:  Assessment & Plan   Severe eczema Assessment & Plan: Chronic dermatitis with severe itching and deep lesions. Previous treatments ineffective, current management provides partial relief. - Refer to dermatology at [facility] for a second opinion. - Continue using current topical treatments as needed for symptom relief. - Consider alternative therapies such as Jamaican castor oil for symptomatic relief.  Orders: -     CBC with Differential/Platelet -     Ambulatory referral to Dermatology  Hyponatremia Assessment & Plan: Chronic, previously borderline. Plan to repeat today.   Orders: -     Basic metabolic panel with GFR  Carpal tunnel syndrome on right Severe pain and dysfunction affecting grip strength and daily activities. Previous treatments ineffective, condition worsening. - Refer to orthopedic specialist for evaluation and possible injection therapy. - Consider occupational therapy for rehabilitation and strengthening exercises.  Right foot pain Pain with bony prominence and swelling, reducing mobility and physical activity. - Refer to podiatry for evaluation and management. - Consider prescription footwear or orthotics if recommended by podiatry. -     Ambulatory referral to Podiatry  Macrocytosis Macrocytosis noted on recent blood  work, previous B12 and folate levels normal. Suspect due to underlying lung disease. - Order repeat blood work to monitor macrocytosis and establish a baseline.   Follow up plan: Return in about 4 weeks (around 07/08/2024) for Chronic illness f/u.  Hadassah SHAUNNA Nett, MD

## 2024-06-10 NOTE — Telephone Encounter (Signed)
 Already abstracted vaccines

## 2024-06-11 ENCOUNTER — Ambulatory Visit: Payer: Self-pay | Admitting: Pediatrics

## 2024-06-11 LAB — BASIC METABOLIC PANEL WITH GFR
BUN/Creatinine Ratio: 10 — ABNORMAL LOW (ref 12–28)
BUN: 8 mg/dL (ref 8–27)
CO2: 20 mmol/L (ref 20–29)
Calcium: 9.9 mg/dL (ref 8.7–10.3)
Chloride: 94 mmol/L — ABNORMAL LOW (ref 96–106)
Creatinine, Ser: 0.81 mg/dL (ref 0.57–1.00)
Glucose: 74 mg/dL (ref 70–99)
Potassium: 4.6 mmol/L (ref 3.5–5.2)
Sodium: 133 mmol/L — ABNORMAL LOW (ref 134–144)
eGFR: 82 mL/min/1.73 (ref >59)

## 2024-06-11 LAB — CBC WITH DIFFERENTIAL/PLATELET
Basophils Absolute: 0.1 x10E3/uL (ref 0.0–0.2)
Basos: 1 %
EOS (ABSOLUTE): 0.2 x10E3/uL (ref 0.0–0.4)
Eos: 3 %
Hematocrit: 40.1 % (ref 34.0–46.6)
Hemoglobin: 13.6 g/dL (ref 11.1–15.9)
Immature Grans (Abs): 0 x10E3/uL (ref 0.0–0.1)
Immature Granulocytes: 0 %
Lymphocytes Absolute: 1.2 x10E3/uL (ref 0.7–3.1)
Lymphs: 19 %
MCH: 32.5 pg (ref 26.6–33.0)
MCHC: 33.9 g/dL (ref 31.5–35.7)
MCV: 96 fL (ref 79–97)
Monocytes Absolute: 0.5 x10E3/uL (ref 0.1–0.9)
Monocytes: 8 %
Neutrophils Absolute: 4.1 x10E3/uL (ref 1.4–7.0)
Neutrophils: 69 %
Platelets: 336 x10E3/uL (ref 150–450)
RBC: 4.19 x10E6/uL (ref 3.77–5.28)
RDW: 11.5 % — ABNORMAL LOW (ref 11.7–15.4)
WBC: 6 x10E3/uL (ref 3.4–10.8)

## 2024-06-14 ENCOUNTER — Other Ambulatory Visit: Payer: Self-pay | Admitting: Pediatrics

## 2024-06-14 ENCOUNTER — Ambulatory Visit: Admitting: Podiatry

## 2024-06-14 ENCOUNTER — Other Ambulatory Visit: Payer: Self-pay | Admitting: Internal Medicine

## 2024-06-14 ENCOUNTER — Ambulatory Visit (INDEPENDENT_AMBULATORY_CARE_PROVIDER_SITE_OTHER)

## 2024-06-14 VITALS — Ht 68.0 in | Wt 120.4 lb

## 2024-06-14 DIAGNOSIS — M21611 Bunion of right foot: Secondary | ICD-10-CM | POA: Diagnosis not present

## 2024-06-14 DIAGNOSIS — M79671 Pain in right foot: Secondary | ICD-10-CM

## 2024-06-14 DIAGNOSIS — M2011 Hallux valgus (acquired), right foot: Secondary | ICD-10-CM | POA: Diagnosis not present

## 2024-06-14 NOTE — Patient Instructions (Signed)
  VISIT SUMMARY: You came in today because of severe pain in your left foot due to a bunion that has been bothering you for about six weeks. The pain has been so intense that it wakes you up at night and has made it difficult for you to walk normally. You have tried various non-surgical treatments without much relief. Given your situation and the impact on your daily life, we discussed and decided on surgical intervention to correct the bunion.  YOUR PLAN: -RIGHT HALLUX VALGUS (BUNION) WITH ASSOCIATED RIGHT FOOT PAIN: A bunion is a bony bump that forms on the joint at the base of your big toe, causing pain and discomfort. Since non-surgical treatments have not been effective, we have decided to proceed with surgical correction through a minimally invasive double osteotomy. This surgery has a high success rate and involves cutting and realigning the bone. Post-operative care will include limited weight-bearing, use of a walking boot, and possibly physical therapy to ensure proper healing.  INSTRUCTIONS: Please obtain medical clearance from your primary care physician and pulmonologist before the surgery. We will schedule the outpatient surgery at the hospital and provide you with detailed post-operative care instructions, including the use of a walking boot and limited weight-bearing. A follow-up appointment will be arranged after the surgery to monitor your recovery.                      Contains text generated by Abridge.                                 Contains text generated by Abridge.

## 2024-06-14 NOTE — Progress Notes (Signed)
 Subjective:  Patient ID: Amber Livingston, female    DOB: Dec 18, 1960,  MRN: [id number]  Chief Complaint  Patient presents with   Foot Pain    Rm 7 Patient is here for right bunion pain. Pain started 18 months ago.     Discussed the use of AI scribe software for clinical note transcription with the patient, who gave verbal consent to proceed.  History of Present Illness Amber Livingston is a 63 year old female who presents with severe left foot pain due to a bunion.  She experiences severe pain in her left foot, attributed to a bunion, which began approximately six weeks ago. The pain is described as unbearable, with a sensation of pressure and pushing outwards. It is exacerbated by wearing shoes, especially those that are not wide enough, and is severe enough to wake her at night. She has been unable to resume her usual walking routine and walks on the side of her foot to avoid discomfort.  She has attempted various non-surgical treatments, including wearing flats, using a big Band-Aid, and applying black Jamaican castor oil and bee venom, but these have not provided significant relief. She also tried wearing men's shoes for a wider fit, but they were too large. She has not used gel pads or bunion shields.  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 She follows a vegetarian and fruitarian diet, avoiding red meat, bread, and dairy to manage her fibromyalgia and back issues. She has a history of smoking but has since quit.  She is concerned about her ability to remain independent, as she has no children or family to rely on, and her partner is older. She is worried about the impact of her foot condition on her mobility, especially given her history of carpal tunnel syndrome and the physical demands of her previous work in [employer].      Objective:    Physical Exam VASCULAR: DP  and PT pulse palpable. Foot is warm and well-perfused. Capillary fill time is brisk. DERMATOLOGIC: Normal skin turgor texture and temperature. No open lesions or rashes or ulcerations. NEUROLOGIC: Normal sensation to light touch and pressure. No paresthesias on examination. ORTHOPEDIC: Smooth pain-free range of motion of all examined joints. Pain over the medial eminence of the right foot bunion medially.  She has hallux valgus deformity. No ecchymosis or bruising.   No images are attached to the encounter.    Results RADIOLOGY Foot X-ray: Mild to moderate metatarsus adductus, mild to moderate hallux valgus with increased intermetatarsal Livingston and increased tibial sesamoid position, elevation of the metatarsal, and hallux interphalangeal deformity (06/14/2024)   Assessment:   1. Hallux valgus with bunions, right      Plan:  Patient was evaluated and treated and all questions answered.  Assessment and Plan Assessment & Plan Right hallux valgus (bunion) with associated right foot pain Chronic right hallux valgus with severe pain exacerbated over the past six weeks, affecting daily activities and sleep. Swelling has decreased, but pain persists, especially with shoe pressure. Previous non-surgical treatments, including padding and topical applications, have been ineffective. Surgical intervention is considered due to the failure of conservative measures. Discussed surgical correction via double osteotomy through a minimally invasive approach. Risks include infection, slow bone healing, and recurrence, but success rates are high, with low recurrence. Emphasized the importance of post-operative care, including limited weight-bearing and potential physical therapy. Shared decision-making led to the choice of surgery,  considering her lack of family support and the impact on her quality of life.  We discussed surgical outcomes typically are successful, with a low chance of recurrence. Bone healing  may take three to four months, with a risk of delayed or nonunion - Proceed with surgical correction via double osteotomy through a minimally invasive approach, nonoperative treatment thus far has been ineffective for her. - Obtain medical clearance from primary care physician and pulmonologist prior to surgery. - Schedule outpatient surgery at the hospital. - Provide post-operative care instructions, including use of a walking boot and limited weight-bearing. - Arrange follow-up appointment post-surgery.     Surgical plan:  Procedure: - Right foot double osteotomy bunionectomy  Location: - [facility]  Anesthesia plan: - General With Exparel  block  Postoperative pain plan: - Tylenol  1000 mg every 6 hours, ibuprofen  600 mg every 6 hours, gabapentin 300 mg every 8 hours x5 days, oxycodone  5 mg 1-2 tabs every 6 hours only as needed  DVT prophylaxis: - None required  WB Restrictions / DME needs: - WBAT in CAM boot postop   Return for after surgery.

## 2024-06-15 NOTE — Telephone Encounter (Signed)
 Requested Prescriptions  Pending Prescriptions Disp Refills   pantoprazole  (PROTONIX ) 40 MG tablet [Pharmacy Med Name: PANTOPRAZOLE  SOD DR 40 MG TAB] 90 tablet 1    Sig: TAKE 1 TABLET BY MOUTH EVERY DAY AS NEEDED     Gastroenterology: Proton Pump Inhibitors Passed - 06/15/2024 12:57 PM      Passed - Valid encounter within last 12 months    Recent Outpatient Visits           5 days ago Severe eczema   [place] [facility] Herold Hadassah SQUIBB, MD   2 months ago Fibromyalgia   [place] [facility] Herold Hadassah SQUIBB, MD   5 months ago Severe eczema   [place] [facility] Herold Hadassah SQUIBB, MD   8 months ago Severe eczema   [place] [facility] Herold Hadassah SQUIBB, MD

## 2024-06-16 ENCOUNTER — Encounter: Payer: Self-pay | Admitting: Pediatrics

## 2024-06-16 NOTE — Assessment & Plan Note (Signed)
 Chronic, previously borderline. Plan to repeat today.

## 2024-06-16 NOTE — Assessment & Plan Note (Signed)
 Chronic dermatitis with severe itching and deep lesions. Previous treatments ineffective, current management provides partial relief. - Refer to dermatology at [facility] for a second opinion. - Continue using current topical treatments as needed for symptom relief. - Consider alternative therapies such as Jamaican castor oil for symptomatic relief.

## 2024-06-22 ENCOUNTER — Telehealth: Payer: Self-pay | Admitting: Internal Medicine

## 2024-06-22 ENCOUNTER — Telehealth: Payer: Self-pay | Admitting: Podiatry

## 2024-06-22 NOTE — Telephone Encounter (Signed)
 Called and scheduled patient for surgery on 07/09/2024 @ [facility]. Faxed over H&P and called for cardiac clearance on 06/22/2024. Patient is not on any blood thinners or GLP1 medications. Confirmed patients preferred pharmacy is correct in chart.

## 2024-06-22 NOTE — Telephone Encounter (Signed)
   Pre-operative Risk Assessment    Patient Name: Amber Livingston  DOB: 14-Oct-1960 MRN: [id number]      Request for Surgical Clearance    Procedure:  Correction Hallux Valgus with Bunionectony with Sesamoidectomy when performed with Double osteotomy.   Date of Surgery:  Clearance 07/09/24                                 Surgeon: Dr. Juliene Medicine Surgeon's Group or Practice Name: [facility] Phone number: [phone] Fax number: [phone]   Type of Clearance Requested:   - Medical  - Pharmacy:  Hold        Type of Anesthesia:  General    Additional requests/questions:  Please advise surgeon/provider what medications should be held.  Bonney Willie Daring   06/22/2024, 11:29 AM

## 2024-06-22 NOTE — Telephone Encounter (Signed)
   Name: Amber Livingston  DOB: 04/12/1961  MRN: [id number]   Primary Cardiologist: Stanly DELENA Leavens, MD  Chart reviewed as part of pre-operative protocol coverage.   Amber Livingston was last seen on 05/10/2024 by Dr. Leavens, was doing well at that time, able to d/c some of her medications and had been physically active without symptoms prior to developing foot pain.   Therefore, based on ACC/AHA guidelines, the patient would be at acceptable risk for the planned procedure without further cardiovascular testing.   She is not on any antiplatelets or anticoagulants.  I will route this recommendation to the requesting party via Epic fax function and remove from pre-op pool. Please call with questions.  Delon JAYSON Hoover, NP 06/22/2024, 11:43 AM

## 2024-06-23 ENCOUNTER — Telehealth: Payer: Self-pay | Admitting: Podiatry

## 2024-06-23 NOTE — Telephone Encounter (Signed)
 SURGERY WILL BE AT [facility]

## 2024-06-23 NOTE — Telephone Encounter (Signed)
 DOS- 07/09/2024  CORRECTION HALLUX VALGUS WITH BUNIONECTOMY, WITH SESAMOIDECTOMY WHEN PERFORMED; WITH DOUBLE OSTEOTOMY ANY METHOD RT- 28299  HE[employer] EFFECTIVE DATE- 09/19/2021  PER CARELON PORTAL, PRIOR AUTH FOR CPT CODE 71700 HAS BEEN APPROVED FROM 07/09/2024-09/06/2024. AUTH# [id number]

## 2024-06-30 ENCOUNTER — Telehealth: Payer: Self-pay

## 2024-06-30 NOTE — Telephone Encounter (Signed)
 On this date 06/29/24  paperwork has been received on patient's behalf. The paperwork has been received via fax to our office.. The paperwork that has been received is Surgical Clearance.   Paperwork to be returned via Fax.   Paperwork to be placed in the incomplete folders ahead of her appointment tomorrow 07/01/2024. Paperwork is expected to be completed and returned 5-7 business days.

## 2024-07-01 ENCOUNTER — Ambulatory Visit: Admitting: Pediatrics

## 2024-07-01 ENCOUNTER — Encounter: Payer: Self-pay | Admitting: Pediatrics

## 2024-07-01 VITALS — BP 108/67 | HR 80 | Temp 97.9°F | Resp 14 | Ht 67.99 in | Wt 121.6 lb

## 2024-07-01 DIAGNOSIS — M2011 Hallux valgus (acquired), right foot: Secondary | ICD-10-CM | POA: Diagnosis not present

## 2024-07-01 DIAGNOSIS — Z01818 Encounter for other preprocedural examination: Secondary | ICD-10-CM | POA: Diagnosis not present

## 2024-07-01 NOTE — Progress Notes (Signed)
 Office Visit  BP 108/67 (BP Location: Left Arm, Patient Position: Sitting, Cuff Size: Normal)   Pulse 80   Temp 97.9 F (36.6 C) (Oral)   Resp 14   Ht 5' 7.99 (1.727 m)   Wt 121 lb 9.6 oz (55.2 kg)   SpO2 98%   BMI 18.49 kg/m    Subjective:    Patient ID: Amber Livingston, female    DOB: [birth year], 63 y.o.   MRN: [id number]  HPI: Amber Livingston is a 63 y.o. female  Chief Complaint  Patient presents with   Surgical clearance    Podiatry surgery     Discussed the use of AI scribe software for clinical note transcription with the patient, who gave verbal consent to proceed.  History of Present Illness   The patient presents with foot pain and inability to walk, seeking surgical intervention.  They have significant foot pain exacerbated by weather changes, described as inflamed, and it has worsened to the point where they cannot put pressure on the foot. This has resulted in an inability to walk for the past three months, during which they have been using a boot and unable to drive.  They have been proactive in seeking treatment and have consulted with multiple specialists, including a footbed specialist and a wrist specialist. They are scheduled for surgery in one week.  They use various treatments to manage their symptoms, including THC gummies, which they have increased in frequency due to the severity of their pain. They also take turmeric twice a day and have been using Jamaican black castor oil, which they report has been beneficial.  They express concern about the impact of their condition on their daily life, including their inability to drive and the challenges of managing household tasks. They have recently acquired a scooter to aid in mobility.        Relevant past medical, surgical, family and social history reviewed and updated as indicated. Interim medical history since our last visit reviewed. Allergies and medications reviewed and updated.  ROS per HPI unless  specifically indicated above     Objective:    BP 108/67 (BP Location: Left Arm, Patient Position: Sitting, Cuff Size: Normal)   Pulse 80   Temp 97.9 F (36.6 C) (Oral)   Resp 14   Ht 5' 7.99 (1.727 m)   Wt 121 lb 9.6 oz (55.2 kg)   SpO2 98%   BMI 18.49 kg/m   Wt Readings from Last 3 Encounters:  07/09/24 116 lb 12.8 oz (53 kg)  07/01/24 121 lb 9.6 oz (55.2 kg)  06/14/24 120 lb 6.4 oz (54.6 kg)     Physical Exam Constitutional:      Appearance: Normal appearance.  HENT:     Head: Normocephalic and atraumatic.  Eyes:     Pupils: Pupils are equal, round, and reactive to light.  Cardiovascular:     Rate and Rhythm: Normal rate and regular rhythm.     Pulses: Normal pulses.     Heart sounds: Normal heart sounds.  Pulmonary:     Effort: Pulmonary effort is normal.     Breath sounds: Normal breath sounds.  Abdominal:     General: Abdomen is flat.     Palpations: Abdomen is soft.  Musculoskeletal:        General: Normal range of motion.     Cervical back: Normal range of motion.  Skin:    General: Skin is warm and dry.     Capillary Refill: Capillary  refill takes less than 2 seconds.  Neurological:     General: No focal deficit present.     Mental Status: She is alert. Mental status is at baseline.  Psychiatric:        Mood and Affect: Mood normal.        Behavior: Behavior normal.         07/01/2024    1:09 PM 08/27/2023   10:59 AM 06/18/2023    2:38 PM 03/07/2023    1:16 PM 12/25/2022    2:52 PM  Depression screen PHQ 2/9  Decreased Interest 0 3 0 2 3  Down, Depressed, Hopeless 0 3 0 1 1  PHQ - 2 Score 0 6 0 3 4  Altered sleeping 0 3 0 1 3  Tired, decreased energy 0 3 0 3 3  Change in appetite 0 2 0 2 3  Feeling bad or failure about yourself  0 0 0 0 0  Trouble concentrating 0 2 0 1 3  Moving slowly or fidgety/restless 0 3 0 1 0  Suicidal thoughts 0 0 0 0 0  PHQ-9 Score 0 19  0  11  16   Difficult doing work/chores  Somewhat difficult Not difficult at  all Somewhat difficult Extremely dIfficult     Data saved with a previous flowsheet row definition       07/01/2024    1:09 PM 08/27/2023   11:02 AM 06/18/2023    2:38 PM 03/07/2023    1:16 PM  GAD 7 : Generalized Anxiety Score  Nervous, Anxious, on Edge 0 3 0 1  Control/stop worrying 0 3 0 1  Worry too much - different things 0 3 0 1  Trouble relaxing 0 3 0 1  Restless 0 3 0 2  Easily annoyed or irritable 0 3 0 1  Afraid - awful might happen 0 3 0 0  Total GAD 7 Score 0 21 0 7  Anxiety Difficulty  Somewhat difficult Not difficult at all Somewhat difficult       Assessment & Plan:  Assessment & Plan   Acquired hallux valgus, right Preop examination Surgery scheduled in one week for pain and inflammation. Discussed post-operative pain management and recovery. No pre-operative blood transfusion needed. RCRI 0. - Proceed with scheduled surgery in one week. - Arrange follow-up appointment one week post-surgery to assess pain management and recovery. - Continue with physical therapy post-surgery.     Follow up plan: No follow-ups on file.  Hadassah SHAUNNA Nett, MD

## 2024-07-02 ENCOUNTER — Encounter
Admission: RE | Admit: 2024-07-02 | Discharge: 2024-07-02 | Disposition: A | Discharge status: Home / Self Care | Source: Ambulatory Visit | Attending: Podiatry | Admitting: Podiatry

## 2024-07-02 ENCOUNTER — Other Ambulatory Visit: Payer: Self-pay

## 2024-07-02 HISTORY — DX: Pneumonia, unspecified organism: J18.9

## 2024-07-02 HISTORY — DX: Hallux valgus (acquired), right foot: M20.11

## 2024-07-02 HISTORY — DX: Other complications of anesthesia, initial encounter: T88.59XA

## 2024-07-02 NOTE — Patient Instructions (Addendum)
 Your procedure is scheduled on: 07/10/23 - Friday Report to the Registration Desk on the 1st floor of the [facility]. To find out your arrival time, please call [phone] between 1PM - 3PM on: 07/08/24 - Thursday If your arrival time is 6:00 am, do not arrive before that time as the [facility] entrance doors do not open until 6:00 am.  REMEMBER: Instructions that are not followed completely may result in serious medical risk, up to and including death; or upon the discretion of your surgeon and anesthesiologist your surgery may need to be rescheduled.  Do not eat food after midnight the night before surgery.  No gum chewing or hard candies.  You may however, drink CLEAR liquids up to 2 hours before you are scheduled to arrive for your surgery. Do not drink anything within 2 hours of your scheduled arrival time.  Clear liquids include: - water  - apple juice without pulp - gatorade (not RED colors) - black coffee or tea (Do NOT add milk or creamers to the coffee or tea) Do NOT drink anything that is not on this list.  One week prior to surgery: Stop Anti-inflammatories (NSAIDS) such as Advil , Aleve, Ibuprofen , Motrin , Naproxen, Naprosyn and Aspirin based products such as Excedrin, Goody's Powder, BC Powder. You may take Tylenol  if needed for pain up until the day of surgery.  Stop ANY OVER THE COUNTER supplements until after surgery.  ON THE DAY OF SURGERY ONLY TAKE THESE MEDICATIONS WITH SIPS OF WATER:  albuterol  (PROVENTIL ) if needed DULoxetine  (CYMBALTA )  pregabalin  (LYRICA )  STIOLTO RESPIMAT   pantoprazole  (PROTONIX )   Use inhaler albuterol  (VENTOLIN  HFA)  on the day of surgery and bring to the hospital.   No Alcohol for 24 hours before or after surgery.  No Smoking including e-cigarettes for 24 hours before surgery.  No chewable tobacco products for at least 6 hours before surgery.  No nicotine  patches on the day of surgery.  Do not use any recreational drugs  for at least a week (preferably 2 weeks) before your surgery.  Please be advised that the combination of cocaine and anesthesia may have negative outcomes, up to and including death. If you test positive for cocaine, your surgery will be cancelled.  On the morning of surgery brush your teeth with toothpaste and water, you may rinse your mouth with mouthwash if you wish. Do not swallow any toothpaste or mouthwash.  Do not wear jewelry, make-up, hairpins, clips or nail polish.  For welded (permanent) jewelry: bracelets, anklets, waist bands, etc.  Please have this removed prior to surgery.  If it is not removed, there is a chance that hospital personnel will need to cut it off on the day of surgery.  Do not wear lotions, powders, or perfumes.   Do not shave body hair from the neck down 48 hours before surgery.  Contact lenses, hearing aids and dentures may not be worn into surgery.  Do not bring valuables to the hospital. [facility] is not responsible for any missing/lost belongings or valuables.   Notify your doctor if there is any change in your medical condition (cold, fever, infection).  Wear comfortable clothing (specific to your surgery type) to the hospital.  After surgery, you can help prevent lung complications by doing breathing exercises.  Take deep breaths and cough every 1-2 hours. Your doctor may order a device called an Incentive Spirometer to help you take deep breaths.  If you are being admitted to the hospital overnight, leave  your suitcase in the car. After surgery it may be brought to your room.  In case of increased patient census, it may be necessary for you, the patient, to continue your postoperative care in the Same Day Surgery department.  If you are being discharged the day of surgery, you will not be allowed to drive home. You will need a responsible individual to drive you home and stay with you for 24 hours after surgery.   If you are taking public  transportation, you will need to have a responsible individual with you.  Please call the Pre-admissions Testing Dept. at [phone] if you have any questions about these instructions.  Surgery Visitation Policy:  Patients having surgery or a procedure may have two visitors.  Children under the age of 12 must have an adult with them who is not the patient.  Inpatient Visitation:    Visiting hours are 7 a.m. to 8 p.m. Up to four visitors are allowed at one time in a patient room. The visitors may rotate out with other people during the day.  One visitor age 33 or older may stay with the patient overnight and must be in the room by 8 p.m.   Merchandiser, Retail to address health-related social needs:  [email or web address]                                                                                                            Preparing for Surgery with CHLORHEXIDINE  GLUCONATE (CHG) Soap  Chlorhexidine  Gluconate (CHG) Soap  o An antiseptic cleaner that kills germs and bonds with the skin to continue killing germs even after washing  o Used for showering the night before surgery and morning of surgery  Before surgery, you can play an important role by reducing the number of germs on your skin.  CHG (Chlorhexidine  gluconate) soap is an antiseptic cleanser which kills germs and bonds with the skin to continue killing germs even after washing.  Please do not use if you have an allergy to CHG or antibacterial soaps. If your skin becomes reddened/irritated stop using the CHG.  1. Shower the NIGHT BEFORE SURGERY with CHG soap.  2. If you choose to wash your hair, wash your hair first as usual with your normal shampoo.  3. After shampooing, rinse your hair and body thoroughly to remove the shampoo.  4. Use CHG as you would any other liquid soap. You can apply CHG directly to the skin and wash gently with a clean washcloth.  5. Apply the CHG soap to your body  only from the neck down. Do not use on open wounds or open sores. Avoid contact with your eyes, ears, mouth, and genitals (private parts). Wash face and genitals (private parts) with your normal soap.  6. Wash thoroughly, paying special attention to the area where your surgery will be performed.  7. Thoroughly rinse your body with warm water.  8. Do not shower/wash with your normal soap after using and rinsing off the CHG soap.  9. Do not use lotions,  oils, etc., after showering with CHG.  10. Pat yourself dry with a clean towel.  11. Wear clean pajamas to bed the night before surgery.  12. Place clean sheets on your bed the night of your shower and do not sleep with pets.  13. Do not apply any deodorants/lotions/powders.  14. Please wear clean clothes to the hospital.  15. Remember to brush your teeth with your regular toothpaste.

## 2024-07-05 ENCOUNTER — Other Ambulatory Visit: Payer: Self-pay | Admitting: Internal Medicine

## 2024-07-06 ENCOUNTER — Other Ambulatory Visit (INDEPENDENT_AMBULATORY_CARE_PROVIDER_SITE_OTHER): Payer: Self-pay

## 2024-07-06 ENCOUNTER — Ambulatory Visit: Admitting: Orthopedic Surgery

## 2024-07-06 ENCOUNTER — Other Ambulatory Visit: Payer: Self-pay

## 2024-07-06 ENCOUNTER — Encounter: Payer: Self-pay | Admitting: Podiatry

## 2024-07-06 DIAGNOSIS — M25531 Pain in right wrist: Secondary | ICD-10-CM

## 2024-07-06 DIAGNOSIS — M1811 Unilateral primary osteoarthritis of first carpometacarpal joint, right hand: Secondary | ICD-10-CM | POA: Diagnosis not present

## 2024-07-06 DIAGNOSIS — M25532 Pain in left wrist: Secondary | ICD-10-CM

## 2024-07-06 MED ORDER — BETAMETHASONE SOD PHOS & ACET 6 (3-3) MG/ML IJ SUSP
6.0000 mg | INTRAMUSCULAR | Status: AC | PRN
  Administered 2024-07-06: 6 mg via INTRA_ARTICULAR

## 2024-07-06 MED ORDER — LIDOCAINE HCL 1 % IJ SOLN
1.0000 mL | INTRAMUSCULAR | Status: AC | PRN
  Administered 2024-07-06: 1 mL
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
## 2024-07-08 ENCOUNTER — Telehealth (HOSPITAL_BASED_OUTPATIENT_CLINIC_OR_DEPARTMENT_OTHER): Payer: Self-pay

## 2024-07-08 ENCOUNTER — Encounter: Payer: Self-pay | Admitting: Podiatry

## 2024-07-08 MED ORDER — LACTATED RINGERS IV SOLN: INTRAVENOUS | Status: DC

## 2024-07-08 MED ORDER — ORAL CARE MOUTH RINSE: 15.0000 mL | Freq: Once | OROMUCOSAL | Status: AC

## 2024-07-08 MED ORDER — CHLORHEXIDINE GLUCONATE 0.12 % MT SOLN
15.0000 mL | Freq: Once | OROMUCOSAL | Status: AC
  Administered 2024-07-09: 15 mL via OROMUCOSAL

## 2024-07-08 MED ORDER — CEFAZOLIN SODIUM-DEXTROSE 2-4 GM/100ML-% IV SOLN
2.0000 g | INTRAVENOUS | Status: AC
  Administered 2024-07-09: 2 g via INTRAVENOUS

## 2024-07-08 NOTE — Telephone Encounter (Signed)
 Copied from CRM [id number]. Topic: Medical Record Request - Provider/Facility Request >> Jul 08, 2024  1:38 PM Nathanel DEL wrote: Reason for CRM: Amber Livingston w [facility] faxed surgical clearance on 11/04 and has not received back. Pt is scheduled for surgery tomorrow.  She is re faxing clearance now. Needs back asap.  [phone] Amber Livingston direct line

## 2024-07-09 ENCOUNTER — Ambulatory Visit: Payer: Self-pay | Admitting: Urgent Care

## 2024-07-09 ENCOUNTER — Encounter: Payer: Self-pay | Admitting: Podiatry

## 2024-07-09 ENCOUNTER — Other Ambulatory Visit: Payer: Self-pay

## 2024-07-09 ENCOUNTER — Encounter: Admission: RE | Disposition: A | Payer: Self-pay | Source: Home / Self Care | Attending: Podiatry

## 2024-07-09 ENCOUNTER — Ambulatory Visit

## 2024-07-09 ENCOUNTER — Ambulatory Visit
Admission: RE | Admit: 2024-07-09 | Discharge: 2024-07-09 | Disposition: A | Discharge status: Home / Self Care | Attending: Podiatry | Admitting: Podiatry

## 2024-07-09 DIAGNOSIS — J449 Chronic obstructive pulmonary disease, unspecified: Secondary | ICD-10-CM | POA: Diagnosis not present

## 2024-07-09 DIAGNOSIS — K219 Gastro-esophageal reflux disease without esophagitis: Secondary | ICD-10-CM | POA: Insufficient documentation

## 2024-07-09 DIAGNOSIS — M2011 Hallux valgus (acquired), right foot: Secondary | ICD-10-CM

## 2024-07-09 DIAGNOSIS — Z87891 Personal history of nicotine dependence: Secondary | ICD-10-CM | POA: Insufficient documentation

## 2024-07-09 DIAGNOSIS — M21611 Bunion of right foot: Secondary | ICD-10-CM | POA: Insufficient documentation

## 2024-07-09 DIAGNOSIS — G8929 Other chronic pain: Secondary | ICD-10-CM

## 2024-07-09 DIAGNOSIS — I10 Essential (primary) hypertension: Secondary | ICD-10-CM | POA: Diagnosis not present

## 2024-07-09 HISTORY — DX: Cannabis abuse, uncomplicated: F12.10

## 2024-07-09 HISTORY — PX: HALLUX VALGUS AUSTIN: SHX6623

## 2024-07-09 SURGERY — CORRECTION, HALLUX VALGUS
Anesthesia: General | Site: Foot | Laterality: Right

## 2024-07-09 MED ORDER — OXYCODONE HCL 5 MG PO TABS: 5.0000 mg | ORAL_TABLET | Freq: Once | ORAL | Status: DC | PRN

## 2024-07-09 MED ORDER — FENTANYL CITRATE (PF) 100 MCG/2ML IJ SOLN
INTRAMUSCULAR | Status: AC
  Filled 2024-07-09: qty 2

## 2024-07-09 MED ORDER — BUPIVACAINE LIPOSOME 1.3 % IJ SUSP
INTRAMUSCULAR | Status: DC | PRN
  Administered 2024-07-09: 10 mL

## 2024-07-09 MED ORDER — MIDAZOLAM HCL 2 MG/2ML IJ SOLN
INTRAMUSCULAR | Status: AC
  Filled 2024-07-09: qty 2

## 2024-07-09 MED ORDER — KETOROLAC TROMETHAMINE 30 MG/ML IJ SOLN
INTRAMUSCULAR | Status: AC
  Filled 2024-07-09: qty 1

## 2024-07-09 MED ORDER — PROPOFOL 1000 MG/100ML IV EMUL
INTRAVENOUS | Status: AC
  Filled 2024-07-09: qty 100

## 2024-07-09 MED ORDER — LIDOCAINE HCL (PF) 2 % IJ SOLN
INTRAMUSCULAR | Status: AC
  Filled 2024-07-09: qty 5

## 2024-07-09 MED ORDER — BUPIVACAINE HCL (PF) 0.5 % IJ SOLN
INTRAMUSCULAR | Status: AC
  Filled 2024-07-09: qty 30

## 2024-07-09 MED ORDER — OXYCODONE HCL 5 MG/5ML PO SOLN: 5.0000 mg | Freq: Once | ORAL | Status: DC | PRN

## 2024-07-09 MED ORDER — CEFAZOLIN SODIUM-DEXTROSE 2-4 GM/100ML-% IV SOLN
INTRAVENOUS | Status: AC
  Filled 2024-07-09: qty 100

## 2024-07-09 MED ORDER — FENTANYL CITRATE (PF) 100 MCG/2ML IJ SOLN: 25.0000 ug | INTRAMUSCULAR | Status: DC | PRN

## 2024-07-09 MED ORDER — FENTANYL CITRATE (PF) 100 MCG/2ML IJ SOLN
INTRAMUSCULAR | Status: DC | PRN
  Administered 2024-07-09: 50 ug via INTRAVENOUS

## 2024-07-09 MED ORDER — CHLORHEXIDINE GLUCONATE 0.12 % MT SOLN
OROMUCOSAL | Status: AC
  Filled 2024-07-09: qty 15

## 2024-07-09 MED ORDER — ACETAMINOPHEN 10 MG/ML IV SOLN
INTRAVENOUS | Status: DC | PRN
  Administered 2024-07-09: 1000 mg via INTRAVENOUS

## 2024-07-09 MED ORDER — DEXAMETHASONE SOD PHOSPHATE PF 10 MG/ML IJ SOLN
INTRAMUSCULAR | Status: DC | PRN
  Administered 2024-07-09: 10 mg via INTRAVENOUS

## 2024-07-09 MED ORDER — PROPOFOL 10 MG/ML IV BOLUS
INTRAVENOUS | Status: DC | PRN
  Administered 2024-07-09: 150 mg via INTRAVENOUS
  Administered 2024-07-09: 150 ug/kg/min via INTRAVENOUS

## 2024-07-09 MED ORDER — MIDAZOLAM HCL (PF) 2 MG/2ML IJ SOLN
INTRAMUSCULAR | Status: DC | PRN
  Administered 2024-07-09: 2 mg via INTRAVENOUS

## 2024-07-09 MED ORDER — OXYCODONE HCL 5 MG PO TABS
5.0000 mg | ORAL_TABLET | ORAL | 0 refills | Status: AC | PRN
Start: 2024-07-09 — End: 2024-07-16

## 2024-07-09 MED ORDER — ACETAMINOPHEN 10 MG/ML IV SOLN
INTRAVENOUS | Status: AC
  Filled 2024-07-09: qty 100

## 2024-07-09 MED ORDER — BUPIVACAINE LIPOSOME 1.3 % IJ SUSP
INTRAMUSCULAR | Status: AC
  Filled 2024-07-09: qty 10

## 2024-07-09 MED ORDER — IBUPROFEN 600 MG PO TABS: 600.0000 mg | ORAL_TABLET | Freq: Four times a day (QID) | ORAL | 0 refills | Status: DC | PRN

## 2024-07-09 MED ORDER — LIDOCAINE HCL (CARDIAC) PF 100 MG/5ML IV SOSY
PREFILLED_SYRINGE | INTRAVENOUS | Status: DC | PRN
  Administered 2024-07-09: 100 mg via INTRAVENOUS

## 2024-07-09 MED ORDER — SODIUM CHLORIDE 0.9 % IR SOLN
Status: DC | PRN
  Administered 2024-07-09: 500 mL

## 2024-07-09 MED ORDER — KETOROLAC TROMETHAMINE 30 MG/ML IJ SOLN
INTRAMUSCULAR | Status: DC | PRN
  Administered 2024-07-09: 30 mg via INTRAVENOUS

## 2024-07-09 MED ORDER — ACETAMINOPHEN 500 MG PO TABS: 1000.0000 mg | ORAL_TABLET | Freq: Four times a day (QID) | ORAL | 0 refills | Status: AC | PRN

## 2024-07-09 MED ORDER — BUPIVACAINE HCL (PF) 0.5 % IJ SOLN
INTRAMUSCULAR | Status: DC | PRN
  Administered 2024-07-09: 20 mL

## 2024-07-09 SURGICAL SUPPLY — 39 items
BIT DRILL 2 STRT CANN (BIT) IMPLANT
BIT DRILL CANN 2.9 (BIT) IMPLANT
BIT DRILL CANN 3.6 (BIT) IMPLANT
BLADE OSC/SAGITTAL MD 5.5X18 (BLADE) IMPLANT
BLADE SURG 15 STRL LF DISP TIS (BLADE) ×2 IMPLANT
BLADE SW THK.38XMED LNG THN (BLADE) IMPLANT
BNDG COHESIVE 4X5 TAN STRL LF (GAUZE/BANDAGES/DRESSINGS) ×1 IMPLANT
BNDG ESMARCH 4X12 STRL LF (GAUZE/BANDAGES/DRESSINGS) ×1 IMPLANT
BNDG STRETCH GAUZE 3IN X12FT (GAUZE/BANDAGES/DRESSINGS) ×1 IMPLANT
BUR MIS STRT 2.0X13 (BURR) IMPLANT
BUR MIS STRT 2.0X19.5 (BURR) IMPLANT
CHLORAPREP W/TINT 26 (MISCELLANEOUS) ×1 IMPLANT
CUFF TOURN SGL QUICK 18X4 (TOURNIQUET CUFF) ×1 IMPLANT
DRAPE FLUOR MINI C-ARM 54X84 (DRAPES) ×1 IMPLANT
DRAPE SURG IRRIG POUCH 19X23 (DRAPES) IMPLANT
ELECTRODE REM PT RTRN 9FT ADLT (ELECTROSURGICAL) ×1 IMPLANT
GAUZE PAD ABD 8X10 STRL (GAUZE/BANDAGES/DRESSINGS) ×1 IMPLANT
GAUZE SPONGE 4X4 12PLY STRL (GAUZE/BANDAGES/DRESSINGS) ×1 IMPLANT
GLOVE PI ORTHO PRO STRL 7.5 (GLOVE) ×1 IMPLANT
GOWN STRL REUS W/ TWL LRG LVL3 (GOWN DISPOSABLE) ×1 IMPLANT
GUIDEWIRE 0.86MM (WIRE) IMPLANT
GUIDEWIRE 1.6X150 (WIRE) IMPLANT
GUIDEWIRE BEVELED FT 1.4X3.5 (WIRE) IMPLANT
GUIDEWIRE BEVELED FT 1.6X4 (WIRE) IMPLANT
KIT TURNOVER KIT A (KITS) ×1 IMPLANT
PACK EXTREMITY ARMC (MISCELLANEOUS) ×1 IMPLANT
PAD PREP OB/GYN DISP 24X41 (PERSONAL CARE ITEMS) ×1 IMPLANT
PENCIL SMOKE EVACUATOR (MISCELLANEOUS) ×2 IMPLANT
SCREW BEVELED 3.5X34 (Screw) IMPLANT
SCREW BEVELED FT NSTRL 4.0X50 (Screw) IMPLANT
SCREW COMPRESSION 2.5X24 (Screw) IMPLANT
SET IRRIGATION TUBING (TUBING) IMPLANT
SLEEVE SCD COMPRESS KNEE MED (STOCKING) ×1 IMPLANT
SPRAY CLIP F DRILL SAW (MISCELLANEOUS) IMPLANT
STOCKINETTE ORTHO 6X25 (MISCELLANEOUS) ×1 IMPLANT
SUT MNCRL AB 3-0 PS2 27 (SUTURE) ×1 IMPLANT
SUT STRATA 4-0 30 PS-2 (SUTURE) IMPLANT
SUTURE ETHLN 4-0 FS2 18XMF BLK (SUTURE) IMPLANT
SYR CONTROL 10ML LL (SYRINGE) ×1 IMPLANT

## 2024-07-09 NOTE — Telephone Encounter (Signed)
 Copy of this encounter faxed to [facility] [phone]

## 2024-07-09 NOTE — Anesthesia Procedure Notes (Signed)
 Procedure Name: LMA Insertion Date/Time: 07/09/2024 1:33 PM  Performed by: Bonnetta Jimmey SAUNDERS, CRNAPre-anesthesia Checklist: Patient identified, Emergency Drugs available, Suction available, Patient being monitored and Timeout performed Patient Re-evaluated:Patient Re-evaluated prior to induction Oxygen Delivery Method: Circle system utilized Preoxygenation: Pre-oxygenation with 100% oxygen Induction Type: IV induction LMA: LMA inserted LMA Size: 3.0 Number of attempts: 1 Placement Confirmation: positive ETCO2 and breath sounds checked- equal and bilateral Tube secured with: Tape Dental Injury: Teeth and Oropharynx as per pre-operative assessment

## 2024-07-09 NOTE — H&P (Signed)
 History and Physical Interval Note:  07/09/2024 1:02 PM  Amber Livingston  has presented today for surgery, with the diagnosis of painful bunion.  The various methods of treatment have been discussed with the patient and family. After consideration of risks, benefits and other options for treatment, the patient has consented to   Procedure(s) with comments: CORRECTION, HALLUX VALGUS (Right) - bunionectomy with sesamoidectomy when performed with double osteotomy  as a surgical intervention.  The patient's history has been reviewed, patient examined, no change in status, stable for surgery.  I have reviewed the patient's chart and labs.  Questions were answered to the patient's satisfaction.     [name] Medicine

## 2024-07-09 NOTE — Telephone Encounter (Signed)
 Fax received from Dr. Juliene Medicine with  to perform a double ostomy of right foot under general anesthesia on patient.  Patient needs surgery clearance. Surgery is .today. Patient was seen on 05/31/24. Office protocol is a risk assessment can be sent to surgeon if patient has been seen in 60 days or less.   Sending to Dr Kassie for risk assessment or recommendations if patient needs to be seen in office prior to surgical procedure.

## 2024-07-09 NOTE — Telephone Encounter (Signed)
 Peri-operative Assessment of Pulmonary Risk for Non-Thoracic Surgery: Right foot double osteotomy bunionectomy   For Ms. Cibrian, risk of perioperative pulmonary complications is increased by:  [ ] Age greater than 65 years  [X]  COPD  [ ] Serum albumin <3.5  [ ] Smoking  [ ] Obstructive sleep apnea  [ ] NYHA Class II Pulmonary Hypertension  ARISCAT: Low risk 1.6% risk of in-hospital post-op pulmonary complications (composite including respiratory failure, respiratory infection, pleural effusion, atelectasis, pneumothorax, bronchospasm, aspiration pneumonitis)  Respiratory complications generally occur in 1% of ASA Class I patients, 5% of ASA Class II and 10% of ASA Class III-IV patients These complications rarely result in mortality and include postoperative pneumonia, atelectasis, pulmonary embolism, ARDS and increased time requiring postoperative mechanical ventilation.  RISK FOR PROLONGED MECHANICAL VENTILAION - > 48h   1A) Arozullah - Prolonged mech ventilation risk Arozullah Postperative Pulmonary Risk Score - for mech ventilation dependence >48h [place], Ann Surg 2000, major non-cardiac surgery) Comment Score  Type of surgery - abd ao aneurysm (27), thoracic (21), neurosurgery / upper abdominal / vascular (21), neck (11)    Emergency Surgery - (11)     ALbumin < 3 or poor nutritional state - (9)     BUN > 30 -  (8)    Partial or completely dependent functional status - (7)     COPD -  (6)  Yes 6  Age - 60 to 62 (4), > 70  (6) Yes 4  TOTAL     Risk Stratifcation scores  - < 10 (0.5%), 11-19 (1.8%), 20-27 (4.2%), 28-40 (10.1%), >40 (26.6%)  0.5% risk (LOW) 10    Assessment/Plan LOW RISK for peri and postoperative pulmonary complications for Right foot double osteotomy bunionectomy   Overall, I recommend proceeding with the surgery if the risk for respiratory complications are outweighed by the potential benefits. This will need to be discussed between the patient and  surgeon.  To reduce risks of respiratory complications, I recommend: --Pre- and post-operative incentive spirometry performed frequently while awake --Avoiding/minimizing use of paralytics if possible during anesthesia.  Slater Staff, M.D. [facility] Pulmonary/Critical Care Medicine 07/09/2024 8:55 AM

## 2024-07-09 NOTE — Op Note (Signed)
 Patient Name: Amber Livingston DOB: 01/04/1961  MRN: [id number]   Date of Service: 07/09/2024  Surgeon: Dr. Juliene Medicine, DPM Assistants: None Pre-operative Diagnosis:  Hallux valgus with bunions right foot Post-operative Diagnosis:  Hallux valgus with bunions right foot Procedures: Correction of hallux valgus right foot with double osteotomy Pathology/Specimens: * No specimens in log * Anesthesia: General Hemostasis:  Total Tourniquet Time Documented: Calf (Right) - 43 minutes Total: Calf (Right) - 43 minutes  Estimated Blood Loss: 5 cc Materials:  Implant Name Type Inv. Item Serial No. Manufacturer Lot No. LRB No. Used Action  SCREW BEVELED FT NSTRL 4.0X50 - [id number] Screw SCREW BEVELED FT NSTRL 4.0X50  [employer]  Right 1 Implanted  SCREW BEVELED 3.5X34 - [id number] Screw SCREW BEVELED 3.5X34  [employer]  Right 1 Implanted  SCREW COMPRESSION 2.5X24 - [id number] Screw SCREW COMPRESSION 2.5X24  [employer]  Right 1 Implanted   Medications: 20 cc 0.5% Marcaine  plain mixed with 10 cc Exparel  for ankle block Complications: No complications  Indications for Procedure:  This is a 63 y.o. female with a history of painful hallux valgus deformity, she did not improve with nonoperative treatment elected for operative intervention. All risks, benefits and potential complications discussed prior to the procedure. All questions addressed. Informed consent signed and reviewed.      Procedure in Detail:   After identifying the patient in the pre-operative holding area and marking the right lower extremity as the correct side and site of surgery, the patient was brought to the operating room and transferred from the stretcher to the operating room table and placed in the supine position.  A safety belt was placed and secured around waist.  A timeout was then performed.  The patient was then sedated with IV sedation by the anesthesia team.  A local field block was then performed with 10 cc of  Exparel  and 20 cc 0.5% Bupivacaine .  The right lower extremity was then prepped and draped in the usual sterile fashion.  The right lower extremity was elevated, exsanguinated and the pneumatic calf tourniquet was inflated to 250 mmHg.    The fluoroscope was used to locate the distal metaphyseal/diaphyseal junction of the first metatarsal. An incision was made and the elevator was used to raise the soft tissues over the dorsal metatarsal. The cutting burr was used to create a distal metatarsal osteotomy with axis directed slightly distal and plantar. The osteotomy was completed, the shifting device was inserted into the proximal canal and the capital fragment secured with a K wire. The capital fragment was translated laterally to an appropriate level of correction. Guidewires for the 3.58mm and 4.23mm screws were then placed in parallel fashion. The screw lengths were measured after a small incision was made, the screws were then drilled and inserted with good stability noted.  A small incision was then made over the distal portion of the medial proximal phalanx. The soft tissues were elevated dorsally and plantarly. An Akin osteotomy was created utilizing the burr. The osteotomy was reduced and a guidewire was placed from distal lateral to proximal medial across the osteotomy. The screw was measured, drilled, and inserted with good compression and stability noted.   The medial eminence and bunion was then resected using the wedge burr, rongeur and rasped smooth.   Once an appropriate radiographic and clinical result was achieved the incisions were thoroughly irrigated and rasped of all bony debris with copious amounts of normal sterile saline. Closure was then initiated and consisted of 4-0  nylon sutures. A postop dressing was applied consisting of xeroform, 4x4 gauze, Kerlix and Ace wrap was applied under light compression with the hallux in a slight frontal varus and neutral transverse position.  The patient  tolerated the procedure well.  The tourniquet was then deflated.  Immediate capillary refill was noted to all toes.  She was aroused from sedation and transferred back to the stretcher and to the post anesthesia care unit for further monitoring.  She was discharged to home in good condition, and will be weightbearing as tolerated in a cam walker boot. She will follow up with me in 1 week for postop evaluation.   Disposition: Following a period of post-operative monitoring, patient will be transferred to home.

## 2024-07-09 NOTE — Discharge Instructions (Signed)
 Post-Surgery Instructions  1. If you are recuperating from surgery anywhere other than home, please be sure to leave us  a number where you can be reached. 2. Go directly home and rest. 3. The keep operated foot (or feet) elevated six inches above the hip when sitting or lying down. 4. Support the elevated foot and leg with pillows under the calf. DO NOT PLACE PILLOWS UNDER THE KNEE. 5. DO NOT REMOVE or get your bandages wet. This will increase your chances of getting an infection. 6. Wear your surgical shoe at all times when you are up. 7. A limited amount of pain and swelling may occur. The skin may take on a bruised appearance. This is no cause for alarm. 8. For slight pain and swelling, apply an ice pack directly over the bandage for 15 minutes every hour. Continue icing until seen in the office. DO NOT apply any form of heat to the area. 9. Have prescription(s) filled immediately and take as directed. 10. Drink lots of liquids, water, and juice. 11. CALL THE OFFICE IMMEDIATELY IF: a. Bleeding continues b. Pain increases and/or does not respond to medication c. Bandage or cast appears too tight d. Any liquids (water, coffee, etc.) have spilled on your bandages. e. Tripping, falling, or stubbing the surgical foot f. If your temperature rises above 101 g. If you have ANY questions at all 12. Please use the crutches, knee scooter, or walker you have prescribed, rented, or purchased. If you are non-weight bearing DO NOT put weight on the operated foot for _________ days. If you are weight-bearing, follow your physician's instructions. You are expected to be: ? weight-bearing  13. Special Instructions: _____________________________________________________________ _________________________________________________________________________________ _________________________________________________________________________________  14. Your next appointment is:  07/14/2024 12:30 PM       If you need to reach the nurse for any reason, please call: [place]: [phone] [place]: [phone] [place]: [phone]

## 2024-07-09 NOTE — Transfer of Care (Signed)
 Immediate Anesthesia Transfer of Care Note  Patient: Amber Livingston  Procedure(s) Performed: CORRECTION, HALLUX VALGUS (Right: Foot)  Patient Location: PACU  Anesthesia Type:General  Level of Consciousness: awake  Airway & Oxygen Therapy: Patient Spontanous Breathing  Post-op Assessment: Report given to RN and Post -op Vital signs reviewed and stable  Post vital signs: Reviewed  Last Vitals:  Vitals Value Taken Time  BP 158/62 07/09/24 15:00  Temp    Pulse 65 07/09/24 15:01  Resp 11 07/09/24 15:01  SpO2 98 % 07/09/24 15:01  Vitals shown include unfiled device data.  Last Pain:         Complications: There were no known notable events for this encounter.
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
## 2024-07-11 NOTE — Anesthesia Postprocedure Evaluation (Signed)
 Anesthesia Post Note  Patient: Amber Livingston  Procedure(s) Performed: CORRECTION, HALLUX VALGUS (Right: Foot)  Patient location during evaluation: PACU Anesthesia Type: General Level of consciousness: awake and alert Pain management: pain level controlled Vital Signs Assessment: post-procedure vital signs reviewed and stable Respiratory status: spontaneous breathing, nonlabored ventilation, respiratory function stable and patient connected to nasal cannula oxygen Cardiovascular status: blood pressure returned to baseline and stable Postop Assessment: no apparent nausea or vomiting Anesthetic complications: no   There were no known notable events for this encounter.   Last Vitals:  Vitals:   07/09/24 1530 07/09/24 1558  BP: 122/69 125/70  Pulse: (!) 59 64  Resp: 20 15  Temp: (!) 36.1 C 36.6 C  SpO2: 98% 100%    Last Pain:  Vitals:   07/09/24 1558  TempSrc: Temporal  PainSc: 0-No pain                 Debby Mines

## 2024-07-12 ENCOUNTER — Encounter: Payer: Self-pay | Admitting: Podiatry

## 2024-07-13 ENCOUNTER — Ambulatory Visit: Admitting: Pediatrics

## 2024-07-13 ENCOUNTER — Encounter: Payer: Self-pay | Admitting: Pediatrics

## 2024-07-13 VITALS — BP 115/78 | HR 66 | Temp 97.9°F | Ht 67.99 in | Wt 122.4 lb

## 2024-07-13 DIAGNOSIS — M2011 Hallux valgus (acquired), right foot: Secondary | ICD-10-CM | POA: Diagnosis not present

## 2024-07-13 DIAGNOSIS — M797 Fibromyalgia: Secondary | ICD-10-CM | POA: Diagnosis not present

## 2024-07-13 MED ORDER — DULOXETINE HCL 20 MG PO CPEP
20.0000 mg | ORAL_CAPSULE | Freq: Two times a day (BID) | ORAL | 1 refills | Status: AC
Start: 2024-07-13 — End: ?

## 2024-07-13 MED ORDER — PREGABALIN 25 MG PO CAPS: 25.0000 mg | ORAL_CAPSULE | Freq: Every day | ORAL | 2 refills | Status: AC

## 2024-07-13 NOTE — Progress Notes (Signed)
 Office Visit  BP 115/78 (BP Location: Left Arm, Patient Position: Sitting)   Pulse 66   Temp 97.9 F (36.6 C) (Oral)   Ht 5' 7.99 (1.727 m)   Wt 122 lb 6.4 oz (55.5 kg)   SpO2 98%   BMI 18.62 kg/m    Subjective:    Patient ID: Amber Livingston, female    DOB: 04-13-1961, 63 y.o.   MRN: [id number]  HPI: Amber Livingston is a 63 y.o. female  Chief Complaint  Patient presents with   chronic illness    4-week F/u    Discussed the use of AI scribe software for clinical note transcription with the patient, who gave verbal consent to proceed.  History of Present Illness   Amber Livingston is a 63 year old female who presents for follow-up after recent hand surgery.  She underwent foot surgery on Friday. Initially, she experienced significant pain post-surgery, requiring four doses of pain medication on Saturday after coming off anesthesia. By "Sunday, she reduced her intake to two doses, and as of today, she has not taken any pain medication.  She manages her medications by alternating between Cymbalta and Lyrica based on her symptoms and mental state. Currently, she takes 20 mg of Cymbalta twice daily, in the morning and around 4 PM, and sometimes substitutes Lyrica for the antidepressants if she feels well.  She recently received new glasses, which have significantly improved her vision. Previously, she used 2.50 reading glasses, but with the new prescription, she is able to see clearly for the first time in a while.  She is actively moving around her home, including in the kitchen, and has tested her ability to navigate her driveway, which has a slight slope. She is satisfied with her current level of mobility and independence.  She reports a recent episode of swollen lymph nodes, which was a new experience for her. She was able to see a healthcare provider promptly, who addressed her concerns thoroughly.      Relevant past medical, surgical, family and social history reviewed and updated as  indicated. Interim medical history since our last visit reviewed. Allergies and medications reviewed and updated.  ROS per HPI unless specifically indicated above     Objective:    BP 115/78 (BP Location: Left Arm, Patient Position: Sitting)   Pulse 66   Temp 97.9 F (36.6 C) (Oral)   Ht 5' 7.99 (1.727 m)   Wt 122 lb 6.4 oz (55.5 kg)   SpO2 98%   BMI 18.62 kg/m   Wt Readings from Last 3 Encounters:  07/13/24 122 lb 6.4 oz (55.5 kg)  07/09/24 116 lb 12.8 oz (53 kg)  07/01/24 121 lb 9.6 oz (55.2 kg)     Physical Exam Constitutional:      Appearance: Normal appearance.  Pulmonary:     Effort: Pulmonary effort is normal.  Musculoskeletal:        General: Normal range of motion.  Skin:    Comments: Normal skin color  Neurological:     General: No focal deficit present.     Mental Status: She is alert. Mental status is at baseline.  Psychiatric:        Mood and Affect: Mood normal.        Behavior: Behavior normal.        Thought Content: Thought content normal.         11" /13/2025    1:09 PM 08/27/2023   10:59 AM 06/18/2023    2:38 PM  03/07/2023    1:16 PM 12/25/2022    2:52 PM  Depression screen PHQ 2/9  Decreased Interest 0 3 0 2 3  Down, Depressed, Hopeless 0 3 0 1 1  PHQ - 2 Score 0 6 0 3 4  Altered sleeping 0 3 0 1 3  Tired, decreased energy 0 3 0 3 3  Change in appetite 0 2 0 2 3  Feeling bad or failure about yourself  0 0 0 0 0  Trouble concentrating 0 2 0 1 3  Moving slowly or fidgety/restless 0 3 0 1 0  Suicidal thoughts 0 0 0 0 0  PHQ-9 Score 0 19  0  11  16   Difficult doing work/chores  Somewhat difficult Not difficult at all Somewhat difficult Extremely dIfficult     Data saved with a previous flowsheet row definition       07/01/2024    1:09 PM 08/27/2023   11:02 AM 06/18/2023    2:38 PM 03/07/2023    1:16 PM  GAD 7 : Generalized Anxiety Score  Nervous, Anxious, on Edge 0 3 0 1  Control/stop worrying 0 3 0 1  Worry too much - different  things 0 3 0 1  Trouble relaxing 0 3 0 1  Restless 0 3 0 2  Easily annoyed or irritable 0 3 0 1  Afraid - awful might happen 0 3 0 0  Total GAD 7 Score 0 21 0 7  Anxiety Difficulty  Somewhat difficult Not difficult at all Somewhat difficult       Assessment & Plan:  Assessment & Plan   Acquired hallux valgus, right Recovery progressing well with effective pain management and improved mobility. Has prn oxy for breaktrhough pain. Has post op with surgeon tomorrow. Will let me know if needs referral for PT.  Fibromyalgia Based on sx using cymbalta  20mg -40mg  depending on symptoms and lyrica  25mg . Will let me know if needs adjustments after opioid med runs out.     Follow up plan: Return in about 3 months (around 10/13/2024).  Hadassah SHAUNNA Nett, MD

## 2024-07-14 ENCOUNTER — Ambulatory Visit (INDEPENDENT_AMBULATORY_CARE_PROVIDER_SITE_OTHER): Admitting: Podiatry

## 2024-07-14 ENCOUNTER — Ambulatory Visit

## 2024-07-14 ENCOUNTER — Encounter: Payer: Self-pay | Admitting: Podiatry

## 2024-07-14 DIAGNOSIS — M2011 Hallux valgus (acquired), right foot: Secondary | ICD-10-CM

## 2024-07-14 DIAGNOSIS — M21611 Bunion of right foot: Secondary | ICD-10-CM | POA: Diagnosis not present

## 2024-07-14 NOTE — Progress Notes (Signed)
  Subjective:  Patient ID: Stephane Angle, female    DOB: 03-Jul-1961,  MRN: [id number]  Chief Complaint  Patient presents with   Routine Post Op    POV #1 DOS 07/09/2024 RT DOUBLE OSTEOTOMY. She complains of itching only. No pain or swelling      63 y.o. female returns for post-op check.    Review of Systems: Negative except as noted in the HPI. Denies N/V/F/Ch.   Objective:  There were no vitals filed for this visit. There is no height or weight on file to calculate BMI. Constitutional Well developed. Well nourished.  Vascular Foot warm and well perfused. Capillary refill normal to all digits.  Calf is soft and supple, no posterior calf or knee pain, negative Homans' sign  Neurologic Normal speech. Oriented to person, place, and time. Epicritic sensation to light touch grossly present bilaterally.  Dermatologic Skin healing well without signs of infection. Skin edges well coapted without signs of infection.  Orthopedic: Tenderness to palpation noted about the surgical site.   Multiple view plain film radiographs: Good correction noted hardware intact and in position Assessment:   1. Hallux valgus with bunions, right    Plan:  Patient was evaluated and treated and all questions answered.  S/p foot surgery right -Progressing as expected post-operatively. -XR: Noted above no issues -WB Status: Weightbearing as tolerated in cam walker boot -Sutures: Return in 2 weeks to remove. -Medications: No refills needed -Foot redressed.  No follow-ups on file.

## 2024-07-26 ENCOUNTER — Other Ambulatory Visit: Payer: Self-pay | Admitting: Podiatry

## 2024-07-26 DIAGNOSIS — G8929 Other chronic pain: Secondary | ICD-10-CM

## 2024-07-28 ENCOUNTER — Other Ambulatory Visit: Payer: Self-pay

## 2024-07-28 ENCOUNTER — Ambulatory Visit: Admitting: Obstetrics and Gynecology

## 2024-07-28 ENCOUNTER — Encounter: Payer: Self-pay | Admitting: Podiatry

## 2024-07-28 ENCOUNTER — Telehealth: Payer: Self-pay | Admitting: Family Medicine

## 2024-07-28 ENCOUNTER — Ambulatory Visit: Admitting: Podiatry

## 2024-07-28 VITALS — Ht 67.99 in | Wt 122.4 lb

## 2024-07-28 DIAGNOSIS — M2011 Hallux valgus (acquired), right foot: Secondary | ICD-10-CM

## 2024-07-28 DIAGNOSIS — M21611 Bunion of right foot: Secondary | ICD-10-CM

## 2024-07-28 MED ORDER — OXYCODONE HCL 5 MG PO TABS: 5.0000 mg | ORAL_TABLET | Freq: Three times a day (TID) | ORAL | 0 refills | Status: AC | PRN

## 2024-07-28 NOTE — Progress Notes (Signed)
°  Subjective:  Patient ID: Amber Livingston, female    DOB: 01-May-1961,  MRN: [id number]  Chief Complaint  Patient presents with   Post-op Follow-up    Rm 9 POV #2 DOS 07/09/2024 RT DOUBLE OSTEOTOMY. Pt states severe pain in right hallux, requesting pain medication. Surgical site is healing well and all sutures are intact.      63 y.o. female returns for post-op check.    Review of Systems: Negative except as noted in the HPI. Denies N/V/F/Ch.   Objective:  There were no vitals filed for this visit. Body mass index is 18.62 kg/m. Constitutional Well developed. Well nourished.  Vascular Foot warm and well perfused. Capillary refill normal to all digits.  Calf is soft and supple, no posterior calf or knee pain, negative Homans' sign  Neurologic Normal speech. Oriented to person, place, and time. Epicritic sensation to light touch grossly present bilaterally.  Dermatologic Skin healing well without signs of infection. Skin edges well coapted without signs of infection.  Orthopedic: Tenderness to palpation noted about the surgical site.   Multiple view plain film radiographs: Good correction noted hardware intact and in position Assessment:   1. Hallux valgus with bunions, right    Plan:  Patient was evaluated and treated and all questions answered.  S/p foot surgery right - Sutures removed uneventfully she made weightbearing as tolerated in cam walker boot.  Still having some pain I refilled her pain medication to take only as needed.  Compression sleeve applied and utilize this as well as range of motion of the MTP joint.  Follow-up in 3 weeks for new radiographs.  No follow-ups on file.

## 2024-07-28 NOTE — Telephone Encounter (Signed)
 Patient was scheduled for a New GYN at our office today. However, the patient was not able to wait and decided to cancel her appointment because she had someone waiting for her outside. Patient will call [place] office to get scheduled there since its closer to her home address.

## 2024-07-29 ENCOUNTER — Ambulatory Visit: Admitting: Pediatrics

## 2024-08-04 ENCOUNTER — Ambulatory Visit: Admitting: Pediatrics

## 2024-08-05 NOTE — Progress Notes (Signed)
 Patient cancelled visit after checking in

## 2024-08-16 ENCOUNTER — Other Ambulatory Visit: Payer: Self-pay | Admitting: Podiatry

## 2024-08-16 DIAGNOSIS — G8929 Other chronic pain: Secondary | ICD-10-CM

## 2024-08-18 ENCOUNTER — Ambulatory Visit (INDEPENDENT_AMBULATORY_CARE_PROVIDER_SITE_OTHER): Admitting: Podiatry

## 2024-08-18 ENCOUNTER — Ambulatory Visit (INDEPENDENT_AMBULATORY_CARE_PROVIDER_SITE_OTHER)

## 2024-08-18 VITALS — Ht 67.99 in | Wt 122.4 lb

## 2024-08-18 DIAGNOSIS — M21611 Bunion of right foot: Secondary | ICD-10-CM | POA: Diagnosis not present

## 2024-08-18 DIAGNOSIS — M2011 Hallux valgus (acquired), right foot: Secondary | ICD-10-CM | POA: Diagnosis not present

## 2024-08-18 NOTE — Progress Notes (Signed)
"   °  Subjective:  Patient ID: Amber Livingston, female    DOB: 1960-10-31,  MRN: [id number]  Chief Complaint  Patient presents with   Post-op Follow-up    RM 3 POV #3 DOS 07/09/2024 RT DOUBLE OSTEOTOMY. Pt states suture site is tender to the touch.       62 y.o. female returns for post-op check.    Review of Systems: Negative except as noted in the HPI. Denies N/V/F/Ch.   Objective:  There were no vitals filed for this visit. Body mass index is 18.62 kg/m. Constitutional Well developed. Well nourished.  Vascular Foot warm and well perfused. Capillary refill normal to all digits.  Calf is soft and supple, no posterior calf or knee pain, negative Homans' sign  Neurologic Normal speech. Oriented to person, place, and time. Epicritic sensation to light touch grossly present bilaterally.  Dermatologic Incisions well-healed with minimal hypertrophy  Orthopedic: Mild edema and tenderness to palpation noted about the surgical site.   Multiple view plain film radiographs: Good correction noted hardware intact and in position, alignment is maintained, does not have significant bone callus formation yet but there is good healing across the Akin site Assessment:   1. Hallux valgus with bunions, right    Plan:  Patient was evaluated and treated and all questions answered.  S/p foot surgery right Has improved may begin gradual transition to surgical shoe.  This was dispensed today.  Follow-up in 6 weeks for new x-rays.  Okay to begin driving with shoe only.  OTC Tylenol  and ibuprofen  alternating for pain control at this point.  No follow-ups on file.  "

## 2024-08-24 ENCOUNTER — Telehealth: Payer: Self-pay | Admitting: Lab

## 2024-08-24 MED ORDER — TRAMADOL HCL 50 MG PO TABS: 50.0000 mg | ORAL_TABLET | Freq: Two times a day (BID) | ORAL | 0 refills | Status: AC | PRN

## 2024-08-24 NOTE — Telephone Encounter (Signed)
 Spoke to patient discussed medication hs been sent.

## 2024-08-24 NOTE — Telephone Encounter (Signed)
 Patient states having severe top pain feels like toes are joining together unable to get any sleep keeping her up all night has been taking a lot of ibuprofen  needs something stronger please advise.

## 2024-08-26 ENCOUNTER — Telehealth: Payer: Self-pay | Admitting: Podiatry

## 2024-08-26 NOTE — Telephone Encounter (Signed)
 As per patient, Medicaid will not pay for Tramadol , patient is requesting provider prescribe a different medication

## 2024-08-26 NOTE — Telephone Encounter (Incomplete)
 SABRA

## 2024-08-27 ENCOUNTER — Telehealth: Payer: Self-pay
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
## 2024-08-27 NOTE — Patient Instructions (Signed)
 Preventive Care 66-64 Years Old, Female Preventive care refers to lifestyle choices and visits with your health care provider that can promote health and wellness. Preventive care visits are also called wellness exams. What can I expect for my preventive care visit? Counseling Your health care provider may ask you questions about your: Medical history, including: Past medical problems. Family medical history. Pregnancy history. Current health, including: Menstrual cycle. Method of birth control. Emotional well-being. Home life and relationship well-being. Sexual activity and sexual health. Lifestyle, including: Alcohol, nicotine or tobacco, and drug use. Access to firearms. Diet, exercise, and sleep habits. Work and work Astronomer. Sunscreen use. Safety issues such as seatbelt and bike helmet use. Physical exam Your health care provider will check your: Height and weight. These may be used to calculate your BMI (body mass index). BMI is a measurement that tells if you are at a healthy weight. Waist circumference. This measures the distance around your waistline. This measurement also tells if you are at a healthy weight and may help predict your risk of certain diseases, such as type 2 diabetes and high blood pressure. Heart rate and blood pressure. Body temperature. Skin for abnormal spots. What immunizations do I need?  Vaccines are usually given at various ages, according to a schedule. Your health care provider will recommend vaccines for you based on your age, medical history, and lifestyle or other factors, such as travel or where you work. What tests do I need? Screening Your health care provider may recommend screening tests for certain conditions. This may include: Lipid and cholesterol levels. Diabetes screening. This is done by checking your blood sugar (glucose) after you have not eaten for a while (fasting). Pelvic exam and Pap test. Hepatitis B test. Hepatitis C  test. HIV (human immunodeficiency virus) test. STI (sexually transmitted infection) testing, if you are at risk. Lung cancer screening. Colorectal cancer screening. Mammogram. Talk with your health care provider about when you should start having regular mammograms. This may depend on whether you have a family history of breast cancer. BRCA-related cancer screening. This may be done if you have a family history of breast, ovarian, tubal, or peritoneal cancers. Bone density scan. This is done to screen for osteoporosis. Talk with your health care provider about your test results, treatment options, and if necessary, the need for more tests. Follow these instructions at home: Eating and drinking  Eat a diet that includes fresh fruits and vegetables, whole grains, lean protein, and low-fat dairy products. Take vitamin and mineral supplements as recommended by your health care provider. Do not drink alcohol if: Your health care provider tells you not to drink. You are pregnant, may be pregnant, or are planning to become pregnant. If you drink alcohol: Limit how much you have to 0-1 drink a day. Know how much alcohol is in your drink. In the [place] one drink equals one 12 oz bottle of beer (355 mL), one 5 oz glass of wine (148 mL), or one 1 oz glass of hard liquor (44 mL). Lifestyle Brush your teeth every morning and night with fluoride toothpaste. Floss one time each day. Exercise for at least 30 minutes 5 or more days each week. Do not use any products that contain nicotine or tobacco. These products include cigarettes, chewing tobacco, and vaping devices, such as e-cigarettes. If you need help quitting, ask your health care provider. Do not use drugs. If you are sexually active, practice safe sex. Use a condom or other form of protection to  prevent STIs. If you do not wish to become pregnant, use a form of birth control. If you plan to become pregnant, see your health care provider for a  prepregnancy visit. Take aspirin only as told by your health care provider. Make sure that you understand how much to take and what form to take. Work with your health care provider to find out whether it is safe and beneficial for you to take aspirin daily. Find healthy ways to manage stress, such as: Meditation, yoga, or listening to music. Journaling. Talking to a trusted person. Spending time with friends and family. Minimize exposure to UV radiation to reduce your risk of skin cancer. Safety Always wear your seat belt while driving or riding in a vehicle. Do not drive: If you have been drinking alcohol. Do not ride with someone who has been drinking. When you are tired or distracted. While texting. If you have been using any mind-altering substances or drugs. Wear a helmet and other protective equipment during sports activities. If you have firearms in your house, make sure you follow all gun safety procedures. Seek help if you have been physically or sexually abused. What's next? Visit your health care provider once a year for an annual wellness visit. Ask your health care provider how often you should have your eyes and teeth checked. Stay up to date on all vaccines. This information is not intended to replace advice given to you by your health care provider. Make sure you discuss any questions you have with your health care provider. Document Revised: 01/31/2021 Document Reviewed: 01/31/2021 Elsevier Patient Education  2024 [employer] How to Do a Breast Self-Exam Doing breast self-exams can help you stay healthy. They're one way to know what's normal for your breasts. They can help you catch a problem while it's still small and can be treated. You need to: Check your breasts often. Tell your doctor about any changes. You should do breast self-exams even if you have breast implants. What you need: A mirror. A well-lit room. A pillow or other soft object. How to do a  breast self-exam Look for changes  Take off all the clothes above your waist. Stand in front of a mirror in a room with good lighting. Put your hands down at your sides. Compare your breasts in the mirror. Look for difference between them, such as: Differences in shape. Differences in size. Wrinkles, dips, and bumps in one breast and not the other. Look at each breast for skin changes, such as: Redness. Scaly spots. Spots where your skin is thicker. Dimpling. Open sores. Look for changes in your nipples, such as: Fluid coming out of a nipple. Fluid around a nipple. Bleeding. Dimpling. Redness. A nipple that looks pushed in or that has changed position. Feel for changes Lie on your back. Feel each breast. To do this: Pick a breast to feel. Place a pillow under the shoulder closest to that breast. Put the arm closest to that breast behind your head. Feel the breast using the hand of your other arm. Use the pads of your three middle fingers to make small circles starting near the nipple. Use light, medium, and firm pressure. Keep making circles, moving down over the breast. Stop when you feel your ribs. Start making circles with your fingers again, this time going up until you reach your collarbone. Then, make circles out across your breast and into your armpit area. Squeeze your nipple. Check for fluid and lumps. Do these steps again  to check your other breast. Sit or stand in the tub or shower. With soapy water on your skin, feel each breast the same way you did when you were lying down. Write down what you find Writing down what you find can help you keep track of what you want to tell your doctor. Write down: What's normal for each breast. Any changes you find. Write down: The kind of change. If your breast feels tender or painful. Any lump you find. Write down its size and where it is. When you last had your period. General tips If you're breastfeeding, the best time  to check your breasts is after you feed your baby or after you use a breast pump. If you get a period, the best time to check your breasts is 5-7 days after your period ends. With time, you'll get more used to doing the self-exam. You'll also start to know if there are changes in your breasts. Contact a doctor if: You see a change in the shape or size of your breasts or nipples. You see a change in the skin of your breast or nipples. You have fluid coming from your nipples that isn't normal. You find a new lump or thick area. You have breast pain. You have any concerns about your breast health. This information is not intended to replace advice given to you by your health care provider. Make sure you discuss any questions you have with your health care provider. Document Revised: 10/15/2023 Document Reviewed: 10/15/2023 Elsevier Patient Education  2025 [employer].

## 2024-08-27 NOTE — Telephone Encounter (Signed)
 PA request received for Tramadol  50 mg tablet. PA submitted through covermymeds and waiting on response.  Amber Livingston  (Key: [id number]) Rx #: [id number]

## 2024-08-30 ENCOUNTER — Encounter: Payer: Self-pay | Admitting: Certified Nurse Midwife

## 2024-08-30 ENCOUNTER — Ambulatory Visit (INDEPENDENT_AMBULATORY_CARE_PROVIDER_SITE_OTHER): Admitting: Certified Nurse Midwife

## 2024-08-30 VITALS — BP 107/74 | HR 78 | Ht 68.0 in | Wt 125.7 lb

## 2024-08-30 DIAGNOSIS — Z01419 Encounter for gynecological examination (general) (routine) without abnormal findings: Secondary | ICD-10-CM

## 2024-08-30 DIAGNOSIS — Z1231 Encounter for screening mammogram for malignant neoplasm of breast: Secondary | ICD-10-CM

## 2024-08-30 DIAGNOSIS — Z79890 Hormone replacement therapy (postmenopausal): Secondary | ICD-10-CM

## 2024-08-30 MED ORDER — MEDROXYPROGESTERONE ACETATE 2.5 MG PO TABS: 2.5000 mg | ORAL_TABLET | Freq: Every day | ORAL | 3 refills | Status: AC

## 2024-08-30 MED ORDER — ESTRADIOL 0.5 MG PO TABS: ORAL_TABLET | ORAL | 3 refills | Status: AC

## 2024-09-10 ENCOUNTER — Other Ambulatory Visit: Payer: Self-pay | Admitting: Podiatry

## 2024-09-10 DIAGNOSIS — G8929 Other chronic pain: Secondary | ICD-10-CM

## 2024-09-17 ENCOUNTER — Other Ambulatory Visit: Payer: Self-pay | Admitting: Adult Health

## 2024-09-17 ENCOUNTER — Other Ambulatory Visit (HOSPITAL_BASED_OUTPATIENT_CLINIC_OR_DEPARTMENT_OTHER): Payer: Self-pay

## 2024-09-17 DIAGNOSIS — G309 Alzheimer's disease, unspecified: Secondary | ICD-10-CM

## 2024-09-17 MED ORDER — STIOLTO RESPIMAT 2.5-2.5 MCG/ACT IN AERS: 2.0000 | INHALATION_SPRAY | Freq: Two times a day (BID) | RESPIRATORY_TRACT | 5 refills | Status: AC

## 2024-09-20 ENCOUNTER — Other Ambulatory Visit: Payer: Self-pay | Admitting: Internal Medicine

## 2024-09-20 DIAGNOSIS — I700 Atherosclerosis of aorta: Secondary | ICD-10-CM

## 2024-09-20 DIAGNOSIS — E782 Mixed hyperlipidemia: Secondary | ICD-10-CM

## 2024-09-29 ENCOUNTER — Ambulatory Visit: Admitting: Podiatry

## 2024-09-30 ENCOUNTER — Encounter: Admitting: Pediatrics

## 2024-10-06 ENCOUNTER — Ambulatory Visit: Admitting: Orthopedic Surgery

## 2024-12-21 ENCOUNTER — Ambulatory Visit (HOSPITAL_BASED_OUTPATIENT_CLINIC_OR_DEPARTMENT_OTHER): Admitting: Pulmonary Disease
